# Patient Record
Sex: Female | Born: 1965 | Race: White | Hispanic: No | Marital: Married | Smoking: Never smoker
Health system: Southern US, Community
[De-identification: ages and names within clinical notes are randomized; demographics above are authoritative.]

## PROBLEM LIST (undated history)

## (undated) DIAGNOSIS — D649 Anemia, unspecified: Secondary | ICD-10-CM

## (undated) DIAGNOSIS — I361 Nonrheumatic tricuspid (valve) insufficiency: Secondary | ICD-10-CM

## (undated) DIAGNOSIS — G4733 Obstructive sleep apnea (adult) (pediatric): Secondary | ICD-10-CM

## (undated) DIAGNOSIS — Z5189 Encounter for other specified aftercare: Secondary | ICD-10-CM

## (undated) DIAGNOSIS — I10 Essential (primary) hypertension: Secondary | ICD-10-CM

## (undated) DIAGNOSIS — G43909 Migraine, unspecified, not intractable, without status migrainosus: Secondary | ICD-10-CM

## (undated) DIAGNOSIS — G709 Myoneural disorder, unspecified: Secondary | ICD-10-CM

## (undated) DIAGNOSIS — C189 Malignant neoplasm of colon, unspecified: Secondary | ICD-10-CM

## (undated) DIAGNOSIS — G629 Polyneuropathy, unspecified: Secondary | ICD-10-CM

## (undated) DIAGNOSIS — F32A Depression, unspecified: Secondary | ICD-10-CM

## (undated) DIAGNOSIS — K760 Fatty (change of) liver, not elsewhere classified: Secondary | ICD-10-CM

## (undated) DIAGNOSIS — F4310 Post-traumatic stress disorder, unspecified: Secondary | ICD-10-CM

## (undated) DIAGNOSIS — G4730 Sleep apnea, unspecified: Secondary | ICD-10-CM

## (undated) DIAGNOSIS — K921 Melena: Secondary | ICD-10-CM

## (undated) DIAGNOSIS — Z973 Presence of spectacles and contact lenses: Secondary | ICD-10-CM

## (undated) DIAGNOSIS — R011 Cardiac murmur, unspecified: Secondary | ICD-10-CM

## (undated) DIAGNOSIS — Z9889 Other specified postprocedural states: Secondary | ICD-10-CM

## (undated) DIAGNOSIS — E559 Vitamin D deficiency, unspecified: Secondary | ICD-10-CM

## (undated) DIAGNOSIS — C20 Malignant neoplasm of rectum: Secondary | ICD-10-CM

## (undated) DIAGNOSIS — Z933 Colostomy status: Secondary | ICD-10-CM

## (undated) DIAGNOSIS — E890 Postprocedural hypothyroidism: Secondary | ICD-10-CM

## (undated) DIAGNOSIS — J45909 Unspecified asthma, uncomplicated: Secondary | ICD-10-CM

## (undated) HISTORY — PX: COLON SURGERY: SHX602

## (undated) HISTORY — PX: ABDOMINAL HYSTERECTOMY: SHX81

## (undated) HISTORY — DX: Encounter for other specified aftercare: Z51.89

## (undated) HISTORY — DX: Sleep apnea, unspecified: G47.30

## (undated) HISTORY — PX: PORTACATH PLACEMENT: SHX2246

## (undated) HISTORY — DX: Unspecified asthma, uncomplicated: J45.909

## (undated) HISTORY — DX: Malignant neoplasm of rectum: C20

## (undated) HISTORY — DX: Post-traumatic stress disorder, unspecified: F43.10

## (undated) HISTORY — DX: Other specified postprocedural states: Z98.890

## (undated) HISTORY — DX: Migraine, unspecified, not intractable, without status migrainosus: G43.909

## (undated) HISTORY — DX: Myoneural disorder, unspecified: G70.9

## (undated) HISTORY — DX: Malignant neoplasm of colon, unspecified: C18.9

## (undated) HISTORY — DX: Essential (primary) hypertension: I10

## (undated) HISTORY — DX: Melena: K92.1

---

## 1997-12-27 ENCOUNTER — Inpatient Hospital Stay (HOSPITAL_COMMUNITY): Admission: AD | Admit: 1997-12-27 | Discharge: 1997-12-29 | Payer: Self-pay | Admitting: *Deleted

## 1998-02-10 ENCOUNTER — Inpatient Hospital Stay (HOSPITAL_COMMUNITY): Admission: AD | Admit: 1998-02-10 | Discharge: 1998-02-12 | Payer: Self-pay | Admitting: Obstetrics and Gynecology

## 2002-03-16 ENCOUNTER — Other Ambulatory Visit: Admission: RE | Admit: 2002-03-16 | Discharge: 2002-03-16 | Payer: Self-pay | Admitting: Obstetrics and Gynecology

## 2010-07-22 HISTORY — PX: CHOLECYSTECTOMY: SHX55

## 2010-07-22 HISTORY — PX: ERCP: SHX60

## 2010-10-12 ENCOUNTER — Inpatient Hospital Stay: Payer: Self-pay | Admitting: Emergency Medicine

## 2010-10-17 ENCOUNTER — Ambulatory Visit: Payer: Self-pay | Admitting: General Surgery

## 2012-04-14 ENCOUNTER — Encounter: Payer: Self-pay | Admitting: Internal Medicine

## 2012-04-14 ENCOUNTER — Ambulatory Visit (INDEPENDENT_AMBULATORY_CARE_PROVIDER_SITE_OTHER): Payer: Self-pay | Admitting: Internal Medicine

## 2012-04-14 VITALS — BP 120/70 | HR 94 | Temp 98.5°F | Ht 65.0 in | Wt 211.5 lb

## 2012-04-14 DIAGNOSIS — K649 Unspecified hemorrhoids: Secondary | ICD-10-CM

## 2012-04-14 DIAGNOSIS — G4700 Insomnia, unspecified: Secondary | ICD-10-CM

## 2012-04-14 DIAGNOSIS — I10 Essential (primary) hypertension: Secondary | ICD-10-CM

## 2012-04-14 DIAGNOSIS — F329 Major depressive disorder, single episode, unspecified: Secondary | ICD-10-CM | POA: Insufficient documentation

## 2012-04-14 DIAGNOSIS — R197 Diarrhea, unspecified: Secondary | ICD-10-CM

## 2012-04-14 DIAGNOSIS — Z23 Encounter for immunization: Secondary | ICD-10-CM

## 2012-04-14 DIAGNOSIS — F341 Dysthymic disorder: Secondary | ICD-10-CM

## 2012-04-14 DIAGNOSIS — Z1239 Encounter for other screening for malignant neoplasm of breast: Secondary | ICD-10-CM

## 2012-04-14 DIAGNOSIS — Z0000 Encounter for general adult medical examination without abnormal findings: Secondary | ICD-10-CM

## 2012-04-14 LAB — COMPREHENSIVE METABOLIC PANEL
BUN: 12 mg/dL (ref 6–23)
CO2: 25 mEq/L (ref 19–32)
Calcium: 8.8 mg/dL (ref 8.4–10.5)
Chloride: 101 mEq/L (ref 96–112)
Creatinine, Ser: 0.7 mg/dL (ref 0.4–1.2)
GFR: 98.66 mL/min (ref >60.00)
Total Bilirubin: 0.6 mg/dL (ref 0.3–1.2)

## 2012-04-14 LAB — LIPID PANEL
Cholesterol: 129 mg/dL (ref 0–200)
HDL: 40.6 mg/dL (ref >39.00)
Triglycerides: 75 mg/dL (ref 0.0–149.0)
VLDL: 15 mg/dL (ref 0.0–40.0)

## 2012-04-14 LAB — MICROALBUMIN / CREATININE URINE RATIO: Creatinine,U: 205.1 mg/dL

## 2012-04-14 MED ORDER — ZOLPIDEM TARTRATE 10 MG PO TABS: 10.0000 mg | ORAL_TABLET | Freq: Every evening | ORAL | Status: DC | PRN

## 2012-04-14 MED ORDER — AMLODIPINE BESYLATE 10 MG PO TABS: 10.0000 mg | ORAL_TABLET | Freq: Every day | ORAL | Status: DC

## 2012-04-14 MED ORDER — ALPRAZOLAM 0.25 MG PO TABS: 0.2500 mg | ORAL_TABLET | Freq: Every evening | ORAL | Status: DC | PRN

## 2012-04-14 MED ORDER — CHOLESTYRAMINE 4 G PO PACK: 1.0000 | PACK | Freq: Three times a day (TID) | ORAL | Status: DC

## 2012-04-14 MED ORDER — BUPROPION HCL ER (XL) 150 MG PO TB24: 150.0000 mg | ORAL_TABLET | Freq: Every day | ORAL | Status: DC

## 2012-04-14 NOTE — Assessment & Plan Note (Signed)
Recommended that she continue use of MiraLax. Will stop cholestyramine. She will use pre-moistened toilet paper and Tucks pads as needed. She will use Preparation H as needed. If symptoms do not improve, would favor adding Analpram.

## 2012-04-14 NOTE — Assessment & Plan Note (Addendum)
Symptoms consistent with anxiety/depression and PTSD after going through child being diagnosed with malignancy. Will set up counseling. Will start wellbutrin and use xanax prn for episodes of extreme anxiety such as when pt must return to hospital for visits. Follow up 3 weeks.

## 2012-04-14 NOTE — Progress Notes (Signed)
Subjective:    Patient ID: Tammie Robinson, female    DOB: 10-27-1965, 46 y.o.   MRN: [id number]  HPI 46 year old female presents to establish care. She reports history of hypertension for which she has been treated with amlodipine. She reports blood pressure has been well-controlled. She notes that she recently lost approximately 21 pounds in her blood pressure has been lower. She questions whether she needs to continue on amlodipine. She denies any chest pain, headache, palpitations.  She is also concerned about ongoing anxiety and depression. She reports that her 79 year old daughter was diagnosed 3 years ago with malignancy. Her daughter survived malignancy and now is cared for with visits every 2 months at a local tertiary care Center. She reports significant dysphoric mood and increased anxiety, particularly around her daughter's visits back to the hospital. She is tearful in describing this. Her husband reports he is noted she is more irritable prior to her daughter's hospital visits. In the past, she took a Xanax to help control anxiety with some improvement. She is not currently undergoing counseling.  She also reports a history of chronic constipation and hemorrhoids. She recently started MiraLax in an effort to help improve regularity of bowel movements. She reports occasional bleeding of hemorrhoids noted on the toilet paper.  Outpatient Encounter Prescriptions as of 04/14/2012  Medication Sig Dispense Refill  . amLODipine (NORVASC) 10 MG tablet Take 1 tablet (10 mg total) by mouth daily.  90 tablet  3  . DISCONTD: amLODipine (NORVASC) 10 MG tablet Take 10 mg by mouth daily.      Marland Kitchen ALPRAZolam (XANAX) 0.25 MG tablet Take 1 tablet (0.25 mg total) by mouth at bedtime as needed for anxiety.  90 tablet  3  . buPROPion (WELLBUTRIN XL) 150 MG 24 hr tablet Take 1 tablet (150 mg total) by mouth daily.  30 tablet  3  . cholestyramine (QUESTRAN) 4 G packet Take 1 packet by mouth 3 (three) times daily  with meals.  60 each  12  . zolpidem (AMBIEN) 10 MG tablet Take 1 tablet (10 mg total) by mouth at bedtime as needed for sleep.  30 tablet  3  . DISCONTD: cholestyramine (QUESTRAN) 4 G packet Take 1 packet by mouth 3 (three) times daily with meals.  60 each  12   BP 120/70  Pulse 94  Temp 98.5 F (36.9 C) (Oral)  Ht 5\' 5"  (1.651 m)  Wt 211 lb 8 oz (95.936 kg)  BMI 35.20 kg/m2  SpO2 98%  LMP 03/31/2012  Review of Systems  Constitutional: Negative for fever, chills, appetite change, fatigue and unexpected weight change.  HENT: Negative for ear pain, congestion, sore throat, trouble swallowing, neck pain, voice change and sinus pressure.   Eyes: Negative for visual disturbance.  Respiratory: Negative for cough, shortness of breath, wheezing and stridor.   Cardiovascular: Negative for chest pain, palpitations and leg swelling.  Gastrointestinal: Positive for anal bleeding and rectal pain. Negative for nausea, vomiting, abdominal pain, diarrhea, constipation, blood in stool and abdominal distention.  Genitourinary: Negative for dysuria and flank pain.  Musculoskeletal: Negative for myalgias, arthralgias and gait problem.  Skin: Negative for color change and rash.  Neurological: Negative for dizziness and headaches.  Hematological: Negative for adenopathy. Does not bruise/bleed easily.  Psychiatric/Behavioral: Positive for disturbed wake/sleep cycle and dysphoric mood. Negative for suicidal ideas. The patient is nervous/anxious.        Objective:   Physical Exam  Constitutional: She is oriented to person, place, and time. She  appears well-developed and well-nourished. No distress.  HENT:  Head: Normocephalic and atraumatic.  Right Ear: External ear normal.  Left Ear: External ear normal.  Nose: Nose normal.  Mouth/Throat: Oropharynx is clear and moist. No oropharyngeal exudate.  Eyes: Conjunctivae normal are normal. Pupils are equal, round, and reactive to light. Right eye exhibits  no discharge. Left eye exhibits no discharge. No scleral icterus.  Neck: Normal range of motion. Neck supple. No tracheal deviation present. No thyromegaly present.  Cardiovascular: Normal rate, regular rhythm, normal heart sounds and intact distal pulses.  Exam reveals no gallop and no friction rub.   No murmur heard. Pulmonary/Chest: Effort normal and breath sounds normal. No respiratory distress. She has no wheezes. She has no rales. She exhibits no tenderness.  Abdominal: Soft. Bowel sounds are normal. She exhibits no distension. There is no tenderness.  Musculoskeletal: Normal range of motion. She exhibits no edema and no tenderness.  Lymphadenopathy:    She has no cervical adenopathy.  Neurological: She is alert and oriented to person, place, and time. No cranial nerve deficit. She exhibits normal muscle tone. Coordination normal.  Skin: Skin is warm and dry. No rash noted. She is not diaphoretic. No erythema. No pallor.  Psychiatric: Her speech is normal and behavior is normal. Judgment and thought content normal. Her mood appears anxious. Cognition and memory are normal. She exhibits a depressed mood.          Assessment & Plan:

## 2012-04-14 NOTE — Assessment & Plan Note (Signed)
BP well controlled on amlodipine. Will try tapering Amlodipine to 5mg  daily over next few weeks then recheck BP. Will check renal function and urine microalbumin with labs today.

## 2012-04-14 NOTE — Assessment & Plan Note (Signed)
Likely related to anxiety. Will treat anxiety as above. Will start ambien to help with sleep. Follow up 3 weeks.

## 2012-05-05 ENCOUNTER — Encounter: Payer: Self-pay | Admitting: Internal Medicine

## 2012-05-11 ENCOUNTER — Encounter: Payer: Self-pay | Admitting: Internal Medicine

## 2012-05-11 DIAGNOSIS — K649 Unspecified hemorrhoids: Secondary | ICD-10-CM

## 2012-05-11 DIAGNOSIS — F419 Anxiety disorder, unspecified: Secondary | ICD-10-CM

## 2012-05-11 MED ORDER — HYDROCORTISONE ACE-PRAMOXINE 2.5-1 % RE CREA: TOPICAL_CREAM | Freq: Three times a day (TID) | RECTAL | Status: DC

## 2012-05-11 MED ORDER — BUPROPION HCL ER (XL) 300 MG PO TB24: 300.0000 mg | ORAL_TABLET | Freq: Every day | ORAL | Status: DC

## 2012-05-18 ENCOUNTER — Encounter: Payer: Self-pay | Admitting: Internal Medicine

## 2012-05-26 ENCOUNTER — Other Ambulatory Visit (HOSPITAL_COMMUNITY)
Admission: RE | Admit: 2012-05-26 | Discharge: 2012-05-26 | Disposition: A | Discharge status: Home / Self Care | Payer: Self-pay | Source: Ambulatory Visit | Attending: Internal Medicine | Admitting: Internal Medicine

## 2012-05-26 ENCOUNTER — Encounter: Payer: Self-pay | Admitting: Internal Medicine

## 2012-05-26 ENCOUNTER — Ambulatory Visit (INDEPENDENT_AMBULATORY_CARE_PROVIDER_SITE_OTHER): Payer: Self-pay | Admitting: Internal Medicine

## 2012-05-26 VITALS — BP 138/82 | HR 78 | Temp 98.3°F | Ht 65.0 in | Wt 205.5 lb

## 2012-05-26 DIAGNOSIS — Z0000 Encounter for general adult medical examination without abnormal findings: Secondary | ICD-10-CM | POA: Insufficient documentation

## 2012-05-26 DIAGNOSIS — Z1151 Encounter for screening for human papillomavirus (HPV): Secondary | ICD-10-CM | POA: Insufficient documentation

## 2012-05-26 DIAGNOSIS — F419 Anxiety disorder, unspecified: Secondary | ICD-10-CM

## 2012-05-26 DIAGNOSIS — Z309 Encounter for contraceptive management, unspecified: Secondary | ICD-10-CM

## 2012-05-26 DIAGNOSIS — Z01419 Encounter for gynecological examination (general) (routine) without abnormal findings: Secondary | ICD-10-CM | POA: Insufficient documentation

## 2012-05-26 DIAGNOSIS — IMO0001 Reserved for inherently not codable concepts without codable children: Secondary | ICD-10-CM

## 2012-05-26 DIAGNOSIS — F341 Dysthymic disorder: Secondary | ICD-10-CM

## 2012-05-26 LAB — HM MAMMOGRAPHY

## 2012-05-26 MED ORDER — LEVONORGESTREL-ETHINYL ESTRAD 0.1-20 MG-MCG PO TABS: 1.0000 | ORAL_TABLET | Freq: Every day | ORAL | Status: DC

## 2012-05-26 MED ORDER — FLUOXETINE HCL 20 MG PO TABS: 20.0000 mg | ORAL_TABLET | Freq: Every day | ORAL | Status: DC

## 2012-05-26 NOTE — Assessment & Plan Note (Signed)
Will start low-dose oral contraceptive pill. We discussed potential risk and benefits of oral contraceptives. Followup as needed.

## 2012-05-26 NOTE — Assessment & Plan Note (Signed)
General medical exam is normal today except as noted with vaginal dryness on pelvic exam. Patient would like to try oral contraceptive pill to help with both vaginal dryness and for contraception. Will start low-dose monophasic pill. Patient will call if any problems with the medication. Other health maintenance is up to date. Recommended annual screening for skin cancer with dermatologist but patient would like to defer for now until health insurance in place. Followup in one month.

## 2012-05-26 NOTE — Progress Notes (Signed)
Subjective:    Patient ID: Tammie Robinson, female    DOB: 1966/04/24, 46 y.o.   MRN: [id number]  HPI 46 year old female with history of anxiety/depression presents for annual exam. She reports that she is generally doing well. In regards to anxiety/depression, she reports that symptoms are well controlled with Wellbutrin however she is unable to afford this medication as it is over $120 per month. She would like to try another medication to help with symptoms. Aside from this, she reports she is feeling well. She denies any new concerns today.  Outpatient Encounter Prescriptions as of 05/26/2012  Medication Sig Dispense Refill  . ALPRAZolam (XANAX) 0.25 MG tablet Take 1 tablet (0.25 mg total) by mouth at bedtime as needed for anxiety.  90 tablet  3  . amLODipine (NORVASC) 10 MG tablet Take 1 tablet (10 mg total) by mouth daily.  90 tablet  3  . cholestyramine (QUESTRAN) 4 G packet Take 1 packet by mouth 3 (three) times daily with meals.  60 each  12  . hydrocortisone-pramoxine (ANALPRAM-HC) 2.5-1 % rectal cream Place rectally 3 (three) times daily.  30 g  0  . zolpidem (AMBIEN) 10 MG tablet Take 10 mg by mouth at bedtime as needed.      . [DISCONTINUED] buPROPion (WELLBUTRIN XL) 300 MG 24 hr tablet Take 1 tablet (300 mg total) by mouth daily.  30 tablet  3  . [DISCONTINUED] zolpidem (AMBIEN) 10 MG tablet Take 1 tablet (10 mg total) by mouth at bedtime as needed for sleep.  30 tablet  3  . FLUoxetine (PROZAC) 20 MG tablet Take 1 tablet (20 mg total) by mouth daily.  30 tablet  6  . levonorgestrel-ethinyl estradiol (AVIANE) 0.1-20 MG-MCG tablet Take 1 tablet by mouth daily.  1 Package  11  BP 138/82  Pulse 78  Temp 98.3 F (36.8 C) (Oral)  Ht 5\' 5"  (1.651 m)  Wt 205 lb 8 oz (93.214 kg)  BMI 34.20 kg/m2  SpO2 99%  LMP 05/15/2012   Review of Systems  Constitutional: Negative for fever, chills, appetite change, fatigue and unexpected weight change.  HENT: Negative for ear pain, congestion,  sore throat, trouble swallowing, neck pain, voice change and sinus pressure.   Eyes: Negative for visual disturbance.  Respiratory: Negative for cough, shortness of breath, wheezing and stridor.   Cardiovascular: Negative for chest pain, palpitations and leg swelling.  Gastrointestinal: Negative for nausea, vomiting, abdominal pain, diarrhea, constipation, blood in stool, abdominal distention and anal bleeding.  Genitourinary: Negative for dysuria and flank pain.  Musculoskeletal: Negative for myalgias, arthralgias and gait problem.  Skin: Negative for color change and rash.  Neurological: Negative for dizziness and headaches.  Hematological: Negative for adenopathy. Does not bruise/bleed easily.  Psychiatric/Behavioral: Negative for suicidal ideas, sleep disturbance and dysphoric mood. The patient is nervous/anxious.        Objective:   Physical Exam  Constitutional: She is oriented to person, place, and time. She appears well-developed and well-nourished. No distress.  HENT:  Head: Normocephalic and atraumatic.  Right Ear: External ear normal.  Left Ear: External ear normal.  Nose: Nose normal.  Mouth/Throat: Oropharynx is clear and moist. No oropharyngeal exudate.  Eyes: Conjunctivae normal are normal. Pupils are equal, round, and reactive to light. Right eye exhibits no discharge. Left eye exhibits no discharge. No scleral icterus.  Neck: Normal range of motion. Neck supple. No tracheal deviation present. No thyromegaly present.  Cardiovascular: Normal rate, regular rhythm, normal heart sounds and intact distal  pulses.  Exam reveals no gallop and no friction rub.   No murmur heard. Pulmonary/Chest: Effort normal and breath sounds normal. No respiratory distress. She has no wheezes. She has no rales. She exhibits no tenderness.  Abdominal: Soft. Bowel sounds are normal. She exhibits no distension and no mass. There is no tenderness. There is no rebound and no guarding.  Genitourinary:  Rectum normal, vagina normal and uterus normal. No breast swelling, tenderness, discharge or bleeding. Pelvic exam was performed with patient supine. There is no rash, tenderness or lesion on the right labia. There is no rash, tenderness or lesion on the left labia. Uterus is not enlarged and not tender. Cervix exhibits no motion tenderness, no discharge and no friability. Right adnexum displays no mass, no tenderness and no fullness. Left adnexum displays no mass, no tenderness and no fullness. No erythema or tenderness around the vagina. No vaginal discharge found.       Vaginal dryness noted, but no erythema/friability  Musculoskeletal: Normal range of motion. She exhibits no edema and no tenderness.  Lymphadenopathy:    She has no cervical adenopathy.  Neurological: She is alert and oriented to person, place, and time. No cranial nerve deficit. She exhibits normal muscle tone. Coordination normal.  Skin: Skin is warm and dry. No rash noted. She is not diaphoretic. No erythema. No pallor.  Psychiatric: She has a normal mood and affect. Her behavior is normal. Judgment and thought content normal.          Assessment & Plan:

## 2012-05-26 NOTE — Assessment & Plan Note (Signed)
Symptoms are well controlled with Wellbutrin however medication is cost prohibitive. Will try changing to Prozac. Patient will followup in one month for recheck. She will call sooner if any problems with medication.

## 2012-06-25 ENCOUNTER — Ambulatory Visit (INDEPENDENT_AMBULATORY_CARE_PROVIDER_SITE_OTHER): Payer: Self-pay | Admitting: Internal Medicine

## 2012-06-25 ENCOUNTER — Encounter: Payer: Self-pay | Admitting: Internal Medicine

## 2012-06-25 VITALS — BP 134/80 | HR 79 | Temp 98.3°F | Resp 16 | Wt 203.5 lb

## 2012-06-25 DIAGNOSIS — F329 Major depressive disorder, single episode, unspecified: Secondary | ICD-10-CM

## 2012-06-25 DIAGNOSIS — M5430 Sciatica, unspecified side: Secondary | ICD-10-CM

## 2012-06-25 DIAGNOSIS — R87615 Unsatisfactory cytologic smear of cervix: Secondary | ICD-10-CM | POA: Insufficient documentation

## 2012-06-25 DIAGNOSIS — F341 Dysthymic disorder: Secondary | ICD-10-CM

## 2012-06-25 DIAGNOSIS — M5431 Sciatica, right side: Secondary | ICD-10-CM | POA: Insufficient documentation

## 2012-06-25 MED ORDER — ALPRAZOLAM 0.25 MG PO TABS: 0.2500 mg | ORAL_TABLET | Freq: Three times a day (TID) | ORAL | Status: DC | PRN

## 2012-06-25 MED ORDER — FLUOXETINE HCL 40 MG PO CAPS: 40.0000 mg | ORAL_CAPSULE | Freq: Every day | ORAL | Status: DC

## 2012-06-25 NOTE — Assessment & Plan Note (Signed)
Symptoms consistent with right sided sciatic pain. Discussed stretching exercises and use of Ibuprofen 800mg  po tid prn. Also discussed use of prednisone if symptoms are persistent. Follow up 1 month and prn.

## 2012-06-25 NOTE — Progress Notes (Signed)
Subjective:    Patient ID: Tammie Robinson, female    DOB: October 28, 1965, 46 y.o.   MRN: [id number]  HPI 46 year old female with history of hypertension, anxiety/depression presents for followup. At her last visit, we started Prozac in effort to help with symptoms of anxiety and depression. She reports no change in symptoms with use of this medication. She denies any side effects from the medication.  Only new concern today is several week history of intermittent pain in the right posterior upper thigh which radiates down her right thigh. She reports this was diagnosed as sciatic pain in the past. She is not currently taking any medication except for occasional Advil for pain with some improvement. She denies any weakness in her leg, change in sensation in her leg.  Outpatient Encounter Prescriptions as of 06/25/2012  Medication Sig Dispense Refill  . ALPRAZolam (XANAX) 0.25 MG tablet Take 1 tablet (0.25 mg total) by mouth 3 (three) times daily as needed for anxiety.  90 tablet  3  . amLODipine (NORVASC) 10 MG tablet Take 1 tablet (10 mg total) by mouth daily.  90 tablet  3  . cholestyramine (QUESTRAN) 4 G packet Take 1 packet by mouth 3 (three) times daily with meals.  60 each  12  . hydrocortisone-pramoxine (ANALPRAM-HC) 2.5-1 % rectal cream Place rectally 3 (three) times daily.  30 g  0  . levonorgestrel-ethinyl estradiol (AVIANE) 0.1-20 MG-MCG tablet Take 1 tablet by mouth daily.  1 Package  11  . zolpidem (AMBIEN) 10 MG tablet Take 10 mg by mouth at bedtime as needed.      . [DISCONTINUED] ALPRAZolam (XANAX) 0.25 MG tablet Take 1 tablet (0.25 mg total) by mouth at bedtime as needed for anxiety.  90 tablet  3  . [DISCONTINUED] FLUoxetine (PROZAC) 20 MG tablet Take 1 tablet (20 mg total) by mouth daily.  30 tablet  6  . FLUoxetine (PROZAC) 40 MG capsule Take 1 capsule (40 mg total) by mouth daily.  30 capsule  6   BP 134/80  Pulse 79  Temp 98.3 F (36.8 C) (Oral)  Resp 16  Wt 203 lb 8 oz  (92.307 kg)  LMP 06/18/2012  Review of Systems  Constitutional: Negative for fever, chills, appetite change, fatigue and unexpected weight change.  HENT: Negative for ear pain, congestion, sore throat, trouble swallowing, neck pain, voice change and sinus pressure.   Eyes: Negative for visual disturbance.  Respiratory: Negative for cough, shortness of breath, wheezing and stridor.   Cardiovascular: Negative for chest pain, palpitations and leg swelling.  Gastrointestinal: Negative for nausea, vomiting, abdominal pain, diarrhea, constipation, blood in stool, abdominal distention and anal bleeding.  Genitourinary: Negative for dysuria and flank pain.  Musculoskeletal: Positive for myalgias and arthralgias. Negative for gait problem.  Skin: Negative for color change and rash.  Neurological: Negative for dizziness and headaches.  Hematological: Negative for adenopathy. Does not bruise/bleed easily.  Psychiatric/Behavioral: Positive for dysphoric mood. Negative for suicidal ideas and sleep disturbance. The patient is nervous/anxious.        Objective:   Physical Exam  Constitutional: She is oriented to person, place, and time. She appears well-developed and well-nourished. No distress.  HENT:  Head: Normocephalic and atraumatic.  Right Ear: External ear normal.  Left Ear: External ear normal.  Nose: Nose normal.  Mouth/Throat: Oropharynx is clear and moist. No oropharyngeal exudate.  Eyes: Conjunctivae normal are normal. Pupils are equal, round, and reactive to light. Right eye exhibits no discharge. Left eye exhibits no  discharge. No scleral icterus.  Neck: Normal range of motion. Neck supple. No tracheal deviation present. No thyromegaly present.  Cardiovascular: Normal rate, regular rhythm, normal heart sounds and intact distal pulses.  Exam reveals no gallop and no friction rub.   No murmur heard. Pulmonary/Chest: Effort normal and breath sounds normal. No respiratory distress. She has  no wheezes. She has no rales. She exhibits no tenderness.  Musculoskeletal: Normal range of motion. She exhibits no edema and no tenderness.       Back:  Lymphadenopathy:    She has no cervical adenopathy.  Neurological: She is alert and oriented to person, place, and time. No cranial nerve deficit. She exhibits normal muscle tone. Coordination normal.  Skin: Skin is warm and dry. No rash noted. She is not diaphoretic. No erythema. No pallor.  Psychiatric: She has a normal mood and affect. Her behavior is normal. Judgment and thought content normal.          Assessment & Plan:

## 2012-06-25 NOTE — Assessment & Plan Note (Signed)
Symptoms unchanged on Fluoxetine 20mg  dose. Will try increasing to 40mg  daily. Follow up in 1 month.

## 2012-06-25 NOTE — Assessment & Plan Note (Signed)
Recent PAP had insufficient cells for evaluation. However, HPV was negative. Will plan to repeat PAP at visit in Jan 2014

## 2012-08-10 ENCOUNTER — Ambulatory Visit: Payer: Self-pay | Admitting: Internal Medicine

## 2012-09-09 ENCOUNTER — Other Ambulatory Visit: Payer: Self-pay | Admitting: Internal Medicine

## 2012-09-11 ENCOUNTER — Encounter: Payer: Self-pay | Admitting: Internal Medicine

## 2012-09-17 MED ORDER — ZOLPIDEM TARTRATE 10 MG PO TABS: 10.0000 mg | ORAL_TABLET | Freq: Every evening | ORAL | Status: DC | PRN

## 2012-09-18 NOTE — Telephone Encounter (Signed)
faxed

## 2012-11-20 ENCOUNTER — Telehealth: Payer: Self-pay | Admitting: *Deleted

## 2012-11-20 ENCOUNTER — Encounter: Payer: Self-pay | Admitting: Internal Medicine

## 2012-11-20 DIAGNOSIS — F329 Major depressive disorder, single episode, unspecified: Secondary | ICD-10-CM

## 2012-11-20 MED ORDER — ALPRAZOLAM 0.25 MG PO TABS: 0.2500 mg | ORAL_TABLET | Freq: Three times a day (TID) | ORAL | Status: DC | PRN

## 2012-11-20 NOTE — Telephone Encounter (Signed)
Ok to refill patient Xanax. 

## 2012-11-20 NOTE — Telephone Encounter (Signed)
Rx printed, signed and faxed.

## 2012-11-20 NOTE — Telephone Encounter (Signed)
Fine to refill x1 

## 2013-02-08 ENCOUNTER — Other Ambulatory Visit: Payer: Self-pay | Admitting: *Deleted

## 2013-02-08 ENCOUNTER — Encounter: Payer: Self-pay | Admitting: Internal Medicine

## 2013-02-08 DIAGNOSIS — F329 Major depressive disorder, single episode, unspecified: Secondary | ICD-10-CM

## 2013-02-09 MED ORDER — ALPRAZOLAM 0.25 MG PO TABS: 0.2500 mg | ORAL_TABLET | Freq: Three times a day (TID) | ORAL | Status: DC | PRN

## 2013-03-31 ENCOUNTER — Other Ambulatory Visit: Payer: Self-pay | Admitting: *Deleted

## 2013-04-01 MED ORDER — ZOLPIDEM TARTRATE 10 MG PO TABS: 10.0000 mg | ORAL_TABLET | Freq: Every evening | ORAL | Status: DC | PRN

## 2013-05-02 ENCOUNTER — Encounter: Payer: Self-pay | Admitting: Internal Medicine

## 2013-05-02 DIAGNOSIS — F329 Major depressive disorder, single episode, unspecified: Secondary | ICD-10-CM

## 2013-05-03 ENCOUNTER — Other Ambulatory Visit: Payer: Self-pay | Admitting: Internal Medicine

## 2013-05-03 ENCOUNTER — Other Ambulatory Visit: Payer: Self-pay | Admitting: *Deleted

## 2013-05-03 DIAGNOSIS — F329 Major depressive disorder, single episode, unspecified: Secondary | ICD-10-CM

## 2013-05-03 NOTE — Telephone Encounter (Signed)
Needs a follow up visit, as has not been seen in >6 months.

## 2013-05-03 NOTE — Telephone Encounter (Signed)
Ok to refill??      Xanax 

## 2013-05-03 NOTE — Telephone Encounter (Signed)
Eprescribed.

## 2013-05-06 MED ORDER — ALPRAZOLAM 0.25 MG PO TABS: 0.2500 mg | ORAL_TABLET | Freq: Three times a day (TID) | ORAL | Status: DC | PRN

## 2013-05-27 ENCOUNTER — Other Ambulatory Visit: Payer: Self-pay

## 2013-06-14 ENCOUNTER — Encounter: Payer: Self-pay | Admitting: Internal Medicine

## 2013-06-14 DIAGNOSIS — F329 Major depressive disorder, single episode, unspecified: Secondary | ICD-10-CM

## 2013-06-15 MED ORDER — ALPRAZOLAM 0.25 MG PO TABS: 0.2500 mg | ORAL_TABLET | Freq: Three times a day (TID) | ORAL | Status: DC | PRN

## 2013-06-15 MED ORDER — FLUOXETINE HCL 40 MG PO CAPS: ORAL_CAPSULE | ORAL | Status: DC

## 2013-07-06 ENCOUNTER — Other Ambulatory Visit: Payer: Self-pay | Admitting: Internal Medicine

## 2013-07-30 ENCOUNTER — Other Ambulatory Visit: Payer: Self-pay | Admitting: Internal Medicine

## 2013-07-30 NOTE — Telephone Encounter (Signed)
Last visit 07/05/12, no appointment scheduled

## 2013-08-02 ENCOUNTER — Other Ambulatory Visit: Payer: Self-pay | Admitting: Internal Medicine

## 2013-08-04 ENCOUNTER — Other Ambulatory Visit: Payer: Self-pay | Admitting: Internal Medicine

## 2013-12-20 ENCOUNTER — Ambulatory Visit: Payer: Self-pay | Admitting: Hematology and Oncology

## 2013-12-20 HISTORY — PX: PORTACATH PLACEMENT: SHX2246

## 2013-12-20 HISTORY — PX: COLONOSCOPY WITH ESOPHAGOGASTRODUODENOSCOPY (EGD): SHX5779

## 2013-12-22 ENCOUNTER — Telehealth: Payer: Self-pay

## 2013-12-22 ENCOUNTER — Ambulatory Visit (INDEPENDENT_AMBULATORY_CARE_PROVIDER_SITE_OTHER): Payer: Medicaid Other | Admitting: Adult Health

## 2013-12-22 ENCOUNTER — Encounter: Payer: Self-pay | Admitting: Adult Health

## 2013-12-22 ENCOUNTER — Inpatient Hospital Stay: Payer: Self-pay | Admitting: Specialist

## 2013-12-22 VITALS — BP 118/70 | HR 102 | Temp 98.3°F | Resp 16 | Ht 65.0 in | Wt 192.0 lb

## 2013-12-22 DIAGNOSIS — C20 Malignant neoplasm of rectum: Secondary | ICD-10-CM

## 2013-12-22 DIAGNOSIS — R509 Fever, unspecified: Secondary | ICD-10-CM | POA: Insufficient documentation

## 2013-12-22 DIAGNOSIS — R300 Dysuria: Secondary | ICD-10-CM

## 2013-12-22 HISTORY — DX: Malignant neoplasm of rectum: C20

## 2013-12-22 LAB — POCT URINALYSIS DIPSTICK
Bilirubin, UA: NEGATIVE
GLUCOSE UA: NEGATIVE
Ketones, UA: NEGATIVE
NITRITE UA: POSITIVE
PROTEIN UA: 100
Spec Grav, UA: 1.02
UROBILINOGEN UA: 0.2
pH, UA: 6

## 2013-12-22 LAB — CBC WITH DIFFERENTIAL/PLATELET
BASOS PCT: 0.1 % (ref 0.0–3.0)
Basophil #: 0 10*3/uL (ref 0.0–0.1)
Basophil %: 0.1 %
Basophils Absolute: 0 10*3/uL (ref 0.0–0.1)
EOS ABS: 0 10*3/uL (ref 0.0–0.7)
EOS PCT: 0.1 %
Eosinophil #: 0 10*3/uL (ref 0.0–0.7)
Eosinophils Relative: 0.1 % (ref 0.0–5.0)
HCT: 14.5 % — CL (ref 36.0–46.0)
HCT: 14.8 % — AB (ref 35.0–47.0)
HGB: 4.1 g/dL — CL (ref 12.0–16.0)
Hemoglobin: 4.2 g/dL — CL (ref 12.0–15.0)
LYMPHS ABS: 1 10*3/uL (ref 0.7–4.0)
LYMPHS ABS: 0.7 10*3/uL — AB (ref 1.0–3.6)
Lymphocyte %: 4 %
Lymphocytes Relative: 4.9 % — ABNORMAL LOW (ref 12.0–46.0)
MCH: 14.9 pg — ABNORMAL LOW (ref 26.0–34.0)
MCHC: 27.9 g/dL — AB (ref 32.0–36.0)
MCV: 53 fL — AB (ref 80–100)
MONO ABS: 1.4 10*3/uL — AB (ref 0.1–1.0)
MONO ABS: 1.2 x10 3/mm — AB (ref 0.2–0.9)
MONOS PCT: 6.6 %
Monocytes Relative: 7.1 % (ref 3.0–12.0)
Neutro Abs: 17.6 10*3/uL — ABNORMAL HIGH (ref 1.4–7.7)
Neutrophil #: 16.4 10*3/uL — ABNORMAL HIGH (ref 1.4–6.5)
Neutrophil %: 89.2 %
PLATELETS: 719 10*3/uL — AB (ref 150–440)
Platelets: 757 10*3/uL — ABNORMAL HIGH (ref 150.0–400.0)
RBC: 2.68 Mil/uL — ABNORMAL LOW (ref 3.87–5.11)
RBC: 2.79 10*6/uL — ABNORMAL LOW (ref 3.80–5.20)
RDW: 19.7 % — ABNORMAL HIGH (ref 11.5–15.5)
RDW: 19.1 % — AB (ref 11.5–14.5)
WBC: 20 10*3/uL (ref 4.0–10.5)
WBC: 18.4 10*3/uL — ABNORMAL HIGH (ref 3.6–11.0)

## 2013-12-22 LAB — COMPREHENSIVE METABOLIC PANEL
ALBUMIN: 1.6 g/dL — AB (ref 3.4–5.0)
ALK PHOS: 113 U/L
ALT: 10 U/L (ref 0–35)
ALT: 10 U/L — AB (ref 12–78)
AST: 15 U/L (ref 0–37)
AST: 18 U/L (ref 15–37)
Albumin: 1.7 g/dL — ABNORMAL LOW (ref 3.5–5.2)
Alkaline Phosphatase: 85 U/L (ref 39–117)
Anion Gap: 8 (ref 7–16)
BUN: 7 mg/dL (ref 6–23)
BUN: 7 mg/dL (ref 7–18)
Bilirubin,Total: 0.5 mg/dL (ref 0.2–1.0)
CHLORIDE: 102 mmol/L (ref 98–107)
CO2: 25 mEq/L (ref 19–32)
CO2: 26 mmol/L (ref 21–32)
CREATININE: 0.7 mg/dL (ref 0.4–1.2)
Calcium, Total: 7.4 mg/dL — ABNORMAL LOW (ref 8.5–10.1)
Calcium: 7.1 mg/dL — ABNORMAL LOW (ref 8.4–10.5)
Chloride: 98 mEq/L (ref 96–112)
Creatinine: 0.92 mg/dL (ref 0.60–1.30)
EGFR (African American): >60
EGFR (Non-African Amer.): >60
GFR: 94.73 mL/min (ref >60.00)
GLUCOSE: 92 mg/dL (ref 70–99)
Glucose: 98 mg/dL (ref 65–99)
OSMOLALITY: 270 (ref 275–301)
POTASSIUM: 3.1 mmol/L — AB (ref 3.5–5.1)
Potassium: 3.2 mEq/L — ABNORMAL LOW (ref 3.5–5.1)
Sodium: 131 mEq/L — ABNORMAL LOW (ref 135–145)
Sodium: 136 mmol/L (ref 136–145)
Total Bilirubin: 0.8 mg/dL (ref 0.2–1.2)
Total Protein: 5.5 g/dL — ABNORMAL LOW (ref 6.0–8.3)
Total Protein: 6.4 g/dL (ref 6.4–8.2)

## 2013-12-22 LAB — IRON AND TIBC
Iron Bind.Cap.(Total): 185 ug/dL — ABNORMAL LOW
Iron Saturation: 5 %
Iron: 10 ug/dL — ABNORMAL LOW
Unbound Iron-Bind.Cap.: 175 ug/dL

## 2013-12-22 LAB — URINALYSIS, COMPLETE
Bilirubin,UR: NEGATIVE
Glucose,UR: NEGATIVE mg/dL (ref 0–75)
Nitrite: POSITIVE
Ph: 5 (ref 4.5–8.0)
RBC,UR: 11 /HPF (ref 0–5)
Specific Gravity: 1.015 (ref 1.003–1.030)
Squamous Epithelial: 6
WBC UR: 858 /HPF (ref 0–5)

## 2013-12-22 LAB — FOLATE: Folic Acid: 3.9 ng/mL

## 2013-12-22 LAB — HEMOGLOBIN: HGB: 5.4 g/dL — ABNORMAL LOW

## 2013-12-22 LAB — FERRITIN: Ferritin (ARMC): 31 ng/mL (ref 8–388)

## 2013-12-22 MED ORDER — CIPROFLOXACIN HCL 250 MG PO TABS: 250.0000 mg | ORAL_TABLET | Freq: Two times a day (BID) | ORAL | Status: DC

## 2013-12-22 MED ORDER — PHENAZOPYRIDINE HCL 200 MG PO TABS: 200.0000 mg | ORAL_TABLET | Freq: Three times a day (TID) | ORAL | Status: DC | PRN

## 2013-12-22 NOTE — Telephone Encounter (Signed)
Spoke to patient, notified her that some of her labs were critical and told the patient to go to the ED [facility]) right now. Patient verbalized understanding. Called [facility] ED to notify charge nurse [facility]) patient is on the way and the reason for her visit. Also faxed Raquel's notes from today's office visit and Lab results.

## 2013-12-22 NOTE — Progress Notes (Signed)
Pre visit review using our clinic review tool, if applicable. No additional management support is needed unless otherwise documented below in the visit note. 

## 2013-12-22 NOTE — Progress Notes (Signed)
Patient ID: Tammie Robinson, female   DOB: Nov 07, 1965, 48 y.o.   MRN: [id number]   Subjective:    Patient ID: Tammie Robinson, female    DOB: [birth year], 48 y.o.   MRN: [id number]  HPI  48 y/o female pt of Dr. Gilford Rile who was last seen in clinic in 2013 presents with report of intermittent fever, chills, post nasal drip that began ~ 12/12/13. She improved for a while but reports symptoms returned this past weekend. On Monday she began to experience dysuria. Denies shortness of breath, chest pain. She reports fever but has not taken her temp. States she feels when she has a fever. Denies any insect bites. Denies n/v/d. Denies abdominal pain. Denies blood in urine or stool. Appetite is decreased. Taking liquids well.   Past Medical History  Diagnosis Date  . Blood in stool   . Hypertension   . Migraine   . Asthma     seasonal,never hospitalized    Current Outpatient Prescriptions on File Prior to Visit  Medication Sig Dispense Refill  . amLODipine (NORVASC) 10 MG tablet take 1 tablet by mouth once daily  90 tablet  0  . ALPRAZolam (XANAX) 0.25 MG tablet Take 1 tablet (0.25 mg total) by mouth 3 (three) times daily as needed for anxiety.  90 tablet  0  . cholestyramine (QUESTRAN) 4 G packet Take 1 packet by mouth 3 (three) times daily with meals.  60 each  12  . FLUoxetine (PROZAC) 40 MG capsule Take one capsule by mouth one time daily  30 capsule  1  . hydrocortisone-pramoxine (ANALPRAM-HC) 2.5-1 % rectal cream Place rectally 3 (three) times daily.  30 g  0  . levonorgestrel-ethinyl estradiol (AVIANE) 0.1-20 MG-MCG tablet Take 1 tablet by mouth daily.  1 Package  11  . zolpidem (AMBIEN) 10 MG tablet Take 1 tablet (10 mg total) by mouth at bedtime as needed.  30 tablet  3   No current facility-administered medications on file prior to visit.     Review of Systems  Constitutional: Positive for fever and chills.  HENT: Positive for postnasal drip.   Respiratory: Positive for cough. Negative for  chest tightness, shortness of breath and wheezing.   Cardiovascular: Negative for chest pain.  Gastrointestinal: Negative for nausea, vomiting, abdominal pain, diarrhea and blood in stool.  Genitourinary: Positive for dysuria. Negative for hematuria, flank pain, difficulty urinating and pelvic pain.  Neurological: Positive for weakness. Negative for light-headedness and headaches.  Psychiatric/Behavioral: Negative for behavioral problems. The patient is not nervous/anxious.        Objective:  BP 118/70  Pulse 102  Temp(Src) 98.3 F (36.8 C) (Oral)  Resp 16  Ht 5\' 5"  (1.651 m)  Wt 192 lb (87.091 kg)  BMI 31.95 kg/m2  SpO2 97%    Physical Exam  Constitutional: She is oriented to person, place, and time.  Appears acutely ill. Very pale  HENT:  Head: Normocephalic and atraumatic.  Neck: Neck supple.  Cardiovascular: Normal rate, regular rhythm and normal heart sounds.  Exam reveals no gallop.   No murmur heard. Pulmonary/Chest: Effort normal and breath sounds normal. No respiratory distress. She has no wheezes. She has no rales.  Abdominal: Soft. Bowel sounds are normal. She exhibits no distension and no mass. There is no tenderness. There is no rebound and no guarding.  Musculoskeletal: Normal range of motion.  Neurological: She is alert and oriented to person, place, and time.  Skin:  Lower back mottled skin. Pt reports that  it is from heating pad  Psychiatric: She has a normal mood and affect. Her behavior is normal. Judgment and thought content normal.       Assessment & Plan:   1. Fever Subjective reports of fever. None in clinic. Pt appears acutely ill and very pale. Will check labs. Follow up with Dr. Gilford Rile in ~ 1 week or sooner if necessary. - CBC with Differential - Comprehensive metabolic panel  2. Dysuria UA positive for nitrites, blood and leukocytes. Send urine for culture. Start Cipro as directed. Pyridium for bladder pain/spasms. Push fluids. - POCT  urinalysis dipstick

## 2013-12-22 NOTE — Patient Instructions (Signed)
  Start cipro 250 mg twice daily for 7 days. Take with food if it upsets your stomach.  Pyridium for bladder pain. This will turn your urine orange.  Call if no improvement within 4-5 days or sooner if necessary.  Drink plenty of fluids and rest.  I will call you with results from blood work and culture once they are available.

## 2013-12-23 ENCOUNTER — Telehealth: Payer: Self-pay | Admitting: *Deleted

## 2013-12-23 LAB — CBC WITH DIFFERENTIAL/PLATELET
BASOS PCT: 0.4 %
Basophil #: 0.1 10*3/uL (ref 0.0–0.1)
EOS PCT: 0.1 %
Eosinophil #: 0 10*3/uL (ref 0.0–0.7)
HCT: 23.4 % — ABNORMAL LOW (ref 35.0–47.0)
HGB: 7.2 g/dL — AB (ref 12.0–16.0)
LYMPHS ABS: 0.7 10*3/uL — AB (ref 1.0–3.6)
LYMPHS PCT: 3.7 %
MCH: 20.3 pg — AB (ref 26.0–34.0)
MCHC: 30.9 g/dL — AB (ref 32.0–36.0)
MCV: 66 fL — AB (ref 80–100)
MONO ABS: 1.3 x10 3/mm — AB (ref 0.2–0.9)
Monocyte %: 7.1 %
Neutrophil #: 16.1 10*3/uL — ABNORMAL HIGH (ref 1.4–6.5)
Neutrophil %: 88.7 %
PLATELETS: 567 10*3/uL — AB (ref 150–440)
RBC: 3.56 10*6/uL — AB (ref 3.80–5.20)
RDW: 32.1 % — AB (ref 11.5–14.5)
WBC: 18.1 10*3/uL — ABNORMAL HIGH (ref 3.6–11.0)

## 2013-12-23 NOTE — Telephone Encounter (Signed)
Pt in hospital, spoke with Dr. Verdell Carmine who is seeing the patient for fever, requested urine culture to be faxed to [place] [phone] as soon as it is resulted

## 2013-12-24 LAB — CBC WITH DIFFERENTIAL/PLATELET
BASOS PCT: 0.2 %
Basophil #: 0 10*3/uL (ref 0.0–0.1)
EOS ABS: 0 10*3/uL (ref 0.0–0.7)
EOS PCT: 0 %
HCT: 22.6 % — ABNORMAL LOW (ref 35.0–47.0)
HGB: 6.9 g/dL — AB (ref 12.0–16.0)
Lymphocyte #: 1 10*3/uL (ref 1.0–3.6)
Lymphocyte %: 6.1 %
MCH: 20.2 pg — ABNORMAL LOW (ref 26.0–34.0)
MCHC: 30.5 g/dL — ABNORMAL LOW (ref 32.0–36.0)
MCV: 66 fL — ABNORMAL LOW (ref 80–100)
MONO ABS: 1.2 x10 3/mm — AB (ref 0.2–0.9)
Monocyte %: 7.3 %
NEUTROS ABS: 13.7 10*3/uL — AB (ref 1.4–6.5)
Neutrophil %: 86.4 %
PLATELETS: 584 10*3/uL — AB (ref 150–440)
RBC: 3.41 10*6/uL — AB (ref 3.80–5.20)
RDW: 32.7 % — ABNORMAL HIGH (ref 11.5–14.5)
WBC: 15.9 10*3/uL — AB (ref 3.6–11.0)

## 2013-12-24 LAB — BASIC METABOLIC PANEL
ANION GAP: 9 (ref 7–16)
BUN: 8 mg/dL (ref 7–18)
CHLORIDE: 104 mmol/L (ref 98–107)
CO2: 22 mmol/L (ref 21–32)
Calcium, Total: 7.3 mg/dL — ABNORMAL LOW (ref 8.5–10.1)
Creatinine: 0.73 mg/dL (ref 0.60–1.30)
EGFR (African American): >60
EGFR (Non-African Amer.): >60
Glucose: 86 mg/dL (ref 65–99)
OSMOLALITY: 268 (ref 275–301)
POTASSIUM: 3.5 mmol/L (ref 3.5–5.1)
Sodium: 135 mmol/L — ABNORMAL LOW (ref 136–145)

## 2013-12-24 NOTE — Telephone Encounter (Signed)
Prelim results faxed

## 2013-12-25 LAB — CBC WITH DIFFERENTIAL/PLATELET
BASOS ABS: 0 10*3/uL (ref 0.0–0.1)
Basophil %: 0.1 %
EOS PCT: 0.4 %
Eosinophil #: 0.1 10*3/uL (ref 0.0–0.7)
HCT: 24.5 % — AB (ref 35.0–47.0)
HGB: 7.5 g/dL — AB (ref 12.0–16.0)
LYMPHS PCT: 5.6 %
Lymphocyte #: 0.8 10*3/uL — ABNORMAL LOW (ref 1.0–3.6)
MCH: 21.1 pg — AB (ref 26.0–34.0)
MCHC: 30.6 g/dL — ABNORMAL LOW (ref 32.0–36.0)
MCV: 69 fL — AB (ref 80–100)
MONOS PCT: 7 %
Monocyte #: 1 x10 3/mm — ABNORMAL HIGH (ref 0.2–0.9)
Neutrophil #: 12.5 10*3/uL — ABNORMAL HIGH (ref 1.4–6.5)
Neutrophil %: 86.9 %
Platelet: 570 10*3/uL — ABNORMAL HIGH (ref 150–440)
RBC: 3.56 10*6/uL — ABNORMAL LOW (ref 3.80–5.20)
RDW: 32.3 % — ABNORMAL HIGH (ref 11.5–14.5)
WBC: 14.4 10*3/uL — ABNORMAL HIGH (ref 3.6–11.0)

## 2013-12-25 LAB — URINE CULTURE

## 2013-12-26 LAB — CBC WITH DIFFERENTIAL/PLATELET
BASOS ABS: 0 10*3/uL (ref 0.0–0.1)
Basophil %: 0.2 %
EOS ABS: 0 10*3/uL (ref 0.0–0.7)
Eosinophil %: 0.3 %
HCT: 23.7 % — ABNORMAL LOW (ref 35.0–47.0)
HGB: 7.1 g/dL — ABNORMAL LOW (ref 12.0–16.0)
LYMPHS ABS: 0.5 10*3/uL — AB (ref 1.0–3.6)
LYMPHS PCT: 4.3 %
MCH: 20.8 pg — ABNORMAL LOW (ref 26.0–34.0)
MCHC: 29.7 g/dL — ABNORMAL LOW (ref 32.0–36.0)
MCV: 70 fL — ABNORMAL LOW (ref 80–100)
MONO ABS: 0.7 x10 3/mm (ref 0.2–0.9)
Monocyte %: 5.7 %
Neutrophil #: 10.7 10*3/uL — ABNORMAL HIGH (ref 1.4–6.5)
Neutrophil %: 89.5 %
Platelet: 488 10*3/uL — ABNORMAL HIGH (ref 150–440)
RBC: 3.4 10*6/uL — AB (ref 3.80–5.20)
RDW: 33.2 % — ABNORMAL HIGH (ref 11.5–14.5)
WBC: 12 10*3/uL — AB (ref 3.6–11.0)

## 2013-12-27 ENCOUNTER — Ambulatory Visit: Payer: Self-pay | Admitting: Hematology and Oncology

## 2013-12-27 LAB — CBC WITH DIFFERENTIAL/PLATELET
Basophil #: 0 10*3/uL (ref 0.0–0.1)
Basophil %: 0.5 %
EOS PCT: 1.3 %
Eosinophil #: 0.1 10*3/uL (ref 0.0–0.7)
HCT: 23 % — ABNORMAL LOW (ref 35.0–47.0)
HGB: 6.9 g/dL — ABNORMAL LOW (ref 12.0–16.0)
Lymphocyte #: 0.6 10*3/uL — ABNORMAL LOW (ref 1.0–3.6)
Lymphocyte %: 6.4 %
MCH: 20.9 pg — AB (ref 26.0–34.0)
MCHC: 30 g/dL — ABNORMAL LOW (ref 32.0–36.0)
MCV: 70 fL — ABNORMAL LOW (ref 80–100)
Monocyte #: 0.6 x10 3/mm (ref 0.2–0.9)
Monocyte %: 6.4 %
NEUTROS ABS: 7.8 10*3/uL — AB (ref 1.4–6.5)
NEUTROS PCT: 85.4 %
Platelet: 493 10*3/uL — ABNORMAL HIGH (ref 150–440)
RBC: 3.3 10*6/uL — ABNORMAL LOW (ref 3.80–5.20)
RDW: 33.4 % — ABNORMAL HIGH (ref 11.5–14.5)
WBC: 9.1 10*3/uL (ref 3.6–11.0)

## 2013-12-27 NOTE — Telephone Encounter (Signed)
Final results faxed to Dr. Verdell Carmine at [facility]

## 2013-12-28 LAB — CBC WITH DIFFERENTIAL/PLATELET
BASOS ABS: 0 10*3/uL (ref 0.0–0.1)
BASOS PCT: 0.4 %
EOS ABS: 0.2 10*3/uL (ref 0.0–0.7)
Eosinophil %: 2.3 %
HCT: 24.9 % — ABNORMAL LOW (ref 35.0–47.0)
HGB: 7.6 g/dL — ABNORMAL LOW (ref 12.0–16.0)
LYMPHS ABS: 0.7 10*3/uL — AB (ref 1.0–3.6)
Lymphocyte %: 7.7 %
MCH: 21.8 pg — AB (ref 26.0–34.0)
MCHC: 30.5 g/dL — AB (ref 32.0–36.0)
MCV: 71 fL — AB (ref 80–100)
MONOS PCT: 7 %
Monocyte #: 0.6 x10 3/mm (ref 0.2–0.9)
Neutrophil #: 7 10*3/uL — ABNORMAL HIGH (ref 1.4–6.5)
Neutrophil %: 82.6 %
PLATELETS: 416 10*3/uL (ref 150–440)
RBC: 3.49 10*6/uL — ABNORMAL LOW (ref 3.80–5.20)
RDW: 32.2 % — ABNORMAL HIGH (ref 11.5–14.5)
WBC: 8.5 10*3/uL (ref 3.6–11.0)

## 2013-12-28 LAB — PATHOLOGY REPORT

## 2013-12-28 LAB — CULTURE, BLOOD (SINGLE)

## 2013-12-29 LAB — CEA: CEA: 15.3 ng/mL — ABNORMAL HIGH (ref 0.0–4.7)

## 2013-12-31 LAB — CANCER CENTER HEMOGLOBIN: HGB: 9.6 g/dL — AB (ref 12.0–16.0)

## 2014-01-03 ENCOUNTER — Ambulatory Visit (INDEPENDENT_AMBULATORY_CARE_PROVIDER_SITE_OTHER): Payer: Medicaid Other | Admitting: Internal Medicine

## 2014-01-03 ENCOUNTER — Encounter: Payer: Self-pay | Admitting: Internal Medicine

## 2014-01-03 VITALS — BP 124/84 | HR 97 | Temp 98.1°F | Ht 65.0 in | Wt 174.2 lb

## 2014-01-03 DIAGNOSIS — C20 Malignant neoplasm of rectum: Secondary | ICD-10-CM

## 2014-01-03 DIAGNOSIS — F419 Anxiety disorder, unspecified: Secondary | ICD-10-CM

## 2014-01-03 DIAGNOSIS — D649 Anemia, unspecified: Secondary | ICD-10-CM

## 2014-01-03 DIAGNOSIS — F341 Dysthymic disorder: Secondary | ICD-10-CM

## 2014-01-03 DIAGNOSIS — F329 Major depressive disorder, single episode, unspecified: Secondary | ICD-10-CM

## 2014-01-03 DIAGNOSIS — I10 Essential (primary) hypertension: Secondary | ICD-10-CM

## 2014-01-03 MED ORDER — ALPRAZOLAM 0.25 MG PO TABS: 0.2500 mg | ORAL_TABLET | Freq: Three times a day (TID) | ORAL | Status: DC | PRN

## 2014-01-03 MED ORDER — AMLODIPINE BESYLATE 5 MG PO TABS: 5.0000 mg | ORAL_TABLET | Freq: Every day | ORAL | Status: DC

## 2014-01-03 NOTE — Assessment & Plan Note (Signed)
Hgb 4 in setting of rectal bleeding. Repeat Hgb last Friday 9.6 per pt report. Will continue Hemocyte. Follow up for recheck CBC with hematology this week.

## 2014-01-03 NOTE — Progress Notes (Signed)
Subjective:    Patient ID: Tammie Robinson, female    DOB: 09-Dec-1965, 48 y.o.   MRN: [id number]  HPI 48YO female presents for follow up. Recently, seen in clinic. Labs showed Hgb 4. Sent to ED. Admitted [facility] 6/3-12/28/2013. Evaluation showed rectal cancer with GI bleeding causing anemia. Lesion was metastatic to lymph nodes. Recheck of Hgb last Friday 9.6. Starts simulation for radiation tomorrow. Then, will be followed by chemotherapy and then surgical resection of primary mass. Coping well. Has strong support from family. Planning to take a trip to the beach in a couple of weeks. No pain, nausea, diarrhea, or other concerns.   Review of Systems  Constitutional: Positive for fatigue and unexpected weight change. Negative for fever, chills and appetite change.  HENT: Negative for congestion, ear pain, sinus pressure, sore throat, trouble swallowing and voice change.   Eyes: Negative for visual disturbance.  Respiratory: Negative for cough, shortness of breath, wheezing and stridor.   Cardiovascular: Negative for chest pain, palpitations and leg swelling.  Gastrointestinal: Positive for blood in stool and anal bleeding. Negative for nausea, vomiting, abdominal pain, diarrhea, constipation and abdominal distention.  Genitourinary: Negative for dysuria and flank pain.  Musculoskeletal: Negative for arthralgias, gait problem, myalgias and neck pain.  Skin: Negative for color change and rash.  Neurological: Negative for dizziness and headaches.  Hematological: Negative for adenopathy. Does not bruise/bleed easily.  Psychiatric/Behavioral: Negative for suicidal ideas, sleep disturbance and dysphoric mood. The patient is not nervous/anxious.        Objective:    BP 124/84  Pulse 97  Temp(Src) 98.1 F (36.7 C) (Oral)  Ht 5\' 5"  (1.651 m)  Wt 174 lb 4 oz (79.039 kg)  BMI 29.00 kg/m2  SpO2 95%  LMP 12/11/2013 Physical Exam  Constitutional: She is oriented to person, place, and time. She  appears well-developed and well-nourished. No distress.  HENT:  Head: Normocephalic and atraumatic.  Right Ear: External ear normal.  Left Ear: External ear normal.  Nose: Nose normal.  Mouth/Throat: Oropharynx is clear and moist. No oropharyngeal exudate.  Eyes: Conjunctivae are normal. Pupils are equal, round, and reactive to light. Right eye exhibits no discharge. Left eye exhibits no discharge. No scleral icterus.  Neck: Normal range of motion. Neck supple. No tracheal deviation present. No thyromegaly present.  Cardiovascular: Normal rate, regular rhythm, normal heart sounds and intact distal pulses.  Exam reveals no gallop and no friction rub.   No murmur heard. Pulmonary/Chest: Effort normal and breath sounds normal. No accessory muscle usage. Not tachypneic. No respiratory distress. She has no decreased breath sounds. She has no wheezes. She has no rhonchi. She has no rales. She exhibits no tenderness.  Abdominal: Soft. Bowel sounds are normal. She exhibits no distension and no mass. There is no tenderness. There is no rebound and no guarding.  Musculoskeletal: Normal range of motion. She exhibits no edema and no tenderness.  Lymphadenopathy:    She has no cervical adenopathy.  Neurological: She is alert and oriented to person, place, and time. No cranial nerve deficit. She exhibits normal muscle tone. Coordination normal.  Skin: Skin is warm and dry. No rash noted. She is not diaphoretic. No erythema. There is pallor.  Psychiatric: She has a normal mood and affect. Her behavior is normal. Judgment and thought content normal.          Assessment & Plan:   Problem List Items Addressed This Visit     Unprioritized   Anemia  Hgb 4 in setting of rectal bleeding. Repeat Hgb last Friday 9.6 per pt report. Will continue Hemocyte. Follow up for recheck CBC with hematology this week.    Relevant Medications      ferrous fumarate (HEMOCYTE - 106 MG FE) 325 (106 FE) MG TABS tablet     Anxiety and depression     Coping well with recent cancer diagnosis. Will continue prn alprazolam for any episodes of severe anxiety. Follow up prn.    Relevant Medications      ALPRAZolam  (XANAX) tablet   Hypertension - Primary      BP Readings from Last 3 Encounters:  01/03/14 124/84  12/22/13 118/70  06/25/12 134/80   BP well controlled with amlodipine. Will continue.     Relevant Medications      amLODIpine (NORVASC) tablet   Rectal cancer     Recently diagnosed with rectal cancer. Reviewed notes from recent hospitalization. Initial presentation with fatigue, Hgb4. No rectal pain or bleeding noted by pt. Starting XRT this week, followed by chemo. Will continue to follow.    Relevant Medications      ALPRAZolam  (XANAX) tablet       Return in about 6 months (around 07/05/2014) for Recheck.

## 2014-01-03 NOTE — Assessment & Plan Note (Signed)
Recently diagnosed with rectal cancer. Reviewed notes from recent hospitalization. Initial presentation with fatigue, Hgb4. No rectal pain or bleeding noted by pt. Starting XRT this week, followed by chemo. Will continue to follow.

## 2014-01-03 NOTE — Assessment & Plan Note (Signed)
BP Readings from Last 3 Encounters:  01/03/14 124/84  12/22/13 118/70  06/25/12 134/80   BP well controlled with amlodipine. Will continue.

## 2014-01-03 NOTE — Progress Notes (Signed)
Pre visit review using our clinic review tool, if applicable. No additional management support is needed unless otherwise documented below in the visit note. 

## 2014-01-03 NOTE — Assessment & Plan Note (Signed)
Coping well with recent cancer diagnosis. Will continue prn alprazolam for any episodes of severe anxiety. Follow up prn.

## 2014-01-04 ENCOUNTER — Telehealth: Payer: Self-pay | Admitting: Internal Medicine

## 2014-01-04 NOTE — Telephone Encounter (Signed)
Relevant patient education assigned to patient using Emmi. ° °

## 2014-01-05 LAB — CEA: CEA: 16.3 ng/mL — ABNORMAL HIGH (ref 0.0–4.7)

## 2014-01-06 LAB — URINALYSIS, COMPLETE
Bacteria: NONE SEEN
Bilirubin,UR: NEGATIVE
GLUCOSE, UR: NEGATIVE mg/dL (ref 0–75)
Ketone: NEGATIVE
Leukocyte Esterase: NEGATIVE
NITRITE: NEGATIVE
Ph: 5 (ref 4.5–8.0)
Protein: 100
RBC,UR: 7 /HPF (ref 0–5)
SPECIFIC GRAVITY: 1.017 (ref 1.003–1.030)
Squamous Epithelial: 1

## 2014-01-06 LAB — CBC CANCER CENTER
BASOS ABS: 0.1 x10 3/mm (ref 0.0–0.1)
Basophil %: 1.4 %
EOS ABS: 0.2 x10 3/mm (ref 0.0–0.7)
Eosinophil %: 2.9 %
HCT: 33.9 % — ABNORMAL LOW (ref 35.0–47.0)
HGB: 10.7 g/dL — ABNORMAL LOW (ref 12.0–16.0)
Lymphocyte #: 0.4 x10 3/mm — ABNORMAL LOW (ref 1.0–3.6)
Lymphocyte %: 5.4 %
MCH: 23.6 pg — AB (ref 26.0–34.0)
MCHC: 31.6 g/dL — ABNORMAL LOW (ref 32.0–36.0)
MCV: 75 fL — ABNORMAL LOW (ref 80–100)
Monocyte #: 0.5 x10 3/mm (ref 0.2–0.9)
Monocyte %: 6.3 %
NEUTROS ABS: 6 x10 3/mm (ref 1.4–6.5)
Neutrophil %: 84 %
Platelet: 390 x10 3/mm (ref 150–440)
RBC: 4.53 10*6/uL (ref 3.80–5.20)
RDW: 32.8 % — ABNORMAL HIGH (ref 11.5–14.5)
WBC: 7.2 x10 3/mm (ref 3.6–11.0)

## 2014-01-08 LAB — URINE CULTURE

## 2014-01-10 ENCOUNTER — Ambulatory Visit: Payer: Self-pay | Admitting: Vascular Surgery

## 2014-01-11 LAB — COMPREHENSIVE METABOLIC PANEL
ALK PHOS: 84 U/L
Albumin: 2.2 g/dL — ABNORMAL LOW (ref 3.4–5.0)
Anion Gap: 8 (ref 7–16)
BILIRUBIN TOTAL: 0.5 mg/dL (ref 0.2–1.0)
BUN: 9 mg/dL (ref 7–18)
CHLORIDE: 103 mmol/L (ref 98–107)
CO2: 27 mmol/L (ref 21–32)
CREATININE: 0.58 mg/dL — AB (ref 0.60–1.30)
Calcium, Total: 8.6 mg/dL (ref 8.5–10.1)
EGFR (African American): >60
GLUCOSE: 102 mg/dL — AB (ref 65–99)
Osmolality: 275 (ref 275–301)
Potassium: 3.4 mmol/L — ABNORMAL LOW (ref 3.5–5.1)
SGOT(AST): 10 U/L — ABNORMAL LOW (ref 15–37)
SGPT (ALT): 12 U/L (ref 12–78)
SODIUM: 138 mmol/L (ref 136–145)
TOTAL PROTEIN: 6.7 g/dL (ref 6.4–8.2)

## 2014-01-11 LAB — CBC CANCER CENTER
Basophil #: 0 x10 3/mm (ref 0.0–0.1)
Basophil %: 0.6 %
Eosinophil #: 0.2 x10 3/mm (ref 0.0–0.7)
Eosinophil %: 2.8 %
HCT: 31 % — ABNORMAL LOW (ref 35.0–47.0)
HGB: 9.8 g/dL — AB (ref 12.0–16.0)
LYMPHS ABS: 0.6 x10 3/mm — AB (ref 1.0–3.6)
Lymphocyte %: 8 %
MCH: 24.1 pg — AB (ref 26.0–34.0)
MCHC: 31.5 g/dL — AB (ref 32.0–36.0)
MCV: 77 fL — AB (ref 80–100)
Monocyte #: 0.6 x10 3/mm (ref 0.2–0.9)
Monocyte %: 7.3 %
NEUTROS ABS: 6.5 x10 3/mm (ref 1.4–6.5)
NEUTROS PCT: 81.3 %
Platelet: 310 x10 3/mm (ref 150–440)
RBC: 4.05 10*6/uL (ref 3.80–5.20)
RDW: 29.5 % — ABNORMAL HIGH (ref 11.5–14.5)
WBC: 8 x10 3/mm (ref 3.6–11.0)

## 2014-01-11 LAB — CULTURE, BLOOD (SINGLE)

## 2014-01-18 LAB — CBC CANCER CENTER
Basophil #: 0 x10 3/mm (ref 0.0–0.1)
Basophil %: 0.4 %
EOS PCT: 3 %
Eosinophil #: 0.3 x10 3/mm (ref 0.0–0.7)
HCT: 33.9 % — ABNORMAL LOW (ref 35.0–47.0)
HGB: 10.9 g/dL — ABNORMAL LOW (ref 12.0–16.0)
Lymphocyte #: 0.5 x10 3/mm — ABNORMAL LOW (ref 1.0–3.6)
Lymphocyte %: 4.7 %
MCH: 24.9 pg — ABNORMAL LOW (ref 26.0–34.0)
MCHC: 32 g/dL (ref 32.0–36.0)
MCV: 78 fL — AB (ref 80–100)
MONO ABS: 0.5 x10 3/mm (ref 0.2–0.9)
Monocyte %: 5.4 %
Neutrophil #: 8.5 x10 3/mm — ABNORMAL HIGH (ref 1.4–6.5)
Neutrophil %: 86.5 %
Platelet: 420 x10 3/mm (ref 150–440)
RBC: 4.36 10*6/uL (ref 3.80–5.20)
RDW: 27.3 % — ABNORMAL HIGH (ref 11.5–14.5)
WBC: 9.8 x10 3/mm (ref 3.6–11.0)

## 2014-01-19 ENCOUNTER — Ambulatory Visit: Payer: Self-pay | Admitting: Hematology and Oncology

## 2014-02-01 LAB — CBC CANCER CENTER
BASOS ABS: 0 x10 3/mm (ref 0.0–0.1)
Basophil %: 1.2 %
Eosinophil #: 0.3 x10 3/mm (ref 0.0–0.7)
Eosinophil %: 8.2 %
HCT: 33.7 % — AB (ref 35.0–47.0)
HGB: 10.9 g/dL — AB (ref 12.0–16.0)
LYMPHS PCT: 9.1 %
Lymphocyte #: 0.3 x10 3/mm — ABNORMAL LOW (ref 1.0–3.6)
MCH: 26.1 pg (ref 26.0–34.0)
MCHC: 32.4 g/dL (ref 32.0–36.0)
MCV: 81 fL (ref 80–100)
MONO ABS: 0.3 x10 3/mm (ref 0.2–0.9)
MONOS PCT: 9.3 %
NEUTROS ABS: 2.4 x10 3/mm (ref 1.4–6.5)
Neutrophil %: 72.2 %
PLATELETS: 258 x10 3/mm (ref 150–440)
RBC: 4.18 10*6/uL (ref 3.80–5.20)
RDW: 21.8 % — ABNORMAL HIGH (ref 11.5–14.5)
WBC: 3.3 x10 3/mm — ABNORMAL LOW (ref 3.6–11.0)

## 2014-02-08 LAB — CBC CANCER CENTER
Basophil #: 0 x10 3/mm (ref 0.0–0.1)
Basophil %: 0.7 %
Eosinophil #: 0.3 x10 3/mm (ref 0.0–0.7)
Eosinophil %: 6.5 %
HCT: 33.6 % — ABNORMAL LOW (ref 35.0–47.0)
HGB: 10.7 g/dL — ABNORMAL LOW (ref 12.0–16.0)
Lymphocyte #: 0.3 x10 3/mm — ABNORMAL LOW (ref 1.0–3.6)
Lymphocyte %: 6.4 %
MCH: 26.5 pg (ref 26.0–34.0)
MCHC: 31.7 g/dL — AB (ref 32.0–36.0)
MCV: 83 fL (ref 80–100)
MONO ABS: 0.3 x10 3/mm (ref 0.2–0.9)
Monocyte %: 5.5 %
Neutrophil #: 3.9 x10 3/mm (ref 1.4–6.5)
Neutrophil %: 80.9 %
PLATELETS: 267 x10 3/mm (ref 150–440)
RBC: 4.03 10*6/uL (ref 3.80–5.20)
RDW: 17.8 % — ABNORMAL HIGH (ref 11.5–14.5)
WBC: 4.8 x10 3/mm (ref 3.6–11.0)

## 2014-02-15 LAB — CBC CANCER CENTER
Basophil #: 0 x10 3/mm (ref 0.0–0.1)
Basophil %: 0.9 %
EOS PCT: 7.1 %
Eosinophil #: 0.3 x10 3/mm (ref 0.0–0.7)
HCT: 33.5 % — AB (ref 35.0–47.0)
HGB: 10.9 g/dL — ABNORMAL LOW (ref 12.0–16.0)
Lymphocyte #: 0.3 x10 3/mm — ABNORMAL LOW (ref 1.0–3.6)
Lymphocyte %: 7.5 %
MCH: 27.4 pg (ref 26.0–34.0)
MCHC: 32.5 g/dL (ref 32.0–36.0)
MCV: 84 fL (ref 80–100)
Monocyte #: 0.3 x10 3/mm (ref 0.2–0.9)
Monocyte %: 7.9 %
NEUTROS ABS: 3.2 x10 3/mm (ref 1.4–6.5)
NEUTROS PCT: 76.6 %
Platelet: 236 x10 3/mm (ref 150–440)
RBC: 3.97 10*6/uL (ref 3.80–5.20)
RDW: 15.9 % — ABNORMAL HIGH (ref 11.5–14.5)
WBC: 4.1 x10 3/mm (ref 3.6–11.0)

## 2014-02-16 LAB — CBC CANCER CENTER
BASOS ABS: 0 x10 3/mm (ref 0.0–0.1)
Basophil %: 0.9 %
Eosinophil #: 0.2 x10 3/mm (ref 0.0–0.7)
Eosinophil %: 6.5 %
HCT: 31.9 % — ABNORMAL LOW (ref 35.0–47.0)
HGB: 10.5 g/dL — AB (ref 12.0–16.0)
LYMPHS ABS: 0.3 x10 3/mm — AB (ref 1.0–3.6)
LYMPHS PCT: 6.6 %
MCH: 27.7 pg (ref 26.0–34.0)
MCHC: 32.8 g/dL (ref 32.0–36.0)
MCV: 84 fL (ref 80–100)
MONOS PCT: 7.8 %
Monocyte #: 0.3 x10 3/mm (ref 0.2–0.9)
Neutrophil #: 3 x10 3/mm (ref 1.4–6.5)
Neutrophil %: 78.2 %
Platelet: 230 x10 3/mm (ref 150–440)
RBC: 3.78 10*6/uL — ABNORMAL LOW (ref 3.80–5.20)
RDW: 15.6 % — AB (ref 11.5–14.5)
WBC: 3.9 x10 3/mm (ref 3.6–11.0)

## 2014-02-16 LAB — COMPREHENSIVE METABOLIC PANEL
ALBUMIN: 2.4 g/dL — AB (ref 3.4–5.0)
ALK PHOS: 68 U/L
AST: 12 U/L — AB (ref 15–37)
Anion Gap: 3 — ABNORMAL LOW (ref 7–16)
BUN: 10 mg/dL (ref 7–18)
Bilirubin,Total: 0.3 mg/dL (ref 0.2–1.0)
CO2: 31 mmol/L (ref 21–32)
Calcium, Total: 8.4 mg/dL — ABNORMAL LOW (ref 8.5–10.1)
Chloride: 104 mmol/L (ref 98–107)
Creatinine: 0.69 mg/dL (ref 0.60–1.30)
EGFR (Non-African Amer.): >60
Glucose: 95 mg/dL (ref 65–99)
OSMOLALITY: 275 (ref 275–301)
Potassium: 3.9 mmol/L (ref 3.5–5.1)
SGPT (ALT): 13 U/L — ABNORMAL LOW
SODIUM: 138 mmol/L (ref 136–145)
TOTAL PROTEIN: 6.1 g/dL — AB (ref 6.4–8.2)

## 2014-02-19 ENCOUNTER — Ambulatory Visit: Payer: Self-pay | Admitting: Hematology and Oncology

## 2014-02-22 LAB — CBC CANCER CENTER
BASOS ABS: 0 x10 3/mm (ref 0.0–0.1)
BASOS PCT: 1 %
EOS ABS: 0.3 x10 3/mm (ref 0.0–0.7)
Eosinophil %: 9.1 %
HCT: 34.4 % — ABNORMAL LOW (ref 35.0–47.0)
HGB: 11.2 g/dL — ABNORMAL LOW (ref 12.0–16.0)
LYMPHS ABS: 0.2 x10 3/mm — AB (ref 1.0–3.6)
LYMPHS PCT: 6 %
MCH: 27.8 pg (ref 26.0–34.0)
MCHC: 32.6 g/dL (ref 32.0–36.0)
MCV: 86 fL (ref 80–100)
MONO ABS: 0.2 x10 3/mm (ref 0.2–0.9)
MONOS PCT: 5.8 %
NEUTROS ABS: 2.5 x10 3/mm (ref 1.4–6.5)
Neutrophil %: 78.1 %
PLATELETS: 229 x10 3/mm (ref 150–440)
RBC: 4.02 10*6/uL (ref 3.80–5.20)
RDW: 15.3 % — ABNORMAL HIGH (ref 11.5–14.5)
WBC: 3.2 x10 3/mm — AB (ref 3.6–11.0)

## 2014-03-08 ENCOUNTER — Other Ambulatory Visit: Payer: Self-pay | Admitting: Internal Medicine

## 2014-03-09 LAB — COMPREHENSIVE METABOLIC PANEL
ALBUMIN: 2.7 g/dL — AB (ref 3.4–5.0)
AST: 12 U/L — AB (ref 15–37)
Alkaline Phosphatase: 70 U/L
Anion Gap: 9 (ref 7–16)
BUN: 8 mg/dL (ref 7–18)
Bilirubin,Total: 0.3 mg/dL (ref 0.2–1.0)
CREATININE: 0.73 mg/dL (ref 0.60–1.30)
Calcium, Total: 8.3 mg/dL — ABNORMAL LOW (ref 8.5–10.1)
Chloride: 105 mmol/L (ref 98–107)
Co2: 28 mmol/L (ref 21–32)
EGFR (African American): >60
EGFR (Non-African Amer.): >60
GLUCOSE: 113 mg/dL — AB (ref 65–99)
OSMOLALITY: 282 (ref 275–301)
POTASSIUM: 3.2 mmol/L — AB (ref 3.5–5.1)
SGPT (ALT): 18 U/L
Sodium: 142 mmol/L (ref 136–145)
Total Protein: 6.4 g/dL (ref 6.4–8.2)

## 2014-03-09 LAB — CBC CANCER CENTER
BASOS ABS: 0 x10 3/mm (ref 0.0–0.1)
BASOS PCT: 0.8 %
Eosinophil #: 0.1 x10 3/mm (ref 0.0–0.7)
Eosinophil %: 2.3 %
HCT: 34.2 % — ABNORMAL LOW (ref 35.0–47.0)
HGB: 11.1 g/dL — AB (ref 12.0–16.0)
LYMPHS ABS: 0.2 x10 3/mm — AB (ref 1.0–3.6)
Lymphocyte %: 4.8 %
MCH: 27.6 pg (ref 26.0–34.0)
MCHC: 32.5 g/dL (ref 32.0–36.0)
MCV: 85 fL (ref 80–100)
MONO ABS: 0.2 x10 3/mm (ref 0.2–0.9)
Monocyte %: 4.7 %
Neutrophil #: 3.8 x10 3/mm (ref 1.4–6.5)
Neutrophil %: 87.4 %
PLATELETS: 271 x10 3/mm (ref 150–440)
RBC: 4.03 10*6/uL (ref 3.80–5.20)
RDW: 15.1 % — ABNORMAL HIGH (ref 11.5–14.5)
WBC: 4.3 x10 3/mm (ref 3.6–11.0)

## 2014-03-09 NOTE — Telephone Encounter (Signed)
Last visit 01/03/14, refill?

## 2014-03-15 ENCOUNTER — Encounter: Payer: Self-pay | Admitting: Internal Medicine

## 2014-03-15 MED ORDER — FLUOXETINE HCL 20 MG PO TABS: 20.0000 mg | ORAL_TABLET | Freq: Every day | ORAL | Status: DC

## 2014-03-16 LAB — CBC CANCER CENTER
Basophil #: 0 x10 3/mm (ref 0.0–0.1)
Basophil %: 1 %
Eosinophil #: 0.2 x10 3/mm (ref 0.0–0.7)
Eosinophil %: 5.4 %
HCT: 34.1 % — ABNORMAL LOW (ref 35.0–47.0)
HGB: 11.1 g/dL — ABNORMAL LOW (ref 12.0–16.0)
LYMPHS ABS: 0.3 x10 3/mm — AB (ref 1.0–3.6)
LYMPHS PCT: 8.9 %
MCH: 27.8 pg (ref 26.0–34.0)
MCHC: 32.5 g/dL (ref 32.0–36.0)
MCV: 85 fL (ref 80–100)
MONO ABS: 0.3 x10 3/mm (ref 0.2–0.9)
Monocyte %: 8.7 %
NEUTROS PCT: 76 %
Neutrophil #: 2.4 x10 3/mm (ref 1.4–6.5)
Platelet: 220 x10 3/mm (ref 150–440)
RBC: 4 10*6/uL (ref 3.80–5.20)
RDW: 15.4 % — ABNORMAL HIGH (ref 11.5–14.5)
WBC: 3.2 x10 3/mm — AB (ref 3.6–11.0)

## 2014-03-22 ENCOUNTER — Telehealth: Payer: Self-pay | Admitting: Internal Medicine

## 2014-03-22 ENCOUNTER — Ambulatory Visit: Payer: Self-pay | Admitting: Hematology and Oncology

## 2014-03-22 NOTE — Telephone Encounter (Signed)
Pt is a dr walker patient.  She is going through cancer treatments and signed up for medicaid. She stated they are going to put her on [place] access because she make to much money for regular medicaid.  She was very emotional that we didn't take [place] access. She wanted to know if there was anything she could do to still be seen by dr walker

## 2014-03-23 ENCOUNTER — Other Ambulatory Visit: Payer: Self-pay | Admitting: *Deleted

## 2014-03-23 ENCOUNTER — Encounter: Payer: Self-pay | Admitting: Internal Medicine

## 2014-03-23 MED ORDER — ZOLPIDEM TARTRATE 10 MG PO TABS: 10.0000 mg | ORAL_TABLET | Freq: Every evening | ORAL | Status: DC | PRN

## 2014-03-24 NOTE — Telephone Encounter (Signed)
Patient returned my call and states that [place] Medicaid will be her only insurance. She states on the card it says [facility] and their number is [phone]. I told patient that I would contact them and see if they would give [place] their Medicaid NPI number so she could still see [place]. I have tried calling them with no answer.

## 2014-03-24 NOTE — Telephone Encounter (Signed)
I have left msg for patient to return my call. If she still has her previous insurance then we can see her as long as the [place] medicaid is secondary.

## 2014-03-24 NOTE — Telephone Encounter (Signed)
Faxed to pharmacy

## 2014-03-28 DIAGNOSIS — C20 Malignant neoplasm of rectum: Secondary | ICD-10-CM | POA: Insufficient documentation

## 2014-03-31 ENCOUNTER — Encounter: Payer: Self-pay | Admitting: Internal Medicine

## 2014-04-04 LAB — CBC CANCER CENTER
Basophil #: 0 x10 3/mm (ref 0.0–0.1)
Basophil %: 0.8 %
Eosinophil #: 0.2 x10 3/mm (ref 0.0–0.7)
Eosinophil %: 4 %
HCT: 35.2 % (ref 35.0–47.0)
HGB: 11.2 g/dL — AB (ref 12.0–16.0)
Lymphocyte #: 0.3 x10 3/mm — ABNORMAL LOW (ref 1.0–3.6)
Lymphocyte %: 7.8 %
MCH: 27.1 pg (ref 26.0–34.0)
MCHC: 31.9 g/dL — ABNORMAL LOW (ref 32.0–36.0)
MCV: 85 fL (ref 80–100)
Monocyte #: 0.4 x10 3/mm (ref 0.2–0.9)
Monocyte %: 8.7 %
Neutrophil #: 3.3 x10 3/mm (ref 1.4–6.5)
Neutrophil %: 78.7 %
Platelet: 181 x10 3/mm (ref 150–440)
RBC: 4.14 10*6/uL (ref 3.80–5.20)
RDW: 14.9 % — ABNORMAL HIGH (ref 11.5–14.5)
WBC: 4.1 x10 3/mm (ref 3.6–11.0)

## 2014-04-04 LAB — HEPATIC FUNCTION PANEL A (ARMC)
ALBUMIN: 3 g/dL — AB (ref 3.4–5.0)
ALK PHOS: 69 U/L
BILIRUBIN TOTAL: 0.3 mg/dL (ref 0.2–1.0)
Bilirubin, Direct: <0.1 mg/dL (ref 0.00–0.20)
SGOT(AST): 14 U/L — ABNORMAL LOW (ref 15–37)
SGPT (ALT): 17 U/L
Total Protein: 6.7 g/dL (ref 6.4–8.2)

## 2014-04-04 LAB — CREATININE, SERUM
Creatinine: 0.64 mg/dL (ref 0.60–1.30)
EGFR (Non-African Amer.): >60

## 2014-04-12 HISTORY — PX: VAGINA RECONSTRUCTION SURGERY: SHX828

## 2014-04-12 HISTORY — PX: SALPINGOOPHORECTOMY: SHX82

## 2014-04-12 HISTORY — PX: ABDOMINAL HYSTERECTOMY: SHX81

## 2014-04-12 HISTORY — PX: PROCTECTOMY: SHX315

## 2014-04-12 HISTORY — PX: TOTAL ABDOMINAL HYSTERECTOMY W/ BILATERAL SALPINGOOPHORECTOMY: SHX83

## 2014-04-21 ENCOUNTER — Ambulatory Visit: Payer: Self-pay | Admitting: Hematology and Oncology

## 2014-05-06 ENCOUNTER — Other Ambulatory Visit: Payer: Self-pay

## 2014-05-30 ENCOUNTER — Ambulatory Visit: Payer: Self-pay | Admitting: Hematology and Oncology

## 2014-05-30 LAB — COMPREHENSIVE METABOLIC PANEL
AST: 25 U/L (ref 15–37)
Albumin: 3.5 g/dL (ref 3.4–5.0)
Alkaline Phosphatase: 156 U/L — ABNORMAL HIGH
Anion Gap: 9 (ref 7–16)
BUN: 12 mg/dL (ref 7–18)
Bilirubin,Total: 0.4 mg/dL (ref 0.2–1.0)
CO2: 28 mmol/L (ref 21–32)
Calcium, Total: 9.1 mg/dL (ref 8.5–10.1)
Chloride: 102 mmol/L (ref 98–107)
Creatinine: 0.72 mg/dL (ref 0.60–1.30)
EGFR (African American): >60
EGFR (Non-African Amer.): >60
GLUCOSE: 95 mg/dL (ref 65–99)
OSMOLALITY: 277 (ref 275–301)
Potassium: 4.1 mmol/L (ref 3.5–5.1)
SGPT (ALT): 30 U/L
Sodium: 139 mmol/L (ref 136–145)
Total Protein: 7.5 g/dL (ref 6.4–8.2)

## 2014-05-30 LAB — CBC CANCER CENTER
BASOS ABS: 0.1 x10 3/mm (ref 0.0–0.1)
BASOS PCT: 1 %
EOS PCT: 4.4 %
Eosinophil #: 0.3 x10 3/mm (ref 0.0–0.7)
HCT: 34.2 % — AB (ref 35.0–47.0)
HGB: 10.9 g/dL — ABNORMAL LOW (ref 12.0–16.0)
LYMPHS ABS: 0.5 x10 3/mm — AB (ref 1.0–3.6)
LYMPHS PCT: 7.1 %
MCH: 25.2 pg — ABNORMAL LOW (ref 26.0–34.0)
MCHC: 31.9 g/dL — AB (ref 32.0–36.0)
MCV: 79 fL — ABNORMAL LOW (ref 80–100)
MONOS PCT: 5.8 %
Monocyte #: 0.4 x10 3/mm (ref 0.2–0.9)
NEUTROS ABS: 5.4 x10 3/mm (ref 1.4–6.5)
NEUTROS PCT: 81.7 %
PLATELETS: 296 x10 3/mm (ref 150–440)
RBC: 4.32 10*6/uL (ref 3.80–5.20)
RDW: 15.5 % — ABNORMAL HIGH (ref 11.5–14.5)
WBC: 6.6 x10 3/mm (ref 3.6–11.0)

## 2014-06-06 LAB — CBC CANCER CENTER
BASOS PCT: 0.6 %
Basophil #: 0 x10 3/mm (ref 0.0–0.1)
EOS PCT: 4 %
Eosinophil #: 0.2 x10 3/mm (ref 0.0–0.7)
HCT: 34.6 % — ABNORMAL LOW (ref 35.0–47.0)
HGB: 11 g/dL — AB (ref 12.0–16.0)
Lymphocyte #: 0.3 x10 3/mm — ABNORMAL LOW (ref 1.0–3.6)
Lymphocyte %: 5.4 %
MCH: 25.3 pg — AB (ref 26.0–34.0)
MCHC: 31.8 g/dL — ABNORMAL LOW (ref 32.0–36.0)
MCV: 79 fL — ABNORMAL LOW (ref 80–100)
Monocyte #: 0.3 x10 3/mm (ref 0.2–0.9)
Monocyte %: 4.7 %
NEUTROS PCT: 85.3 %
Neutrophil #: 5 x10 3/mm (ref 1.4–6.5)
Platelet: 282 x10 3/mm (ref 150–440)
RBC: 4.35 10*6/uL (ref 3.80–5.20)
RDW: 15 % — ABNORMAL HIGH (ref 11.5–14.5)
WBC: 5.9 x10 3/mm (ref 3.6–11.0)

## 2014-06-13 LAB — CBC CANCER CENTER
BASOS ABS: 0 x10 3/mm (ref 0.0–0.1)
BASOS PCT: 1.2 %
EOS PCT: 4.6 %
Eosinophil #: 0.1 x10 3/mm (ref 0.0–0.7)
HCT: 32.8 % — ABNORMAL LOW (ref 35.0–47.0)
HGB: 10.1 g/dL — AB (ref 12.0–16.0)
LYMPHS ABS: 0.3 x10 3/mm — AB (ref 1.0–3.6)
Lymphocyte %: 8.5 %
MCH: 24.6 pg — ABNORMAL LOW (ref 26.0–34.0)
MCHC: 30.8 g/dL — AB (ref 32.0–36.0)
MCV: 80 fL (ref 80–100)
MONOS PCT: 10.2 %
Monocyte #: 0.3 x10 3/mm (ref 0.2–0.9)
Neutrophil #: 2.3 x10 3/mm (ref 1.4–6.5)
Neutrophil %: 75.5 %
Platelet: 216 x10 3/mm (ref 150–440)
RBC: 4.11 10*6/uL (ref 3.80–5.20)
RDW: 15.7 % — AB (ref 11.5–14.5)
WBC: 3 x10 3/mm — ABNORMAL LOW (ref 3.6–11.0)

## 2014-06-13 LAB — HEPATIC FUNCTION PANEL A (ARMC)
Albumin: 3.3 g/dL — ABNORMAL LOW (ref 3.4–5.0)
Alkaline Phosphatase: 155 U/L — ABNORMAL HIGH
BILIRUBIN DIRECT: 0.1 mg/dL (ref 0.0–0.2)
Bilirubin,Total: 0.3 mg/dL (ref 0.2–1.0)
SGOT(AST): 86 U/L — ABNORMAL HIGH (ref 15–37)
SGPT (ALT): 93 U/L — ABNORMAL HIGH
Total Protein: 7 g/dL (ref 6.4–8.2)

## 2014-06-13 LAB — BASIC METABOLIC PANEL
Anion Gap: 10 (ref 7–16)
BUN: 15 mg/dL (ref 7–18)
CREATININE: 0.66 mg/dL (ref 0.60–1.30)
Calcium, Total: 8.8 mg/dL (ref 8.5–10.1)
Chloride: 103 mmol/L (ref 98–107)
Co2: 26 mmol/L (ref 21–32)
EGFR (Non-African Amer.): >60
Glucose: 91 mg/dL (ref 65–99)
OSMOLALITY: 278 (ref 275–301)
Potassium: 3.9 mmol/L (ref 3.5–5.1)
Sodium: 139 mmol/L (ref 136–145)

## 2014-06-20 LAB — CBC CANCER CENTER
BASOS ABS: 0 x10 3/mm (ref 0.0–0.1)
Basophil %: 1.4 %
EOS ABS: 0.2 x10 3/mm (ref 0.0–0.7)
Eosinophil %: 6.4 %
HCT: 33.3 % — ABNORMAL LOW (ref 35.0–47.0)
HGB: 10.5 g/dL — AB (ref 12.0–16.0)
LYMPHS ABS: 0.4 x10 3/mm — AB (ref 1.0–3.6)
Lymphocyte %: 15.5 %
MCH: 24.4 pg — AB (ref 26.0–34.0)
MCHC: 31.4 g/dL — ABNORMAL LOW (ref 32.0–36.0)
MCV: 78 fL — AB (ref 80–100)
MONOS PCT: 10 %
Monocyte #: 0.3 x10 3/mm (ref 0.2–0.9)
Neutrophil #: 1.7 x10 3/mm (ref 1.4–6.5)
Neutrophil %: 66.7 %
PLATELETS: 242 x10 3/mm (ref 150–440)
RBC: 4.29 10*6/uL (ref 3.80–5.20)
RDW: 15.4 % — AB (ref 11.5–14.5)
WBC: 2.6 x10 3/mm — AB (ref 3.6–11.0)

## 2014-06-21 ENCOUNTER — Ambulatory Visit: Payer: Self-pay | Admitting: Hematology and Oncology

## 2014-06-27 LAB — CBC CANCER CENTER
Basophil #: 0 x10 3/mm (ref 0.0–0.1)
Basophil %: 0.9 %
Eosinophil #: 0.1 x10 3/mm (ref 0.0–0.7)
Eosinophil %: 2.6 %
HCT: 31.8 % — ABNORMAL LOW (ref 35.0–47.0)
HGB: 10.2 g/dL — AB (ref 12.0–16.0)
Lymphocyte #: 0.3 x10 3/mm — ABNORMAL LOW (ref 1.0–3.6)
Lymphocyte %: 8.1 %
MCH: 24.4 pg — ABNORMAL LOW (ref 26.0–34.0)
MCHC: 32 g/dL (ref 32.0–36.0)
MCV: 76 fL — ABNORMAL LOW (ref 80–100)
Monocyte #: 0.4 x10 3/mm (ref 0.2–0.9)
Monocyte %: 10.1 %
NEUTROS PCT: 78.3 %
Neutrophil #: 3.2 x10 3/mm (ref 1.4–6.5)
Platelet: 188 x10 3/mm (ref 150–440)
RBC: 4.18 10*6/uL (ref 3.80–5.20)
RDW: 16 % — ABNORMAL HIGH (ref 11.5–14.5)
WBC: 4.1 x10 3/mm (ref 3.6–11.0)

## 2014-06-27 LAB — HEPATIC FUNCTION PANEL A (ARMC)
ALBUMIN: 3.2 g/dL — AB (ref 3.4–5.0)
ALT: 48 U/L
AST: 41 U/L — AB (ref 15–37)
Alkaline Phosphatase: 145 U/L — ABNORMAL HIGH
BILIRUBIN TOTAL: 0.3 mg/dL (ref 0.2–1.0)
Bilirubin, Direct: 0.1 mg/dL (ref 0.0–0.2)
Total Protein: 7.1 g/dL (ref 6.4–8.2)

## 2014-06-27 LAB — BASIC METABOLIC PANEL
Anion Gap: 8 (ref 7–16)
BUN: 17 mg/dL (ref 7–18)
CALCIUM: 8.9 mg/dL (ref 8.5–10.1)
CHLORIDE: 103 mmol/L (ref 98–107)
Co2: 28 mmol/L (ref 21–32)
Creatinine: 0.71 mg/dL (ref 0.60–1.30)
EGFR (African American): >60
Glucose: 89 mg/dL (ref 65–99)
OSMOLALITY: 279 (ref 275–301)
Potassium: 3.8 mmol/L (ref 3.5–5.1)
SODIUM: 139 mmol/L (ref 136–145)

## 2014-07-04 LAB — CBC CANCER CENTER
BASOS PCT: 1.8 %
Basophil #: 0 x10 3/mm (ref 0.0–0.1)
EOS PCT: 5.9 %
Eosinophil #: 0.1 x10 3/mm (ref 0.0–0.7)
HCT: 34.2 % — ABNORMAL LOW (ref 35.0–47.0)
HGB: 10.7 g/dL — AB (ref 12.0–16.0)
Lymphocyte #: 0.4 x10 3/mm — ABNORMAL LOW (ref 1.0–3.6)
Lymphocyte %: 16.9 %
MCH: 24.1 pg — ABNORMAL LOW (ref 26.0–34.0)
MCHC: 31.1 g/dL — AB (ref 32.0–36.0)
MCV: 77 fL — ABNORMAL LOW (ref 80–100)
Monocyte #: 0.2 x10 3/mm (ref 0.2–0.9)
Monocyte %: 10.4 %
NEUTROS ABS: 1.4 x10 3/mm (ref 1.4–6.5)
Neutrophil %: 65 %
Platelet: 179 x10 3/mm (ref 150–440)
RBC: 4.42 10*6/uL (ref 3.80–5.20)
RDW: 15.8 % — ABNORMAL HIGH (ref 11.5–14.5)
WBC: 2.2 x10 3/mm — ABNORMAL LOW (ref 3.6–11.0)

## 2014-07-11 LAB — CBC CANCER CENTER
Basophil #: 0 x10 3/mm (ref 0.0–0.1)
Basophil %: 1.9 %
Eosinophil #: 0.1 x10 3/mm (ref 0.0–0.7)
Eosinophil %: 5.8 %
HCT: 32.3 % — ABNORMAL LOW (ref 35.0–47.0)
HGB: 10.1 g/dL — ABNORMAL LOW (ref 12.0–16.0)
LYMPHS ABS: 0.4 x10 3/mm — AB (ref 1.0–3.6)
Lymphocyte %: 18.5 %
MCH: 24.4 pg — ABNORMAL LOW (ref 26.0–34.0)
MCHC: 31.2 g/dL — ABNORMAL LOW (ref 32.0–36.0)
MCV: 78 fL — AB (ref 80–100)
Monocyte #: 0.3 x10 3/mm (ref 0.2–0.9)
Monocyte %: 13.5 %
NEUTROS PCT: 60.3 %
Neutrophil #: 1.4 x10 3/mm (ref 1.4–6.5)
Platelet: 102 x10 3/mm — ABNORMAL LOW (ref 150–440)
RBC: 4.13 10*6/uL (ref 3.80–5.20)
RDW: 17.6 % — AB (ref 11.5–14.5)
WBC: 2.4 x10 3/mm — AB (ref 3.6–11.0)

## 2014-07-11 LAB — BASIC METABOLIC PANEL
Anion Gap: 8 (ref 7–16)
BUN: 9 mg/dL (ref 7–18)
CO2: 30 mmol/L (ref 21–32)
CREATININE: 0.68 mg/dL (ref 0.60–1.30)
Calcium, Total: 8.3 mg/dL — ABNORMAL LOW (ref 8.5–10.1)
Chloride: 106 mmol/L (ref 98–107)
EGFR (African American): >60
Glucose: 86 mg/dL (ref 65–99)
Osmolality: 285 (ref 275–301)
POTASSIUM: 4.1 mmol/L (ref 3.5–5.1)
SODIUM: 144 mmol/L (ref 136–145)

## 2014-07-11 LAB — HEPATIC FUNCTION PANEL A (ARMC)
AST: 50 U/L — AB (ref 15–37)
Albumin: 2.9 g/dL — ABNORMAL LOW (ref 3.4–5.0)
Alkaline Phosphatase: 159 U/L — ABNORMAL HIGH
BILIRUBIN TOTAL: 0.2 mg/dL (ref 0.2–1.0)
Bilirubin, Direct: 0.1 mg/dL (ref 0.0–0.2)
SGPT (ALT): 91 U/L — ABNORMAL HIGH
Total Protein: 6.4 g/dL (ref 6.4–8.2)

## 2014-07-18 LAB — CBC CANCER CENTER
BASOS ABS: 0 x10 3/mm (ref 0.0–0.1)
Basophil %: 1.5 %
EOS ABS: 0.1 x10 3/mm (ref 0.0–0.7)
Eosinophil %: 5.4 %
HCT: 34.7 % — ABNORMAL LOW (ref 35.0–47.0)
HGB: 10.8 g/dL — AB (ref 12.0–16.0)
Lymphocyte #: 0.2 x10 3/mm — ABNORMAL LOW (ref 1.0–3.6)
Lymphocyte %: 13.6 %
MCH: 24.7 pg — AB (ref 26.0–34.0)
MCHC: 31.1 g/dL — AB (ref 32.0–36.0)
MCV: 80 fL (ref 80–100)
Monocyte #: 0.2 x10 3/mm (ref 0.2–0.9)
Monocyte %: 11.5 %
NEUTROS ABS: 1.2 x10 3/mm — AB (ref 1.4–6.5)
NEUTROS PCT: 68 %
Platelet: 108 x10 3/mm — ABNORMAL LOW (ref 150–440)
RBC: 4.36 10*6/uL (ref 3.80–5.20)
RDW: 18.9 % — ABNORMAL HIGH (ref 11.5–14.5)
WBC: 1.8 x10 3/mm — CL (ref 3.6–11.0)

## 2014-07-22 ENCOUNTER — Ambulatory Visit: Payer: Self-pay | Admitting: Internal Medicine

## 2014-07-25 LAB — CBC CANCER CENTER
Basophil #: 0 x10 3/mm (ref 0.0–0.1)
Basophil %: 1.1 %
Eosinophil #: 0.1 x10 3/mm (ref 0.0–0.7)
Eosinophil %: 3.6 %
HCT: 33.7 % — ABNORMAL LOW (ref 35.0–47.0)
HGB: 10.6 g/dL — ABNORMAL LOW (ref 12.0–16.0)
Lymphocyte #: 0.3 x10 3/mm — ABNORMAL LOW (ref 1.0–3.6)
Lymphocyte %: 13.5 %
MCH: 25.4 pg — ABNORMAL LOW (ref 26.0–34.0)
MCHC: 31.6 g/dL — ABNORMAL LOW (ref 32.0–36.0)
MCV: 80 fL (ref 80–100)
Monocyte #: 0.4 x10 3/mm (ref 0.2–0.9)
Monocyte %: 14.3 %
Neutrophil #: 1.7 x10 3/mm (ref 1.4–6.5)
Neutrophil %: 67.5 %
Platelet: 107 x10 3/mm — ABNORMAL LOW (ref 150–440)
RBC: 4.19 10*6/uL (ref 3.80–5.20)
RDW: 21.1 % — ABNORMAL HIGH (ref 11.5–14.5)
WBC: 2.6 x10 3/mm — ABNORMAL LOW (ref 3.6–11.0)

## 2014-07-25 LAB — COMPREHENSIVE METABOLIC PANEL
ALT: 73 U/L — AB
Albumin: 3 g/dL — ABNORMAL LOW (ref 3.4–5.0)
Alkaline Phosphatase: 182 U/L — ABNORMAL HIGH
Anion Gap: 8 (ref 7–16)
BILIRUBIN TOTAL: 0.4 mg/dL (ref 0.2–1.0)
BUN: 9 mg/dL (ref 7–18)
CALCIUM: 8.6 mg/dL (ref 8.5–10.1)
CHLORIDE: 104 mmol/L (ref 98–107)
CREATININE: 0.59 mg/dL — AB (ref 0.60–1.30)
Co2: 29 mmol/L (ref 21–32)
EGFR (African American): >60
EGFR (Non-African Amer.): >60
Glucose: 88 mg/dL (ref 65–99)
Osmolality: 279 (ref 275–301)
Potassium: 3.3 mmol/L — ABNORMAL LOW (ref 3.5–5.1)
SGOT(AST): 54 U/L — ABNORMAL HIGH (ref 15–37)
Sodium: 141 mmol/L (ref 136–145)
Total Protein: 6.8 g/dL (ref 6.4–8.2)

## 2014-08-01 LAB — CBC CANCER CENTER
BASOS PCT: 2.1 %
Basophil #: 0 x10 3/mm (ref 0.0–0.1)
EOS ABS: 0.1 x10 3/mm (ref 0.0–0.7)
EOS PCT: 5.3 %
HCT: 35.2 % (ref 35.0–47.0)
HGB: 11.1 g/dL — AB (ref 12.0–16.0)
LYMPHS ABS: 0.3 x10 3/mm — AB (ref 1.0–3.6)
Lymphocyte %: 18.6 %
MCH: 25.6 pg — AB (ref 26.0–34.0)
MCHC: 31.6 g/dL — AB (ref 32.0–36.0)
MCV: 81 fL (ref 80–100)
MONO ABS: 0.2 x10 3/mm (ref 0.2–0.9)
Monocyte %: 14.2 %
Neutrophil #: 1 x10 3/mm — ABNORMAL LOW (ref 1.4–6.5)
Neutrophil %: 59.8 %
Platelet: 119 x10 3/mm — ABNORMAL LOW (ref 150–440)
RBC: 4.34 10*6/uL (ref 3.80–5.20)
RDW: 21.7 % — ABNORMAL HIGH (ref 11.5–14.5)
WBC: 1.7 x10 3/mm — CL (ref 3.6–11.0)

## 2014-08-08 LAB — CBC CANCER CENTER
Basophil #: 0 x10 3/mm (ref 0.0–0.1)
Basophil %: 1.6 %
EOS ABS: 0.1 x10 3/mm (ref 0.0–0.7)
Eosinophil %: 4.2 %
HCT: 33.4 % — ABNORMAL LOW (ref 35.0–47.0)
HGB: 10.6 g/dL — ABNORMAL LOW (ref 12.0–16.0)
LYMPHS PCT: 17.1 %
Lymphocyte #: 0.4 x10 3/mm — ABNORMAL LOW (ref 1.0–3.6)
MCH: 25.8 pg — ABNORMAL LOW (ref 26.0–34.0)
MCHC: 31.6 g/dL — ABNORMAL LOW (ref 32.0–36.0)
MCV: 82 fL (ref 80–100)
MONOS PCT: 16.7 %
Monocyte #: 0.4 x10 3/mm (ref 0.2–0.9)
Neutrophil #: 1.4 x10 3/mm (ref 1.4–6.5)
Neutrophil %: 60.4 %
Platelet: 81 x10 3/mm — ABNORMAL LOW (ref 150–440)
RBC: 4.09 10*6/uL (ref 3.80–5.20)
RDW: 21.9 % — AB (ref 11.5–14.5)
WBC: 2.3 x10 3/mm — AB (ref 3.6–11.0)

## 2014-08-08 LAB — HEPATIC FUNCTION PANEL A (ARMC)
Albumin: 3.2 g/dL — ABNORMAL LOW (ref 3.4–5.0)
Alkaline Phosphatase: 170 U/L — ABNORMAL HIGH
BILIRUBIN TOTAL: 0.3 mg/dL (ref 0.2–1.0)
Bilirubin, Direct: 0.1 mg/dL (ref 0.0–0.2)
SGOT(AST): 41 U/L — ABNORMAL HIGH (ref 15–37)
SGPT (ALT): 44 U/L
Total Protein: 6.7 g/dL (ref 6.4–8.2)

## 2014-08-08 LAB — BASIC METABOLIC PANEL
Anion Gap: 9 (ref 7–16)
BUN: 11 mg/dL (ref 7–18)
CALCIUM: 8.2 mg/dL — AB (ref 8.5–10.1)
Chloride: 107 mmol/L (ref 98–107)
Co2: 27 mmol/L (ref 21–32)
Creatinine: 0.65 mg/dL (ref 0.60–1.30)
EGFR (African American): >60
GLUCOSE: 87 mg/dL (ref 65–99)
Osmolality: 284 (ref 275–301)
POTASSIUM: 3.3 mmol/L — AB (ref 3.5–5.1)
SODIUM: 143 mmol/L (ref 136–145)

## 2014-08-15 LAB — COMPREHENSIVE METABOLIC PANEL
ALBUMIN: 3.4 g/dL (ref 3.4–5.0)
ANION GAP: 9 (ref 7–16)
AST: 67 U/L — AB (ref 15–37)
Alkaline Phosphatase: 189 U/L — ABNORMAL HIGH
BUN: 21 mg/dL — ABNORMAL HIGH (ref 7–18)
Bilirubin,Total: 0.4 mg/dL (ref 0.2–1.0)
CALCIUM: 8.6 mg/dL (ref 8.5–10.1)
CHLORIDE: 103 mmol/L (ref 98–107)
CO2: 27 mmol/L (ref 21–32)
Creatinine: 0.64 mg/dL (ref 0.60–1.30)
Glucose: 88 mg/dL (ref 65–99)
OSMOLALITY: 280 (ref 275–301)
POTASSIUM: 4.2 mmol/L (ref 3.5–5.1)
SGPT (ALT): 58 U/L
SODIUM: 139 mmol/L (ref 136–145)
Total Protein: 7.2 g/dL (ref 6.4–8.2)

## 2014-08-15 LAB — CBC CANCER CENTER
Basophil #: 0 x10 3/mm (ref 0.0–0.1)
Basophil %: 1.8 %
Eosinophil #: 0.1 x10 3/mm (ref 0.0–0.7)
Eosinophil %: 4.8 %
HCT: 37 % (ref 35.0–47.0)
HGB: 11.8 g/dL — ABNORMAL LOW (ref 12.0–16.0)
LYMPHS PCT: 18.5 %
Lymphocyte #: 0.4 x10 3/mm — ABNORMAL LOW (ref 1.0–3.6)
MCH: 26.5 pg (ref 26.0–34.0)
MCHC: 31.9 g/dL — ABNORMAL LOW (ref 32.0–36.0)
MCV: 83 fL (ref 80–100)
MONOS PCT: 19.9 %
Monocyte #: 0.5 x10 3/mm (ref 0.2–0.9)
NEUTROS PCT: 55 %
Neutrophil #: 1.3 x10 3/mm — ABNORMAL LOW (ref 1.4–6.5)
Platelet: 125 x10 3/mm — ABNORMAL LOW (ref 150–440)
RBC: 4.46 10*6/uL (ref 3.80–5.20)
RDW: 22 % — AB (ref 11.5–14.5)
WBC: 2.3 x10 3/mm — ABNORMAL LOW (ref 3.6–11.0)

## 2014-08-22 ENCOUNTER — Ambulatory Visit: Payer: Self-pay | Admitting: Hematology and Oncology

## 2014-08-22 LAB — CBC CANCER CENTER
BASOS ABS: 0 x10 3/mm (ref 0.0–0.1)
Basophil %: 1.3 %
EOS ABS: 0.1 x10 3/mm (ref 0.0–0.7)
Eosinophil %: 4.4 %
HCT: 36.9 % (ref 35.0–47.0)
HGB: 11.8 g/dL — ABNORMAL LOW (ref 12.0–16.0)
LYMPHS PCT: 14 %
Lymphocyte #: 0.4 x10 3/mm — ABNORMAL LOW (ref 1.0–3.6)
MCH: 26.8 pg (ref 26.0–34.0)
MCHC: 32 g/dL (ref 32.0–36.0)
MCV: 84 fL (ref 80–100)
Monocyte #: 0.4 x10 3/mm (ref 0.2–0.9)
Monocyte %: 11.3 %
NEUTROS PCT: 69 %
Neutrophil #: 2.2 x10 3/mm (ref 1.4–6.5)
PLATELETS: 113 x10 3/mm — AB (ref 150–440)
RBC: 4.41 10*6/uL (ref 3.80–5.20)
RDW: 20.3 % — AB (ref 11.5–14.5)
WBC: 3.1 x10 3/mm — ABNORMAL LOW (ref 3.6–11.0)

## 2014-08-24 LAB — CEA: CEA: 2.4 ng/mL (ref 0.0–4.7)

## 2014-08-29 LAB — CBC CANCER CENTER
Basophil #: 0 x10 3/mm (ref 0.0–0.1)
Basophil %: 1 %
EOS PCT: 5 %
Eosinophil #: 0.1 x10 3/mm (ref 0.0–0.7)
HCT: 33.6 % — ABNORMAL LOW (ref 35.0–47.0)
HGB: 11 g/dL — AB (ref 12.0–16.0)
Lymphocyte #: 0.4 x10 3/mm — ABNORMAL LOW (ref 1.0–3.6)
Lymphocyte %: 16.3 %
MCH: 27.3 pg (ref 26.0–34.0)
MCHC: 32.6 g/dL (ref 32.0–36.0)
MCV: 84 fL (ref 80–100)
Monocyte #: 0.3 x10 3/mm (ref 0.2–0.9)
Monocyte %: 13.8 %
Neutrophil #: 1.5 x10 3/mm (ref 1.4–6.5)
Neutrophil %: 63.9 %
PLATELETS: 88 x10 3/mm — AB (ref 150–440)
RBC: 4.02 10*6/uL (ref 3.80–5.20)
RDW: 20.4 % — ABNORMAL HIGH (ref 11.5–14.5)
WBC: 2.4 x10 3/mm — ABNORMAL LOW (ref 3.6–11.0)

## 2014-08-29 LAB — BASIC METABOLIC PANEL
ANION GAP: 8 (ref 7–16)
BUN: 18 mg/dL (ref 7–18)
CALCIUM: 8.4 mg/dL — AB (ref 8.5–10.1)
CO2: 29 mmol/L (ref 21–32)
Chloride: 107 mmol/L (ref 98–107)
Creatinine: 0.63 mg/dL (ref 0.60–1.30)
EGFR (African American): >60
EGFR (Non-African Amer.): >60
GLUCOSE: 88 mg/dL (ref 65–99)
OSMOLALITY: 288 (ref 275–301)
Potassium: 3.3 mmol/L — ABNORMAL LOW (ref 3.5–5.1)
Sodium: 144 mmol/L (ref 136–145)

## 2014-08-29 LAB — HEPATIC FUNCTION PANEL A (ARMC)
AST: 64 U/L — AB (ref 15–37)
Albumin: 3 g/dL — ABNORMAL LOW (ref 3.4–5.0)
Alkaline Phosphatase: 206 U/L — ABNORMAL HIGH (ref 46–116)
BILIRUBIN TOTAL: 0.3 mg/dL (ref 0.2–1.0)
Bilirubin, Direct: 0.1 mg/dL (ref 0.0–0.2)
SGPT (ALT): 91 U/L — ABNORMAL HIGH (ref 14–63)
TOTAL PROTEIN: 6.6 g/dL (ref 6.4–8.2)

## 2014-09-20 ENCOUNTER — Ambulatory Visit
Admit: 2014-09-20 | Disposition: A | Discharge status: Home / Self Care | Payer: Self-pay | Attending: Internal Medicine | Admitting: Internal Medicine

## 2014-10-17 LAB — CBC CANCER CENTER
Basophil #: 0 x10 3/mm (ref 0.0–0.1)
Basophil %: 1.3 %
EOS PCT: 6.7 %
Eosinophil #: 0.2 x10 3/mm (ref 0.0–0.7)
HCT: 36.8 % (ref 35.0–47.0)
HGB: 12 g/dL (ref 12.0–16.0)
LYMPHS PCT: 17.3 %
Lymphocyte #: 0.4 x10 3/mm — ABNORMAL LOW (ref 1.0–3.6)
MCH: 28.3 pg (ref 26.0–34.0)
MCHC: 32.6 g/dL (ref 32.0–36.0)
MCV: 87 fL (ref 80–100)
MONOS PCT: 10.6 %
Monocyte #: 0.2 x10 3/mm (ref 0.2–0.9)
NEUTROS ABS: 1.5 x10 3/mm (ref 1.4–6.5)
Neutrophil %: 64.1 %
Platelet: 100 x10 3/mm — ABNORMAL LOW (ref 150–440)
RBC: 4.23 10*6/uL (ref 3.80–5.20)
RDW: 16 % — ABNORMAL HIGH (ref 11.5–14.5)
WBC: 2.3 x10 3/mm — ABNORMAL LOW (ref 3.6–11.0)

## 2014-10-21 ENCOUNTER — Ambulatory Visit
Admit: 2014-10-21 | Disposition: A | Discharge status: Home / Self Care | Payer: Self-pay | Attending: Internal Medicine | Admitting: Internal Medicine

## 2014-10-22 LAB — CREATININE, SERUM: CREATINE, SERUM: 0.59

## 2014-10-24 LAB — CBC CANCER CENTER
BASOS ABS: 0 x10 3/mm (ref 0.0–0.1)
BASOS PCT: 1 %
Eosinophil #: 0.1 x10 3/mm (ref 0.0–0.7)
Eosinophil %: 5.2 %
HCT: 35.4 % (ref 35.0–47.0)
HGB: 11.7 g/dL — ABNORMAL LOW (ref 12.0–16.0)
LYMPHS ABS: 0.3 x10 3/mm — AB (ref 1.0–3.6)
Lymphocyte %: 12.6 %
MCH: 28.7 pg (ref 26.0–34.0)
MCHC: 32.9 g/dL (ref 32.0–36.0)
MCV: 87 fL (ref 80–100)
MONO ABS: 0.3 x10 3/mm (ref 0.2–0.9)
Monocyte %: 12.5 %
NEUTROS PCT: 68.7 %
Neutrophil #: 1.9 x10 3/mm (ref 1.4–6.5)
PLATELETS: 85 x10 3/mm — AB (ref 150–440)
RBC: 4.07 10*6/uL (ref 3.80–5.20)
RDW: 15.7 % — ABNORMAL HIGH (ref 11.5–14.5)
WBC: 2.8 x10 3/mm — ABNORMAL LOW (ref 3.6–11.0)

## 2014-10-24 LAB — CREATININE, SERUM: Creatinine: 0.46 mg/dL

## 2014-10-24 LAB — HEPATIC FUNCTION PANEL A (ARMC)
Albumin: 3.5 g/dL
Alkaline Phosphatase: 162 U/L — ABNORMAL HIGH
BILIRUBIN DIRECT: 0.2 mg/dL
BILIRUBIN TOTAL: 0.7 mg/dL
Indirect Bilirubin: 0.5
SGOT(AST): 41 U/L
SGPT (ALT): 38 U/L
Total Protein: 6.9 g/dL

## 2014-10-31 LAB — CBC CANCER CENTER
BASOS PCT: 1.2 %
Basophil #: 0 x10 3/mm (ref 0.0–0.1)
Eosinophil #: 0.1 x10 3/mm (ref 0.0–0.7)
Eosinophil %: 8.4 %
HCT: 36 % (ref 35.0–47.0)
HGB: 11.7 g/dL — AB (ref 12.0–16.0)
Lymphocyte #: 0.3 x10 3/mm — ABNORMAL LOW (ref 1.0–3.6)
Lymphocyte %: 26.1 %
MCH: 28.4 pg (ref 26.0–34.0)
MCHC: 32.5 g/dL (ref 32.0–36.0)
MCV: 87 fL (ref 80–100)
MONO ABS: 0.2 x10 3/mm (ref 0.2–0.9)
Monocyte %: 18.4 %
NEUTROS ABS: 0.6 x10 3/mm — AB (ref 1.4–6.5)
NEUTROS PCT: 45.9 %
Platelet: 97 x10 3/mm — ABNORMAL LOW (ref 150–440)
RBC: 4.12 10*6/uL (ref 3.80–5.20)
RDW: 15.4 % — ABNORMAL HIGH (ref 11.5–14.5)
WBC: 1.3 x10 3/mm — CL (ref 3.6–11.0)

## 2014-11-07 LAB — CREATININE, SERUM
Creatine, Serum: 0.52
Creatinine: 0.52 mg/dL
EGFR (African American): >60
EGFR (Non-African Amer.): >60

## 2014-11-07 LAB — CBC CANCER CENTER
BASOS ABS: 0 x10 3/mm (ref 0.0–0.1)
Basophil %: 1.1 %
EOS PCT: 4 %
Eosinophil #: 0.1 x10 3/mm (ref 0.0–0.7)
HCT: 36 % (ref 35.0–47.0)
HGB: 11.6 g/dL — ABNORMAL LOW (ref 12.0–16.0)
LYMPHS ABS: 0.3 x10 3/mm — AB (ref 1.0–3.6)
Lymphocyte %: 15.4 %
MCH: 28.3 pg (ref 26.0–34.0)
MCHC: 32.2 g/dL (ref 32.0–36.0)
MCV: 88 fL (ref 80–100)
Monocyte #: 0.3 x10 3/mm (ref 0.2–0.9)
Monocyte %: 15.1 %
Neutrophil #: 1.4 x10 3/mm (ref 1.4–6.5)
Neutrophil %: 64.4 %
PLATELETS: 84 x10 3/mm — AB (ref 150–440)
RBC: 4.11 10*6/uL (ref 3.80–5.20)
RDW: 15.9 % — AB (ref 11.5–14.5)
WBC: 2.2 x10 3/mm — ABNORMAL LOW (ref 3.6–11.0)

## 2014-11-07 LAB — HEPATIC FUNCTION PANEL A (ARMC)
ALK PHOS: 137 U/L — AB
ALT: 31 U/L
Albumin: 3.5 g/dL
Bilirubin, Direct: 0.1 mg/dL
Bilirubin,Total: 0.8 mg/dL
Indirect Bilirubin: 0.7
SGOT(AST): 41 U/L
Total Protein: 6.8 g/dL

## 2014-11-12 NOTE — Consult Note (Signed)
Reason for Visit: This 49 year old Female patient presents to the clinic for initial evaluation of  rectal cancer .   Referred by dr Kallie Edward.  Diagnosis:  Chief Complaint/Diagnosis   patient is a 49 year old fmale presented to the emergency room 12/22/2013 for presumed urinary tract infection noted at that time to have a hemoglobin of 4. She was given 4 units of blood. Upper and lower endoscopy showed a gastric ulcer. On 12/22/2013 she also had lower endoscopy showing a large rectal mass 4 cm from the anus. biopsy was positive for adenocarcinoma.PET CT scan demonstrated large rectal mass extremely hypermetabolic measuring approximately 10 x 10 cm. There was also surrounding hypermetabolic mesorectal lymphadenopathy and extensive retroperitoneal lymphadenopathy. Mass displaced uterus vagina and bladder anteriorly. There was a lesion noted in the liver thought to be cystic in nature on review. This lesion was not hypermetabolic. Patient has a intentional 30 pound weight loss over the past year specifically denies any blood per rectum. She tends towards constipation. She is otherwise asymptomatic. Cases presented to our weekly tumor conference and recommendation for preop chemotherapy radiation was made.  Pathology Report pathology report reviewed   Imaging Report CT scans and PET/CT scans reviewed   Referral Report clinical notes reviewed   Planned Treatment Regimen concurrent chemoradiation and a neoadjuvant setting prior to surgical resection   HPI   as above  Past Hx:    asthma:    migraines:    gallbladder problems:    htn:    Cholecystectomy:    ERCP:   Past, Family and Social History:  Past Medical History positive   Cardiovascular hypertension   Respiratory asthma   Neurological/Psychiatric migraine   Past Surgical History cholecystectomy; ERCP   Family History noncontributory   Social History noncontributory   Additional Past Medical and Surgical History  accompanied by husband and 2 daughters today   Allergies:   Lisinopril: Hives, Itching  Home Meds:  Home Medications: Medication Instructions Status  ferrous sulfate 325 mg oral tablet 1 tab(s) orally 2 times a day Active  Ceftin 250 mg oral tablet 1 tab(s) orally 2 times a day x 3 days Active  amLODIPine 5 mg oral tablet 1 tab(s) orally once a day Active   Review of Systems:  General negative   Performance Status (ECOG) 0   Skin negative   Breast negative   Ophthalmologic negative   ENMT negative   Respiratory and Thorax negative   Cardiovascular negative   Gastrointestinal see HPI   Genitourinary negative   Musculoskeletal negative   Neurological negative   Psychiatric negative   Hematology/Lymphatics negative   Endocrine negative   Allergic/Immunologic negative   Review of Systems   review of systems obtained from nurses notes  Nursing Notes:  Nursing Vital Signs and Chemo Nursing Nursing Notes: *CC Vital Signs Flowsheet:   12-Jun-15 09:17  Temp Temperature 98.3  Pulse Pulse 85  Respirations Respirations 20  SBP SBP 130  DBP DBP 68  Pain Scale (0-10)  0  Current Weight (kg) (kg) 83.4  Height (cm) centimeters 165  BSA (m2) 1.9   Physical Exam:  General/Skin/HEENT:  General normal   Skin normal   Eyes normal   ENMT normal   Head and Neck normal   Additional PE well-developed female in NAD. Lungs are clear to A&P. Cardiac examination shows regular rate and rhythm abdomen is benign positive bowel sounds all 4 quadrants. No peripheral adenopathy is identified. No peripheral edema in her lower extremities is  noted. Rectal exam was not performed today   Breasts/Resp/CV/GI/GU:  Respiratory and Thorax normal   Cardiovascular normal   Gastrointestinal normal   Genitourinary normal   MS/Neuro/Psych/Lymph:  Musculoskeletal normal   Neurological normal   Lymphatics normal   Other Results:  Radiology Results: LabUnknown:     08-Jun-15 11:13, PET/CT Scan Colorectal Cancer Initial Stage  PACS Image   Nuclear Med:  PET/CT Scan Colorectal Cancer Initial Stage   REASON FOR EXAM:    REctal Mass/Staging  COMMENTS:       PROCEDURE: PET - PET/CT INIT STAGING COLORECTAL  - Dec 27 2013 11:13AM     CLINICAL DATA:  Initial treatment strategy for rectal mass.    EXAM:  NUCLEAR MEDICINE PET SKULL BASE TO THIGH    TECHNIQUE:  11.55 mCi F-18 FDG was injected intravenously. Full-ring PET imaging  was performed from the skull base to thigh after the radiotracer. CT  data was obtained and used for attenuation correction and anatomic  localization.  FASTING BLOOD GLUCOSE:  Value: 84 mg/dl    COMPARISON:  CT of the abdomen and pelvis 10/12/2010.    FINDINGS:  NECK    No hypermetabolic lymph nodes in the neck.    CHEST    Small focus of hypermetabolism within the posterior aspect of the  right lobe of the thyroid gland (SUVmax = 3.7), corresponding to a  1.4 x 1.2 cm low-attenuation nodule (image 58 of series 3). No  hypermetabolic mediastinal or hilar nodes. 6 mm subpleural nodule in  the inferior aspect of the left lower lobe (image 113 of series 3)  demonstrates no hypermetabolism (below the level of PET resolution).  Small left pleural effusion layering dependently. No other definite  suspicious pulmonary nodules on the CT scan. Borderline  cardiomegaly.    ABDOMEN/PELVIS    Large complex multilocular cystic appearing lesion in the left lobe  of the liver measuring at least 6.9 x 5.6 cm demonstrates no  definite internal hypermetabolism. No other focal hepatic lesions  are noted. Status post cholecystectomy. No abnormal hypermetabolic  activity within the pancreas, adrenal glands, or spleen. There is  mild left-sided hydroureteronephrosis and left perinephric stranding  related to mass effect upon the left ureterovesicular junction and  distal left ureter (see discussion below).    There is a very large a  no rectal mass, with epicenter in the distal  rectum, measuring up to approximately 10.1 x 10.1 cm in transaxial  dimension, demonstrating diffuse hypermetabolism (SUVmax = 30.6),  compatible with the reported primary rectal neoplasm. Several  prominent lymph nodes are noted in the mass the rectum, including a  8 mm right-sided lymph node (image 212 of series 3) that is  hypermetabolic (SUVmax = 2.7), and a 11 mm left-sided lymph node  (image 214 of series 3) there is also hypermetabolic (SUVmax = 2.9).  In addition, there are numerous borderline enlarged retroperitoneal  lymph nodes which demonstrate only low-level hypermetabolism, but  are highly suspicious for metastatic disease. This lymphadenopathy  extends up to the level of the suprarenal aorta anteriorly and on  the left side. The previously described rectal mass is exerting  significant mass effect on adjacent structures markedly displacing  the uterus and vagina anteriorly, also displacing the urinary  bladder anteriorly,resulting in some mass effect upon the distal  left ureter and left ureterovesicular junction, presumably  accounting for the left-sided hydroureteronephrosis. Some of the  hypermetabolism associated with the rectal mass appears intimately  associated with  the posterior wall of the vagina, which could  indicate direct extension. Trace volume of ascites. No  pneumoperitoneum. No pathologic distention of small bowel or colon.    SKELETON    No focal hypermetabolic activity to suggest skeletal metastasis.     IMPRESSION:  1. 10.1 x 10.1 cm distal rectal mass with surrounding hypermetabolic  mesorectal lymphadenopathy, and extensive retroperitoneal  lymphadenopathy, compatible with metastatic rectal cancer, as  detailed above. This mass exerts significant mass effect on adjacent  structures, displacing the uterus, vagina and bladder anteriorly.  The possibility of direct invasion through the posterior wall of  the  vagina is not excluded, and correlation with physical examination is  suggested.  2. 1.4 x 1.2 cm low-attenuation nodule in the right lobe of the  thyroid gland posteriorly demonstrates hypermetabolism. Further  evaluation with thyroid ultrasound is suggested, as a small primary  thyroid neoplasm could have this appearance.  3. 6 mm subpleural nodule in the inferior aspect of the left lower  lobe is nonspecific, but warrants continued attention on future  followup studies.  4. Small left-sided pleural effusion layering dependently. As an  isolated finding, this is of uncertain etiology and significance,  but correlation with image guided thoracentesis may be warranted to  exclude malignant pleural involvement if clinically appropriate.  5. Large multilocular cystic appearing lesion in the left lobe of  the liver demonstrates no definite internal hypermetabolism. This  may simply represent a complex hepatic cyst, but is unusual in  appearance, and correlation with nonemergent MRI of the abdomen with  and without IV gadolinium in the near future would be useful to  better characterize this lesion.  6. Mild left-sided hydroureteronephrosis related to mass effect upon  the distal left ureter and left ureterovesicular junction.  7. Trace volume of ascites.  8. Status post cholecystectomy.  9. Additional findings, as above.  Electronically Signed    By: Vinnie Langton M.D.    On: 12/27/2013 12:03         Verified By: Etheleen Mayhew, M.D.,   Relevent Results:   Relevant Scans and Labs PET CT scan is reviewed compatible above-stated findings   Assessment and Plan: Impression:   stage III (T3, N2, M0) adenocarcinoma of the rectum in 49 year old female for neoadjuvant chemoradiation prior to surgical resection Plan:   as we discussed at tumor conference patient would be a candidate for neoadjuvant chemoradiation prior to surgical resection. I will plan to deliver 4500 cGy to  her whole pelvis. Boosting the lesion up to 5400 cGy of all small bowel can be excluded from her treatment fields. Risks and benefits of treatment including diarrhea dysuria fatigued alteration of blood counts and skin reaction all were described in detail to the patient. She seems to comprehend my treatment plan well. I have set her up for early next week for simulation and will try to have her treatment started within the next week with concurrent chemotherapy. She is planning to go to the beach for a week and that will interrupt her treatments although I rather start treatments sooner than later based on the probability with this large rectal lesion she can obstruct or again brought into bleeding problems.  I would like to take this opportunity for allowing me to participate in the care of your patient..  CC Referral:  cc: Dr. Debbora Dus   Electronic Signatures: Baruch Gouty Roda Shutters (MD)  (Signed 12-Jun-15 11:29)  Authored: HPI, Diagnosis, Past Hx, PFSH, Allergies, Home Meds, ROS,  Nursing Notes, Physical Exam, Other Results, Relevent Results, Encounter Assessment and Plan, CC Referring Physician   Last Updated: 12-Jun-15 11:29 by Armstead Peaks (MD)

## 2014-11-12 NOTE — Consult Note (Signed)
Chief Complaint:  Subjective/Chief Complaint seen for anemia and heme positive stool.  currently being tfx again.  no evidence of active GI bleeding. denies nausea and abdominal pain.   VITAL SIGNS/ANCILLARY NOTES: **Vital Signs.:   05-Jun-15 12:44  Vital Signs Type 15 min Post Blood Start Time  Cels[place] 36.8  Temperature Source oral  Pulse Pulse 88  Respirations Respirations 20  Systolic BP Systolic BP 867  Diastolic BP (mmHg) Diastolic BP (mmHg) 74  Mean BP 88  Pulse Ox % Pulse Ox % 92  Oxygen Delivery Room Air/ 21 %   Brief Assessment:  Cardiac Regular   Respiratory clear BS   Gastrointestinal details normal Soft  Nontender  Nondistended  No masses palpable  Bowel sounds normal   Lab Results: Routine Chem:  05-Jun-15 04:18   Glucose, Serum 86  BUN 8  Creatinine (comp) 0.73  Sodium, Serum  135  Potassium, Serum 3.5  Chloride, Serum 104  CO2, Serum 22  Calcium (Total), Serum  7.3  Anion Gap 9  Osmolality (calc) 268  eGFR (African American) >60  eGFR (Non-African American) >60 (eGFR values <55m/min/1.73 m2 may be an indication of chronic kidney disease (CKD). Calculated eGFR is useful in patients with stable renal function. The eGFR calculation will not be reliable in acutely ill patients when serum creatinine is changing rapidly. It is not useful in  patients on dialysis. The eGFR calculation may not be applicable to patients at the low and high extremes of body sizes, pregnant women, and vegetarians.)  Routine Hem:  05-Jun-15 04:18   WBC (CBC)  15.9  RBC (CBC)  3.41  Hemoglobin (CBC)  6.9  Hematocrit (CBC)  22.6  Platelet Count (CBC)  584  MCV  66  MCHC  30.5  RDW  32.7  Neutrophil % 86.4  Lymphocyte % 6.1  Monocyte % 7.3  Eosinophil % 0.0  Basophil % 0.2  Neutrophil #  13.7  Lymphocyte # 1.0  Monocyte #  1.2  Eosinophil # 0.0  Basophil # 0.0 (Result(s) reported on 24 Dec 2013 at 05:41AM.)   Assessment/Plan:  Assessment/Plan:  Assessment 1)  anemia, microcytic, heme positive stool.  hgb 6.9 this am, getting 5th unit prbc today.  no active bleeding.  positive nsaid use.  2) UTI, low grade fever today, still elevated wbc though improving.   Plan 1) egd and colonoscopy when clinically feasible.  EGD possible tomorrow?  Dr WAllen Norriscovering over the weekend.   Electronic Signatures: SLoistine Simas(MD)  (Signed 05-Jun-15 16:15)  Authored: Chief Complaint, VITAL SIGNS/ANCILLARY NOTES, Brief Assessment, Lab Results, Assessment/Plan   Last Updated: 05-Jun-15 16:15 by SLoistine Simas(MD)

## 2014-11-12 NOTE — Consult Note (Signed)
Chief Complaint:  Subjective/Chief Complaint seen for anemia and heme positive stool, chart reviewed, results noted. Paitent ate a regular diet this pm, no nausea or abdominal pain.   VITAL SIGNS/ANCILLARY NOTES: **Vital Signs.:   08-Jun-15 12:46  Vital Signs Type 15 min Post Blood Start Time  Celsius 37.1  Pulse Pulse 85  Respirations Respirations 20  Systolic BP Systolic BP 102  Diastolic BP (mmHg) Diastolic BP (mmHg) 74  Mean BP 91  BP Source  if not from Vital Sign Device non-invasive  Pulse Ox % Pulse Ox % 94  Oxygen Delivery Room Air/ 21 %   Brief Assessment:  Cardiac Regular   Respiratory normal resp effort   Gastrointestinal details normal Soft  Nontender  Nondistended  No masses palpable  Bowel sounds normal   Lab Results: Routine BB:  03-Jun-15 01:30   Crossmatch Unit 1 Transfused  Result(s) reported on 25 Dec 2013 at 07:25AM.    13:05   Crossmatch Unit 1 Transfused  Crossmatch Unit 2 Transfused  Crossmatch Unit 3 Transfused  Crossmatch Unit 4 Transfused  Result(s) reported on 24 Dec 2013 at 07:36AM.  08-Jun-15 08:31   ABO Group + Rh Type B Positive  Antibody Screen NEGATIVE (Result(s) reported on 27 Dec 2013 at 10:13AM.)  Crossmatch Unit 1 Ready  Routine Hem:  03-Jun-15 13:05   Hemoglobin (CBC)  4.1    22:50   Hemoglobin (CBC)  5.4 (Result(s) reported on 22 Dec 2013 at 11:31PM.)  04-Jun-15 15:23   Hemoglobin (CBC)  7.2  05-Jun-15 04:18   Hemoglobin (CBC)  6.9  06-Jun-15 04:49   Hemoglobin (CBC)  7.5  07-Jun-15 04:48   Hemoglobin (CBC)  7.1  08-Jun-15 04:52   WBC (CBC) 9.1  RBC (CBC)  3.30  Hemoglobin (CBC)  6.9  Hematocrit (CBC)  23.0  Platelet Count (CBC)  493  MCV  70  MCH  20.9  MCHC  30.0  RDW  33.4  Neutrophil % 85.4  Lymphocyte % 6.4  Monocyte % 6.4  Eosinophil % 1.3  Basophil % 0.5  Neutrophil #  7.8  Lymphocyte #  0.6  Monocyte # 0.6  Eosinophil # 0.1  Basophil # 0.0 (Result(s) reported on 27 Dec 2013 at 06:19AM.)    Radiology Results: Nuclear Med:    08-Jun-15 11:13, PET/CT Scan Colorectal Cancer Initial Stage  PET/CT Scan Colorectal Cancer Initial Stage   REASON FOR EXAM:    REctal Mass/Staging  COMMENTS:       PROCEDURE: PET - PET/CT INIT STAGING COLORECTAL  - Dec 27 2013 11:13AM     CLINICAL DATA:  Initial treatment strategy for rectal mass.    EXAM:  NUCLEAR MEDICINE PET SKULL BASE TO THIGH    TECHNIQUE:  11.55 mCi F-18 FDG was injected intravenously. Full-ring PET imaging  was performed from the skull base to thigh after the radiotracer. CT  data was obtained and used for attenuation correction and anatomic  localization.  FASTING BLOOD GLUCOSE:  Value: 84 mg/dl    COMPARISON:  CT of the abdomen and pelvis 10/12/2010.    FINDINGS:  NECK    No hypermetabolic lymph nodes in the neck.    CHEST    Small focus of hypermetabolism within the posterior aspect of the  right lobe of the thyroid gland (SUVmax = 3.7), corresponding to a  1.4 x 1.2 cm low-attenuation nodule (image 58 of series 3). No  hypermetabolic mediastinal or hilar nodes. 6 mm subpleural nodule in  the inferior aspect  of the left lower lobe (image 113 of series 3)  demonstrates no hypermetabolism (below the level of PET resolution).  Small left pleural effusion layering dependently. No other definite  suspicious pulmonary nodules on the CT scan. Borderline  cardiomegaly.    ABDOMEN/PELVIS    Large complex multilocular cystic appearing lesion in the left lobe  of the liver measuring at least 6.9 x 5.6 cm demonstrates no  definite internal hypermetabolism. No other focal hepatic lesions  are noted. Status post cholecystectomy. No abnormal hypermetabolic  activity within the pancreas, adrenal glands, or spleen. There is  mild left-sided hydroureteronephrosis and left perinephric stranding  related to mass effect upon the left ureterovesicular junction and  distal left ureter (see discussion below).    There is a  very large a no rectal mass, with epicenter in the distal  rectum, measuring up to approximately 10.1 x 10.1 cm in transaxial  dimension, demonstrating diffuse hypermetabolism (SUVmax = 30.6),  compatible with the reported primary rectal neoplasm. Several  prominent lymph nodes are noted in the mass the rectum, including a  8 mm right-sided lymph node (image 212 of series 3) that is  hypermetabolic (SUVmax = 2.7), and a 11 mm left-sided lymph node  (image 214 of series 3) there is also hypermetabolic (SUVmax = 2.9).  In addition, there are numerous borderline enlarged retroperitoneal  lymph nodes which demonstrate only low-level hypermetabolism, but  are highly suspicious for metastatic disease. This lymphadenopathy  extends up to the level of the suprarenal aorta anteriorly and on  the left side. The previously described rectal mass is exerting  significant mass effect on adjacent structures markedly displacing  the uterus and vagina anteriorly, also displacing the urinary  bladder anteriorly,resulting in some mass effect upon the distal  left ureter and left ureterovesicular junction, presumably  accounting for the left-sided hydroureteronephrosis. Some of the  hypermetabolism associated with the rectal mass appears intimately  associated with the posterior wall of the vagina, which could  indicate direct extension. Trace volume of ascites. No  pneumoperitoneum. No pathologic distention of small bowel or colon.    SKELETON    No focal hypermetabolic activity to suggest skeletal metastasis.     IMPRESSION:  1. 10.1 x 10.1 cm distal rectal mass with surrounding hypermetabolic  mesorectal lymphadenopathy, and extensive retroperitoneal  lymphadenopathy, compatible with metastatic rectal cancer, as  detailed above. This mass exerts significant mass effect on adjacent  structures, displacing the uterus, vagina and bladder anteriorly.  The possibility of direct invasion through the  posterior wall of the  vagina is not excluded, and correlation with physical examination is  suggested.  2. 1.4 x 1.2 cm low-attenuation nodule in the right lobe of the  thyroid gland posteriorly demonstrates hypermetabolism. Further  evaluation with thyroid ultrasound is suggested, as a small primary  thyroid neoplasm could have this appearance.  3. 6 mm subpleural nodule in the inferior aspect of the left lower  lobe is nonspecific, but warrants continued attention on future  followup studies.  4. Small left-sided pleural effusion layering dependently. As an  isolated finding, this is of uncertain etiology and significance,  but correlation with image guided thoracentesis may be warranted to  exclude malignant pleural involvement if clinically appropriate.  5. Large multilocular cystic appearing lesion in the left lobe of  the liver demonstrates no definite internal hypermetabolism. This  may simply represent a complex hepatic cyst, but is unusual in  appearance, and correlation with nonemergent MRI of the abdomen  with  and without IV gadolinium in the near future would be useful to  better characterize this lesion.  6. Mild left-sided hydroureteronephrosis related to mass effect upon  the distal left ureter and left ureterovesicular junction.  7. Trace volume of ascites.  8. Status post cholecystectomy.  9. Additional findings, as above.  Electronically Signed    By: Vinnie Langton M.D.    On: 12/27/2013 12:03         Verified By: Etheleen Mayhew, M.D.,   Assessment/Plan:  Assessment/Plan:  Assessment 1) anemia heme positive stool-anorectal mass, gastric ulcer. Pathology still pending on rectal mass biopsy.  Will order h. pylori for further qualification of gastric ulcer, on bid ppi. PET result noted, surgery and H/O following. TFX noted.  2)UTI-on tx.   Plan 1) as above, following.   Electronic Signatures: Loistine Simas (MD)  (Signed 08-Jun-15 15:43)  Authored:  Chief Complaint, VITAL SIGNS/ANCILLARY NOTES, Brief Assessment, Lab Results, Radiology Results, Assessment/Plan   Last Updated: 08-Jun-15 15:43 by Loistine Simas (MD)

## 2014-11-12 NOTE — Consult Note (Signed)
PATIENT NAME:  Tammie Robinson, IGLESIA MR#:  [id number] DATE OF BIRTH:  [birth year]  DATE OF CONSULTATION:  12/22/2013  REFERRING PHYSICIAN:  Belia Heman. Verdell Carmine, MD CONSULTING PHYSICIAN:  Theodore Demark, NP  REASON FOR CONSULTATION: To evaluate for possible GI bleed, possible acute blood loss anemia.   HISTORY OF PRESENT ILLNESS: I appreciate consult for 49 year old Caucasian woman admitted with hemoglobin of 4.1 and low iron studies for evaluation of possible GI bleeding and anemia. The patient reports that she had flulike symptoms over the last week to 10 days since helped at primary care provider office for dysuria. Reports that the nurse practitioner there noted she looked pale so drew labs, then referred down to the Emergency Department because of her low hemoglobin. This confirmed the anemia. The patient states history of chronic constipation, approximately 2 bowel movements a week but brown. Occasionally sees BRB on toilet paper only that she attributes to hemorrhoids. Reports decreased appetite over the last week but denies GERD symptoms, problems swallowing, abdominal pain, nausea, vomiting, diarrhea, and all further GI complaints. Denies hematuria, bruising, easy bleeding and further signs of bleeding. States her menses flow is decreasing as she ages. Does admit to regular ibuprofen use over the last few months. Will take 800 mg p.o. up to t.i.d. Had cholecystectomy in 2012, ERCP for choledocholithiasis in 2012. No history of other luminal type evaluation. Has pantoprazole ordered. Currently receiving PRBCs. Daughter had leukemia. Denies family history of colorectal cancer, colon polyps, liver disease,  ulcers, IBD, celiac disease. Also noted she has a fever of 101.5 and current urinary tract infection with ciprofloxacin being ordered.   REVIEW OF SYSTEMS: Ten systems reviewed. Significant for reports of fatigue, weakness, weight loss of 30 pounds over the last 18 months. Otherwise unremarkable    PAST MEDICAL HISTORY: Hypertension, depression, recent UTI, cholecystectomy, ERCP for choledocholithiasis.   ALLERGIES: LISINOPRIL.   SOCIAL HISTORY: Married, lives with husband. No tobacco, alcohol, illicits.   FAMILY HISTORY: Significant for diabetes, hypertension, renal disease, heart disease, leukemia.   CURRENT MEDICATIONS: Norvasc 5 mg p.o. daily, ciprofloxacin 250 mg b.i.d., Pyridium 200 mg t.i.d.   LABORATORY DATA:  Most recent: Glucose 98, BUN 7, creatinine 0.92, sodium 136, potassium 3.1, chloride 102, GFR greater than 60. Iron 10, TIBC 185, UIBC 175, iron saturation 5. Calcium 7.4, total protein 6.4, albumin 1.6, total bilirubin 0.5, alkaline phosphatase 113, AST 18, ALT 10. WBC 18.4, hemoglobin 4.1, hematocrit 14.8, platelet count 718. Red cells are significantly microcytic. Urinalysis does demonstrate positive leukocytes, nitrites, white and red blood cells, and 3+ bacteria.   PHYSICAL EXAMINATION: VITAL SIGNS: Most recent: Temperature 101.5, pulse 99, respiratory rate 14, blood pressure 122/77, SaO2 of 97% on room air.  GENERAL: Pleasant woman lying in bed in no acute distress.  HEENT: Normocephalic, atraumatic. Conjunctivae are pale. Mucous membranes are moist. Sclerae clear.  NECK: Supple. No JVD, lymphadenopathy, thyromegaly.  CARDIAC: S1, S2. Do note grade II/VI systolic murmur heard best at the aortic, pulmonic, right sternal border. No gallop. No thrill.  LUNGS: Respirations eupneic. CTAB.  ABDOMEN: Bowel sounds x 4, nondistended, nontender. No guarding, rigidity, hepatosplenomegaly, masses, or other abnormalities such as peritoneal signs.  RECTAL: Heme-positive. Do note small hemorrhoid anteriorly. Posteriorly, there is a white pearly type lesion that protrudes probably 2 or 3 mm; it is nontender.  SKIN: Warm, dry, pale. No erythema, lesion or rash. However, she does have a large irregular reddened area to her back that she identifies as a birth mark.  PSYCHIATRIC:  Pleasant, calm, cooperative.  NEUROLOGICAL: Alert, oriented x 3. Cranial nerves II through XII intact. Speech clear. No facial droop.   IMPRESSION AND PLAN: Profound anemia, likely a slow loss with heme-positive rectal exam result. Agree with transfusions, pantoprazole. Have explained GI side effects of NSAIDs and discouraged these. Do recommend luminal evaluation if clinically feasible, to start with EGD due to possibility of NSAID-related ulcer. Also agree with treatment for urinary tract infection and recommend correcting her electrolytes. Would also follow her hemoglobin as you are. Thank.  You very much for this consult.   These services were provided by Stephens November, MSN, [facility], in collaboration with Lollie Sails, MD, with whom I discussed this patient in full.   ____________________________ Theodore Demark, NP chl:jcm D: 12/22/2013 17:50:10 ET T: 12/22/2013 18:07:43 ET JOB#: [id number]  cc: Theodore Demark, NP, <Dictator> [place] SIGNED 12/29/2013 14:29

## 2014-11-12 NOTE — Discharge Summary (Signed)
PATIENT NAME:  Tammie Robinson, Tammie Robinson MR#:  [id number] DATE OF BIRTH:  Oct 06, 1965  DATE OF ADMISSION:  12/22/2013 DATE OF DISCHARGE:  12/28/2013  For a detailed note, please take a look at the history and physical done on admission.   DIAGNOSES AT DISCHARGE: As follows:  1. Rectal mass, likely rectal cancer.  2. Gastrointestinal bleed secondary to the rectal mass.  3. Severe iron deficiency anemia and microcytic anemia due to the gastrointestinal bleed.  4. Urinary tract infection.  5. Hypertension.   The patient is being discharged on a low-sodium diet.   ACTIVITY: As tolerated.   FOLLOWUP: With Dr. Kallie Edward from hematology/oncology, in the next 1-2 days.  The patient is to also follow up with her primary care physician at [facility] with Dr. Ronette Deter in the next 1-2 weeks.   DISCHARGE MEDICATIONS: Are as follows: Amlodipine 5 mg daily, Ceftin 250 mg b.i.d. x 3 days, and iron sulfate 325 mg b.i.d.   CONSULTANTS DURING THE HOSPITAL COURSE: Dr. Gustavo Lah and Dr. Lucilla Lame from gastroenterology, Dr. Rexene Edison from general surgery, Dr. Kallie Edward from hematology/oncology.   PERTINENT STUDIES DONE DURING THE HOSPITAL COURSE: Are as follows:  The upper GI endoscopy done on June 6th showing normal esophagus, gastric ulcer with clean base. Normal examined duodenum.   Colonoscopy done on 12/26/2013, showing a rectal mass 4 cm from the anal verge.   HOSPITAL COURSE: This is a 49 year old female who was referred from her primary care physician's office, noted to be pale and noted to be severely anemic with a hemoglobin of as low as 4.1.  1. GI bleed. The patient was admitted to the hospital with a hemoglobin of 4.1. Suspected to be secondary to a GI bleed as she was heme positive. The patient was transfused a total of 5-6 units of blood while in the hospital and hemoglobin has improved posttransfusion. The patient was seen by gastroenterology, underwent a workup including a colonoscopy  and endoscopy. The endoscopy showed an ulcer but with no acute bleeding. Colonoscopy did show a rectal mass, which was likely the cause of her GI bleed and anemia. The patient underwent a PET scan for staging of her cancer which showed that the cancer was localized to the colon with some lymphadenopathy. Therefore, presently the patient is being set up with followup with oncology as an outpatient for radiation chemotherapy prior to having surgical treatment for her cancer. Her hemoglobin was 7.6 on the day of discharge.  2. Rectal mass. This was likely cancer, likely rectal cancer, but the pathology report is still pending. The patient's PET scan showed localized rectal mass with lymphadenopathy but no distal metastatic disease. The patient was seen by oncology and is scheduled to see Dr. Kallie Edward and also Dr. Donella Stade from radiation Oncology  as an outpatient coming up in the next 1 to 2 weeks for a possible initiation of chemotherapy radiation prior to getting surgical treatment for her rectal cancer.  3. Anemia. This was a severe microcytic anemia and iron deficient anemia secondary likely to the rectal mass. The patient was transfused multiple times in the hospital. Hemoglobin on the day of discharge is 7.6 and stable. I did discharge on iron supplements. If she needs further transfusion, she can get it done at the cancer center.  4. Fever of unknown origin. I suspect the likely cause of her fevers was related to her underlying malignancy. She did have an underlying UTI when she presented to the hospital, which was adequately treated.  Her blood cultures remained negative. Her fever curve improved which is as needed Tylenol. There was some concern initially that she may be having a transfusion reaction, although that was ruled out and the patient did not have any further rashes or complications with transfusion.  Patient presently is being discharged on oral Ceftin to finish treatment for her underlying UTI.   5. Urinary tract infection. The patient's urine cultures while in the hospital remained negative. Although in the PCPs office, they were growing 100,000 colonies of gram-negative rod, but they still have not been identified yet. I did discharge on oral Ceftin as mentioned. She has been afebrile and clinically doing well from that standpoint over the past 48-72 hours.  6. Hypertension. The patient remained hemodynamically stable. She will continue her Norvasc as stated.   CODE STATUS: The patient is a full code.   DISPOSITION:  She was discharged home.   TIME SPENT: 45 minutes.     ____________________________ Belia Heman. Verdell Carmine, MD vjs:dd D: 12/28/2013 17:13:29 ET T: 12/28/2013 20:57:31 ET JOB#: [id number]  cc: Belia Heman. Verdell Carmine, MD, <Dictator> Rae Halsted. Kallie Edward, MD Eduard Clos Gilford Rile, MD   Henreitta Leber MD ELECTRONICALLY SIGNED 01/18/2014 20:28

## 2014-11-12 NOTE — Consult Note (Signed)
PET results noted. Will further discuss with Dr. Baruch Gouty and Dr. Kallie Edward as to whether to persue neo-adjuvant chemo/xrt or proceed directly to surgery. Either way, patient will require port placement.  CEA pending.  Case d/w Dr. Marina Gravel. Follow.  Electronic Signatures: Delight Hoh (MD)  (Signed on 08-Jun-15 17:04)  Authored  Last Updated: 08-Jun-15 17:04 by Delight Hoh (MD)
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
## 2014-11-12 NOTE — Consult Note (Signed)
PATIENT NAME:  Tammie Robinson, Tammie Robinson MR#:  [id number] DATE OF BIRTH:  1966-05-07  DATE OF CONSULTATION:  12/26/2013  REFERRING PHYSICIAN:   CONSULTING PHYSICIAN:  Harrell Gave A. Cordarious Zeek, MD  REASON FOR CONSULT: Anemia and rectal mass.   HISTORY OF PRESENT ILLNESS: This patient is a pleasant 49 year old female who presented to the Emergency Room on June 3rd after being sent here from her primary care physician.  She was thought to have a urinary tract infection, started on antibiotics, and they checked routine labs.  Was noted to have a hemoglobin of 4.  She was given initially 4 units and responded not completely appropriately and received another unit. She underwent an upper GI which showed an ulcer yesterday and then a lower colonoscopy today which showed a large rectal mass at the most proximal extent 4 cm from the anus. Otherwise, has had a 30 pound weight loss over the last year and a half which is intentional. Otherwise, no fevers, chills, night sweats, shortness of breath, cough, chest pain, abdominal pain, nausea, vomiting, diarrhea, constipation, dysuria or hematuria.   PAST MEDICAL HISTORY: Hypertension, depression, urinary tract infection.   HOME MEDICATIONS: Norvasc, ciprofloxacin, Pyridium.   SOCIAL HISTORY: Denies tobacco, social alcohol use. Lives with her husband and is here with her today.   FAMILY HISTORY: Mother with diabetes, hypertension, renal disease. Father has had a history of a quadruple bypass. Has a daughter who is recently diagnosed with leukemia. No history of colon cancer.   ALLERGIES: LISINOPRIL WHICH CAUSES HIVES.   REVIEW OF SYSTEMS:  A 12-point review of systems was obtained. Pertinent positives and negatives as above.   PHYSICAL EXAMINATION:  VITAL SIGNS: Temperature 98.7, pulse 95, blood pressure 123/80, respirations 20, 93% on room air.   GENERAL: No acute distress. Alert and oriented x 3.  HEAD: Normocephalic, atraumatic.  EYES: No scleral icterus. No  conjunctivitis.  Face: No obvious facial trauma. Normal external nose. Normal external ears.  CHEST: Lungs clear to auscultation. Moving air well.  HEART: Regular rate and rhythm. No murmurs, rubs, or gallops.  ABDOMEN: Soft, nontender, nondistended.  RECTAL:  Exam was performed during colonoscopy which showed a mass. The most proximal extent was approximately 4 cm from the anal verge.  EXTREMITIES: Moves all extremities well. Strength 5/5.  NEUROLOGIC: Cranial nerves II through XII grossly intact.   LABORATORY DATA: At admission, hemoglobin was noted to be 4.1.  Did raise to 5.4 and as high as 7.2 after 4 units. Did slightly decrease, and after 1 more unit is 7.1 currently. White cell count of 12.0; 18.4 at admission. Platelets are 48,000. Albumin is 1.6. BMP is normal.  IMAGING DATA: No imaging has been performed on patient as of yet.   ASSESSMENT AND PLAN: This patient is a pleasant 49 year old female with a low rectal cancer. She will need to be worked up, would recommend oncology consult. Will need imaging either a PET scan or CT scan of the chest and abdomen. Will also require endoscopic ultrasound to examine depth.  Will likely require neoadjuvant and adjuvant chemotherapy and abdominal perineal resection. This does not necessarily need to be done while patient is in-house unless continues to bleed. We will continue to follow.    ____________________________ Glena Norfolk Alli Jasmer, MD cal:dd D: 12/26/2013 12:30:35 ET T: 12/26/2013 17:50:39 ET JOB#: [id number]  cc: Harrell Gave A. Charbel Los, MD, <Dictator> Floyde Parkins MD ELECTRONICALLY SIGNED 01/11/2014 10:48

## 2014-11-12 NOTE — Consult Note (Signed)
Brief Consult Note: Diagnosis: anemia.   Patient was seen by consultant.   Consult note dictated.   Comments: Appreciate consult for 49 y/o caucasian woman admitted with hgb of 4.1 and low Fe studies for evaluation of possible GI bleeding. Patient reports that she had flu like symptoms over the last week to 10d, then sought help at PCP office for dysuria. NP there noted she looked pale, so drew labs and referred to ED, which confirmed the anemia. Patient states history of chronic constipation: 2bm's/w but brown. Occasionally sees brpr on tp only that she attributes to hemorrhoids. Reports decreased appetite over the last week but denies gerd symptoms, problems swallowing, abdominal pain, NVD, and all further GI complaints. Denies hematuria, bruising, easy bleeding, and further signs of bleeding. States menses flow is decreasing as she ages. Does admit to regular Ibuprofen use over the lsat few months, will take 800mg  po up to tid. Had cholecystecomy 2012, ECRP for choledolithiasis 2012, No history of other luminal type evaluation. Has Pantoprazole ordered. Currently receiving prbcs.  Daughter had leukemia. Denies family history of colorectal cancer, colon polyps, liver disease pUD, IBD, celiac. Was heme positive on exam. Do note fever and UTI- has cipro ordered Impression/Plan. Profound anemia- likely a slow loss, with heme positive rectal exam result. Agree with transfusions, Pantoprazole. I have explained GI se's of NSAIDs and discouraged these. Do recommend lumninal evaluation as clnically feasible; to start with EGD due to possibility of NSAID related ulcer. Also agree with tx for uti, rec. correcting lytes.  Electronic Signatures: Stephens November H (NP)  (Signed 03-Jun-15 16:20)  Authored: Brief Consult Note   Last Updated: 03-Jun-15 16:20 by Theodore Demark (NP)

## 2014-11-12 NOTE — Op Note (Signed)
PATIENT NAME:  Tammie Robinson, Tammie Robinson MR#:  [id number] DATE OF BIRTH:  1965-08-27  DATE OF PROCEDURE:  01/10/2014  PREOPERATIVE DIAGNOSIS:  Colorectal cancer with poor venous access.   POSTOPERATIVE DIAGNOSIS:  Colorectal cancer with poor venous access.  PROCEDURES:  1.  Ultrasound guidance for vascular access, right internal jugular vein.  2.  Fluoroscopic guidance for placement of catheter.  3.  Placement of CT compatible Port-A-Cath, right internal jugular vein.   SURGEON:  Dr. Leotis Pain.   ANESTHESIA:  Local with moderate conscious sedation.   FLUOROSCOPY TIME:  Less than 1 minute.   CONTRAST:  Zero.   ESTIMATED BLOOD LOSS: Minimal.   INDICATION FOR PROCEDURE: A 49 year old female with colorectal cancer scheduled to start chemotherapy tomorrow. We are asked to place a Port-A-Cath for good durable venous access for chemotherapy. Risks and benefits were discussed. Informed consent was obtained.   DESCRIPTION OF THE PROCEDURE:  The patient was brought to the vascular and interventional radiology suite. The right neck and chest were sterilely prepped and draped, and a sterile surgical field was created. Ultrasound was used to help visualize a patent right internal jugular vein. This was then accessed under direct ultrasound guidance without difficulty with a Seldinger needle and a permanent image was recorded. A J-wire was placed. After skin nick and dilatation, the peel-away sheath was then placed over the wire. I then anesthetized an area under the clavicle approximately 2 fingerbreadths. A transverse incision was created and an inferior pocket was created with electrocautery and blunt dissection. The port was then brought onto the field, placed into the pocket and secured to the chest wall with 2 Prolene sutures. The catheter was connected to the port and tunneled from the subclavicular incision to the access site. Fluoroscopic guidance was used to cut the catheter to an appropriate length. The  catheter was then placed through the peel-away sheath and the peel-away sheath was removed. The catheter tip was parked in excellent location in the cavoatrial junction. The pocket was then irrigated with antibiotic-impregnated saline and the wound was closed with a running 3-0 Vicryl and a 4-0 Monocryl. The access incision was closed with a single 4-0 Monocryl. The Huber needle was used to withdraw blood and flush the port with heparinized saline. Dermabond was then placed as a dressing. The patient tolerated the procedure well and was taken to the recovery room in stable condition.    ____________________________ Algernon Huxley, MD jsd:lt D: 01/10/2014 17:44:00 ET T: 01/11/2014 02:57:21 ET JOB#: [id number]  cc: Algernon Huxley, MD, <Dictator> Algernon Huxley MD ELECTRONICALLY SIGNED 01/24/2014 15:06

## 2014-11-12 NOTE — H&P (Signed)
PATIENT NAME:  Tammie Robinson, Tammie Robinson MR#:  [id number] DATE OF BIRTH:  1965/11/08  DATE OF ADMISSION:  12/22/2013  PRIMARY CARE PHYSICIAN: Anderson Malta A. Gilford Rile, MD  CHIEF COMPLAINT: Abnormal labs and fatigue.   HISTORY OF PRESENT ILLNESS: This is a 49 year old female who presents from her primary care physician's office due to abnormal labs and noted to be acutely anemic. The patient went to see her primary care physician today, as she thought she had a urinary tract infection. She was started on ciprofloxacin and Pyridium. They checked some routine labs, as she looked quite pale, and she was noted to have a hemoglobin of 4. As per the husband, the patient has been looking pale now for the past month or so. He did advise the patient that she may be anemic, but she did not go see a physician. Today when she went for treatment for a UTI, she was noted to be acutely anemic and sent over to the ER. In the Emergency Room, the patient was noted to have a hemoglobin of 4.1. Hospitalist services were contacted for further treatment and evaluation. The patient denies any evidence of acute bleeding or any acute melena, any hematochezia or any hematuria, or any even vaginal bleeding. She does say that she has a history of hemorrhoids, and she has had an occasional stool that has been mildly bloody when she wipes, but otherwise no evidence of acute bleeding. She admits to about a 30-pound weight loss over the past year and a half, but that is intentional. She denies any night sweats or any other associated symptoms presently.   REVIEW OF SYSTEMS:    CONSTITUTIONAL: No documented fever. Positive fatigue and weakness. Positive weight loss, about 30 pounds over the past year and a half. No weight gain.  EYES: No blurry or double vision.  EARS, NOSE, THROAT: No tinnitus. No postnasal drip. No redness of the oropharynx.  RESPIRATORY: No cough, no wheeze, no hemoptysis, no dyspnea.  CARDIOVASCULAR: No chest pain, no orthopnea,  no palpitations, no syncope.  GASTROINTESTINAL: No nausea. No vomiting. No diarrhea. No abdominal pain. No melena or hematochezia.  GENITOURINARY: No dysuria or hematuria.  ENDOCRINE: No polyuria, nocturia, heat or cold intolerance.  HEMATOLOGIC: Positive anemia. Positive acute bleeding. No bruising.  INTEGUMENTARY: No rashes. No lesions.  MUSCULOSKELETAL: No arthritis. No swelling. No gout.  NEUROLOGIC: No numbness. No tingling. No ataxia. No seizure-type activity.  PSYCHIATRIC: No anxiety. No insomnia. No ADD.   PAST MEDICAL HISTORY: Consistent with hypertension, depression, recent urinary tract infection.   ALLERGIES: LISINOPRIL, WHICH CAUSES HIVES.   SOCIAL HISTORY: No smoking. Occasional alcohol use. No illicit drug abuse. Lives at home with her husband.   FAMILY HISTORY: Mother and father are both alive. Mother has diabetes, hypertension, renal disease. Father has heart disease and has had a bypass. One of her daughters has leukemia.   CURRENT MEDICATIONS: As follows: Norvasc 5 mg daily, ciprofloxacin 250 mg b.i.d., and Pyridium 200 mg t.i.d.   PHYSICAL EXAMINATION: Presently is as follows:  VITAL SIGNS: Temperature is 98.6, pulse 101, respirations 20, blood pressure 144/76, sats 100% on room air.  GENERAL: She is a pale-appearing female but in no apparent distress.  HEAD, EYES, EARS, NOSE, THROAT: Atraumatic, normocephalic. Extraocular muscles are intact. Pupils equal and reactive to light. Sclerae are anicteric. No conjunctival injection. No pharyngeal erythema.  NECK: Supple. There is no jugular venous distention. No bruits, lymphadenopathy or thyromegaly.  HEART: Regular rate and rhythm. She does have a II/VI  systolic ejection murmur heard at the right sternal border. No rubs, no clicks.  LUNGS: Clear to auscultation bilaterally. No rales, no rhonchi, no wheezes.  ABDOMEN: Soft, flat, nontender, nondistended. Has good bowel sounds. No hepatosplenomegaly appreciated.   EXTREMITIES: No evidence of any cyanosis, clubbing or peripheral edema. Has +2 pedal and radial pulses bilaterally.  NEUROLOGICAL: The patient is alert, awake, oriented x 3 with no focal motor or sensory deficits appreciated bilaterally.  SKIN: Moist and warm with no rashes appreciated.  LYMPHATIC: There is no cervical or axillary lymphadenopathy.   LABORATORY DATA: Serum glucose of 98, BUN 7, creatinine 0.9, sodium 136, potassium 3.1, chloride 102, bicarbonate 26. LFTs showed albumin of 1.6, otherwise within normal limits. White cell count 18.4, hemoglobin 4.1, hematocrit 14.8, platelet count of 719, MCV of 53.   ASSESSMENT AND PLAN: This is a 49 year old female with a history of hypertension, depression, recent diagnosis of urinary tract infection, presented to the hospital due to acute anemia. The patient was called by her primary care physician's office and told that her labs are abnormal, and she came to the ER and was noted to have a hemoglobin of 4.1.  1.  Gastrointestinal bleed: The patient is here with acute anemia and a heme-positive stool; therefore, likely gastrointestinal bleed is the cause of her symptoms. I suspect this is a slow upper gastrointestinal bleed, although the patient denies any melena or hematochezia. Will transfuse her 2 units of packed red blood cells. Follow serial hemoglobins. Will get a GI consult. For now, I will start her on b.i.d. Protonix.  2.  Anemia: This is a microcytic anemia, likely acute blood loss anemia from the gastrointestinal bleed. She will be transfused 2 units of packed red blood cells. I will follow her hemoglobin. Will check iron, TIBC, ferritin, folate, and B12 levels.  3.  Urinary tract infection: I will continue her Cipro and Pyridium.  4.  Hypertension: I will continue her Norvasc.   CODE STATUS: The patient is a full code.   TIME SPENT: 50 minutes.    ____________________________ Belia Heman. Verdell Carmine, MD vjs:jcm D: 12/22/2013 14:20:44  ET T: 12/22/2013 15:00:56 ET JOB#: [id number]  cc: Belia Heman. Verdell Carmine, MD, <Dictator> Henreitta Leber MD ELECTRONICALLY SIGNED 12/25/2013 21:58

## 2014-11-12 NOTE — Consult Note (Signed)
Chief Complaint:  Subjective/Chief Complaint seen for anemia/IDA.  denies abdominalpain or nausea, feeling some better from yesterday.   VITAL SIGNS/ANCILLARY NOTES: **Vital Signs.:   04-Jun-15 12:15  Vital Signs Type Blood Transfusion Complete  Temperature Temperature (F) 98.8  Celsius 37.1  Temperature Source oral  Pulse Pulse 89  Respirations Respirations 22  Systolic BP Systolic BP 449  Diastolic BP (mmHg) Diastolic BP (mmHg) 76  Mean BP 91  Pulse Ox % Pulse Ox % 94  Oxygen Delivery Room Air/ 21 %  *Intake and Output.:   04-Jun-15 07:23  Stool  small loosely formed bm   Brief Assessment:  Cardiac Regular   Respiratory clear BS   Gastrointestinal details normal Soft  Nontender  Nondistended  Bowel sounds normal   Assessment/Plan:  Assessment/Plan:  Assessment 1) anemia, 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 heme positive stool.   Plan 1) EGD and colonoscopy when clinically  feasible, awaiting result of cbc.  Will do egd and then colonoscopy the following day. I have discussed wht risks benefits and compliocations of proceedure to include not limited to bleeding infection perforation and sedation and she wishes to proceed.   Electronic Signatures: Loistine Simas (MD)  (Signed 04-Jun-15 15:09)  Authored: Chief Complaint, VITAL SIGNS/ANCILLARY NOTES, Brief Assessment, Assessment/Plan   Last Updated: 04-Jun-15 15:09 by Loistine Simas (MD)

## 2014-11-12 NOTE — Consult Note (Signed)
History of Present Illness:  Reason for Consult Newly diagnosed rectal cancer   HPI   This patient is a pleasant 49 year old female who presented to the Emergency Room on June 3rd after being sent here from her primary care physician.  She was thought to have a urinary tract infection, started on antibiotics, and they checked routine labs.  Was noted to have a hemoglobin of 4.  She was given initially 4 units and responded not completely appropriately and received another unit. She underwent an upper GI which showed an ulcer yesterday and then a lower colonoscopy 12/22/13 which showed a large rectal mass at the most proximal extent 4 cm from the anus. Otherwise, has had a 30 pound weight loss over the last year and a half which is intentional. Otherwise, no fevers, chills, night sweats, shortness of breath, cough, chest pain, abdominal pain, nausea, vomiting, diarrhea, constipation, dysuria or hematuria. No melena or rectal bleeding.   PFSH:  Family History noncontributory   Social History negative alcohol, negative tobacco   Review of Systems:  General weakness  fatigue   Performance Status (ECOG) 2   HEENT no complaints   Lungs no complaints   Cardiac no complaints   GI no complaints   GU no complaints   Musculoskeletal no complaints   Extremities no complaints   Skin no complaints   Neuro no complaints   Endocrine no complaints   Psych no complaints   NURSING NOTES: Transfer Form (Outpatient in Facility):   08-Jun-15 15:34   Vital Signs Type: Blood Transfusion Complete   Temperature Temperature (F): 98.4   Temperature Source: oral   Pulse Pulse: 87   Systolic BP Systolic BP: 500   Diastolic BP (mmHg) Diastolic BP (mmHg): 79   Pulse Ox % Pulse Ox %: 94  **Vital Signs.:   09-Jun-15 08:01   Vital Signs Type: Q 8hr   Temperature Temperature (F): 98.5   Celsius: 36.9   Temperature Source: oral   Pulse Pulse: 77   Respirations Respirations: 17    Systolic BP Systolic BP: 938   Diastolic BP (mmHg) Diastolic BP (mmHg): 80   Mean BP: 94   Pulse Ox % Pulse Ox %: 97   Pulse Ox Activity Level: At rest   Oxygen Delivery: Room Air/ 21 %   Physical Exam:  General well looking female in no acute distress Pallor ++   HEENT: normal   Lungs: clear   Cardiac: regular rate, rhythm   Abdomen: soft  nontender  positive bowel sounds   Skin: intact   Extremities: No edema, rash or cyanosis   Neuro: AAOx3   Psych: normal appearance     asthma:    migraines:    gallbladder problems:    htn:    Cholecystectomy:    Lisinopril: Hives, Itching  Laboratory Results: Routine Chem:  05-Jun-15 04:18   Glucose, Serum 86  BUN 8  Creatinine (comp) 0.73  Sodium, Serum  135  Potassium, Serum 3.5  Chloride, Serum 104  CO2, Serum 22  Calcium (Total), Serum  7.3  Anion Gap 9  Osmolality (calc) 268  eGFR (African American) >60  eGFR (Non-African American) >60 (eGFR values <75m/min/1.73 m2 may be an indication of chronic kidney disease (CKD). Calculated eGFR is useful in patients with stable renal function. The eGFR calculation will not be reliable in acutely ill patients when serum creatinine is changing rapidly. It is not useful in  patients on dialysis. The eGFR calculation may not be applicable  to patients at the low and high extremes of body sizes, pregnant women, and vegetarians.)  Routine Hem:  05-Jun-15 04:18   WBC (CBC)  15.9  RBC (CBC)  3.41  Hemoglobin (CBC)  6.9  Hematocrit (CBC)  22.6  Platelet Count (CBC)  584  MCV  66  MCH  20.2  MCHC  30.5  RDW  32.7  Neutrophil % 86.4  Lymphocyte % 6.1  Monocyte % 7.3  Eosinophil % 0.0  Basophil % 0.2  Neutrophil #  13.7  Lymphocyte # 1.0  Monocyte #  1.2  Eosinophil # 0.0  Basophil # 0.0 (Result(s) reported on 24 Dec 2013 at 05:41AM.)   Radiology Results: Nuclear Med:    08-Jun-15 11:13, PET/CT Scan Colorectal Cancer Initial Stage  PET/CT Scan Colorectal  Cancer Initial Stage   REASON FOR EXAM:    REctal Mass/Staging  COMMENTS:       PROCEDURE: PET - PET/CT INIT STAGING COLORECTAL  - Dec 27 2013 11:13AM     CLINICAL DATA:  Initial treatment strategy for rectal mass.    EXAM:  NUCLEAR MEDICINE PET SKULL BASE TO THIGH    TECHNIQUE:  11.55 mCi F-18 FDG was injected intravenously. Full-ring PET imaging  was performed from the skull base to thigh after the radiotracer. CT  data was obtained and used for attenuation correction and anatomic  localization.  FASTING BLOOD GLUCOSE:  Value: 84 mg/dl    COMPARISON:  CT of the abdomen and pelvis 10/12/2010.    FINDINGS:  NECK    No hypermetabolic lymph nodes in the neck.    CHEST    Small focus of hypermetabolism within the posterior aspect of the  right lobe of the thyroid gland (SUVmax = 3.7), corresponding to a  1.4 x 1.2 cm low-attenuation nodule (image 58 of series 3). No  hypermetabolic mediastinal or hilar nodes. 6 mm subpleural nodule in  the inferior aspect of the left lower lobe (image 113 of series 3)  demonstrates no hypermetabolism (below the level of PET resolution).  Small left pleural effusion layering dependently. No other definite  suspicious pulmonary nodules on the CT scan. Borderline  cardiomegaly.    ABDOMEN/PELVIS    Large complex multilocular cystic appearing lesion in the left lobe  of the liver measuring at least 6.9 x 5.6 cm demonstrates no  definite internal hypermetabolism. No other focal hepatic lesions  are noted. Status post cholecystectomy. No abnormal hypermetabolic  activity within the pancreas, adrenal glands, or spleen. There is  mild left-sided hydroureteronephrosis and left perinephric stranding  related to mass effect upon the left ureterovesicular junction and  distal left ureter (see discussion below).    There is a very large a no rectal mass, with epicenter in the distal  rectum, measuring up to approximately 10.1 x 10.1 cm in  transaxial  dimension, demonstrating diffuse hypermetabolism (SUVmax = 30.6),  compatible with the reported primary rectal neoplasm. Several  prominent lymph nodes are noted in the mass the rectum, including a  8 mm right-sided lymph node (image 212 of series 3) that is  hypermetabolic (SUVmax = 2.7), and a 11 mm left-sided lymph node  (image 214 of series 3) there is also hypermetabolic (SUVmax = 2.9).  In addition, there are numerous borderline enlarged retroperitoneal  lymph nodes which demonstrate only low-level hypermetabolism, but  are highly suspicious for metastatic disease. This lymphadenopathy  extends up to the level of the suprarenal aorta anteriorly and on  the left side. The  previously described rectal mass is exerting  significant mass effect on adjacent structures markedly displacing  the uterus and vagina anteriorly, also displacing the urinary  bladder anteriorly,resulting in some mass effect upon the distal  left ureter and left ureterovesicular junction, presumably  accounting for the left-sided hydroureteronephrosis. Some of the  hypermetabolism associated with the rectal mass appears intimately  associated with the posterior wall of the vagina, which could  indicate direct extension. Trace volume of ascites. No  pneumoperitoneum. No pathologic distention of small bowel or colon.    SKELETON    No focal hypermetabolic activity to suggest skeletal metastasis.     IMPRESSION:  1. 10.1 x 10.1 cm distal rectal mass with surrounding hypermetabolic  mesorectal lymphadenopathy, and extensive retroperitoneal  lymphadenopathy, compatible with metastatic rectal cancer, as  detailed above. This mass exerts significant mass effect on adjacent  structures, displacing the uterus, vagina and bladder anteriorly.  The possibility of direct invasion through the posterior wall of the  vagina is not excluded, and correlation with physical examination is  suggested.  2. 1.4 x 1.2  cm low-attenuation nodule in the right lobe of the  thyroid gland posteriorly demonstrates hypermetabolism. Further  evaluation with thyroid ultrasound is suggested, as a small primary  thyroid neoplasm could have this appearance.  3. 6 mm subpleural nodule in the inferior aspect of the left lower  lobe is nonspecific, but warrants continued attention on future  followup studies.  4. Small left-sided pleural effusion layering dependently. As an  isolated finding, this is of uncertain etiology and significance,  but correlation with image guided thoracentesis may be warranted to  exclude malignant pleural involvement if clinically appropriate.  5. Large multilocular cystic appearing lesion in the left lobe of  the liver demonstrates no definite internal hypermetabolism. This  may simply represent a complex hepatic cyst, but is unusual in  appearance, and correlation with nonemergent MRI of the abdomen with  and without IV gadolinium in the near future would be useful to  better characterize this lesion.  6. Mild left-sided hydroureteronephrosis related to mass effect upon  the distal left ureter and left ureterovesicular junction.  7. Trace volume of ascites.  8. Status post cholecystectomy.  9. Additional findings, as above.  Electronically Signed    By: Vinnie Langton M.D.    On: 12/27/2013 12:03         Verified By: Etheleen Mayhew, M.D.,   Assessment and Plan: Impression:   this is a 49 year old female with newly diagnosed rectal cancer, TXN2M0-at least stage IIIplanned for 6/25/15plan of management would be chemoradiation prior to surgery. Due to the presence of clinically node-positive disease in this patient and that this is a rectal tumor for which an abdominoperineal resection (APR) is thought to be  there is decreased likelihood of achieving a tumor-free circumferential resection margin upfront surgery.chemoradiation is indicated in this patient and data from  randomized trials that the preoperative approach is associated with a more favorable long-term toxicity profile and fewer local recurrences postoperative therapy.  will be followed on an outpatient basis. see Dr. Donella Stade, and myself on June 12 to discuss further regarding, neoadjuvant planning to use oral Capecitabine 846m/m2 bid for weekdays during chemoradiation.aware to contact [place] any bleeding, abdominal pain, nausea, vomiting. return to clinic 3-4 days or sooner if any new symptoms or concernsappreciated. Plan:   as above  Electronic Signatures: RGeorges Mouse(MD)  (Signed 09-Jun-15 13:48)  Authored: HISTORY OF PRESENT ILLNESS, PFSH, ROS,  NURSING NOTES, PE, PAST MEDICAL HISTORY, ALLERGIES, LABS, OTHER RESULTS, ASSESSMENT AND PLAN   Last Updated: 09-Jun-15 13:48 by Georges Mouse (MD)

## 2014-11-12 NOTE — Consult Note (Signed)
Brief Consult Note: Diagnosis: REctal Cancer.   Discussed with Attending MD.   Comments: Patient will need staging with PET CT scan.Path pending. Depending on findings -may need EUS as well as possible chemoradiation if T3No. Discussed with DR Lundquist as well- appears non obstructing. May need diverting colostomy depending on PET staging results. Agree with aggresive transusuion. Will follow.  Electronic Signatures: Georges Mouse (MD)  (Signed 07-Jun-15 15:03)  Authored: Brief Consult Note   Last Updated: 07-Jun-15 15:03 by Georges Mouse (MD)

## 2014-11-14 LAB — CBC CANCER CENTER
BASOS PCT: 0.5 %
Basophil #: 0 x10 3/mm (ref 0.0–0.1)
Eosinophil #: 0.2 x10 3/mm (ref 0.0–0.7)
Eosinophil %: 2 %
HCT: 37.4 % (ref 35.0–47.0)
HGB: 12.4 g/dL (ref 12.0–16.0)
Lymphocyte #: 0.4 x10 3/mm — ABNORMAL LOW (ref 1.0–3.6)
Lymphocyte %: 4.7 %
MCH: 28.9 pg (ref 26.0–34.0)
MCHC: 33.2 g/dL (ref 32.0–36.0)
MCV: 87 fL (ref 80–100)
MONO ABS: 1.1 x10 3/mm — AB (ref 0.2–0.9)
MONOS PCT: 11.7 %
Neutrophil #: 7.5 x10 3/mm — ABNORMAL HIGH (ref 1.4–6.5)
Neutrophil %: 81.1 %
Platelet: 69 x10 3/mm — ABNORMAL LOW (ref 150–440)
RBC: 4.3 10*6/uL (ref 3.80–5.20)
RDW: 16.1 % — ABNORMAL HIGH (ref 11.5–14.5)
WBC: 9.3 x10 3/mm (ref 3.6–11.0)

## 2014-11-17 ENCOUNTER — Other Ambulatory Visit: Payer: Self-pay | Admitting: Internal Medicine

## 2014-11-17 DIAGNOSIS — C20 Malignant neoplasm of rectum: Secondary | ICD-10-CM

## 2014-11-21 ENCOUNTER — Inpatient Hospital Stay: Payer: Self-pay

## 2014-11-21 ENCOUNTER — Ambulatory Visit: Payer: Medicaid Other

## 2014-11-21 ENCOUNTER — Other Ambulatory Visit: Payer: Self-pay | Admitting: *Deleted

## 2014-11-21 ENCOUNTER — Encounter: Payer: Self-pay | Admitting: Internal Medicine

## 2014-11-21 ENCOUNTER — Inpatient Hospital Stay: Payer: Self-pay | Attending: Internal Medicine | Admitting: Internal Medicine

## 2014-11-21 ENCOUNTER — Ambulatory Visit: Payer: Medicaid Other | Admitting: Internal Medicine

## 2014-11-21 ENCOUNTER — Other Ambulatory Visit: Payer: Medicaid Other

## 2014-11-21 ENCOUNTER — Inpatient Hospital Stay: Payer: Self-pay | Admitting: *Deleted

## 2014-11-21 VITALS — BP 133/86 | HR 65 | Temp 98.5°F | Resp 18 | Ht 64.9 in | Wt 161.4 lb

## 2014-11-21 DIAGNOSIS — C20 Malignant neoplasm of rectum: Secondary | ICD-10-CM

## 2014-11-21 DIAGNOSIS — I10 Essential (primary) hypertension: Secondary | ICD-10-CM | POA: Insufficient documentation

## 2014-11-21 DIAGNOSIS — Z803 Family history of malignant neoplasm of breast: Secondary | ICD-10-CM | POA: Insufficient documentation

## 2014-11-21 DIAGNOSIS — Z79899 Other long term (current) drug therapy: Secondary | ICD-10-CM | POA: Insufficient documentation

## 2014-11-21 DIAGNOSIS — G893 Neoplasm related pain (acute) (chronic): Secondary | ICD-10-CM

## 2014-11-21 DIAGNOSIS — R200 Anesthesia of skin: Secondary | ICD-10-CM

## 2014-11-21 DIAGNOSIS — Z90710 Acquired absence of both cervix and uterus: Secondary | ICD-10-CM

## 2014-11-21 DIAGNOSIS — Z5111 Encounter for antineoplastic chemotherapy: Secondary | ICD-10-CM | POA: Insufficient documentation

## 2014-11-21 DIAGNOSIS — D696 Thrombocytopenia, unspecified: Secondary | ICD-10-CM | POA: Insufficient documentation

## 2014-11-21 DIAGNOSIS — Z8669 Personal history of other diseases of the nervous system and sense organs: Secondary | ICD-10-CM | POA: Insufficient documentation

## 2014-11-21 DIAGNOSIS — D509 Iron deficiency anemia, unspecified: Secondary | ICD-10-CM

## 2014-11-21 DIAGNOSIS — Z9049 Acquired absence of other specified parts of digestive tract: Secondary | ICD-10-CM

## 2014-11-21 DIAGNOSIS — R530 Neoplastic (malignant) related fatigue: Secondary | ICD-10-CM | POA: Insufficient documentation

## 2014-11-21 DIAGNOSIS — J45909 Unspecified asthma, uncomplicated: Secondary | ICD-10-CM | POA: Insufficient documentation

## 2014-11-21 DIAGNOSIS — Z8719 Personal history of other diseases of the digestive system: Secondary | ICD-10-CM | POA: Insufficient documentation

## 2014-11-21 LAB — CBC WITH DIFFERENTIAL/PLATELET
BASOS ABS: 0 10*3/uL (ref 0–0.1)
Basophils Relative: 0 %
Eosinophils Absolute: 0.1 10*3/uL (ref 0–0.7)
HCT: 35.4 % (ref 35.0–47.0)
HEMOGLOBIN: 11.5 g/dL — AB (ref 12.0–16.0)
Lymphocytes Relative: 7 %
Lymphs Abs: 0.4 10*3/uL — ABNORMAL LOW (ref 1.0–3.6)
MCH: 29.1 pg (ref 26.0–34.0)
MCHC: 32.5 g/dL (ref 32.0–36.0)
MCV: 89.6 fL (ref 80.0–100.0)
MONO ABS: 0.4 10*3/uL (ref 0.2–0.9)
Neutro Abs: 5.3 10*3/uL (ref 1.4–6.5)
Neutrophils Relative %: 84 %
PLATELETS: 60 10*3/uL — AB (ref 150–440)
RBC: 3.95 MIL/uL (ref 3.80–5.20)
RDW: 17.8 % — ABNORMAL HIGH (ref 11.5–14.5)
WBC: 6.2 10*3/uL (ref 3.6–11.0)

## 2014-11-21 LAB — HEPATIC FUNCTION PANEL
ALT: 39 U/L (ref 14–54)
AST: 50 U/L — ABNORMAL HIGH (ref 15–41)
Albumin: 3.5 g/dL (ref 3.5–5.0)
Alkaline Phosphatase: 138 U/L — ABNORMAL HIGH (ref 38–126)
BILIRUBIN TOTAL: 0.8 mg/dL (ref 0.3–1.2)
Bilirubin, Direct: 0.2 mg/dL (ref 0.1–0.5)
Indirect Bilirubin: 0.6 mg/dL (ref 0.3–0.9)
Total Protein: 6.8 g/dL (ref 6.5–8.1)

## 2014-11-21 LAB — CREATININE, SERUM: Creatinine, Ser: 0.43 mg/dL — ABNORMAL LOW (ref 0.44–1.00)

## 2014-11-21 MED ORDER — GABAPENTIN 100 MG PO CAPS: 200.0000 mg | ORAL_CAPSULE | Freq: Three times a day (TID) | ORAL | Status: DC

## 2014-11-21 MED ORDER — SODIUM CHLORIDE 0.9 % IJ SOLN
10.0000 mL | INTRAMUSCULAR | Status: AC | PRN
  Filled 2014-11-21: qty 10

## 2014-11-21 MED ORDER — HEPARIN SOD (PORK) LOCK FLUSH 100 UNIT/ML IV SOLN
500.0000 [IU] | Freq: Once | INTRAVENOUS | Status: AC
  Administered 2014-11-21: 500 [IU] via INTRAVENOUS

## 2014-11-21 MED ORDER — HEPARIN SOD (PORK) LOCK FLUSH 100 UNIT/ML IV SOLN
INTRAVENOUS | Status: AC
  Filled 2014-11-21: qty 5

## 2014-11-21 NOTE — Progress Notes (Signed)
[place]  Telephone:(336) [phone] Fax:(336) [phone]     ID: Tammie Robinson OB: 12/03/1965  MR#: [id number]  AXK#:[id number]  Patient Care Team: Jackolyn Confer, MD as PCP - General (Internal Medicine)  CHIEF COMPLAINT:  Rectal cancer, clinical TXN2M0, at least stage III -  s/p neoadjuvant radiation and concurrent 5FU chemotherapy completed end of July/early Aug 2015.  (initially diagnosed on 12/22/13 - upper GI showed an ulcer and colonoscopy showed a large rectal mass at the most proximal extent 4 cm from the anus). Then had Abdominoperineal resection, radical hysterectomy, vaginectomy with removal of tubes/ovaries with rectus flap myocutaneous vaginal reconstruction at [facility] on 04/12/14. Surgical pathology reports residual invasive rectal adenocarcinoma low-grade, tumor invades muscularis propria, margins uninvolved, no lymphovascular or perineural invasion, 27 lymph nodes examined negative for metastasis (ypT2pN0).  Patient started adjuvant chemotherapy with FOLFOX regimen on 05/30/14.  INTERVAL HISTORY: Patient returns for continued oncology follow-up and planned cycle 12 chemotherapy. Overall states that she is doing steady, she has occasional numbness in the feet which is unchanged. Denies any worsening since last chemotherapy. Denies any difficulty in using hands, difficulty walking, balance. No falls or dizziness. Appetite steady. No fevers or chills. No nausea vomiting or diarrhea. No new pain issues, 0/10. No new mood disturbances.  REVIEW OF SYSTEMS:   ROS As per HPI. Otherwise, a complete review of systems is negatve.  PAST MEDICAL HISTORY: Past Medical History  Diagnosis Date  . Blood in stool   . Hypertension   . Migraine   . Asthma     seasonal,never hospitalized  . Rectal cancer   . Migraines   . History of ERCP     PAST SURGICAL HISTORY: Past Surgical History  Procedure Laterality Date  . Cholecystectomy  2012    Dr. Jamal Collin   . Vaginal delivery      2, preterm labor    FAMILY HISTORY Family History  Problem Relation Age of Onset  . Arthritis Mother   . Stroke Mother   . Hypertension Mother   . Diabetes Mother   . Arthritis Father   . Heart disease Father 69    s/p CABG  . Stroke Father   . Hypertension Father   . Alcohol abuse Maternal Grandmother   . Diabetes Maternal Grandmother   . Alcohol abuse Maternal Grandfather   . Diabetes Maternal Grandfather   . Cancer Paternal Grandmother     breast  . Heart disease Paternal Grandfather        ADVANCED DIRECTIVES:    HEALTH MAINTENANCE: History  Substance Use Topics  . Smoking status: Never Smoker   . Smokeless tobacco: Not on file  . Alcohol Use: No     Colonoscopy:  PAP:  Bone density:  Lipid panel:  Allergies  Allergen Reactions  . Lisinopril Hives and Rash    Current Outpatient Prescriptions  Medication Sig Dispense Refill  . albuterol (PROVENTIL HFA;VENTOLIN HFA) 108 (90 BASE) MCG/ACT inhaler Inhale into the lungs.    . ALPRAZolam (XANAX) 0.25 MG tablet take 1 tablet by mouth three times a day if needed for anxiety 90 tablet 1  . amLODipine (NORVASC) 5 MG tablet Take 1 tablet (5 mg total) by mouth daily. 90 tablet 3  . ferrous sulfate 325 (65 FE) MG tablet Take by mouth.    Marland Kitchen FLUoxetine (PROZAC) 20 MG tablet Take 1 tablet (20 mg total) by mouth daily. 30 tablet 3  . gabapentin (NEURONTIN) 100 MG capsule Take 2  capsules (200 mg total) by mouth 3 (three) times daily. 180 capsule 3  . HYDROmorphone (DILAUDID) 2 MG tablet Take by mouth.    . lidocaine-prilocaine (EMLA) cream   0  . prochlorperazine (COMPAZINE) 10 MG tablet   0  . acetaminophen (TYLENOL) 325 MG tablet Take by mouth.    . zolpidem (AMBIEN) 10 MG tablet Take 1 tablet (10 mg total) by mouth at bedtime as needed for sleep. (Patient not taking: Reported on 11/21/2014) 30 tablet 0   No current facility-administered medications for this visit.   Facility-Administered  Medications Ordered in Other Visits  Medication Dose Route Frequency Provider Last Rate Last Dose  . sodium chloride 0.9 % injection 10 mL  10 mL Intravenous PRN Leia Alf, MD        OBJECTIVE: Filed Vitals:   11/21/14 0940  BP: 133/86  Pulse: 65  Temp: 98.5 F (36.9 C)  Resp: 18     Body mass index is 26.95 kg/(m^2).    ECOG FS:1 - Symptomatic but completely ambulatory  General: Well-developed, well-nourished, no acute distress. Eyes: Pink conjunctiva, anicteric sclera. HEENT: Normocephalic, moist mucous membranes, clear oropharnyx. Lungs: Clear to auscultation bilaterally. Heart: Regular rate and rhythm. No rubs, murmurs, or gallops. Abdomen: Soft, nontender, nondistended. No organomegaly noted, normoactive bowel sounds. Musculoskeletal: No edema, cyanosis, or clubbing. Neuro: Alert, answering all questions appropriately. Cranial nerves grossly intact. Skin: No rashes or petechiae noted. Psych: Normal affect. Lymphatics: No cervical, calvicular, axillary or inguinal LAD.   LAB RESULTS:     Component Value Date/Time   NA 144 08/29/2014 0908   NA 131* 12/22/2013 0937   K 3.3* 08/29/2014 0908   K 3.2* 12/22/2013 0937   CL 107 08/29/2014 0908   CL 98 12/22/2013 0937   CO2 29 08/29/2014 0908   CO2 25 12/22/2013 0937   GLUCOSE 88 08/29/2014 0908   GLUCOSE 92 12/22/2013 0937   BUN 18 08/29/2014 0908   BUN 7 12/22/2013 0937   CREATININE 0.43* 11/21/2014 0917   CREATININE 0.52 11/07/2014 0902   CALCIUM 8.4* 08/29/2014 0908   CALCIUM 7.1* 12/22/2013 0937   PROT 6.8 11/21/2014 0917   PROT 6.8 11/07/2014 0902   ALBUMIN 3.5 11/21/2014 0917   ALBUMIN 3.5 11/07/2014 0902   AST 50* 11/21/2014 0917   AST 41 11/07/2014 0902   ALT 39 11/21/2014 0917   ALT 31 11/07/2014 0902   ALKPHOS 138* 11/21/2014 0917   ALKPHOS 137* 11/07/2014 0902   BILITOT 0.8 11/21/2014 0917   GFRNONAA >60 11/21/2014 0917   GFRNONAA >60 11/07/2014 0902   GFRAA >60 11/21/2014 0917   GFRAA >60  11/07/2014 0902    No results found for: SPEP, UPEP  Lab Results  Component Value Date   WBC 6.2 11/21/2014   NEUTROABS 5.3 11/21/2014   HGB 11.5* 11/21/2014   HCT 35.4 11/21/2014   MCV 89.6 11/21/2014   PLT 60* 11/21/2014      Chemistry      Component Value Date/Time   NA 144 08/29/2014 0908   NA 131* 12/22/2013 0937   K 3.3* 08/29/2014 0908   K 3.2* 12/22/2013 0937   CL 107 08/29/2014 0908   CL 98 12/22/2013 0937   CO2 29 08/29/2014 0908   CO2 25 12/22/2013 0937   BUN 18 08/29/2014 0908   BUN 7 12/22/2013 0937   CREATININE 0.43* 11/21/2014 0917   CREATININE 0.52 11/07/2014 0902      Component Value Date/Time   CALCIUM 8.4* 08/29/2014 0908  CALCIUM 7.1* 12/22/2013 0937   ALKPHOS 138* 11/21/2014 0917   ALKPHOS 137* 11/07/2014 0902   AST 50* 11/21/2014 0917   AST 41 11/07/2014 0902   ALT 39 11/21/2014 0917   ALT 31 11/07/2014 0902   BILITOT 0.8 11/21/2014 0917          Component Value Date/Time   BILIRUBINUR neg 12/22/2013 0950   PROTEINUR 100 12/22/2013 0950   UROBILINOGEN 0.2 12/22/2013 0950   NITRITE pos 12/22/2013 0950   LEUKOCYTESUR large (3+) 12/22/2013 0950     ASSESSMENT/PLAN:   1. Rectal cancer, clinical TXN2M0, at least stage III -  s/p neoadjuvant radiation and concurrent 5FU chemotherapy completed end of July/early Aug 2015. (initially diagnosed on 12/22/13 when upper GI showed an ulcer and colonoscopy showed a large rectal mass at the most proximal extent 4 cm from the anus). Then had Abdominoperineal resection, radical hysterectomy, vaginectomy with removal of tubes/ovaries with rectus flap myocutaneous vaginal reconstruction at [facility] on 04/12/14. Surgical pathology reports residual invasive rectal adenocarcinoma low-grade, tumor invades muscularis propria, margins uninvolved, no lymphovascular or perineural invasion, 27 lymph nodes examined negative for metastasis (ypT2pN0). Started adjuvant chemotherapy with FOLFOX on 05/30/14 -   have reviewed labs from today and discussed with patient. Platelet count today is lower at 60,000, we will therefore hold chemotherapy and will repeat CBC and differential in one week and if platelet count is adequate, we'll proceed with the last planned 12th cycle of chemotherapy with cycle 12 FOLFOX (same dose oxaliplatin 68 mg/m2 IV, bolus 5-FU 400 mg/m2, leucovorin 400 mg/m2 IV, infusion of 5-FU 2400 mg/m2 IV over 46 hours). Will get labs at 1 week and at 2 weeks. We will repeat CEA level at 6 weeks. Will see her back at 12 weeks with CBC, met-B, LFT for continued surveillance.  2. Pain - under control, continue Dilaudid as-needed, she has had these pains since surgery.   3. Iron-deficiency anemia - hemoglobin is steady, continue oral iron and monitor. Also likely anemic from chemotherapy effect. 4. Thrombocytopenia - secondary to chemotherapyNo obvious bleeding issues. Continue to monitor. In between visits, patient advised to call or come to ER in case of any new symptoms or acute sickness. Patient is agreeable to this plan.   Leia Alf, MD   11/21/2014 10:38 AM

## 2014-11-26 ENCOUNTER — Other Ambulatory Visit: Payer: Self-pay

## 2014-11-26 DIAGNOSIS — C20 Malignant neoplasm of rectum: Secondary | ICD-10-CM

## 2014-11-28 ENCOUNTER — Inpatient Hospital Stay: Payer: Self-pay

## 2014-11-28 DIAGNOSIS — C20 Malignant neoplasm of rectum: Secondary | ICD-10-CM

## 2014-11-28 LAB — HEPATIC FUNCTION PANEL
ALK PHOS: 166 U/L — AB (ref 38–126)
ALT: 50 U/L (ref 14–54)
AST: 67 U/L — ABNORMAL HIGH (ref 15–41)
Albumin: 3.5 g/dL (ref 3.5–5.0)
BILIRUBIN INDIRECT: 0.6 mg/dL (ref 0.3–0.9)
BILIRUBIN TOTAL: 0.8 mg/dL (ref 0.3–1.2)
Bilirubin, Direct: 0.2 mg/dL (ref 0.1–0.5)
Total Protein: 7.1 g/dL (ref 6.5–8.1)

## 2014-11-28 LAB — CBC WITH DIFFERENTIAL/PLATELET
Basophils Absolute: 0 10*3/uL (ref 0–0.1)
Basophils Relative: 0 %
Eosinophils Absolute: 0.1 10*3/uL (ref 0–0.7)
Eosinophils Relative: 2 %
HCT: 36.2 % (ref 35.0–47.0)
Hemoglobin: 11.8 g/dL — ABNORMAL LOW (ref 12.0–16.0)
LYMPHS PCT: 4 %
Lymphs Abs: 0.3 10*3/uL — ABNORMAL LOW (ref 1.0–3.6)
MCH: 29.1 pg (ref 26.0–34.0)
MCHC: 32.6 g/dL (ref 32.0–36.0)
MCV: 89.3 fL (ref 80.0–100.0)
MONO ABS: 0.6 10*3/uL (ref 0.2–0.9)
Monocytes Relative: 10 %
NEUTROS ABS: 5.4 10*3/uL (ref 1.4–6.5)
Neutrophils Relative %: 84 %
PLATELETS: 110 10*3/uL — AB (ref 150–440)
RBC: 4.05 MIL/uL (ref 3.80–5.20)
RDW: 17.7 % — ABNORMAL HIGH (ref 11.5–14.5)
WBC: 6.3 10*3/uL (ref 3.6–11.0)

## 2014-11-28 LAB — BASIC METABOLIC PANEL
Anion gap: 6 (ref 5–15)
BUN: 12 mg/dL (ref 6–20)
CALCIUM: 8.5 mg/dL — AB (ref 8.9–10.3)
CO2: 26 mmol/L (ref 22–32)
CREATININE: 0.5 mg/dL (ref 0.44–1.00)
Chloride: 105 mmol/L (ref 101–111)
GFR calc Af Amer: >60 mL/min (ref >60)
GLUCOSE: 96 mg/dL (ref 65–99)
Potassium: 3.8 mmol/L (ref 3.5–5.1)
Sodium: 137 mmol/L (ref 135–145)

## 2014-11-28 MED ORDER — SODIUM CHLORIDE 0.9 % IV SOLN: Freq: Once | INTRAVENOUS | Status: DC

## 2014-11-28 MED ORDER — PALONOSETRON HCL INJECTION 0.25 MG/5ML
0.2500 mg | Freq: Once | INTRAVENOUS | Status: AC
  Administered 2014-11-28: 0.25 mg via INTRAVENOUS
  Filled 2014-11-28: qty 5

## 2014-11-28 MED ORDER — HEPARIN SOD (PORK) LOCK FLUSH 100 UNIT/ML IV SOLN
500.0000 [IU] | Freq: Once | INTRAVENOUS | Status: AC
  Filled 2014-11-28: qty 5

## 2014-11-28 MED ORDER — DEXTROSE 5 % IV SOLN
68.0000 mg/m2 | Freq: Once | INTRAVENOUS | Status: AC
  Administered 2014-11-28: 125 mg via INTRAVENOUS
  Filled 2014-11-28: qty 25

## 2014-11-28 MED ORDER — LEUCOVORIN CALCIUM INJECTION 350 MG
700.0000 mg | Freq: Once | INTRAVENOUS | Status: AC
  Administered 2014-11-28: 700 mg via INTRAVENOUS
  Filled 2014-11-28: qty 25

## 2014-11-28 MED ORDER — FLUOROURACIL CHEMO INJECTION 2.5 GM/50ML
400.0000 mg/m2 | Freq: Once | INTRAVENOUS | Status: AC
  Administered 2014-11-28: 750 mg via INTRAVENOUS
  Filled 2014-11-28: qty 15

## 2014-11-28 MED ORDER — SODIUM CHLORIDE 0.9 % IV SOLN
Freq: Once | INTRAVENOUS | Status: AC
  Administered 2014-11-28: 13:00:00 via INTRAVENOUS
  Filled 2014-11-28: qty 5

## 2014-11-28 MED ORDER — SODIUM CHLORIDE 0.9 % IV SOLN
2400.0000 mg/m2 | INTRAVENOUS | Status: AC
  Administered 2014-11-28: 4350 mg via INTRAVENOUS
  Filled 2014-11-28: qty 87

## 2014-11-28 MED ORDER — DEXTROSE 5 % IV SOLN
Freq: Once | INTRAVENOUS | Status: AC
  Administered 2014-11-28: 13:00:00 via INTRAVENOUS
  Filled 2014-11-28: qty 250

## 2014-11-28 MED ORDER — SODIUM CHLORIDE 0.9 % IJ SOLN
10.0000 mL | INTRAMUSCULAR | Status: AC | PRN
  Administered 2014-11-28: 10 mL via INTRAVENOUS
  Filled 2014-11-28: qty 10

## 2014-11-30 ENCOUNTER — Inpatient Hospital Stay: Payer: Self-pay

## 2014-11-30 VITALS — BP 120/79 | HR 67 | Temp 96.4°F | Resp 18

## 2014-11-30 DIAGNOSIS — C20 Malignant neoplasm of rectum: Secondary | ICD-10-CM

## 2014-11-30 MED ORDER — SODIUM CHLORIDE 0.9 % IJ SOLN
10.0000 mL | INTRAMUSCULAR | Status: DC | PRN
  Administered 2014-11-30: 10 mL
  Filled 2014-11-30: qty 10

## 2014-11-30 MED ORDER — HEPARIN SOD (PORK) LOCK FLUSH 100 UNIT/ML IV SOLN
500.0000 [IU] | Freq: Once | INTRAVENOUS | Status: AC | PRN
  Administered 2014-11-30: 500 [IU]

## 2014-12-05 ENCOUNTER — Inpatient Hospital Stay: Payer: Self-pay

## 2014-12-05 DIAGNOSIS — C20 Malignant neoplasm of rectum: Secondary | ICD-10-CM

## 2014-12-05 LAB — CBC WITH DIFFERENTIAL/PLATELET
BASOS ABS: 0 10*3/uL (ref 0–0.1)
Basophils Relative: 1 %
Eosinophils Absolute: 0.2 10*3/uL (ref 0–0.7)
Eosinophils Relative: 4 %
HCT: 35.5 % (ref 35.0–47.0)
HEMOGLOBIN: 11.5 g/dL — AB (ref 12.0–16.0)
LYMPHS PCT: 7 %
Lymphs Abs: 0.3 10*3/uL — ABNORMAL LOW (ref 1.0–3.6)
MCH: 28.6 pg (ref 26.0–34.0)
MCHC: 32.3 g/dL (ref 32.0–36.0)
MCV: 88.5 fL (ref 80.0–100.0)
MONOS PCT: 7 %
Monocytes Absolute: 0.3 10*3/uL (ref 0.2–0.9)
Neutro Abs: 3.2 10*3/uL (ref 1.4–6.5)
Neutrophils Relative %: 81 %
PLATELETS: 104 10*3/uL — AB (ref 150–440)
RBC: 4.01 MIL/uL (ref 3.80–5.20)
RDW: 17 % — ABNORMAL HIGH (ref 11.5–14.5)
WBC: 3.9 10*3/uL (ref 3.6–11.0)

## 2014-12-05 LAB — BASIC METABOLIC PANEL
ANION GAP: 5 (ref 5–15)
BUN: 15 mg/dL (ref 6–20)
CALCIUM: 8.6 mg/dL — AB (ref 8.9–10.3)
CHLORIDE: 105 mmol/L (ref 101–111)
CO2: 28 mmol/L (ref 22–32)
Creatinine, Ser: 0.67 mg/dL (ref 0.44–1.00)
GFR calc Af Amer: >60 mL/min (ref >60)
Glucose, Bld: 105 mg/dL — ABNORMAL HIGH (ref 65–99)
Potassium: 4.3 mmol/L (ref 3.5–5.1)
SODIUM: 138 mmol/L (ref 135–145)

## 2014-12-05 LAB — HEPATIC FUNCTION PANEL
ALK PHOS: 160 U/L — AB (ref 38–126)
ALT: 37 U/L (ref 14–54)
AST: 47 U/L — ABNORMAL HIGH (ref 15–41)
Albumin: 3.7 g/dL (ref 3.5–5.0)
BILIRUBIN INDIRECT: 0.4 mg/dL (ref 0.3–0.9)
Bilirubin, Direct: 0.1 mg/dL (ref 0.1–0.5)
TOTAL PROTEIN: 7 g/dL (ref 6.5–8.1)
Total Bilirubin: 0.5 mg/dL (ref 0.3–1.2)

## 2014-12-10 ENCOUNTER — Other Ambulatory Visit: Payer: Self-pay | Admitting: Internal Medicine

## 2015-01-02 ENCOUNTER — Inpatient Hospital Stay: Payer: Self-pay | Attending: Internal Medicine

## 2015-01-02 DIAGNOSIS — C20 Malignant neoplasm of rectum: Secondary | ICD-10-CM | POA: Insufficient documentation

## 2015-01-02 DIAGNOSIS — C801 Malignant (primary) neoplasm, unspecified: Secondary | ICD-10-CM

## 2015-01-02 DIAGNOSIS — Z452 Encounter for adjustment and management of vascular access device: Secondary | ICD-10-CM | POA: Insufficient documentation

## 2015-01-02 MED ORDER — HEPARIN SOD (PORK) LOCK FLUSH 100 UNIT/ML IV SOLN
500.0000 [IU] | Freq: Once | INTRAVENOUS | Status: AC
  Administered 2015-01-02: 500 [IU] via INTRAVENOUS

## 2015-01-02 MED ORDER — SODIUM CHLORIDE 0.9 % IJ SOLN
10.0000 mL | Freq: Once | INTRAMUSCULAR | Status: AC
  Administered 2015-01-02: 10 mL
  Filled 2015-01-02: qty 10

## 2015-01-02 MED ORDER — HEPARIN SOD (PORK) LOCK FLUSH 100 UNIT/ML IV SOLN
INTRAVENOUS | Status: AC
  Filled 2015-01-02: qty 5

## 2015-01-09 ENCOUNTER — Other Ambulatory Visit: Payer: Self-pay | Admitting: *Deleted

## 2015-01-09 MED ORDER — HYDROMORPHONE HCL 2 MG PO TABS: 2.0000 mg | ORAL_TABLET | ORAL | Status: DC | PRN

## 2015-01-09 NOTE — Telephone Encounter (Signed)
Informed that prescription is ready to pick up Message left on vm

## 2015-02-13 ENCOUNTER — Other Ambulatory Visit: Payer: Self-pay

## 2015-02-13 ENCOUNTER — Ambulatory Visit: Payer: Self-pay | Admitting: Internal Medicine

## 2015-02-21 ENCOUNTER — Encounter: Payer: Self-pay | Admitting: Internal Medicine

## 2015-02-21 ENCOUNTER — Other Ambulatory Visit: Payer: Self-pay | Admitting: Internal Medicine

## 2015-02-21 MED ORDER — AMLODIPINE BESYLATE 5 MG PO TABS: 5.0000 mg | ORAL_TABLET | Freq: Every day | ORAL | Status: DC

## 2015-02-28 ENCOUNTER — Inpatient Hospital Stay: Payer: Self-pay

## 2015-02-28 ENCOUNTER — Telehealth: Payer: Self-pay | Admitting: *Deleted

## 2015-02-28 ENCOUNTER — Inpatient Hospital Stay: Payer: Self-pay | Attending: Internal Medicine | Admitting: Internal Medicine

## 2015-02-28 VITALS — BP 116/93 | HR 51 | Temp 97.1°F | Resp 18 | Ht 64.9 in | Wt 175.0 lb

## 2015-02-28 DIAGNOSIS — D6959 Other secondary thrombocytopenia: Secondary | ICD-10-CM | POA: Insufficient documentation

## 2015-02-28 DIAGNOSIS — Z9221 Personal history of antineoplastic chemotherapy: Secondary | ICD-10-CM | POA: Insufficient documentation

## 2015-02-28 DIAGNOSIS — J45909 Unspecified asthma, uncomplicated: Secondary | ICD-10-CM | POA: Insufficient documentation

## 2015-02-28 DIAGNOSIS — C20 Malignant neoplasm of rectum: Secondary | ICD-10-CM | POA: Insufficient documentation

## 2015-02-28 DIAGNOSIS — I10 Essential (primary) hypertension: Secondary | ICD-10-CM | POA: Insufficient documentation

## 2015-02-28 DIAGNOSIS — Z923 Personal history of irradiation: Secondary | ICD-10-CM | POA: Insufficient documentation

## 2015-02-28 DIAGNOSIS — D509 Iron deficiency anemia, unspecified: Secondary | ICD-10-CM | POA: Insufficient documentation

## 2015-02-28 DIAGNOSIS — Z79899 Other long term (current) drug therapy: Secondary | ICD-10-CM | POA: Insufficient documentation

## 2015-02-28 LAB — CBC WITH DIFFERENTIAL/PLATELET
Basophils Absolute: 0 10*3/uL (ref 0–0.1)
Basophils Relative: 1 %
EOS ABS: 0.2 10*3/uL (ref 0–0.7)
EOS PCT: 7 %
HEMATOCRIT: 37.9 % (ref 35.0–47.0)
Hemoglobin: 12.9 g/dL (ref 12.0–16.0)
LYMPHS ABS: 0.4 10*3/uL — AB (ref 1.0–3.6)
Lymphocytes Relative: 14 %
MCH: 29 pg (ref 26.0–34.0)
MCHC: 34 g/dL (ref 32.0–36.0)
MCV: 85.1 fL (ref 80.0–100.0)
MONO ABS: 0.3 10*3/uL (ref 0.2–0.9)
Monocytes Relative: 9 %
NEUTROS ABS: 2.2 10*3/uL (ref 1.4–6.5)
NEUTROS PCT: 69 %
PLATELETS: 111 10*3/uL — AB (ref 150–440)
RBC: 4.45 MIL/uL (ref 3.80–5.20)
RDW: 13.2 % (ref 11.5–14.5)
WBC: 3.1 10*3/uL — ABNORMAL LOW (ref 3.6–11.0)

## 2015-02-28 LAB — HEPATIC FUNCTION PANEL
ALT: 26 U/L (ref 14–54)
AST: 34 U/L (ref 15–41)
Albumin: 3.9 g/dL (ref 3.5–5.0)
Alkaline Phosphatase: 174 U/L — ABNORMAL HIGH (ref 38–126)
Total Bilirubin: 0.6 mg/dL (ref 0.3–1.2)
Total Protein: 7.2 g/dL (ref 6.5–8.1)

## 2015-02-28 LAB — BASIC METABOLIC PANEL
ANION GAP: 5 (ref 5–15)
BUN: 12 mg/dL (ref 6–20)
CALCIUM: 8.6 mg/dL — AB (ref 8.9–10.3)
CO2: 27 mmol/L (ref 22–32)
CREATININE: 0.57 mg/dL (ref 0.44–1.00)
Chloride: 106 mmol/L (ref 101–111)
Glucose, Bld: 92 mg/dL (ref 65–99)
Potassium: 3.4 mmol/L — ABNORMAL LOW (ref 3.5–5.1)
Sodium: 138 mmol/L (ref 135–145)

## 2015-02-28 MED ORDER — HEPARIN SOD (PORK) LOCK FLUSH 100 UNIT/ML IV SOLN
INTRAVENOUS | Status: AC
  Filled 2015-02-28: qty 5

## 2015-02-28 MED ORDER — POTASSIUM CHLORIDE CRYS ER 10 MEQ PO TBCR: 10.0000 meq | EXTENDED_RELEASE_TABLET | Freq: Every day | ORAL | Status: DC

## 2015-02-28 MED ORDER — HYDROMORPHONE HCL 2 MG PO TABS: 2.0000 mg | ORAL_TABLET | ORAL | Status: DC | PRN

## 2015-02-28 MED ORDER — HEPARIN SOD (PORK) LOCK FLUSH 100 UNIT/ML IV SOLN
500.0000 [IU] | Freq: Once | INTRAVENOUS | Status: AC
  Administered 2015-02-28: 500 [IU] via INTRAVENOUS

## 2015-02-28 MED ORDER — SODIUM CHLORIDE 0.9 % IJ SOLN
10.0000 mL | Freq: Once | INTRAMUSCULAR | Status: AC
  Administered 2015-02-28: 10 mL via INTRAVENOUS
  Filled 2015-02-28: qty 10

## 2015-02-28 NOTE — Progress Notes (Signed)
Patient states that overall she has feeling pretty good. She states that she still has some mild pain from her surgery and rates her pain a 2/10. She also states that she has neuropathy in her feet and it really bothers her at night.

## 2015-02-28 NOTE — Telephone Encounter (Signed)
requested xanax refill for pt and pandit approved and I called in refill to her pharmacy and she is aware that it was called in. She also needed refill dilaudid and written rx was given.

## 2015-03-01 LAB — CEA: CEA: 2.2 ng/mL (ref 0.0–4.7)

## 2015-03-06 ENCOUNTER — Ambulatory Visit
Admission: RE | Admit: 2015-03-06 | Discharge: 2015-03-06 | Disposition: A | Discharge status: Home / Self Care | Payer: Self-pay | Source: Ambulatory Visit | Attending: Internal Medicine | Admitting: Internal Medicine

## 2015-03-06 DIAGNOSIS — C20 Malignant neoplasm of rectum: Secondary | ICD-10-CM | POA: Insufficient documentation

## 2015-03-06 DIAGNOSIS — R918 Other nonspecific abnormal finding of lung field: Secondary | ICD-10-CM | POA: Insufficient documentation

## 2015-03-06 MED ORDER — IOHEXOL 300 MG/ML  SOLN: 100.0000 mL | Freq: Once | INTRAMUSCULAR | Status: DC | PRN

## 2015-03-06 MED ORDER — IOHEXOL 350 MG/ML SOLN
100.0000 mL | Freq: Once | INTRAVENOUS | Status: DC | PRN
  Administered 2015-03-06: 100 mL via INTRAVENOUS

## 2015-03-12 NOTE — Progress Notes (Signed)
[place]  Telephone:(336) [phone] Fax:(336) [phone]     ID: MIRNA SUTCLIFFE OB: 09-02-1965  MR#: [id number]  UYQ#:[id number]  Patient Care Team: Jackolyn Confer, MD as PCP - General (Internal Medicine)  CHIEF COMPLAINT:  Rectal cancer, clinical TXN2M0, at least stage III -  s/p neoadjuvant radiation and concurrent 5FU chemotherapy completed end of July/early Aug 2015.  (initially diagnosed on 12/22/13 - upper GI showed an ulcer and colonoscopy showed a large rectal mass at the most proximal extent 4 cm from the anus). Then had Abdominoperineal resection, radical hysterectomy, vaginectomy with removal of tubes/ovaries with rectus flap myocutaneous vaginal reconstruction at [facility] on 04/12/14. Surgical pathology reports residual invasive rectal adenocarcinoma low-grade, tumor invades muscularis propria, margins uninvolved, no lymphovascular or perineural invasion, 27 lymph nodes examined negative for metastasis (ypT2pN0).  Patient got adjuvant chemotherapy with FOLFOX regimen x 12 doses (05/30/14 - 11/21/14).  HISTORY OF PRESENT ILLNESS: Patient returns for continued oncology follow-up, she recently completed adjuvant chemotherapy. States that she is doing steady, she has occasional numbness in the feet which is slowly improving. Denies any difficulty in using hands, difficulty walking, balance. No falls or dizziness. Appetite steady. No fevers or chills. No nausea vomiting or diarrhea. Has intermittent abdominal pain since surgery. No new mood disturbances.  REVIEW OF SYSTEMS:   ROS As in HPI above. In addition, no new headaches or focal weakness. No new mood disturbances. No sore throat or dysphagia. No hemoptysis or chest pain. No dizziness or palpitation. No new constipation, diarrhea, dysuria or hematuria. No new skin rash or bleeding symptoms. No new paresthesias in extremities. PS ECOG 0.  PAST MEDICAL HISTORY: Past Medical History  Diagnosis Date  .  Blood in stool   . Hypertension   . Migraine   . Asthma     seasonal,never hospitalized  . Rectal cancer   . Migraines   . History of ERCP     PAST SURGICAL HISTORY: Past Surgical History  Procedure Laterality Date  . Cholecystectomy  2012    Dr. Jamal Collin  . Vaginal delivery      2, preterm labor    FAMILY HISTORY Family History  Problem Relation Age of Onset  . Arthritis Mother   . Stroke Mother   . Hypertension Mother   . Diabetes Mother   . Arthritis Father   . Heart disease Father 26    s/p CABG  . Stroke Father   . Hypertension Father   . Alcohol abuse Maternal Grandmother   . Diabetes Maternal Grandmother   . Alcohol abuse Maternal Grandfather   . Diabetes Maternal Grandfather   . Cancer Paternal Grandmother     breast  . Heart disease Paternal Grandfather     Social History  Substance Use Topics  . Smoking status: Never Smoker   . Smokeless tobacco: Not on file  . Alcohol Use: No   Allergies  Allergen Reactions  . Lisinopril Hives and Rash    Current Outpatient Prescriptions  Medication Sig Dispense Refill  . acetaminophen (TYLENOL) 325 MG tablet Take by mouth.    Marland Kitchen albuterol (PROVENTIL HFA;VENTOLIN HFA) 108 (90 BASE) MCG/ACT inhaler Inhale into the lungs.    . ALPRAZolam (XANAX) 0.25 MG tablet take 1 tablet by mouth three times a day if needed for anxiety 90 tablet 1  . amLODipine (NORVASC) 5 MG tablet Take 1 tablet (5 mg total) by mouth daily. MUST BE SEEN FOR ADDITIONAL REFILLS. CALL OFFICE SOON. 90 tablet 0  .  ferrous sulfate 325 (65 FE) MG tablet Take by mouth.    . gabapentin (NEURONTIN) 100 MG capsule Take 2 capsules (200 mg total) by mouth 3 (three) times daily. 180 capsule 3  . HYDROmorphone (DILAUDID) 2 MG tablet Take 1 tablet (2 mg total) by mouth every 4 (four) hours as needed for severe pain. 60 tablet 0  . lidocaine-prilocaine (EMLA) cream   0  . prochlorperazine (COMPAZINE) 10 MG tablet   0  . FLUoxetine (PROZAC) 20 MG tablet Take 1  tablet (20 mg total) by mouth daily. (Patient not taking: Reported on 02/28/2015) 30 tablet 3  . potassium chloride SA (K-DUR,KLOR-CON) 10 MEQ tablet Take 1 tablet (10 mEq total) by mouth daily. 6 tablet 0  . zolpidem (AMBIEN) 10 MG tablet Take 1 tablet (10 mg total) by mouth at bedtime as needed for sleep. (Patient not taking: Reported on 11/21/2014) 30 tablet 0   No current facility-administered medications for this visit.   Facility-Administered Medications Ordered in Other Visits  Medication Dose Route Frequency Provider Last Rate Last Dose  . heparin lock flush 100 unit/mL  500 Units Intravenous Once Leia Alf, MD      . sodium chloride 0.9 % injection 10 mL  10 mL Intravenous PRN Leia Alf, MD      . sodium chloride 0.9 % injection 10 mL  10 mL Intravenous PRN Leia Alf, MD   10 mL at 11/28/14 1120    OBJECTIVE: Filed Vitals:   02/28/15 1006  BP: 116/93  Pulse: 51  Temp: 97.1 F (36.2 C)  Resp: 18     Body mass index is 29.24 kg/(m^2).     General: Alert and oriented, in no acute distress. No icterus. HEENT: EOMs intact. No cervical adenopathy. CVS: S1S2, regular. LUNGS: b/l good air entry, no rhonchi ABDOMEN: soft, no hepatomegaly or masses clinically NEURO: grossly nonfocal, cranial nerves intact. Gait unremarkable. EXTREMITIES: no pedal edema.   LAB RESULTS:    Component Value Date/Time   NA 138 02/28/2015 0950   NA 144 08/29/2014 0908   K 3.4* 02/28/2015 0950   K 3.3* 08/29/2014 0908   CL 106 02/28/2015 0950   CL 107 08/29/2014 0908   CO2 27 02/28/2015 0950   CO2 29 08/29/2014 0908   GLUCOSE 92 02/28/2015 0950   GLUCOSE 88 08/29/2014 0908   BUN 12 02/28/2015 0950   BUN 18 08/29/2014 0908   CREATININE 0.57 02/28/2015 0950   CREATININE 0.52 11/07/2014 0902   CALCIUM 8.6* 02/28/2015 0950   CALCIUM 8.4* 08/29/2014 0908   PROT 7.2 02/28/2015 0950   PROT 6.8 11/07/2014 0902   ALBUMIN 3.9 02/28/2015 0950   ALBUMIN 3.5 11/07/2014 0902   AST 34  02/28/2015 0950   AST 41 11/07/2014 0902   ALT 26 02/28/2015 0950   ALT 31 11/07/2014 0902   ALKPHOS 174* 02/28/2015 0950   ALKPHOS 137* 11/07/2014 0902   BILITOT 0.6 02/28/2015 0950   BILITOT 0.8 11/07/2014 0902   GFRNONAA >60 02/28/2015 0950   GFRNONAA >60 11/07/2014 0902   GFRNONAA >60 08/29/2014 0908   GFRAA >60 02/28/2015 0950   GFRAA >60 11/07/2014 0902   GFRAA >60 08/29/2014 0908   Lab Results  Component Value Date   WBC 3.1* 02/28/2015   NEUTROABS 2.2 02/28/2015   HGB 12.9 02/28/2015   HCT 37.9 02/28/2015   MCV 85.1 02/28/2015   PLT 111* 02/28/2015   02/28/15 - CEA normal at 2.2 (was 2.4 on 08/22/14, 16.3 in June 2015)  ASSESSMENT/PLAN:   1. Rectal cancer, clinical TXN2M0, at least stage III -  s/p neoadjuvant radiation and concurrent 5FU chemotherapy completed end of July/early Aug 2015. (initially diagnosed on 12/22/13 when upper GI showed an ulcer and colonoscopy showed a large rectal mass at the most proximal extent 4 cm from the anus). Then had Abdominoperineal resection, radical hysterectomy, vaginectomy with removal of tubes/ovaries with rectus flap myocutaneous vaginal reconstruction at [facility] on 04/12/14. Surgical pathology reports residual invasive rectal adenocarcinoma low-grade, tumor invades muscularis propria, margins uninvolved, no lymphovascular or perineural invasion, 27 lymph nodes examined negative for metastasis (ypT2pN0). Patient got adjuvant chemotherapy with FOLFOX regimen x 12 doses (05/30/14 - 11/21/14). -  have reviewed labs from today and d/w patient. Doing steady. Will schedule CT abdomen/pelvis and chext Xray for surveillance. Otherwise plan is continued surveillance and will see her back at 18 weeks with CBC, Cr, CEA, LFT for continued surveillance.  2. Pain since abdominal surgery - under control, continue Dilaudid as-needed.   3. Iron-deficiency anemia - hemoglobin is steady, continue oral iron and monitor. Also likely anemic  from chemotherapy effect. 4. Thrombocytopenia - secondary to chemotherapy. Platelet count still low but mild and no bleeding issues. Continue to monitor. 5. Port flush q6 weeks 6. In between visits, patient advised to call or come to ER in case of any new symptoms or acute sickness. Patient is agreeable to this plan.   Leia Alf, MD   03/12/2015 11:00 AM

## 2015-04-09 ENCOUNTER — Other Ambulatory Visit: Payer: Self-pay | Admitting: Internal Medicine

## 2015-04-11 ENCOUNTER — Inpatient Hospital Stay: Payer: Self-pay | Attending: Internal Medicine

## 2015-04-11 DIAGNOSIS — Z85048 Personal history of other malignant neoplasm of rectum, rectosigmoid junction, and anus: Secondary | ICD-10-CM | POA: Insufficient documentation

## 2015-04-11 DIAGNOSIS — C801 Malignant (primary) neoplasm, unspecified: Secondary | ICD-10-CM

## 2015-04-11 DIAGNOSIS — Z452 Encounter for adjustment and management of vascular access device: Secondary | ICD-10-CM | POA: Insufficient documentation

## 2015-04-11 MED ORDER — HEPARIN SOD (PORK) LOCK FLUSH 100 UNIT/ML IV SOLN
500.0000 [IU] | Freq: Once | INTRAVENOUS | Status: AC
Start: 2015-04-11 — End: 2015-04-11
  Administered 2015-04-11: 500 [IU] via INTRAVENOUS
  Filled 2015-04-11: qty 5

## 2015-04-11 MED ORDER — SODIUM CHLORIDE 0.9 % IJ SOLN
10.0000 mL | INTRAMUSCULAR | Status: DC | PRN
  Administered 2015-04-11: 10 mL via INTRAVENOUS
  Filled 2015-04-11: qty 10

## 2015-04-21 ENCOUNTER — Telehealth: Payer: Self-pay | Admitting: *Deleted

## 2015-04-21 ENCOUNTER — Other Ambulatory Visit: Payer: Self-pay | Admitting: Internal Medicine

## 2015-04-21 DIAGNOSIS — C20 Malignant neoplasm of rectum: Secondary | ICD-10-CM

## 2015-04-21 MED ORDER — HYDROMORPHONE HCL 2 MG PO TABS: 2.0000 mg | ORAL_TABLET | ORAL | Status: DC | PRN

## 2015-04-21 NOTE — Telephone Encounter (Signed)
Informed that prescription is ready to pick up  

## 2015-04-21 NOTE — Telephone Encounter (Signed)
Gabapentin was refilled on 9/18, but it is time for her Hydromorphone to be refilled

## 2015-05-01 ENCOUNTER — Ambulatory Visit (INDEPENDENT_AMBULATORY_CARE_PROVIDER_SITE_OTHER): Payer: Self-pay | Admitting: Internal Medicine

## 2015-05-01 ENCOUNTER — Encounter: Payer: Self-pay | Admitting: Internal Medicine

## 2015-05-01 VITALS — BP 143/82 | HR 64 | Temp 98.0°F | Ht 65.0 in | Wt 187.2 lb

## 2015-05-01 DIAGNOSIS — F329 Major depressive disorder, single episode, unspecified: Secondary | ICD-10-CM

## 2015-05-01 DIAGNOSIS — I10 Essential (primary) hypertension: Secondary | ICD-10-CM

## 2015-05-01 DIAGNOSIS — Z23 Encounter for immunization: Secondary | ICD-10-CM

## 2015-05-01 DIAGNOSIS — F418 Other specified anxiety disorders: Secondary | ICD-10-CM

## 2015-05-01 DIAGNOSIS — F419 Anxiety disorder, unspecified: Secondary | ICD-10-CM

## 2015-05-01 DIAGNOSIS — C20 Malignant neoplasm of rectum: Secondary | ICD-10-CM

## 2015-05-01 MED ORDER — AMLODIPINE BESYLATE 5 MG PO TABS: 5.0000 mg | ORAL_TABLET | Freq: Every day | ORAL | Status: DC

## 2015-05-01 MED ORDER — ALPRAZOLAM 0.25 MG PO TABS: ORAL_TABLET | ORAL | Status: DC

## 2015-05-01 NOTE — Progress Notes (Signed)
Subjective:    Patient ID: Tammie Robinson, female    DOB: July 28, 1965, 49 y.o.   MRN: [id number]  HPI  49YO female presents for followup. Last seen in 12/2013.  Rectal cancer - Completed treatment in 11/2014. Feels generally fatigued. Continues to have some neuropathy from treatment. Has follow up scheduled in December. Taking some time to get used to colostomy. Also struggling with insurance coverage for care. Cannot afford Obamacare premiums and makes too much for Medicaid.  HTN - Compliant with medication. No CP, HA, palpitations.  Anxiety - Symptoms well controlled with prn Alprazolam.  Wt Readings from Last 3 Encounters:  05/01/15 187 lb 4 oz (84.936 kg)  02/28/15 175 lb 0.7 oz (79.4 kg)  11/21/14 161 lb 6 oz (73.2 kg)   BP Readings from Last 3 Encounters:  05/01/15 143/82  02/28/15 116/93  11/30/14 120/79    Past Medical History  Diagnosis Date  . Blood in stool   . Hypertension   . Migraine   . Asthma     seasonal,never hospitalized  . Rectal cancer ([place])   . Migraines   . History of ERCP    Family History  Problem Relation Age of Onset  . Arthritis Mother   . Stroke Mother   . Hypertension Mother   . Diabetes Mother   . Arthritis Father   . Heart disease Father 78    s/p CABG  . Stroke Father   . Hypertension Father   . Alcohol abuse Maternal Grandmother   . Diabetes Maternal Grandmother   . Alcohol abuse Maternal Grandfather   . Diabetes Maternal Grandfather   . Cancer Paternal Grandmother     breast  . Heart disease Paternal Grandfather    Past Surgical History  Procedure Laterality Date  . Cholecystectomy  2012    Dr. Jamal Collin  . Vaginal delivery      2, preterm labor   Social History   Social History  . Marital Status: Married    Spouse Name: N/A  . Number of Children: N/A  . Years of Education: N/A   Social History Main Topics  . Smoking status: Never Smoker   . Smokeless tobacco: None  . Alcohol Use: No  . Drug Use: No  . Sexual  Activity: Not Asked   Other Topics Concern  . None   Social History Narrative   Lives in [place] with husband, has 2 children, 1 in college at [employer].      Work - Chiropractor   Diet - regular   Exercise - none recently    Review of Systems  Constitutional: Positive for fatigue. Negative for fever, chills, appetite change and unexpected weight change.  Eyes: Negative for visual disturbance.  Respiratory: Negative for shortness of breath.   Cardiovascular: Negative for chest pain and leg swelling.  Gastrointestinal: Negative for nausea, vomiting, abdominal pain, diarrhea and constipation.  Musculoskeletal: Negative for arthralgias.  Skin: Negative for color change and rash.  Neurological: Positive for weakness and numbness.  Hematological: Negative for adenopathy. Does not bruise/bleed easily.  Psychiatric/Behavioral: Negative for dysphoric mood. The patient is not nervous/anxious.        Objective:    BP 143/82 mmHg  Pulse 64  Temp(Src) 98 F (36.7 C) (Oral)  Ht 5\' 5"  (1.651 m)  Wt 187 lb 4 oz (84.936 kg)  BMI 31.16 kg/m2  SpO2 100%  LMP 12/11/2013 Physical Exam  Constitutional: She is oriented to person, place, and time. She appears well-developed  and well-nourished. No distress.  HENT:  Head: Normocephalic and atraumatic.  Right Ear: External ear normal.  Left Ear: External ear normal.  Nose: Nose normal.  Mouth/Throat: Oropharynx is clear and moist. No oropharyngeal exudate.  Eyes: Conjunctivae are normal. Pupils are equal, round, and reactive to light. Right eye exhibits no discharge. Left eye exhibits no discharge. No scleral icterus.  Neck: Normal range of motion. Neck supple. No tracheal deviation present. No thyromegaly present.  Cardiovascular: Normal rate, regular rhythm, normal heart sounds and intact distal pulses.  Exam reveals no gallop and no friction rub.   No murmur heard. Pulmonary/Chest: Effort normal and breath sounds normal. No  respiratory distress. She has no wheezes. She has no rales. She exhibits no tenderness.  Musculoskeletal: Normal range of motion. She exhibits no edema or tenderness.  Lymphadenopathy:    She has no cervical adenopathy.  Neurological: She is alert and oriented to person, place, and time. No cranial nerve deficit. She exhibits normal muscle tone. Coordination normal.  Skin: Skin is warm and dry. No rash noted. She is not diaphoretic. No erythema. No pallor.  Psychiatric: She has a normal mood and affect. Her behavior is normal. Judgment and thought content normal.          Assessment & Plan:   Problem List Items Addressed This Visit      Unprioritized   Anxiety and depression    Symptoms well controlled on current medications. Will continue.      Hypertension    BP Readings from Last 3 Encounters:  05/01/15 143/82  02/28/15 116/93  11/30/14 120/79   BP well controlled generally. Continue current medications.      Relevant Medications   amLODipine (NORVASC) 5 MG tablet   Rectal cancer ([place]) - Primary    Reviewed notes from oncology. Continue scheduled follow up in 06/2015.       Relevant Medications   ALPRAZolam (XANAX) 0.25 MG tablet       Return in about 6 months (around 10/30/2015) for Recheck.

## 2015-05-01 NOTE — Progress Notes (Signed)
Pre visit review using our clinic review tool, if applicable. No additional management support is needed unless otherwise documented below in the visit note. 

## 2015-05-01 NOTE — Assessment & Plan Note (Signed)
BP Readings from Last 3 Encounters:  05/01/15 143/82  02/28/15 116/93  11/30/14 120/79   BP well controlled generally. Continue current medications.

## 2015-05-01 NOTE — Assessment & Plan Note (Signed)
Symptoms well controlled on current medications. Will continue. 

## 2015-05-01 NOTE — Assessment & Plan Note (Signed)
Reviewed notes from oncology. Continue scheduled follow up in 06/2015.

## 2015-05-01 NOTE — Patient Instructions (Signed)
Follow up in 6 months or sooner as needed.

## 2015-05-18 ENCOUNTER — Telehealth: Payer: Self-pay | Admitting: *Deleted

## 2015-05-18 NOTE — Telephone Encounter (Signed)
Pt had sent pt advice that was on Dr. Ma Hillock basket and when I checked it she had some questions when she goes on a cruise 11/12.  She wanted to know about whether she needs atb if she has any poss. Infection while on ship. Esp the port if it gets infected and then pain rx before she goes so she will have enogh on trip.  I spoke to Dr. B and then spoke to pt and she only gets port flushed so there should not be an inc. Risk of infection with port.  She is not on chemo and has not been in a while.  She is coming to cancer center 11/1 and will get flushed and get rx for pain med at that time.

## 2015-05-23 ENCOUNTER — Telehealth: Payer: Self-pay | Admitting: *Deleted

## 2015-05-23 ENCOUNTER — Inpatient Hospital Stay: Payer: Self-pay | Attending: Family Medicine

## 2015-05-23 ENCOUNTER — Other Ambulatory Visit: Payer: Self-pay | Admitting: *Deleted

## 2015-05-23 DIAGNOSIS — C801 Malignant (primary) neoplasm, unspecified: Secondary | ICD-10-CM

## 2015-05-23 DIAGNOSIS — C20 Malignant neoplasm of rectum: Secondary | ICD-10-CM | POA: Insufficient documentation

## 2015-05-23 DIAGNOSIS — Z452 Encounter for adjustment and management of vascular access device: Secondary | ICD-10-CM | POA: Insufficient documentation

## 2015-05-23 MED ORDER — HEPARIN SOD (PORK) LOCK FLUSH 100 UNIT/ML IV SOLN
500.0000 [IU] | Freq: Once | INTRAVENOUS | Status: AC
  Administered 2015-05-23: 500 [IU] via INTRAVENOUS
  Filled 2015-05-23: qty 5

## 2015-05-23 MED ORDER — SODIUM CHLORIDE 0.9 % IJ SOLN
10.0000 mL | INTRAMUSCULAR | Status: DC | PRN
  Administered 2015-05-23: 10 mL
  Filled 2015-05-23: qty 10

## 2015-05-23 MED ORDER — HYDROMORPHONE HCL 2 MG PO TABS: 2.0000 mg | ORAL_TABLET | ORAL | Status: DC | PRN

## 2015-05-23 NOTE — Telephone Encounter (Signed)
Pt here for port flush and asking for refill of pain meds.  Spoke to [employer] and she gave 1 refill of dilaudid. Pt given rx

## 2015-05-28 ENCOUNTER — Encounter: Payer: Self-pay | Admitting: Internal Medicine

## 2015-05-29 ENCOUNTER — Encounter: Payer: Self-pay | Admitting: Internal Medicine

## 2015-05-30 ENCOUNTER — Other Ambulatory Visit: Payer: Self-pay | Admitting: *Deleted

## 2015-05-30 MED ORDER — ALBUTEROL SULFATE HFA 108 (90 BASE) MCG/ACT IN AERS: 1.0000 | INHALATION_SPRAY | RESPIRATORY_TRACT | Status: DC | PRN

## 2015-07-04 ENCOUNTER — Encounter: Payer: Self-pay | Admitting: Internal Medicine

## 2015-07-04 ENCOUNTER — Other Ambulatory Visit: Payer: Self-pay | Admitting: *Deleted

## 2015-07-04 ENCOUNTER — Inpatient Hospital Stay: Payer: Self-pay | Attending: Internal Medicine | Admitting: Internal Medicine

## 2015-07-04 ENCOUNTER — Inpatient Hospital Stay: Payer: Self-pay

## 2015-07-04 DIAGNOSIS — Z90722 Acquired absence of ovaries, bilateral: Secondary | ICD-10-CM | POA: Insufficient documentation

## 2015-07-04 DIAGNOSIS — S60421S Blister (nonthermal) of left index finger, sequela: Secondary | ICD-10-CM | POA: Insufficient documentation

## 2015-07-04 DIAGNOSIS — M2550 Pain in unspecified joint: Secondary | ICD-10-CM | POA: Insufficient documentation

## 2015-07-04 DIAGNOSIS — Z923 Personal history of irradiation: Secondary | ICD-10-CM | POA: Insufficient documentation

## 2015-07-04 DIAGNOSIS — Z85048 Personal history of other malignant neoplasm of rectum, rectosigmoid junction, and anus: Secondary | ICD-10-CM | POA: Insufficient documentation

## 2015-07-04 DIAGNOSIS — C20 Malignant neoplasm of rectum: Secondary | ICD-10-CM

## 2015-07-04 DIAGNOSIS — I10 Essential (primary) hypertension: Secondary | ICD-10-CM | POA: Insufficient documentation

## 2015-07-04 DIAGNOSIS — Z803 Family history of malignant neoplasm of breast: Secondary | ICD-10-CM | POA: Insufficient documentation

## 2015-07-04 DIAGNOSIS — Z8669 Personal history of other diseases of the nervous system and sense organs: Secondary | ICD-10-CM | POA: Insufficient documentation

## 2015-07-04 DIAGNOSIS — D509 Iron deficiency anemia, unspecified: Secondary | ICD-10-CM | POA: Insufficient documentation

## 2015-07-04 DIAGNOSIS — Z9221 Personal history of antineoplastic chemotherapy: Secondary | ICD-10-CM | POA: Insufficient documentation

## 2015-07-04 DIAGNOSIS — Z9049 Acquired absence of other specified parts of digestive tract: Secondary | ICD-10-CM | POA: Insufficient documentation

## 2015-07-04 DIAGNOSIS — Z79899 Other long term (current) drug therapy: Secondary | ICD-10-CM | POA: Insufficient documentation

## 2015-07-04 DIAGNOSIS — Z452 Encounter for adjustment and management of vascular access device: Secondary | ICD-10-CM | POA: Insufficient documentation

## 2015-07-04 DIAGNOSIS — J45909 Unspecified asthma, uncomplicated: Secondary | ICD-10-CM | POA: Insufficient documentation

## 2015-07-04 DIAGNOSIS — Z90710 Acquired absence of both cervix and uterus: Secondary | ICD-10-CM | POA: Insufficient documentation

## 2015-07-04 DIAGNOSIS — D696 Thrombocytopenia, unspecified: Secondary | ICD-10-CM | POA: Insufficient documentation

## 2015-07-04 LAB — CBC WITH DIFFERENTIAL/PLATELET
BASOS ABS: 0 10*3/uL (ref 0–0.1)
BASOS PCT: 1 %
EOS ABS: 0.2 10*3/uL (ref 0–0.7)
Eosinophils Relative: 5 %
HEMATOCRIT: 36.6 % (ref 35.0–47.0)
HEMOGLOBIN: 12.3 g/dL (ref 12.0–16.0)
Lymphocytes Relative: 15 %
Lymphs Abs: 0.5 10*3/uL — ABNORMAL LOW (ref 1.0–3.6)
MCH: 28.1 pg (ref 26.0–34.0)
MCHC: 33.7 g/dL (ref 32.0–36.0)
MCV: 83.3 fL (ref 80.0–100.0)
MONOS PCT: 10 %
Monocytes Absolute: 0.4 10*3/uL (ref 0.2–0.9)
NEUTROS ABS: 2.4 10*3/uL (ref 1.4–6.5)
NEUTROS PCT: 69 %
Platelets: 120 10*3/uL — ABNORMAL LOW (ref 150–440)
RBC: 4.4 MIL/uL (ref 3.80–5.20)
RDW: 14.8 % — ABNORMAL HIGH (ref 11.5–14.5)
WBC: 3.5 10*3/uL — AB (ref 3.6–11.0)

## 2015-07-04 LAB — COMPREHENSIVE METABOLIC PANEL
ALBUMIN: 3.7 g/dL (ref 3.5–5.0)
ALK PHOS: 177 U/L — AB (ref 38–126)
ALT: 43 U/L (ref 14–54)
ANION GAP: 6 (ref 5–15)
AST: 40 U/L (ref 15–41)
BILIRUBIN TOTAL: 0.5 mg/dL (ref 0.3–1.2)
BUN: 16 mg/dL (ref 6–20)
CALCIUM: 8.8 mg/dL — AB (ref 8.9–10.3)
CO2: 26 mmol/L (ref 22–32)
Chloride: 106 mmol/L (ref 101–111)
Creatinine, Ser: 0.77 mg/dL (ref 0.44–1.00)
GFR calc Af Amer: >60 mL/min (ref >60)
GFR calc non Af Amer: >60 mL/min (ref >60)
GLUCOSE: 130 mg/dL — AB (ref 65–99)
Potassium: 3.4 mmol/L — ABNORMAL LOW (ref 3.5–5.1)
SODIUM: 138 mmol/L (ref 135–145)
Total Protein: 7 g/dL (ref 6.5–8.1)

## 2015-07-04 MED ORDER — HEPARIN SOD (PORK) LOCK FLUSH 100 UNIT/ML IV SOLN
INTRAVENOUS | Status: AC
  Filled 2015-07-04: qty 5

## 2015-07-04 MED ORDER — SODIUM CHLORIDE 0.9 % IJ SOLN
10.0000 mL | INTRAMUSCULAR | Status: DC | PRN
  Administered 2015-07-04: 10 mL
  Filled 2015-07-04: qty 10

## 2015-07-04 MED ORDER — HEPARIN SOD (PORK) LOCK FLUSH 100 UNIT/ML IV SOLN
500.0000 [IU] | Freq: Once | INTRAVENOUS | Status: AC
  Administered 2015-07-04: 500 [IU] via INTRAVENOUS

## 2015-07-04 NOTE — Progress Notes (Addendum)
[place]  Telephone:([phone]) [phone] [phone]) [phone]     ID: Tammie Robinson OB: 06/10/1966  MR#: [id number]  CZY#:[id number]  Patient Care Team: Jackolyn Confer, MD as PCP - General (Internal Medicine)  CHIEF COMPLAINT:  Rectal cancer, clinical TXN2M0, at least stage III   HISTORY OF PRESENT ILLNESS:  -  s/p neoadjuvant radiation and concurrent 5FU chemotherapy completed end of July/early Aug 2015.  (initially diagnosed on 12/22/13 - upper GI showed an ulcer and colonoscopy showed a large rectal mass at the most proximal extent 4 cm from the anus). Then had Abdominoperineal resection, radical hysterectomy, vaginectomy with removal of tubes/ovaries with rectus flap myocutaneous vaginal reconstruction at [facility] on 04/12/14. Surgical pathology reports residual invasive rectal adenocarcinoma low-grade, tumor invades muscularis propria, margins uninvolved, no lymphovascular or perineural invasion, 27 lymph nodes examined negative for metastasis (ypT2pN0).  Patient received adjuvant chemotherapy with FOLFOX regimen x 12 doses (05/30/14 - 11/21/14).  Current status: Tammie Robinson returns to our clinic for follow-up visit. She has done reasonably well since her last appointment. She continues to complain of muscle pains, rectal pain after sitting for long period of time. She also complains about arm or leg pain especially during the nighttime. Pain appears to be controlled by dilaudid and gabapentin. She denies any diarrhea, night sweats, weight loss. In fact, she has gained significant weight back.   REVIEW OF SYSTEMS:   ROS As in HPI above. In addition, no new headaches or focal weakness. No new mood disturbances. No sore throat or dysphagia. No hemoptysis or chest pain. No dizziness or palpitation. No new constipation, diarrhea, dysuria or hematuria. No new skin rash or bleeding symptoms. No new paresthesias in extremities. PS ECOG 0.  PAST MEDICAL  HISTORY: Past Medical History  Diagnosis Date  . Blood in stool   . Hypertension   . Migraine   . Asthma     seasonal,never hospitalized  . Rectal cancer ([place])   . Migraines   . History of ERCP     PAST SURGICAL HISTORY: Past Surgical History  Procedure Laterality Date  . Cholecystectomy  2012    Dr. Jamal Collin  . Vaginal delivery      2, preterm labor    FAMILY HISTORY Family History  Problem Relation Age of Onset  . Arthritis Mother   . Stroke Mother   . Hypertension Mother   . Diabetes Mother   . Arthritis Father   . Heart disease Father 63    s/p CABG  . Stroke Father   . Hypertension Father   . Alcohol abuse Maternal Grandmother   . Diabetes Maternal Grandmother   . Alcohol abuse Maternal Grandfather   . Diabetes Maternal Grandfather   . Cancer Paternal Grandmother     breast  . Heart disease Paternal Grandfather     Social History  Substance Use Topics  . Smoking status: Never Smoker   . Smokeless tobacco: Not on file  . Alcohol Use: No   Allergies  Allergen Reactions  . Lisinopril Hives and Rash    Current Outpatient Prescriptions  Medication Sig Dispense Refill  . acetaminophen (TYLENOL) 325 MG tablet Take by mouth.    Marland Kitchen albuterol (PROVENTIL HFA;VENTOLIN HFA) 108 (90 BASE) MCG/ACT inhaler Inhale 1-2 puffs into the lungs every 4 (four) hours as needed for wheezing or shortness of breath. 1 Inhaler 3  . ALPRAZolam (XANAX) 0.25 MG tablet take 1 tablet by mouth three times a day if needed for anxiety  90 tablet 1  . amLODipine (NORVASC) 5 MG tablet Take 1 tablet (5 mg total) by mouth daily. 90 tablet 3  . ferrous sulfate 325 (65 FE) MG tablet Take by mouth.    . gabapentin (NEURONTIN) 100 MG capsule take 2 capsules by mouth three times a day 180 capsule 3  . HYDROmorphone (DILAUDID) 2 MG tablet Take 1 tablet (2 mg total) by mouth every 4 (four) hours as needed for severe pain. 60 tablet 0  . lidocaine-prilocaine (EMLA) cream   0  . potassium chloride SA  (K-DUR,KLOR-CON) 10 MEQ tablet Take 1 tablet (10 mEq total) by mouth daily. 6 tablet 0  . prochlorperazine (COMPAZINE) 10 MG tablet   0  . zolpidem (AMBIEN) 10 MG tablet Take 1 tablet (10 mg total) by mouth at bedtime as needed for sleep. 30 tablet 0   Current Facility-Administered Medications  Medication Dose Route Frequency Provider Last Rate Last Dose  . sodium chloride 0.9 % injection 10 mL  10 mL Intracatheter PRN Cammie Sickle, MD       Facility-Administered Medications Ordered in Other Visits  Medication Dose Route Frequency Provider Last Rate Last Dose  . heparin lock flush 100 unit/mL  500 Units Intravenous Once Leia Alf, MD      . sodium chloride 0.9 % injection 10 mL  10 mL Intravenous PRN Leia Alf, MD      . sodium chloride 0.9 % injection 10 mL  10 mL Intravenous PRN Leia Alf, MD   10 mL at 11/28/14 1120    OBJECTIVE: There were no vitals filed for this visit.   There is no weight on file to calculate BMI.     General: Alert and oriented, in no acute distress. No icterus. HEENT: EOMs intact. No cervical adenopathy. CVS: S1S2, regular. LUNGS: b/l good air entry, no rhonchi ABDOMEN: soft, no hepatomegaly or masses clinically NEURO: grossly nonfocal, cranial nerves intact. Gait unremarkable. EXTREMITIES: no pedal edema.   LAB RESULTS: Recent Results (from the past 2160 hour(s))  CBC with Differential     Status: Abnormal   Collection Time: 07/04/15 10:10 AM  Result Value Ref Range   WBC 3.5 (L) 3.6 - 11.0 K/uL   RBC 4.40 3.80 - 5.20 MIL/uL   Hemoglobin 12.3 12.0 - 16.0 g/dL   HCT 36.6 35.0 - 47.0 %   MCV 83.3 80.0 - 100.0 fL   MCH 28.1 26.0 - 34.0 pg   MCHC 33.7 32.0 - 36.0 g/dL   RDW 14.8 (H) 11.5 - 14.5 %   Platelets 120 (L) 150 - 440 K/uL   Neutrophils Relative % 69 %   Neutro Abs 2.4 1.4 - 6.5 K/uL   Lymphocytes Relative 15 %   Lymphs Abs 0.5 (L) 1.0 - 3.6 K/uL   Monocytes Relative 10 %   Monocytes Absolute 0.4 0.2 - 0.9 K/uL    Eosinophils Relative 5 %   Eosinophils Absolute 0.2 0 - 0.7 K/uL   Basophils Relative 1 %   Basophils Absolute 0.0 0 - 0.1 K/uL  Comprehensive metabolic panel     Status: Abnormal   Collection Time: 07/04/15 10:10 AM  Result Value Ref Range   Sodium 138 135 - 145 mmol/L   Potassium 3.4 (L) 3.5 - 5.1 mmol/L   Chloride 106 101 - 111 mmol/L   CO2 26 22 - 32 mmol/L   Glucose, Bld 130 (H) 65 - 99 mg/dL   BUN 16 6 - 20 mg/dL   Creatinine, Ser  0.77 0.44 - 1.00 mg/dL   Calcium 8.8 (L) 8.9 - 10.3 mg/dL   Total Protein 7.0 6.5 - 8.1 g/dL   Albumin 3.7 3.5 - 5.0 g/dL   AST 40 15 - 41 U/L   ALT 43 14 - 54 U/L   Alkaline Phosphatase 177 (H) 38 - 126 U/L   Total Bilirubin 0.5 0.3 - 1.2 mg/dL   GFR calc non Af Amer >60 >60 mL/min   GFR calc Af Amer >60 >60 mL/min    Comment: (NOTE) The eGFR has been calculated using the CKD EPI equation. This calculation has not been validated in all clinical situations. eGFR's persistently <60 mL/min signify possible Chronic Kidney Disease.    Anion gap 6 5 - 15     02/28/15 - CEA normal at 2.2 (was 2.4 on 08/22/14, 16.3 in June 2015)           ASSESSMENT/PLAN:   1. Rectal cancer, clinical TXN2M0, at least stage III -  s/p neoadjuvant radiation and concurrent 5FU chemotherapy completed end of July/early Aug 2015. (initially diagnosed on 12/22/13 when upper GI showed an ulcer and colonoscopy showed a large rectal mass at the most proximal extent 4 cm from the anus). Then had Abdominoperineal resection, radical hysterectomy, vaginectomy with removal of tubes/ovaries with rectus flap myocutaneous vaginal reconstruction at [facility] on 04/12/14. Surgical pathology reports residual invasive rectal adenocarcinoma low-grade, tumor invades muscularis propria, margins uninvolved, no lymphovascular or perineural invasion, 27 lymph nodes examined negative for metastasis (ypT2pN0). Patient got adjuvant chemotherapy with FOLFOX regimen x 12 doses (05/30/14 -  11/21/14). -  have reviewed labs from today and d/w patient.  Clinically she has no evidence of recurrence. I personally reviewed the CAT scan images from August 2016, which also did not show any evidence of recurrent disease. She has not had a colonoscopy yet, mostly due to financial problems. We discussed the potential way to have colonoscopy done, and recommended her to discuss it with her surgeons, who hopefully might be able to arrange colonoscopy to be done not in affiliated clinic but rather an [facility], which has the financial department current capable of handling her case. She will need another CT abdomen and pelvis in August 2017. She will return to our clinic in 6 months for follow-up visit.  2. Pain since abdominal surgery - under control, continue oral Dilaudid as-needed.   3. Iron-deficiency anemia - hemoglobin is steady, continue oral iron and monitor. Also likely anemic from pelvic radiation. 4. 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 Port flush q6 weeks 6. Persistent left index finger blood-filled blister, recommended to discuss with her primary care physician or go to walk-in clinic. 7. In between visits, patient advised to call or come to ER in case of any new symptoms or acute sickness. Patient is agreeable to this plan.   Roxana Hires, MD   07/04/2015 10:16 AM

## 2015-07-04 NOTE — Progress Notes (Signed)
Pt here for 6 mos. F/u and she still had neuropathy of the feet and it hurts more at night. Also she has muscle pains in stomach, rectal pain after sitting for a while.  The muscle pains can hurt with bedning, sneezing, coughing.  Pain is better than it has been.  She also has a blood blister on her finger and it got better then got worse after she hit is on something. She has bandaid on right now. No changes with bowels/colostomy

## 2015-07-05 LAB — CEA: CEA: 2.2 ng/mL (ref 0.0–4.7)

## 2015-08-03 ENCOUNTER — Telehealth: Payer: Self-pay | Admitting: *Deleted

## 2015-08-03 DIAGNOSIS — C20 Malignant neoplasm of rectum: Secondary | ICD-10-CM

## 2015-08-03 MED ORDER — HYDROMORPHONE HCL 2 MG PO TABS: 2.0000 mg | ORAL_TABLET | ORAL | Status: DC | PRN

## 2015-08-03 NOTE — Telephone Encounter (Signed)
Informed that prescription is ready to pick up  

## 2015-08-15 ENCOUNTER — Inpatient Hospital Stay: Payer: Self-pay | Attending: Internal Medicine

## 2015-08-15 DIAGNOSIS — Z452 Encounter for adjustment and management of vascular access device: Secondary | ICD-10-CM | POA: Insufficient documentation

## 2015-08-15 DIAGNOSIS — Z85048 Personal history of other malignant neoplasm of rectum, rectosigmoid junction, and anus: Secondary | ICD-10-CM | POA: Insufficient documentation

## 2015-08-15 DIAGNOSIS — C801 Malignant (primary) neoplasm, unspecified: Secondary | ICD-10-CM

## 2015-08-15 MED ORDER — HEPARIN SOD (PORK) LOCK FLUSH 100 UNIT/ML IV SOLN
INTRAVENOUS | Status: AC
  Filled 2015-08-15: qty 5

## 2015-08-15 MED ORDER — HEPARIN SOD (PORK) LOCK FLUSH 100 UNIT/ML IV SOLN
500.0000 [IU] | Freq: Once | INTRAVENOUS | Status: AC
  Administered 2015-08-15: 500 [IU] via INTRAVENOUS

## 2015-08-15 MED ORDER — SODIUM CHLORIDE 0.9 % IJ SOLN
10.0000 mL | INTRAMUSCULAR | Status: DC | PRN
  Administered 2015-08-15: 10 mL via INTRAVENOUS
  Filled 2015-08-15: qty 10

## 2015-08-22 ENCOUNTER — Other Ambulatory Visit: Payer: Self-pay | Admitting: *Deleted

## 2015-08-22 NOTE — Telephone Encounter (Signed)
Called pharmacy and gave refill for same as last time # 180 and 3 refills per Dr. Hershal Coria verbal consent

## 2015-08-25 ENCOUNTER — Other Ambulatory Visit: Payer: Self-pay | Admitting: Internal Medicine

## 2015-08-28 NOTE — Telephone Encounter (Signed)
Last OV and last filled 05/01/15... Okay to refill?

## 2015-09-25 ENCOUNTER — Other Ambulatory Visit: Payer: Self-pay | Admitting: *Deleted

## 2015-09-25 DIAGNOSIS — C20 Malignant neoplasm of rectum: Secondary | ICD-10-CM

## 2015-09-25 MED ORDER — HYDROMORPHONE HCL 2 MG PO TABS: 2.0000 mg | ORAL_TABLET | ORAL | Status: DC | PRN

## 2015-09-26 ENCOUNTER — Inpatient Hospital Stay: Payer: Self-pay | Attending: Family Medicine

## 2015-09-26 DIAGNOSIS — Z85048 Personal history of other malignant neoplasm of rectum, rectosigmoid junction, and anus: Secondary | ICD-10-CM | POA: Insufficient documentation

## 2015-09-26 DIAGNOSIS — C20 Malignant neoplasm of rectum: Secondary | ICD-10-CM

## 2015-09-26 DIAGNOSIS — Z452 Encounter for adjustment and management of vascular access device: Secondary | ICD-10-CM | POA: Insufficient documentation

## 2015-09-26 MED ORDER — HEPARIN SOD (PORK) LOCK FLUSH 100 UNIT/ML IV SOLN
500.0000 [IU] | Freq: Once | INTRAVENOUS | Status: AC
  Administered 2015-09-26: 500 [IU] via INTRAVENOUS

## 2015-09-26 MED ORDER — HEPARIN SOD (PORK) LOCK FLUSH 100 UNIT/ML IV SOLN
INTRAVENOUS | Status: AC
  Filled 2015-09-26: qty 5

## 2015-09-26 MED ORDER — SODIUM CHLORIDE 0.9% FLUSH
10.0000 mL | Freq: Once | INTRAVENOUS | Status: AC
  Administered 2015-09-26: 10 mL via INTRAVENOUS
  Filled 2015-09-26: qty 10

## 2015-10-30 ENCOUNTER — Encounter: Payer: Self-pay | Admitting: Internal Medicine

## 2015-10-30 ENCOUNTER — Ambulatory Visit (INDEPENDENT_AMBULATORY_CARE_PROVIDER_SITE_OTHER): Payer: Self-pay | Admitting: Internal Medicine

## 2015-10-30 VITALS — BP 144/80 | HR 63 | Temp 98.3°F | Ht 65.0 in | Wt 212.4 lb

## 2015-10-30 DIAGNOSIS — I10 Essential (primary) hypertension: Secondary | ICD-10-CM

## 2015-10-30 DIAGNOSIS — F329 Major depressive disorder, single episode, unspecified: Secondary | ICD-10-CM

## 2015-10-30 DIAGNOSIS — F418 Other specified anxiety disorders: Secondary | ICD-10-CM

## 2015-10-30 DIAGNOSIS — F419 Anxiety disorder, unspecified: Principal | ICD-10-CM

## 2015-10-30 DIAGNOSIS — C20 Malignant neoplasm of rectum: Secondary | ICD-10-CM

## 2015-10-30 MED ORDER — ALPRAZOLAM 0.25 MG PO TABS: ORAL_TABLET | ORAL | Status: DC

## 2015-10-30 MED ORDER — FLUOXETINE HCL 20 MG PO TABS: 20.0000 mg | ORAL_TABLET | Freq: Every day | ORAL | Status: DC

## 2015-10-30 NOTE — Progress Notes (Signed)
Subjective:    Patient ID: Tammie Robinson, female    DOB: 12-25-1965, 50 y.o.   MRN: [id number]  HPI  49YO female presents for follow up.  Continues to feel tired. Having trouble sleeping. Lies awake. Tearful frequently. Struggling with her daughter leaving for college.  HTN - BP well controlled at home. Compliant with medication.  Obesity - Working on limiting soda. Notes changes in taste after cancer treatment.  Planning to follow up at [facility] or [facility] for rectal cancer.  Wt Readings from Last 3 Encounters:  10/30/15 212 lb 6 oz (96.333 kg)  07/04/15 200 lb 9.9 oz (91 kg)  05/01/15 187 lb 4 oz (84.936 kg)   BP Readings from Last 3 Encounters:  10/30/15 144/80  07/04/15 145/88  05/01/15 143/82    Past Medical History  Diagnosis Date  . Blood in stool   . Hypertension   . Migraine   . Asthma     seasonal,never hospitalized  . Rectal cancer ([place])   . Migraines   . History of ERCP    Family History  Problem Relation Age of Onset  . Arthritis Mother   . Stroke Mother   . Hypertension Mother   . Diabetes Mother   . Arthritis Father   . Heart disease Father 69    s/p CABG  . Stroke Father   . Hypertension Father   . Alcohol abuse Maternal Grandmother   . Diabetes Maternal Grandmother   . Alcohol abuse Maternal Grandfather   . Diabetes Maternal Grandfather   . Cancer Paternal Grandmother     breast  . Heart disease Paternal Grandfather    Past Surgical History  Procedure Laterality Date  . Cholecystectomy  2012    Dr. Jamal Collin  . Vaginal delivery      2, preterm labor   Social History   Social History  . Marital Status: Married    Spouse Name: N/A  . Number of Children: N/A  . Years of Education: N/A   Social History Main Topics  . Smoking status: Never Smoker   . Smokeless tobacco: None  . Alcohol Use: No  . Drug Use: No  . Sexual Activity: Not Asked   Other Topics Concern  . None   Social History Narrative   Lives in [place] with  husband, has 2 children, 1 in college at [employer].      Work - Chiropractor   Diet - regular   Exercise - none recently    Review of Systems  Constitutional: Negative for fever, chills, appetite change, fatigue and unexpected weight change.  Eyes: Negative for visual disturbance.  Respiratory: Negative for shortness of breath.   Cardiovascular: Negative for chest pain and leg swelling.  Gastrointestinal: Negative for nausea, vomiting, abdominal pain, diarrhea and constipation.  Skin: Negative for color change and rash.  Hematological: Negative for adenopathy. Does not bruise/bleed easily.  Psychiatric/Behavioral: Positive for sleep disturbance and dysphoric mood. Negative for suicidal ideas. The patient is nervous/anxious.        Objective:    BP 144/80 mmHg  Pulse 63  Temp(Src) 98.3 F (36.8 C) (Oral)  Ht 5\' 5"  (1.651 m)  Wt 212 lb 6 oz (96.333 kg)  BMI 35.34 kg/m2  SpO2 100%  LMP 12/11/2013 Physical Exam  Constitutional: She is oriented to person, place, and time. She appears well-developed and well-nourished. No distress.  HENT:  Head: Normocephalic and atraumatic.  Right Ear: External ear normal.  Left Ear: External ear normal.  Nose: Nose normal.  Mouth/Throat: Oropharynx is clear and moist. No oropharyngeal exudate.  Eyes: Conjunctivae are normal. Pupils are equal, round, and reactive to light. Right eye exhibits no discharge. Left eye exhibits no discharge. No scleral icterus.  Neck: Normal range of motion. Neck supple. No tracheal deviation present. No thyromegaly present.  Cardiovascular: Normal rate, regular rhythm, normal heart sounds and intact distal pulses.  Exam reveals no gallop and no friction rub.   No murmur heard. Pulmonary/Chest: Effort normal and breath sounds normal. No respiratory distress. She has no wheezes. She has no rales. She exhibits no tenderness.  Musculoskeletal: Normal range of motion. She exhibits no edema or tenderness.    Lymphadenopathy:    She has no cervical adenopathy.  Neurological: She is alert and oriented to person, place, and time. No cranial nerve deficit. She exhibits normal muscle tone. Coordination normal.  Skin: Skin is warm and dry. No rash noted. She is not diaphoretic. No erythema. No pallor.  Psychiatric: Her speech is normal and behavior is normal. Judgment and thought content normal. Her mood appears anxious. Cognition and memory are normal. She exhibits a depressed mood.          Assessment & Plan:   Problem List Items Addressed This Visit      Unprioritized   Anxiety and depression - Primary    Worsening anxiety and depression. Will start Fluoxetine. Follow up in 4 weeks and by email. She will also look into counseling.      Hypertension    BP Readings from Last 3 Encounters:  10/30/15 144/80  07/04/15 145/88  05/01/15 143/82   BP slightly elevated, however better controlled at home. Will continue current medications. Labs as scheduled in 06/2016      Rectal cancer [facility])    Encouraged continued follow up with oncology.      Relevant Medications   ALPRAZolam (XANAX) 0.25 MG tablet       Return in about 4 weeks (around 11/27/2015) for Recheck.  Ronette Deter, MD Internal Medicine [facility]

## 2015-10-30 NOTE — Assessment & Plan Note (Signed)
Worsening anxiety and depression. Will start Fluoxetine. Follow up in 4 weeks and by email. She will also look into counseling.

## 2015-10-30 NOTE — Progress Notes (Signed)
Pre visit review using our clinic review tool, if applicable. No additional management support is needed unless otherwise documented below in the visit note. 

## 2015-10-30 NOTE — Patient Instructions (Signed)
Start Fluoxetine 20mg  daily in the morning.  Email with update.  Follow up in 4 weeks.

## 2015-10-30 NOTE — Assessment & Plan Note (Signed)
Encouraged continued follow up with oncology.

## 2015-10-30 NOTE — Assessment & Plan Note (Signed)
BP Readings from Last 3 Encounters:  10/30/15 144/80  07/04/15 145/88  05/01/15 143/82   BP slightly elevated, however better controlled at home. Will continue current medications. Labs as scheduled in 06/2016

## 2015-11-07 ENCOUNTER — Inpatient Hospital Stay: Payer: Self-pay | Attending: Internal Medicine

## 2015-11-07 DIAGNOSIS — Z95828 Presence of other vascular implants and grafts: Secondary | ICD-10-CM

## 2015-11-07 DIAGNOSIS — Z85048 Personal history of other malignant neoplasm of rectum, rectosigmoid junction, and anus: Secondary | ICD-10-CM | POA: Insufficient documentation

## 2015-11-07 DIAGNOSIS — Z452 Encounter for adjustment and management of vascular access device: Secondary | ICD-10-CM | POA: Insufficient documentation

## 2015-11-07 MED ORDER — HEPARIN SOD (PORK) LOCK FLUSH 100 UNIT/ML IV SOLN
500.0000 [IU] | Freq: Once | INTRAVENOUS | Status: AC
  Administered 2015-11-07: 500 [IU] via INTRAVENOUS

## 2015-11-07 MED ORDER — SODIUM CHLORIDE 0.9% FLUSH
10.0000 mL | INTRAVENOUS | Status: DC | PRN
  Administered 2015-11-07: 10 mL via INTRAVENOUS
  Filled 2015-11-07: qty 10

## 2015-12-07 ENCOUNTER — Ambulatory Visit: Payer: Self-pay | Admitting: Internal Medicine

## 2015-12-25 ENCOUNTER — Encounter: Payer: Self-pay | Admitting: Internal Medicine

## 2016-01-02 ENCOUNTER — Other Ambulatory Visit: Payer: Self-pay

## 2016-01-02 ENCOUNTER — Ambulatory Visit: Payer: Self-pay | Admitting: Family Medicine

## 2016-01-03 ENCOUNTER — Inpatient Hospital Stay: Payer: Self-pay

## 2016-01-03 ENCOUNTER — Inpatient Hospital Stay: Payer: Self-pay | Attending: Family Medicine

## 2016-01-03 ENCOUNTER — Inpatient Hospital Stay (HOSPITAL_BASED_OUTPATIENT_CLINIC_OR_DEPARTMENT_OTHER): Payer: Self-pay | Admitting: Family Medicine

## 2016-01-03 VITALS — BP 157/95 | HR 60 | Temp 97.9°F | Resp 18 | Wt 212.0 lb

## 2016-01-03 DIAGNOSIS — D509 Iron deficiency anemia, unspecified: Secondary | ICD-10-CM

## 2016-01-03 DIAGNOSIS — Z90722 Acquired absence of ovaries, bilateral: Secondary | ICD-10-CM

## 2016-01-03 DIAGNOSIS — Z79899 Other long term (current) drug therapy: Secondary | ICD-10-CM

## 2016-01-03 DIAGNOSIS — Z90710 Acquired absence of both cervix and uterus: Secondary | ICD-10-CM

## 2016-01-03 DIAGNOSIS — D696 Thrombocytopenia, unspecified: Secondary | ICD-10-CM

## 2016-01-03 DIAGNOSIS — C20 Malignant neoplasm of rectum: Secondary | ICD-10-CM | POA: Insufficient documentation

## 2016-01-03 DIAGNOSIS — Z9221 Personal history of antineoplastic chemotherapy: Secondary | ICD-10-CM

## 2016-01-03 DIAGNOSIS — Z95828 Presence of other vascular implants and grafts: Secondary | ICD-10-CM

## 2016-01-03 DIAGNOSIS — Z803 Family history of malignant neoplasm of breast: Secondary | ICD-10-CM | POA: Insufficient documentation

## 2016-01-03 DIAGNOSIS — G629 Polyneuropathy, unspecified: Secondary | ICD-10-CM

## 2016-01-03 DIAGNOSIS — Z923 Personal history of irradiation: Secondary | ICD-10-CM

## 2016-01-03 DIAGNOSIS — I10 Essential (primary) hypertension: Secondary | ICD-10-CM | POA: Insufficient documentation

## 2016-01-03 DIAGNOSIS — Z8669 Personal history of other diseases of the nervous system and sense organs: Secondary | ICD-10-CM

## 2016-01-03 LAB — CBC WITH DIFFERENTIAL/PLATELET
BASOS ABS: 0 10*3/uL (ref 0–0.1)
BASOS PCT: 1 %
EOS PCT: 3 %
Eosinophils Absolute: 0.1 10*3/uL (ref 0–0.7)
HEMATOCRIT: 39.2 % (ref 35.0–47.0)
Hemoglobin: 13.3 g/dL (ref 12.0–16.0)
LYMPHS PCT: 15 %
Lymphs Abs: 0.6 10*3/uL — ABNORMAL LOW (ref 1.0–3.6)
MCH: 28.5 pg (ref 26.0–34.0)
MCHC: 33.8 g/dL (ref 32.0–36.0)
MCV: 84.3 fL (ref 80.0–100.0)
MONO ABS: 0.3 10*3/uL (ref 0.2–0.9)
MONOS PCT: 8 %
NEUTROS ABS: 2.7 10*3/uL (ref 1.4–6.5)
Neutrophils Relative %: 73 %
Platelets: 126 10*3/uL — ABNORMAL LOW (ref 150–440)
RBC: 4.65 MIL/uL (ref 3.80–5.20)
RDW: 13.7 % (ref 11.5–14.5)
WBC: 3.6 10*3/uL (ref 3.6–11.0)

## 2016-01-03 LAB — COMPREHENSIVE METABOLIC PANEL
ALT: 42 U/L (ref 14–54)
ANION GAP: 6 (ref 5–15)
AST: 29 U/L (ref 15–41)
Albumin: 4.1 g/dL (ref 3.5–5.0)
Alkaline Phosphatase: 130 U/L — ABNORMAL HIGH (ref 38–126)
BILIRUBIN TOTAL: 0.8 mg/dL (ref 0.3–1.2)
BUN: 13 mg/dL (ref 6–20)
CHLORIDE: 106 mmol/L (ref 101–111)
CO2: 26 mmol/L (ref 22–32)
Calcium: 8.6 mg/dL — ABNORMAL LOW (ref 8.9–10.3)
Creatinine, Ser: 0.74 mg/dL (ref 0.44–1.00)
GFR calc Af Amer: >60 mL/min (ref >60)
Glucose, Bld: 98 mg/dL (ref 65–99)
POTASSIUM: 3.8 mmol/L (ref 3.5–5.1)
Sodium: 138 mmol/L (ref 135–145)
TOTAL PROTEIN: 7.3 g/dL (ref 6.5–8.1)

## 2016-01-03 MED ORDER — HEPARIN SOD (PORK) LOCK FLUSH 100 UNIT/ML IV SOLN
500.0000 [IU] | Freq: Once | INTRAVENOUS | Status: AC
  Administered 2016-01-03: 500 [IU] via INTRAVENOUS

## 2016-01-03 MED ORDER — SODIUM CHLORIDE 0.9% FLUSH
10.0000 mL | INTRAVENOUS | Status: AC | PRN
  Administered 2016-01-03: 10 mL via INTRAVENOUS
  Filled 2016-01-03: qty 10

## 2016-01-03 MED ORDER — GABAPENTIN 300 MG PO CAPS: 300.0000 mg | ORAL_CAPSULE | Freq: Three times a day (TID) | ORAL | Status: DC

## 2016-01-03 NOTE — Progress Notes (Signed)
[place]  [phone] [phone]     ID: Tammie Robinson OB: 09/12/1965  MR#: [id number]  VEH#:[id number]  Patient Care Team: Jackolyn Confer, MD as PCP - General (Internal Medicine) Rico Junker, RN as Registered Nurse Theodore Demark, RN as Registered Nurse  CHIEF COMPLAINT:  Rectal cancer, clinical TXN2M0, at least stage III   HISTORY OF PRESENT ILLNESS:  S/p neoadjuvant radiation and concurrent 5FU chemotherapy completed end of July/early Aug 2015.  (initially diagnosed on 12/22/13 - upper GI showed an ulcer and colonoscopy showed a large rectal mass at the most proximal extent 4 cm from the anus). Then had Abdominoperineal resection, radical hysterectomy, vaginectomy with removal of tubes/ovaries with rectus flap myocutaneous vaginal reconstruction at [facility] on 04/12/14. Surgical pathology reports residual invasive rectal adenocarcinoma low-grade, tumor invades muscularis propria, margins uninvolved, no lymphovascular or perineural invasion, 27 lymph nodes examined negative for metastasis (ypT2pN0).  Patient received adjuvant chemotherapy with FOLFOX regimen x 12 doses (05/30/14 - 11/21/14).  Current status: Patient is here for follow-up regarding rectal cancer. Patient reports that she has weaned herself off of her Dilaudid for abdominal discomfort post surgery. She continues to have some intermittent pain but she is not requiring any medications. 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 She is currently on gabapentin 200 mg 3 times a day. Otherwise patient reports feeling fairly well.  REVIEW OF SYSTEMS:   ROS As in HPI above. In addition, no new headaches or focal weakness. No new mood disturbances. No sore throat or dysphagia. No hemoptysis or chest pain. No dizziness or palpitation. No new constipation, diarrhea, dysuria or hematuria. No new skin rash or bleeding symptoms. No  new paresthesias in extremities. PS ECOG 0.  PAST MEDICAL HISTORY: Past Medical History  Diagnosis Date  . Blood in stool   . Hypertension   . Migraine   . Asthma     seasonal,never hospitalized  . Rectal cancer ([place])   . Migraines   . History of ERCP     PAST SURGICAL HISTORY: Past Surgical History  Procedure Laterality Date  . Cholecystectomy  2012    Dr. Jamal Collin  . Vaginal delivery      2, preterm labor    FAMILY HISTORY Family History  Problem Relation Age of Onset  . Arthritis Mother   . Stroke Mother   . Hypertension Mother   . Diabetes Mother   . Arthritis Father   . Heart disease Father 53    s/p CABG  . Stroke Father   . Hypertension Father   . Alcohol abuse Maternal Grandmother   . Diabetes Maternal Grandmother   . Alcohol abuse Maternal Grandfather   . Diabetes Maternal Grandfather   . Cancer Paternal Grandmother     breast  . Heart disease Paternal Grandfather     Social History  Substance Use Topics  . Smoking status: Never Smoker   . Smokeless tobacco: Not on file  . Alcohol Use: No   Allergies  Allergen Reactions  . Lisinopril Hives and Rash    Current Outpatient Prescriptions  Medication Sig Dispense Refill  . acetaminophen (TYLENOL) 325 MG tablet Take by mouth.    Marland Kitchen albuterol (PROVENTIL HFA;VENTOLIN HFA) 108 (90 BASE) MCG/ACT inhaler Inhale 1-2 puffs into the lungs every 4 (four) hours as needed for wheezing or shortness of breath. 1 Inhaler 3  . ALPRAZolam (XANAX) 0.25 MG tablet take 1 tablet by mouth  three times a day if needed for anxiety 90 tablet 1  . amLODipine (NORVASC) 5 MG tablet Take 1 tablet (5 mg total) by mouth daily. 90 tablet 3  . ferrous sulfate 325 (65 FE) MG tablet Take by mouth 2 (two) times daily with a meal.     . FLUoxetine (PROZAC) 20 MG capsule Take 20 mg by mouth daily.  6  . gabapentin (NEURONTIN) 100 MG capsule take 2 capsules by mouth three times a day 180 capsule 3  . lidocaine-prilocaine (EMLA) cream   0    . prochlorperazine (COMPAZINE) 10 MG tablet   0  . HYDROmorphone (DILAUDID) 2 MG tablet Take 1 tablet (2 mg total) by mouth every 4 (four) hours as needed for severe pain. (Patient not taking: Reported on 01/03/2016) 60 tablet 0   No current facility-administered medications for this visit.   Facility-Administered Medications Ordered in Other Visits  Medication Dose Route Frequency Provider Last Rate Last Dose  . heparin lock flush 100 unit/mL  500 Units Intravenous Once Leia Alf, MD      . sodium chloride 0.9 % injection 10 mL  10 mL Intravenous PRN Leia Alf, MD      . sodium chloride 0.9 % injection 10 mL  10 mL Intravenous PRN Leia Alf, MD   10 mL at 11/28/14 1120  . sodium chloride flush (NS) 0.9 % injection 10 mL  10 mL Intravenous PRN Evlyn Kanner, NP   10 mL at 01/03/16 1413    OBJECTIVE: Filed Vitals:   01/03/16 1433  BP: 157/95  Pulse: 60  Temp: 97.9 F (36.6 C)  Resp: 18     Body mass index is 35.28 kg/(m^2).     General: Alert and oriented, in no acute distress. No icterus. HEENT: EOMs intact. No cervical adenopathy. CVS: S1S2, regular. LUNGS: b/l good air entry, no rhonchi ABDOMEN: soft, no hepatomegaly or masses clinically NEURO: grossly nonfocal, cranial nerves intact. Gait unremarkable. EXTREMITIES: no pedal edema.   LAB RESULTS: Recent Results (from the past 2160 hour(s))  CBC with Differential     Status: Abnormal   Collection Time: 01/03/16  2:07 PM  Result Value Ref Range   WBC 3.6 3.6 - 11.0 K/uL   RBC 4.65 3.80 - 5.20 MIL/uL   Hemoglobin 13.3 12.0 - 16.0 g/dL   HCT 39.2 35.0 - 47.0 %   MCV 84.3 80.0 - 100.0 fL   MCH 28.5 26.0 - 34.0 pg   MCHC 33.8 32.0 - 36.0 g/dL   RDW 13.7 11.5 - 14.5 %   Platelets 126 (L) 150 - 440 K/uL   Neutrophils Relative % 73 %   Neutro Abs 2.7 1.4 - 6.5 K/uL   Lymphocytes Relative 15 %   Lymphs Abs 0.6 (L) 1.0 - 3.6 K/uL   Monocytes Relative 8 %   Monocytes Absolute 0.3 0.2 - 0.9 K/uL    Eosinophils Relative 3 %   Eosinophils Absolute 0.1 0 - 0.7 K/uL   Basophils Relative 1 %   Basophils Absolute 0.0 0 - 0.1 K/uL  Comprehensive metabolic panel     Status: Abnormal   Collection Time: 01/03/16  2:07 PM  Result Value Ref Range   Sodium 138 135 - 145 mmol/L   Potassium 3.8 3.5 - 5.1 mmol/L   Chloride 106 101 - 111 mmol/L   CO2 26 22 - 32 mmol/L   Glucose, Bld 98 65 - 99 mg/dL   BUN 13 6 - 20 mg/dL  Creatinine, Ser 0.74 0.44 - 1.00 mg/dL   Calcium 8.6 (L) 8.9 - 10.3 mg/dL   Total Protein 7.3 6.5 - 8.1 g/dL   Albumin 4.1 3.5 - 5.0 g/dL   AST 29 15 - 41 U/L   ALT 42 14 - 54 U/L   Alkaline Phosphatase 130 (H) 38 - 126 U/L   Total Bilirubin 0.8 0.3 - 1.2 mg/dL   GFR calc non Af Amer >60 >60 mL/min   GFR calc Af Amer >60 >60 mL/min    Comment: (NOTE) The eGFR has been calculated using the CKD EPI equation. This calculation has not been validated in all clinical situations. eGFR's persistently <60 mL/min signify possible Chronic Kidney Disease.    Anion gap 6 5 - 15     02/28/15 - CEA normal at 2.2 (was 2.4 on 08/22/14, 16.3 in June 2015)           ASSESSMENT/PLAN:   1. Rectal cancer, clinical TXN2M0, at least stage III -  s/p neoadjuvant radiation and concurrent 5FU chemotherapy completed end of July/early Aug 2015. (initially diagnosed on 12/22/13 when upper GI showed an ulcer and colonoscopy showed a large rectal mass at the most proximal extent 4 cm from the anus). Then had Abdominoperineal resection, radical hysterectomy, vaginectomy with removal of tubes/ovaries with rectus flap myocutaneous vaginal reconstruction at [facility] on 04/12/14. Surgical pathology reports residual invasive rectal adenocarcinoma low-grade, tumor invades muscularis propria, margins uninvolved, no lymphovascular or perineural invasion, 27 lymph nodes examined negative for metastasis (ypT2pN0). Patient got adjuvant chemotherapy with FOLFOX regimen x 12 doses (05/30/14 -  11/21/14). Clinically she has no evidence of recurrence. Discussed with patient that she requires follow-up for colonoscopy, can refer her to Dr. Dorothey Baseman office as she is no longer seeing anyone at [facility]. She will need another CT abdomen and pelvis in August 2017. She will return to our clinic in 6 months for follow-up visit. 2. Iron-deficiency anemia - hemoglobin is steady, continue oral iron and monitor. Also likely anemic from pelvic radiation. 3. Thrombocytopenia - thought to be secondary to pelvic radiation.  4. Patient would like referral back to Dr. Rolin Barry office previously [facility] surgical for removal of Port-A-Cath. 5. Screening mammogram. Patient advised to undergo screening mammogram as she currently meets criteria. Patient states that she has no insurance coverage and this is why she has not had procedure. Information given regarding [facility] program and appointment scheduled with Web designer.  In between visits, patient advised to call or come to ER in case of any new symptoms or acute sickness. Patient is agreeable to this plan.  Dr. Rogue Bussing was available for consultation and review of plan of care for this patient.   Evlyn Kanner, NP   01/03/2016 3:18 PM

## 2016-01-04 ENCOUNTER — Other Ambulatory Visit: Payer: Self-pay | Admitting: Family Medicine

## 2016-01-04 ENCOUNTER — Other Ambulatory Visit: Payer: Self-pay | Admitting: Internal Medicine

## 2016-01-04 ENCOUNTER — Telehealth: Payer: Self-pay

## 2016-01-04 ENCOUNTER — Encounter: Payer: Self-pay | Admitting: Family Medicine

## 2016-01-04 DIAGNOSIS — C20 Malignant neoplasm of rectum: Secondary | ICD-10-CM

## 2016-01-04 LAB — CEA: CEA: 1.8 ng/mL (ref 0.0–4.7)

## 2016-01-04 NOTE — Telephone Encounter (Signed)
Left vm on pt's home number and cell to schedule colonoscopy.

## 2016-01-08 ENCOUNTER — Ambulatory Visit: Payer: Self-pay | Attending: Internal Medicine | Admitting: *Deleted

## 2016-01-08 ENCOUNTER — Other Ambulatory Visit: Payer: Self-pay | Admitting: *Deleted

## 2016-01-08 ENCOUNTER — Inpatient Hospital Stay
Admission: RE | Admit: 2016-01-08 | Discharge: 2016-01-08 | Disposition: A | Discharge status: Home / Self Care | Payer: Self-pay | Source: Ambulatory Visit | Attending: *Deleted | Admitting: *Deleted

## 2016-01-08 ENCOUNTER — Encounter: Payer: Self-pay | Admitting: *Deleted

## 2016-01-08 VITALS — BP 145/98 | HR 73 | Temp 99.2°F | Ht 65.35 in | Wt 209.9 lb

## 2016-01-08 DIAGNOSIS — Z9289 Personal history of other medical treatment: Secondary | ICD-10-CM

## 2016-01-08 DIAGNOSIS — N63 Unspecified lump in unspecified breast: Secondary | ICD-10-CM

## 2016-01-08 NOTE — Patient Instructions (Signed)
Gave patient hand-out, Women Staying Healthy, Active and Well from [facility], with education on breast health, pap smears, heart and colon health. 

## 2016-01-08 NOTE — Progress Notes (Signed)
Subjective:     Patient ID: Tammie Robinson, female   DOB: [birth year], 50 y.o.   MRN: [id number]  HPI   Review of Systems     Objective:   Physical Exam  Pulmonary/Chest: Right breast exhibits mass. Right breast exhibits no inverted nipple, no nipple discharge, no skin change and no tenderness. Left breast exhibits no inverted nipple, no mass, no nipple discharge, no skin change and no tenderness. Breasts are asymmetrical.         Assessment:     50 year old White female referred to [place] by Georgeanne Nim, NP for clinical breast exam and mammogram.  Patient with history of rectal cancer in 2015.  She was treated with chemotherapy and radiation therapy.  On physical exam today the patient is slightly febrile at 99.2.  Denies sore throat, cough or sinus problems.  Enlarger cervical lymph nodes noted bilaterally.  There is no evidence of reddress or drainage in the throat.  Right breast is 2 times larger than the left breast.  Patient states this is normal for her.  I can palpate an approximate 4 cm thickening at the right breast from 9-1:00.  The right breast is much fuller than the left with such asymmetry that I am unsure if this is a normal finding since there is such a significant difference in the size of bilateral breast.  Taught self breast awareness.  Patient has been screened for eligibility.  She does not have any insurance, Medicare or Medicaid.  She also meets financial eligibility.  Hand-out given on the Affordable Care Act.    Plan:     Will get bilateral diagnostic mammogram and ultrasound.  Encouraged patient to call or report to her primary care provider if she develops any fever greater than 101.5, has chills, or develops a cough or sore throat.  She is agreeable.  Will follow-up per B[facility]CP protocol.

## 2016-01-09 ENCOUNTER — Other Ambulatory Visit: Payer: Self-pay

## 2016-01-09 MED ORDER — PEG 3350-KCL-NA BICARB-NACL 420 G PO SOLR: 4000.0000 mL | ORAL | Status: DC

## 2016-01-09 NOTE — Telephone Encounter (Signed)
Gastroenterology Pre-Procedure Review  Request Date: 01/26/16 Requesting Physician: Dr. Ronette Deter  PATIENT REVIEW QUESTIONS: The patient responded to the following health history questions as indicated:    1. Are you having any GI issues? yes (Colostomy bag) previous hx of rectal cancer 2. Do you have a personal history of Polyps? yes (Rectal cancer) 3. Do you have a family history of Colon Cancer or Polyps? no 4. Diabetes Mellitus? no 5. Joint replacements in the past 12 months?no 6. Major health problems in the past 3 months?no 7. Any artificial heart valves, MVP, or defibrillator?no    MEDICATIONS & ALLERGIES:    Patient reports the following regarding taking any anticoagulation/antiplatelet therapy:   Plavix, Coumadin, Eliquis, Xarelto, Lovenox, Pradaxa, Brilinta, or Effient? no Aspirin? no  Patient confirms/reports the following medications:  Current Outpatient Prescriptions  Medication Sig Dispense Refill  . acetaminophen (TYLENOL) 325 MG tablet Take by mouth.    Marland Kitchen albuterol (PROVENTIL HFA;VENTOLIN HFA) 108 (90 BASE) MCG/ACT inhaler Inhale 1-2 puffs into the lungs every 4 (four) hours as needed for wheezing or shortness of breath. 1 Inhaler 3  . ALPRAZolam (XANAX) 0.25 MG tablet take 1 tablet by mouth three times a day if needed for anxiety 90 tablet 1  . amLODipine (NORVASC) 5 MG tablet Take 1 tablet (5 mg total) by mouth daily. 90 tablet 3  . ferrous sulfate 325 (65 FE) MG tablet Take by mouth 2 (two) times daily with a meal.     . FLUoxetine (PROZAC) 20 MG capsule Take 20 mg by mouth daily.  6  . gabapentin (NEURONTIN) 100 MG capsule take 2 capsules by mouth three times a day 180 capsule 3  . gabapentin (NEURONTIN) 300 MG capsule Take 1 capsule (300 mg total) by mouth 3 (three) times daily. 90 capsule 3  . lidocaine-prilocaine (EMLA) cream   0  . prochlorperazine (COMPAZINE) 10 MG tablet   0   No current facility-administered medications for this visit.    Facility-Administered Medications Ordered in Other Visits  Medication Dose Route Frequency Provider Last Rate Last Dose  . heparin lock flush 100 unit/mL  500 Units Intravenous Once Leia Alf, MD      . sodium chloride 0.9 % injection 10 mL  10 mL Intravenous PRN Leia Alf, MD      . sodium chloride 0.9 % injection 10 mL  10 mL Intravenous PRN Leia Alf, MD   10 mL at 11/28/14 1120  . sodium chloride flush (NS) 0.9 % injection 10 mL  10 mL Intravenous PRN Evlyn Kanner, NP   10 mL at 01/03/16 1413    Patient confirms/reports the following allergies:  Allergies  Allergen Reactions  . Lisinopril Hives and Rash    No orders of the defined types were placed in this encounter.    AUTHORIZATION INFORMATION Primary Insurance: 1D#: Group #:  Secondary Insurance: 1D#: Group #:  SCHEDULE INFORMATION: Date: 01/26/16 Time: Location: [place]

## 2016-01-16 ENCOUNTER — Other Ambulatory Visit: Payer: Self-pay

## 2016-01-16 ENCOUNTER — Ambulatory Visit: Payer: Self-pay

## 2016-01-18 ENCOUNTER — Encounter: Payer: Self-pay | Admitting: *Deleted

## 2016-01-22 ENCOUNTER — Ambulatory Visit
Admission: RE | Admit: 2016-01-22 | Discharge: 2016-01-22 | Disposition: A | Discharge status: Home / Self Care | Payer: Self-pay | Source: Ambulatory Visit | Attending: Internal Medicine | Admitting: Internal Medicine

## 2016-01-22 DIAGNOSIS — N63 Unspecified lump in unspecified breast: Secondary | ICD-10-CM

## 2016-01-24 ENCOUNTER — Telehealth: Payer: Self-pay | Admitting: Gastroenterology

## 2016-01-24 NOTE — Telephone Encounter (Signed)
error 

## 2016-01-25 NOTE — Discharge Instructions (Signed)

## 2016-01-26 ENCOUNTER — Encounter
Admission: RE | Disposition: A | Discharge status: Home / Self Care | Payer: Self-pay | Source: Ambulatory Visit | Attending: Gastroenterology

## 2016-01-26 ENCOUNTER — Ambulatory Visit
Admission: RE | Admit: 2016-01-26 | Discharge: 2016-01-26 | Disposition: A | Discharge status: Home / Self Care | Payer: Self-pay | Source: Ambulatory Visit | Attending: Gastroenterology | Admitting: Gastroenterology

## 2016-01-26 ENCOUNTER — Ambulatory Visit: Payer: Self-pay | Admitting: Anesthesiology

## 2016-01-26 DIAGNOSIS — Z79899 Other long term (current) drug therapy: Secondary | ICD-10-CM | POA: Insufficient documentation

## 2016-01-26 DIAGNOSIS — D120 Benign neoplasm of cecum: Secondary | ICD-10-CM | POA: Insufficient documentation

## 2016-01-26 DIAGNOSIS — G43909 Migraine, unspecified, not intractable, without status migrainosus: Secondary | ICD-10-CM | POA: Insufficient documentation

## 2016-01-26 DIAGNOSIS — Z803 Family history of malignant neoplasm of breast: Secondary | ICD-10-CM | POA: Insufficient documentation

## 2016-01-26 DIAGNOSIS — Z833 Family history of diabetes mellitus: Secondary | ICD-10-CM | POA: Insufficient documentation

## 2016-01-26 DIAGNOSIS — Z85048 Personal history of other malignant neoplasm of rectum, rectosigmoid junction, and anus: Secondary | ICD-10-CM | POA: Insufficient documentation

## 2016-01-26 DIAGNOSIS — Z1211 Encounter for screening for malignant neoplasm of colon: Secondary | ICD-10-CM | POA: Insufficient documentation

## 2016-01-26 DIAGNOSIS — Z8261 Family history of arthritis: Secondary | ICD-10-CM | POA: Insufficient documentation

## 2016-01-26 DIAGNOSIS — Z933 Colostomy status: Secondary | ICD-10-CM | POA: Insufficient documentation

## 2016-01-26 DIAGNOSIS — Z9049 Acquired absence of other specified parts of digestive tract: Secondary | ICD-10-CM | POA: Insufficient documentation

## 2016-01-26 DIAGNOSIS — Z90710 Acquired absence of both cervix and uterus: Secondary | ICD-10-CM | POA: Insufficient documentation

## 2016-01-26 DIAGNOSIS — I10 Essential (primary) hypertension: Secondary | ICD-10-CM | POA: Insufficient documentation

## 2016-01-26 DIAGNOSIS — Z8249 Family history of ischemic heart disease and other diseases of the circulatory system: Secondary | ICD-10-CM | POA: Insufficient documentation

## 2016-01-26 DIAGNOSIS — J45909 Unspecified asthma, uncomplicated: Secondary | ICD-10-CM | POA: Insufficient documentation

## 2016-01-26 DIAGNOSIS — T451X5S Adverse effect of antineoplastic and immunosuppressive drugs, sequela: Secondary | ICD-10-CM | POA: Insufficient documentation

## 2016-01-26 DIAGNOSIS — Z85038 Personal history of other malignant neoplasm of large intestine: Secondary | ICD-10-CM

## 2016-01-26 DIAGNOSIS — Z9221 Personal history of antineoplastic chemotherapy: Secondary | ICD-10-CM | POA: Insufficient documentation

## 2016-01-26 DIAGNOSIS — G620 Drug-induced polyneuropathy: Secondary | ICD-10-CM | POA: Insufficient documentation

## 2016-01-26 DIAGNOSIS — X58XXXS Exposure to other specified factors, sequela: Secondary | ICD-10-CM | POA: Insufficient documentation

## 2016-01-26 DIAGNOSIS — Z811 Family history of alcohol abuse and dependence: Secondary | ICD-10-CM | POA: Insufficient documentation

## 2016-01-26 HISTORY — PX: POLYPECTOMY: SHX5525

## 2016-01-26 HISTORY — DX: Colostomy status: Z93.3

## 2016-01-26 HISTORY — DX: Presence of spectacles and contact lenses: Z97.3

## 2016-01-26 HISTORY — DX: Polyneuropathy, unspecified: G62.9

## 2016-01-26 HISTORY — DX: Anemia, unspecified: D64.9

## 2016-01-26 HISTORY — PX: COLONOSCOPY WITH PROPOFOL: SHX5780

## 2016-01-26 SURGERY — COLONOSCOPY WITH PROPOFOL
Anesthesia: Monitor Anesthesia Care | Wound class: Contaminated

## 2016-01-26 MED ORDER — LACTATED RINGERS IV SOLN
INTRAVENOUS | Status: DC | PRN
  Administered 2016-01-26: 09:00:00 via INTRAVENOUS

## 2016-01-26 MED ORDER — LIDOCAINE HCL (CARDIAC) 20 MG/ML IV SOLN
INTRAVENOUS | Status: DC | PRN
  Administered 2016-01-26: 50 mg via INTRAVENOUS

## 2016-01-26 MED ORDER — STERILE WATER FOR IRRIGATION IR SOLN
Status: DC | PRN
  Administered 2016-01-26: 09:00:00

## 2016-01-26 MED ORDER — PROPOFOL 10 MG/ML IV BOLUS
INTRAVENOUS | Status: DC | PRN
  Administered 2016-01-26: 30 mg via INTRAVENOUS
  Administered 2016-01-26 (×2): 20 mg via INTRAVENOUS
  Administered 2016-01-26: 70 mg via INTRAVENOUS
  Administered 2016-01-26: 20 mg via INTRAVENOUS
  Administered 2016-01-26: 10 mg via INTRAVENOUS
  Administered 2016-01-26 (×2): 20 mg via INTRAVENOUS
  Administered 2016-01-26: 10 mg via INTRAVENOUS

## 2016-01-26 SURGICAL SUPPLY — 23 items
CANISTER SUCT 1200ML W/VALVE (MISCELLANEOUS) ×4 IMPLANT
CLIP HMST 235XBRD CATH ROT (MISCELLANEOUS) IMPLANT
CLIP RESOLUTION 360 11X235 (MISCELLANEOUS)
FCP ESCP3.2XJMB 240X2.8X (MISCELLANEOUS)
FORCEPS BIOP RAD 4 LRG CAP 4 (CUTTING FORCEPS) ×2 IMPLANT
FORCEPS BIOP RJ4 240 W/NDL (MISCELLANEOUS)
FORCEPS ESCP3.2XJMB 240X2.8X (MISCELLANEOUS) IMPLANT
GOWN CVR UNV OPN BCK APRN NK (MISCELLANEOUS) ×4 IMPLANT
GOWN ISOL THUMB LOOP REG UNIV (MISCELLANEOUS) ×8
INJECTOR VARIJECT VIN23 (MISCELLANEOUS) IMPLANT
KIT DEFENDO VALVE AND CONN (KITS) IMPLANT
KIT ENDO PROCEDURE OLY (KITS) ×4 IMPLANT
MARKER SPOT ENDO TATTOO 5ML (MISCELLANEOUS) IMPLANT
PAD GROUND ADULT SPLIT (MISCELLANEOUS) IMPLANT
PROBE APC STR FIRE (PROBE) IMPLANT
RETRIEVER NET ROTH 2.5X230 LF (MISCELLANEOUS) ×4 IMPLANT
SNARE SHORT THROW 13M SML OVAL (MISCELLANEOUS) ×2 IMPLANT
SNARE SHORT THROW 30M LRG OVAL (MISCELLANEOUS) IMPLANT
SNARE SNG USE RND 15MM (INSTRUMENTS) IMPLANT
SPOT EX ENDOSCOPIC TATTOO (MISCELLANEOUS)
TRAP ETRAP POLY (MISCELLANEOUS) ×2 IMPLANT
VARIJECT INJECTOR VIN23 (MISCELLANEOUS)
WATER STERILE IRR 250ML POUR (IV SOLUTION) ×4 IMPLANT

## 2016-01-26 NOTE — Anesthesia Preprocedure Evaluation (Signed)
Anesthesia Evaluation  Patient identified by MRN, date of birth, ID band Patient awake    Airway Mallampati: II  TM Distance: >3 FB Neck ROM: Full    Dental   Pulmonary asthma ,    Pulmonary exam normal        Cardiovascular hypertension, Normal cardiovascular exam     Neuro/Psych  Headaches,    GI/Hepatic   Endo/Other    Renal/GU      Musculoskeletal   Abdominal   Peds  Hematology   Anesthesia Other Findings Rectal CA - with port. Treatments finished  Reproductive/Obstetrics                             Anesthesia Physical Anesthesia Plan  ASA: III  Anesthesia Plan: MAC   Post-op Pain Management:    Induction: Intravenous  Airway Management Planned:   Additional Equipment:   Intra-op Plan:   Post-operative Plan:   Informed Consent: I have reviewed the patients History and Physical, chart, labs and discussed the procedure including the risks, benefits and alternatives for the proposed anesthesia with the patient or authorized representative who has indicated his/her understanding and acceptance.     Plan Discussed with: CRNA  Anesthesia Plan Comments:         Anesthesia Quick Evaluation

## 2016-01-26 NOTE — Anesthesia Procedure Notes (Signed)
Procedure Name: MAC Performed by: Sophea Rackham Pre-anesthesia Checklist: Patient identified, Emergency Drugs available, Suction available, Timeout performed and Patient being monitored Patient Re-evaluated:Patient Re-evaluated prior to inductionOxygen Delivery Method: Nasal cannula Placement Confirmation: positive ETCO2       

## 2016-01-26 NOTE — H&P (Signed)
Lucilla Lame, MD [address] [place], [place] [address] Phone: [phone] Fax : [phone]  Primary Care Physician:  Rica Mast, MD Primary Gastroenterologist:  Dr. Allen Aslinger  Pre-Procedure History & Physical: HPI:  Tammie Robinson is a 50 y.o. female is here for an colonoscopy.   Past Medical History  Diagnosis Date  . Blood in stool   . History of ERCP   . Rectal cancer ([place])   . Asthma     seasonal,never hospitalized  . Hypertension   . Anemia     prior to CA dx  . Migraine   . 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 Colostomy in place [facility])   . Wears contact lenses     Past Surgical History  Procedure Laterality Date  . Cholecystectomy  2012    Dr. Jamal Collin  . Vaginal delivery      2, preterm labor  . Abdominal hysterectomy  04/12/14    [facility] with CA surgery  . Salpingoophorectomy Bilateral 04/12/14    [facility] - with CA surg  . Vagina reconstruction surgery  04/12/14    [facility] - transabdonimal rectus abdominus muscle reconstruction  . Proctectomy  04/12/14    [facility] - CA surgery - with colostomy  . Portacath placement      Prior to Admission medications   Medication Sig Start Date End Date Taking? Authorizing Provider  acetaminophen (TYLENOL) 325 MG tablet Take by mouth.   Yes Historical Provider, MD  albuterol (PROVENTIL HFA;VENTOLIN HFA) 108 (90 BASE) MCG/ACT inhaler Inhale 1-2 puffs into the lungs every 4 (four) hours as needed for wheezing or shortness of breath. 05/30/15  Yes Jackolyn Confer, MD  ALPRAZolam Duanne Moron) 0.25 MG tablet take 1 tablet by mouth three times a day if needed for anxiety 10/30/15  Yes Jackolyn Confer, MD  amLODipine (NORVASC) 5 MG tablet Take 1 tablet (5 mg total) by mouth daily. 05/01/15  Yes Jackolyn Confer, MD  ferrous sulfate 325 (65 FE) MG tablet Take by mouth 2 (two) times daily with a meal.  02/08/14  Yes Historical Provider, MD  FLUoxetine (PROZAC) 20 MG capsule Take 20 mg by mouth daily. 10/30/15   Yes Historical Provider, MD  gabapentin (NEURONTIN) 300 MG capsule Take 1 capsule (300 mg total) by mouth 3 (three) times daily. 01/03/16  Yes Evlyn Kanner, NP  lidocaine-prilocaine (EMLA) cream  11/15/14  Yes Historical Provider, MD  polyethylene glycol-electrolytes (TRILYTE) 420 g solution Take 4,000 mLs by mouth as directed. Drink one 8 oz glass every 20 mins until stools are clear. 01/09/16  Yes Lucilla Lame, MD  prochlorperazine (COMPAZINE) 10 MG tablet  11/15/14  Yes Historical Provider, MD    Allergies as of 01/09/2016 - Review Complete 01/08/2016  Allergen Reaction Noted  . Lisinopril Hives and Rash 04/14/2012    Family History  Problem Relation Age of Onset  . Arthritis Mother   . Stroke Mother   . Hypertension Mother   . Diabetes Mother   . Arthritis Father   . Heart disease Father 95    s/p CABG  . Stroke Father   . Hypertension Father   . Alcohol abuse Maternal Grandmother   . Diabetes Maternal Grandmother   . Alcohol abuse Maternal Grandfather   . Diabetes Maternal Grandfather   . Cancer Paternal Grandmother     breast  . Heart disease Paternal Grandfather   . Breast cancer Paternal Aunt 33  Social History   Social History  . Marital Status: Married    Spouse Name: N/A  . Number of Children: N/A  . Years of Education: N/A   Occupational History  . Not on file.   Social History Main Topics  . Smoking status: Never Smoker   . Smokeless tobacco: Not on file  . Alcohol Use: No     Comment: rare - holidays  . Drug Use: No  . Sexual Activity: Not on file   Other Topics Concern  . Not on file   Social History Narrative   Lives in [place] with husband, has 2 children, 1 in college at [employer].      Work - Chiropractor   Diet - regular   Exercise - none recently    Review of Systems: See HPI, otherwise negative ROS  Physical Exam: BP 139/84 mmHg  Pulse 65  Temp(Src) 97.3 F (36.3 C) (Temporal)  Resp 16  Ht 5\' 5"  (1.651 m)  Wt 211 lb  (95.709 kg)  BMI 35.11 kg/m2  SpO2 99%  LMP 12/11/2013 General:   Alert,  pleasant and cooperative in NAD Head:  Normocephalic and atraumatic. Neck:  Supple; no masses or thyromegaly. Lungs:  Clear throughout to auscultation.    Heart:  Regular rate and rhythm. Abdomen:  Soft, nontender and nondistended. Normal bowel sounds, without guarding, and without rebound.   Neurologic:  Alert and  oriented x4;  grossly normal neurologically.  Impression/Plan: Tammie Robinson is here for an colonoscopy to be performed for colon cancer  Risks, benefits, limitations, and alternatives regarding  colonoscopy have been reviewed with the patient.  Questions have been answered.  All parties agreeable.   Lucilla Lame, MD  01/26/2016, 8:34 AM

## 2016-01-26 NOTE — Op Note (Signed)
[facility] Patient Name: Tammie Robinson Procedure Date: 01/26/2016 8:37 AM MRN: [id number] Account #: [id number] Date of Birth: [birth year] Admit Type: Outpatient Age: 50 Room: [facility] OR ROOM 01 Gender: Female Note Status: Finalized Procedure:            Colonoscopy Indications:          High risk colon cancer surveillance: Personal history                        of colon cancer Providers:            Lucilla Lame, MD Referring MD:         Eduard Clos. Gilford Rile, MD (Referring MD) Medicines:            Propofol per Anesthesia Complications:        No immediate complications. Procedure:            Pre-Anesthesia Assessment:                       - Prior to the procedure, a History and Physical was                        performed, and patient medications and allergies were                        reviewed. The patient's tolerance of previous                        anesthesia was also reviewed. The risks and benefits of                        the procedure and the sedation options and risks were                        discussed with the patient. All questions were                        answered, and informed consent was obtained. Prior                        Anticoagulants: The patient has taken no previous                        anticoagulant or antiplatelet agents. ASA Grade                        Assessment: II - A patient with mild systemic disease.                        After reviewing the risks and benefits, the patient was                        deemed in satisfactory condition to undergo the                        procedure.                       After obtaining informed consent, the colonoscope was  passed under direct vision. Throughout the procedure,                        the patient's blood pressure, pulse, and oxygen                        saturations were monitored continuously. The Olympus CF   H180AL colonoscope (S#: [id number]) was introduced through                        the sigmoid colostomy and advanced to the the cecum,                        identified by appendiceal orifice and ileocecal valve.                        The colonoscopy was performed without difficulty. The                        patient tolerated the procedure well. The quality of                        the bowel preparation was excellent. Findings:      The perianal and digital rectal examinations were normal.      A 7 mm polyp was found in the cecum. The polyp was sessile. The polyp       was removed with a cold snare. Resection and retrieval were complete.      An area of mildly congested mucosa was found at the ileocecal valve.       This was biopsied with a cold forceps for histology. Impression:           - One 7 mm polyp in the cecum, removed with a cold                        snare. Resected and retrieved.                       - Congested mucosa at the ileocecal valve. Biopsied. Recommendation:       - Await pathology results.                       - If IC valve adenomatous then resection would be                        needed.                       - Repeat colonoscopy in 3 years for surveillance. Procedure Code(s):    --- Professional ---                       [phone], Colonoscopy through stoma; with removal of                        tumor(s), polyp(s), or other lesion(s) by snare                        technique                       [id number],  51, Colonoscopy through stoma; with biopsy,                        single or multiple Diagnosis Code(s):    --- Professional ---                       [id number], Personal history of other malignant neoplasm                        of large intestine                       D12.0, Benign neoplasm of cecum CPT copyright 2016 American Medical Association. All rights reserved. The codes documented in this report are preliminary and upon coder review may  be revised to  meet current compliance requirements. Lucilla Lame, MD 01/26/2016 9:03:18 AM This report has been signed electronically. Number of Addenda: 0 Note Initiated On: 01/26/2016 8:37 AM Scope Withdrawal Time: 0 hours 8 minutes 37 seconds  Total Procedure Duration: 0 hours 11 minutes 39 seconds       [facility]

## 2016-01-26 NOTE — Anesthesia Postprocedure Evaluation (Signed)
Anesthesia Post Note  Patient: Tammie Robinson  Procedure(s) Performed: Procedure(s) (LRB): COLONOSCOPY WITH PROPOFOL through colostomy (N/A) POLYPECTOMY  Patient location during evaluation: PACU Anesthesia Type: General Level of consciousness: awake and alert Pain management: pain level controlled Vital Signs Assessment: post-procedure vital signs reviewed and stable Respiratory status: spontaneous breathing, nonlabored ventilation, respiratory function stable and patient connected to nasal cannula oxygen Cardiovascular status: blood pressure returned to baseline and stable Postop Assessment: no signs of nausea or vomiting Anesthetic complications: no    Marshell Levan

## 2016-01-26 NOTE — Transfer of Care (Signed)
Immediate Anesthesia Transfer of Care Note  Patient: Tammie Robinson  Procedure(s) Performed: Procedure(s) with comments: COLONOSCOPY WITH PROPOFOL through colostomy (N/A) - port-a-cath POLYPECTOMY  Patient Location: PACU  Anesthesia Type: MAC  Level of Consciousness: awake, alert  and patient cooperative  Airway and Oxygen Therapy: Patient Spontanous Breathing and Patient connected to supplemental oxygen  Post-op Assessment: Post-op Vital signs reviewed, Patient's Cardiovascular Status Stable, Respiratory Function Stable, Patent Airway and No signs of Nausea or vomiting  Post-op Vital Signs: Reviewed and stable  Complications: No apparent anesthesia complications

## 2016-01-29 ENCOUNTER — Encounter: Payer: Self-pay | Admitting: Gastroenterology

## 2016-01-30 ENCOUNTER — Encounter: Payer: Self-pay | Admitting: Gastroenterology

## 2016-02-01 ENCOUNTER — Encounter: Payer: Self-pay | Admitting: *Deleted

## 2016-02-01 NOTE — Progress Notes (Signed)
Letter mailed from the [place] to inform patient of her normal mammogram results.  Since the clinical findings do not warrant further evaluation patient is to follow-up with annual screening in one year.  HSIS to [place].

## 2016-02-13 ENCOUNTER — Inpatient Hospital Stay: Payer: Self-pay | Attending: Internal Medicine

## 2016-02-13 DIAGNOSIS — Z452 Encounter for adjustment and management of vascular access device: Secondary | ICD-10-CM | POA: Insufficient documentation

## 2016-02-13 DIAGNOSIS — C20 Malignant neoplasm of rectum: Secondary | ICD-10-CM | POA: Insufficient documentation

## 2016-02-13 DIAGNOSIS — Z95828 Presence of other vascular implants and grafts: Secondary | ICD-10-CM

## 2016-02-13 MED ORDER — SODIUM CHLORIDE 0.9% FLUSH
10.0000 mL | INTRAVENOUS | Status: DC | PRN
  Administered 2016-02-13: 10 mL via INTRAVENOUS
  Filled 2016-02-13: qty 10

## 2016-02-13 MED ORDER — HEPARIN SOD (PORK) LOCK FLUSH 100 UNIT/ML IV SOLN
500.0000 [IU] | Freq: Once | INTRAVENOUS | Status: AC
  Administered 2016-02-13: 500 [IU] via INTRAVENOUS

## 2016-02-23 ENCOUNTER — Other Ambulatory Visit: Payer: Self-pay | Admitting: Internal Medicine

## 2016-02-23 NOTE — Telephone Encounter (Signed)
Please advise on refill. Last filled 02/23/2016. Looks like pharmacy is requesting refills for future. Last approved 10/30/15 quant 90 with 1 refill.  Pt last seen 10/30/15 by Dr. Gilford Rile. No follow up visit on file.

## 2016-02-25 NOTE — Telephone Encounter (Signed)
Please call patient:    Pharmacy ( not patient) contacted [place] for refill.   Can patient make f/u appt with me to discuss anxiety and we can do refill then??   Refused refill request for now until you speak with patient.

## 2016-02-26 NOTE — Telephone Encounter (Signed)
My chart message has been sent to patient.

## 2016-03-29 ENCOUNTER — Inpatient Hospital Stay: Payer: Self-pay | Attending: Internal Medicine

## 2016-03-29 DIAGNOSIS — Z85048 Personal history of other malignant neoplasm of rectum, rectosigmoid junction, and anus: Secondary | ICD-10-CM | POA: Insufficient documentation

## 2016-03-29 DIAGNOSIS — C20 Malignant neoplasm of rectum: Secondary | ICD-10-CM

## 2016-03-29 DIAGNOSIS — Z452 Encounter for adjustment and management of vascular access device: Secondary | ICD-10-CM | POA: Insufficient documentation

## 2016-03-29 MED ORDER — HEPARIN SOD (PORK) LOCK FLUSH 100 UNIT/ML IV SOLN
INTRAVENOUS | Status: AC
  Filled 2016-03-29: qty 5

## 2016-03-29 MED ORDER — SODIUM CHLORIDE 0.9% FLUSH
10.0000 mL | Freq: Once | INTRAVENOUS | Status: AC
  Administered 2016-03-29: 10 mL via INTRAVENOUS
  Filled 2016-03-29: qty 10

## 2016-03-29 MED ORDER — HEPARIN SOD (PORK) LOCK FLUSH 100 UNIT/ML IV SOLN
500.0000 [IU] | Freq: Once | INTRAVENOUS | Status: AC
  Administered 2016-03-29: 500 [IU] via INTRAVENOUS

## 2016-04-26 ENCOUNTER — Other Ambulatory Visit: Payer: Self-pay | Admitting: *Deleted

## 2016-05-22 ENCOUNTER — Other Ambulatory Visit: Payer: Self-pay | Admitting: *Deleted

## 2016-05-22 MED ORDER — GABAPENTIN 300 MG PO CAPS: 300.0000 mg | ORAL_CAPSULE | Freq: Three times a day (TID) | ORAL | 1 refills | Status: DC

## 2016-05-24 ENCOUNTER — Inpatient Hospital Stay: Payer: Self-pay

## 2016-05-30 ENCOUNTER — Inpatient Hospital Stay: Payer: Self-pay | Attending: Internal Medicine

## 2016-05-30 DIAGNOSIS — Z452 Encounter for adjustment and management of vascular access device: Secondary | ICD-10-CM | POA: Insufficient documentation

## 2016-05-30 DIAGNOSIS — Z95828 Presence of other vascular implants and grafts: Secondary | ICD-10-CM

## 2016-05-30 DIAGNOSIS — Z85048 Personal history of other malignant neoplasm of rectum, rectosigmoid junction, and anus: Secondary | ICD-10-CM | POA: Insufficient documentation

## 2016-05-30 MED ORDER — HEPARIN SOD (PORK) LOCK FLUSH 100 UNIT/ML IV SOLN
500.0000 [IU] | Freq: Once | INTRAVENOUS | Status: AC
  Administered 2016-05-30: 500 [IU] via INTRAVENOUS

## 2016-05-30 MED ORDER — SODIUM CHLORIDE 0.9% FLUSH
10.0000 mL | INTRAVENOUS | Status: DC | PRN
  Administered 2016-05-30: 10 mL via INTRAVENOUS
  Filled 2016-05-30: qty 10

## 2016-06-11 ENCOUNTER — Telehealth: Payer: Self-pay | Admitting: Internal Medicine

## 2016-06-11 MED ORDER — AMLODIPINE BESYLATE 5 MG PO TABS: 5.0000 mg | ORAL_TABLET | Freq: Every day | ORAL | 0 refills | Status: DC

## 2016-06-11 NOTE — Telephone Encounter (Signed)
Sent 90 day supply with no refills, needs appt scheduled.

## 2016-06-11 NOTE — Telephone Encounter (Signed)
Pt scheduled for 1/4 with m. Arnett

## 2016-06-11 NOTE — Telephone Encounter (Signed)
Pt called requesting a refill on her amLODipine (NORVASC) 5 MG tablet. She currently does not have insurance and is scheduling an appt for after the first of the year. Please advise, thank you!  [place], [place]  Call pt @ [phone]

## 2016-06-11 NOTE — Telephone Encounter (Signed)
Noted thanks °

## 2016-06-28 ENCOUNTER — Other Ambulatory Visit: Payer: Self-pay | Admitting: Internal Medicine

## 2016-07-04 ENCOUNTER — Inpatient Hospital Stay: Payer: Self-pay | Attending: Internal Medicine

## 2016-07-04 ENCOUNTER — Inpatient Hospital Stay (HOSPITAL_BASED_OUTPATIENT_CLINIC_OR_DEPARTMENT_OTHER): Payer: Self-pay | Admitting: Internal Medicine

## 2016-07-04 ENCOUNTER — Inpatient Hospital Stay: Payer: Self-pay

## 2016-07-04 ENCOUNTER — Encounter: Payer: Self-pay | Admitting: Internal Medicine

## 2016-07-04 DIAGNOSIS — Z803 Family history of malignant neoplasm of breast: Secondary | ICD-10-CM

## 2016-07-04 DIAGNOSIS — Z85048 Personal history of other malignant neoplasm of rectum, rectosigmoid junction, and anus: Secondary | ICD-10-CM

## 2016-07-04 DIAGNOSIS — Z79899 Other long term (current) drug therapy: Secondary | ICD-10-CM | POA: Insufficient documentation

## 2016-07-04 DIAGNOSIS — Z8669 Personal history of other diseases of the nervous system and sense organs: Secondary | ICD-10-CM | POA: Insufficient documentation

## 2016-07-04 DIAGNOSIS — I10 Essential (primary) hypertension: Secondary | ICD-10-CM

## 2016-07-04 DIAGNOSIS — C20 Malignant neoplasm of rectum: Secondary | ICD-10-CM

## 2016-07-04 DIAGNOSIS — G629 Polyneuropathy, unspecified: Secondary | ICD-10-CM

## 2016-07-04 DIAGNOSIS — Z90710 Acquired absence of both cervix and uterus: Secondary | ICD-10-CM

## 2016-07-04 DIAGNOSIS — Z452 Encounter for adjustment and management of vascular access device: Secondary | ICD-10-CM | POA: Insufficient documentation

## 2016-07-04 DIAGNOSIS — F4310 Post-traumatic stress disorder, unspecified: Secondary | ICD-10-CM

## 2016-07-04 DIAGNOSIS — J45909 Unspecified asthma, uncomplicated: Secondary | ICD-10-CM

## 2016-07-04 DIAGNOSIS — Z90722 Acquired absence of ovaries, bilateral: Secondary | ICD-10-CM

## 2016-07-04 DIAGNOSIS — D696 Thrombocytopenia, unspecified: Secondary | ICD-10-CM | POA: Insufficient documentation

## 2016-07-04 DIAGNOSIS — Z95828 Presence of other vascular implants and grafts: Secondary | ICD-10-CM

## 2016-07-04 DIAGNOSIS — Z933 Colostomy status: Secondary | ICD-10-CM | POA: Insufficient documentation

## 2016-07-04 DIAGNOSIS — Z9221 Personal history of antineoplastic chemotherapy: Secondary | ICD-10-CM | POA: Insufficient documentation

## 2016-07-04 LAB — COMPREHENSIVE METABOLIC PANEL
ALK PHOS: 96 U/L (ref 38–126)
ALT: 27 U/L (ref 14–54)
ANION GAP: 8 (ref 5–15)
AST: 28 U/L (ref 15–41)
Albumin: 4.1 g/dL (ref 3.5–5.0)
BUN: 14 mg/dL (ref 6–20)
CALCIUM: 8.6 mg/dL — AB (ref 8.9–10.3)
CHLORIDE: 105 mmol/L (ref 101–111)
CO2: 25 mmol/L (ref 22–32)
CREATININE: 0.7 mg/dL (ref 0.44–1.00)
Glucose, Bld: 101 mg/dL — ABNORMAL HIGH (ref 65–99)
Potassium: 3.6 mmol/L (ref 3.5–5.1)
Sodium: 138 mmol/L (ref 135–145)
Total Bilirubin: 0.7 mg/dL (ref 0.3–1.2)
Total Protein: 7.2 g/dL (ref 6.5–8.1)

## 2016-07-04 LAB — CBC WITH DIFFERENTIAL/PLATELET
Basophils Absolute: 0 10*3/uL (ref 0–0.1)
Basophils Relative: 1 %
EOS ABS: 0.2 10*3/uL (ref 0–0.7)
EOS PCT: 4 %
HCT: 39 % (ref 35.0–47.0)
Hemoglobin: 13.1 g/dL (ref 12.0–16.0)
LYMPHS ABS: 0.8 10*3/uL — AB (ref 1.0–3.6)
LYMPHS PCT: 20 %
MCH: 28.2 pg (ref 26.0–34.0)
MCHC: 33.5 g/dL (ref 32.0–36.0)
MCV: 84.1 fL (ref 80.0–100.0)
MONOS PCT: 9 %
Monocytes Absolute: 0.3 10*3/uL (ref 0.2–0.9)
Neutro Abs: 2.5 10*3/uL (ref 1.4–6.5)
Neutrophils Relative %: 66 %
PLATELETS: 144 10*3/uL — AB (ref 150–440)
RBC: 4.64 MIL/uL (ref 3.80–5.20)
RDW: 13.8 % (ref 11.5–14.5)
WBC: 3.8 10*3/uL (ref 3.6–11.0)

## 2016-07-04 MED ORDER — HEPARIN SOD (PORK) LOCK FLUSH 100 UNIT/ML IV SOLN
500.0000 [IU] | Freq: Once | INTRAVENOUS | Status: AC
  Administered 2016-07-04: 500 [IU] via INTRAVENOUS

## 2016-07-04 MED ORDER — GABAPENTIN 300 MG PO CAPS: ORAL_CAPSULE | ORAL | 6 refills | Status: DC

## 2016-07-04 MED ORDER — SODIUM CHLORIDE 0.9% FLUSH
10.0000 mL | INTRAVENOUS | Status: DC | PRN
  Administered 2016-07-04: 10 mL via INTRAVENOUS
  Filled 2016-07-04: qty 10

## 2016-07-04 NOTE — Assessment & Plan Note (Addendum)
1. Rectal cancer, clinical TXN2M0, at least stage III [2015]. Clinically NED. Recent colonoscopy June 2017 unremarkable. Will schedule CT abdomen/pelvis and chext Xray for surveillance ASAP. CEA pending.  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 Platelet count still low but mild and no bleeding issues. Continue to monitor.  # daughter- ALL/ no family Hx- genetic; we'll check pathology for MMR.   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 Increase dose of neurontin [300 mg 3 times a day; one extra pill at nighttime; and if not better add cymbalta.   # port explantation if CT okay  # plan CT asap; follow up in 6 months-labs;CEA. Plan scan again in 1 year.   # 25 minutes face-to-face with the patient discussing the above plan of care; more than 50% of time spent on prognosis/ natural history; counseling and coordination.

## 2016-07-04 NOTE — Progress Notes (Signed)
Pt doing well. She is concerned about her wt because when she finished chemo she had no taste, she had big surgery and lost a lot of wt and sweet foods were the only thing that she could taste.  Over time she has stopped having that craving. Her knees hurts sometimes. She is drinking water only with occ. Tea. off all soda now.  She is eating and drinking good, no problems eating and drinking.  She still has issues with neuropathy esp. At night and it is her hands and feet.

## 2016-07-04 NOTE — Progress Notes (Signed)
[place] OFFICE PROGRESS NOTE  Patient Care Team: Jackolyn Confer, MD as PCP - General (Internal Medicine) Rico Junker, RN as Registered Nurse Theodore Demark, RN as Registered Nurse  Rectal cancer [facility])   Staging form: Colon and Rectum, AJCC 7th Edition   - Clinical stage from 11/21/2014: Stage Unknown ([place], N2, M0) - Signed by Leia Alf, MD on 11/21/2014   Oncology History   # 2015- RECTAL CA STAGE III-  s/p neoadjuvant radiation and concurrent 5FU chemotherapy completed end of July/early Aug 2015.  (initially diagnosed on 12/22/13 - upper GI showed an ulcer and colonoscopy showed a large rectal mass at the most proximal extent 4 cm from the anus). Then had Abdominoperineal resection, radical hysterectomy, vaginectomy with removal of tubes/ovaries with rectus flap myocutaneous vaginal reconstruction at [facility] on 04/12/14. Surgical pathology reports residual invasive rectal adenocarcinoma low-grade, tumor invades muscularis propria, margins uninvolved, no lymphovascular or perineural invasion, 27 lymph nodes examined negative for metastasis (ypT2pN0). 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   # June 2017- colo Dr.Wohl     Rectal cancer ([place])   01/03/2014 Initial Diagnosis    Rectal cancer [facility])       Oncology History   # 2015- RECTAL CA STAGE III-  s/p neoadjuvant radiation and concurrent 5FU chemotherapy completed end of July/early Aug 2015.  (initially diagnosed on 12/22/13 - upper GI showed an ulcer and colonoscopy showed a large rectal mass at the most proximal extent 4 cm from the anus). Then had Abdominoperineal resection, radical hysterectomy, vaginectomy with removal of tubes/ovaries with rectus flap myocutaneous vaginal reconstruction at [facility] on 04/12/14. Surgical pathology reports residual invasive rectal adenocarcinoma low-grade, tumor invades muscularis propria, margins uninvolved, no lymphovascular  or perineural invasion, 27 lymph nodes examined negative for metastasis (ypT2pN0). 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   # June 2017- colo Dr.Wohl     Rectal cancer ([place])   01/03/2014 Initial Diagnosis    Rectal cancer ([place])     This is my first interaction with the patient as patient's primary oncologist has been [place] . I reviewed the patient's prior charts/pertinent labs/imaging in detail; findings are summarized above.     INTERVAL HISTORY:  Tammie Robinson 50 y.o.  female pleasant patient above history of rectal cancer stage III- 2016 status post APR- is currently here for follow-up with her husband.  Patient had a recent colonoscopy in June 2017- unremarkable as per patient. Patient continues to have tingling and numbness of her feet. Continues to have difficulty sleeping at night because of tingling and numbness. This is not interrupting her daily life otherwise. No abdominal pain. No blood in stools no constipation or diarrhea.  REVIEW OF SYSTEMS:  A complete 10 point review of system is done which is negative except mentioned above/history of present illness.   PAST MEDICAL HISTORY :  Past Medical History:  Diagnosis Date  . Anemia    prior to CA dx  . Asthma    seasonal,never hospitalized  . Blood in stool   . Colostomy in place [facility])   . History of ERCP   . Hypertension   . Migraine   . Neuropathy ([place])    feet and hands due to chemo  . PTSD (post-traumatic stress disorder)   . Rectal cancer ([place])   . Wears contact lenses     PAST SURGICAL HISTORY :   Past Surgical History:  Procedure Laterality  Date  . ABDOMINAL HYSTERECTOMY  04/12/14   [facility] with CA surgery  . CHOLECYSTECTOMY  2012   Dr. Jamal Collin  . COLONOSCOPY WITH PROPOFOL N/A 01/26/2016   Procedure: COLONOSCOPY WITH PROPOFOL through colostomy;  Surgeon: Lucilla Lame, MD;  Location: [place];  Service: Endoscopy;  Laterality: N/A;  port-a-cath  . POLYPECTOMY  01/26/2016    Procedure: POLYPECTOMY;  Surgeon: Lucilla Lame, MD;  Location: [place];  Service: Endoscopy;;  . PORTACATH PLACEMENT    . PROCTECTOMY  04/12/14   [facility] - CA surgery - with colostomy  . SALPINGOOPHORECTOMY Bilateral 04/12/14   [facility] - with CA surg  . VAGINA RECONSTRUCTION SURGERY  04/12/14   [facility] - transabdonimal rectus abdominus muscle reconstruction  . VAGINAL DELIVERY     2, preterm labor    FAMILY HISTORY :   Family History  Problem Relation Age of Onset  . Arthritis Mother   . Stroke Mother   . Hypertension Mother   . Diabetes Mother   . Arthritis Father   . Heart disease Father 74    s/p CABG  . Stroke Father   . Hypertension Father   . Alcohol abuse Maternal Grandmother   . Diabetes Maternal Grandmother   . Alcohol abuse Maternal Grandfather   . Diabetes Maternal Grandfather   . Cancer Paternal Grandmother     breast  . Heart disease Paternal Grandfather   . Breast cancer Paternal Aunt 75    SOCIAL HISTORY:   Social History  Substance Use Topics  . Smoking status: Never Smoker  . Smokeless tobacco: Never Used  . Alcohol use No     Comment: rare - holidays    ALLERGIES:  is allergic to lisinopril.  MEDICATIONS:  Current Outpatient Prescriptions  Medication Sig Dispense Refill  . acetaminophen (TYLENOL) 325 MG tablet Take by mouth.    Marland Kitchen albuterol (PROVENTIL HFA;VENTOLIN HFA) 108 (90 BASE) MCG/ACT inhaler Inhale 1-2 puffs into the lungs every 4 (four) hours as needed for wheezing or shortness of breath. 1 Inhaler 3  . ALPRAZolam (XANAX) 0.25 MG tablet take 1 tablet by mouth three times a day if needed for anxiety 90 tablet 1  . amLODipine (NORVASC) 5 MG tablet Take 1 tablet (5 mg total) by mouth daily. 90 tablet 0  . ferrous sulfate 325 (65 FE) MG tablet Take by mouth 2 (two) times daily with a meal.     . FLUoxetine (PROZAC) 20 MG capsule Take 20 mg by mouth daily.  6  . gabapentin (NEURONTIN) 300 MG capsule Take 1 pill three times day; Take 1 extra  pill at night. 120 capsule 6  . lidocaine-prilocaine (EMLA) cream   0  . prochlorperazine (COMPAZINE) 10 MG tablet   0   No current facility-administered medications for this visit.    Facility-Administered Medications Ordered in Other Visits  Medication Dose Route Frequency Provider Last Rate Last Dose  . heparin lock flush 100 unit/mL  500 Units Intravenous Once Leia Alf, MD      . sodium chloride 0.9 % injection 10 mL  10 mL Intravenous PRN Leia Alf, MD      . sodium chloride 0.9 % injection 10 mL  10 mL Intravenous PRN Leia Alf, MD   10 mL at 11/28/14 1120  . sodium chloride flush (NS) 0.9 % injection 10 mL  10 mL Intravenous PRN Evlyn Kanner, NP   10 mL at 01/03/16 1413    PHYSICAL EXAMINATION: ECOG PERFORMANCE STATUS: 0 -  Asymptomatic  BP (!) 152/91   Pulse (!) 52   Temp 97.4 F (36.3 C) (Tympanic)   Resp 18   Ht '5\' 5"'  (1.651 m)   LMP 12/11/2013   There were no vitals filed for this visit.  GENERAL: Well-nourished well-developed; Alert, no distress and comfortable.   With her husband. EYES: no pallor or icterus OROPHARYNX: no thrush or ulceration; good dentition  NECK: supple, no masses felt LYMPH:  no palpable lymphadenopathy in the cervical, axillary or inguinal regions LUNGS: clear to auscultation and  No wheeze or crackles HEART/CVS: regular rate & rhythm and no murmurs; No lower extremity edema; positive for colostomy. ABDOMEN:abdomen soft, non-tender and normal bowel sounds Musculoskeletal:no cyanosis of digits and no clubbing  PSYCH: alert & oriented x 3 with fluent speech NEURO: no focal motor/sensory deficits SKIN:  no rashes or significant lesions  LABORATORY DATA:  I have reviewed the data as listed    Component Value Date/Time   NA 138 07/04/2016 0820   NA 144 08/29/2014 0908   K 3.6 07/04/2016 0820   K 3.3 (L) 08/29/2014 0908   CL 105 07/04/2016 0820   CL 107 08/29/2014 0908   CO2 25 07/04/2016 0820   CO2 29 08/29/2014 0908    GLUCOSE 101 (H) 07/04/2016 0820   GLUCOSE 88 08/29/2014 0908   BUN 14 07/04/2016 0820   BUN 18 08/29/2014 0908   CREATININE 0.70 07/04/2016 0820   CREATININE 0.52 11/07/2014 0902   CALCIUM 8.6 (L) 07/04/2016 0820   CALCIUM 8.4 (L) 08/29/2014 0908   PROT 7.2 07/04/2016 0820   PROT 6.8 11/07/2014 0902   ALBUMIN 4.1 07/04/2016 0820   ALBUMIN 3.5 11/07/2014 0902   AST 28 07/04/2016 0820   AST 41 11/07/2014 0902   ALT 27 07/04/2016 0820   ALT 31 11/07/2014 0902   ALKPHOS 96 07/04/2016 0820   ALKPHOS 137 (H) 11/07/2014 0902   BILITOT 0.7 07/04/2016 0820   BILITOT 0.8 11/07/2014 0902   GFRNONAA >60 07/04/2016 0820   GFRNONAA >60 11/07/2014 0902   GFRAA >60 07/04/2016 0820   GFRAA >60 11/07/2014 0902    No results found for: SPEP, UPEP  Lab Results  Component Value Date   WBC 3.8 07/04/2016   NEUTROABS 2.5 07/04/2016   HGB 13.1 07/04/2016   HCT 39.0 07/04/2016   MCV 84.1 07/04/2016   PLT 144 (L) 07/04/2016      Chemistry      Component Value Date/Time   NA 138 07/04/2016 0820   NA 144 08/29/2014 0908   K 3.6 07/04/2016 0820   K 3.3 (L) 08/29/2014 0908   CL 105 07/04/2016 0820   CL 107 08/29/2014 0908   CO2 25 07/04/2016 0820   CO2 29 08/29/2014 0908   BUN 14 07/04/2016 0820   BUN 18 08/29/2014 0908   CREATININE 0.70 07/04/2016 0820   CREATININE 0.52 11/07/2014 0902      Component Value Date/Time   CALCIUM 8.6 (L) 07/04/2016 0820   CALCIUM 8.4 (L) 08/29/2014 0908   ALKPHOS 96 07/04/2016 0820   ALKPHOS 137 (H) 11/07/2014 0902   AST 28 07/04/2016 0820   AST 41 11/07/2014 0902   ALT 27 07/04/2016 0820   ALT 31 11/07/2014 0902   BILITOT 0.7 07/04/2016 0820   BILITOT 0.8 11/07/2014 0902       RADIOGRAPHIC STUDIES: I have personally reviewed the radiological images as listed and agreed with the findings in the report. No results found.   ASSESSMENT &  PLAN:  Rectal cancer ([place]) 1. Rectal cancer, clinical TXN2M0, at least stage III [2015]. Clinically NED.  Recent colonoscopy June 2017 unremarkable. Will schedule CT abdomen/pelvis and chext Xray for surveillance ASAP. CEA pending.  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 Platelet count still low but mild and no bleeding issues. Continue to monitor.  # daughter- ALL/ no family Hx- genetic; we'll check pathology for MMR.   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 Increase dose of neurontin [300 mg 3 times a day; one extra pill at nighttime; and if not better add cymbalta.   # port explantation if CT okay  # plan CT asap; follow up in 6 months-labs;CEA. Plan scan again in 1 year.   # 25 minutes face-to-face with the patient discussing the above plan of care; more than 50% of time spent on prognosis/ natural history; counseling and coordination.   Orders Placed This Encounter  Procedures  . CT ABDOMEN PELVIS W CONTRAST    Standing Status:   Future    Standing Expiration Date:   10/03/2017    Order Specific Question:   Reason for Exam (SYMPTOM  OR DIAGNOSIS REQUIRED)    Answer:   rectal cancer    Order Specific Question:   Is the patient pregnant?    Answer:   No    Order Specific Question:   Preferred imaging location?    Answer:   [facility]  . CT CHEST W CONTRAST    Standing Status:   Future    Standing Expiration Date:   09/03/2017    Order Specific Question:   Reason for Exam (SYMPTOM  OR DIAGNOSIS REQUIRED)    Answer:   rectal cancer    Order Specific Question:   Is the patient pregnant?    Answer:   No    Order Specific Question:   Preferred imaging location?    Answer:   [facility]  . CBC with Differential    Standing Status:   Future    Standing Expiration Date:   07/04/2017  . Comprehensive metabolic panel    Standing Status:   Future    Standing Expiration Date:   07/04/2017  . CEA    Standing Status:   Future    Standing Expiration Date:   07/04/2017   All questions were answered. The patient knows to call the clinic with any problems, questions or concerns.      Cammie Sickle, MD 07/05/2016 7:53 AM

## 2016-07-05 LAB — CEA: CEA: 2 ng/mL (ref 0.0–4.7)

## 2016-07-11 ENCOUNTER — Ambulatory Visit
Admission: RE | Admit: 2016-07-11 | Discharge: 2016-07-11 | Disposition: A | Discharge status: Home / Self Care | Payer: Self-pay | Source: Ambulatory Visit | Attending: Internal Medicine | Admitting: Internal Medicine

## 2016-07-11 DIAGNOSIS — C20 Malignant neoplasm of rectum: Secondary | ICD-10-CM | POA: Insufficient documentation

## 2016-07-11 DIAGNOSIS — K769 Liver disease, unspecified: Secondary | ICD-10-CM | POA: Insufficient documentation

## 2016-07-11 MED ORDER — IOPAMIDOL (ISOVUE-300) INJECTION 61%
100.0000 mL | Freq: Once | INTRAVENOUS | Status: AC | PRN
  Administered 2016-07-11: 100 mL via INTRAVENOUS

## 2016-07-24 NOTE — Progress Notes (Deleted)
Subjective:    Patient ID: Tammie [place], female    DOB: 1965/12/27, 51 y.o.   MRN: [id number]  CC: Tammie [place] is a 51 y.o. female who presents today for follow up.   HPI: Anxiety and depression  HTN   Due for physical; please schedule at check out.      HISTORY:  Past Medical History:  Diagnosis Date  . Anemia    prior to CA dx  . Asthma    seasonal,never hospitalized  . Blood in stool   . Colostomy in place [facility])   . History of ERCP   . Hypertension   . Migraine   . 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 PTSD (post-traumatic stress disorder)   . Rectal cancer ([place])   . Wears contact lenses    Past Surgical History:  Procedure Laterality Date  . ABDOMINAL HYSTERECTOMY  04/12/14   [facility] with CA surgery  . CHOLECYSTECTOMY  2012   Dr. Jamal Collin  . COLONOSCOPY WITH PROPOFOL N/A 01/26/2016   Procedure: COLONOSCOPY WITH PROPOFOL through colostomy;  Surgeon: Lucilla Lame, MD;  Location: [place];  Service: Endoscopy;  Laterality: N/A;  port-a-cath  . POLYPECTOMY  01/26/2016   Procedure: POLYPECTOMY;  Surgeon: Lucilla Lame, MD;  Location: [place];  Service: Endoscopy;;  . PORTACATH PLACEMENT    . PROCTECTOMY  04/12/14   [facility] - CA surgery - with colostomy  . SALPINGOOPHORECTOMY Bilateral 04/12/14   [facility] - with CA surg  . VAGINA RECONSTRUCTION SURGERY  04/12/14   [facility] - transabdonimal rectus abdominus muscle reconstruction  . VAGINAL DELIVERY     2, preterm labor   Family History  Problem Relation Age of Onset  . Arthritis Mother   . Stroke Mother   . Hypertension Mother   . Diabetes Mother   . Arthritis Father   . Heart disease Father 23    s/p CABG  . Stroke Father   . Hypertension Father   . Alcohol abuse Maternal Grandmother   . Diabetes Maternal Grandmother   . Alcohol abuse Maternal Grandfather   . Diabetes Maternal Grandfather   . Cancer Paternal Grandmother     breast  . Heart disease Paternal Grandfather   . Breast  cancer Paternal Aunt 3    Allergies: Lisinopril Current Outpatient Prescriptions on File Prior to Visit  Medication Sig Dispense Refill  . acetaminophen (TYLENOL) 325 MG tablet Take by mouth.    Marland Kitchen albuterol (PROVENTIL HFA;VENTOLIN HFA) 108 (90 BASE) MCG/ACT inhaler Inhale 1-2 puffs into the lungs every 4 (four) hours as needed for wheezing or shortness of breath. 1 Inhaler 3  . ALPRAZolam (XANAX) 0.25 MG tablet take 1 tablet by mouth three times a day if needed for anxiety 90 tablet 1  . amLODipine (NORVASC) 5 MG tablet Take 1 tablet (5 mg total) by mouth daily. 90 tablet 0  . ferrous sulfate 325 (65 FE) MG tablet Take by mouth 2 (two) times daily with a meal.     . FLUoxetine (PROZAC) 20 MG capsule Take 20 mg by mouth daily.  6  . gabapentin (NEURONTIN) 300 MG capsule Take 1 pill three times day; Take 1 extra pill at night. 120 capsule 6  . lidocaine-prilocaine (EMLA) cream   0  . prochlorperazine (COMPAZINE) 10 MG tablet   0   Current Facility-Administered Medications on File Prior to Visit  Medication Dose Route Frequency Provider Last Rate Last Dose  .  heparin lock flush 100 unit/mL  500 Units Intravenous Once Leia Alf, MD      . sodium chloride 0.9 % injection 10 mL  10 mL Intravenous PRN Leia Alf, MD      . sodium chloride 0.9 % injection 10 mL  10 mL Intravenous PRN Leia Alf, MD   10 mL at 11/28/14 1120  . sodium chloride flush (NS) 0.9 % injection 10 mL  10 mL Intravenous PRN Evlyn Kanner, NP   10 mL at 01/03/16 1413    Social History  Substance Use Topics  . Smoking status: Never Smoker  . Smokeless tobacco: Never Used  . Alcohol use No     Comment: rare - holidays    Review of Systems    Objective:    LMP 12/11/2013  BP Readings from Last 3 Encounters:  07/04/16 (!) 152/91  01/26/16 125/76  01/08/16 (!) 145/98   Wt Readings from Last 3 Encounters:  01/26/16 211 lb (95.7 kg)  01/08/16 209 lb 14.1 oz (95.2 kg)  01/03/16 212 lb (96.2 kg)     Physical Exam     Assessment & Plan:   Problem List Items Addressed This Visit    None       I am having Ms. Mitcham maintain her acetaminophen, ferrous sulfate, prochlorperazine, lidocaine-prilocaine, albuterol, ALPRAZolam, FLUoxetine, amLODipine, and gabapentin.   No orders of the defined types were placed in this encounter.   Return precautions given.   Risks, benefits, and alternatives of the medications and treatment plan prescribed today were discussed, and patient expressed understanding.   Education regarding symptom management and diagnosis given to patient on AVS.  Continue to follow with [name], FNP for routine health maintenance.   Lenard Simmer and I agreed with plan.   [name], FNP

## 2016-07-25 ENCOUNTER — Ambulatory Visit: Payer: Self-pay | Admitting: Family

## 2016-07-26 ENCOUNTER — Other Ambulatory Visit: Payer: Self-pay | Admitting: Nurse Practitioner

## 2016-07-29 ENCOUNTER — Ambulatory Visit (INDEPENDENT_AMBULATORY_CARE_PROVIDER_SITE_OTHER): Payer: Self-pay | Admitting: Family

## 2016-07-29 ENCOUNTER — Encounter: Payer: Self-pay | Admitting: Family

## 2016-07-29 VITALS — BP 144/80 | HR 81 | Temp 97.8°F | Ht 65.0 in | Wt 233.4 lb

## 2016-07-29 DIAGNOSIS — M25561 Pain in right knee: Secondary | ICD-10-CM

## 2016-07-29 DIAGNOSIS — F418 Other specified anxiety disorders: Secondary | ICD-10-CM

## 2016-07-29 DIAGNOSIS — F329 Major depressive disorder, single episode, unspecified: Secondary | ICD-10-CM

## 2016-07-29 DIAGNOSIS — M25569 Pain in unspecified knee: Secondary | ICD-10-CM | POA: Insufficient documentation

## 2016-07-29 DIAGNOSIS — F419 Anxiety disorder, unspecified: Secondary | ICD-10-CM

## 2016-07-29 DIAGNOSIS — I10 Essential (primary) hypertension: Secondary | ICD-10-CM

## 2016-07-29 MED ORDER — FLUOXETINE HCL 40 MG PO CAPS: 40.0000 mg | ORAL_CAPSULE | Freq: Every day | ORAL | 1 refills | Status: DC

## 2016-07-29 MED ORDER — ALPRAZOLAM 0.25 MG PO TABS: ORAL_TABLET | ORAL | 0 refills | Status: DC

## 2016-07-29 MED ORDER — AMLODIPINE BESYLATE 10 MG PO TABS: 10.0000 mg | ORAL_TABLET | Freq: Every day | ORAL | 2 refills | Status: DC

## 2016-07-29 NOTE — Progress Notes (Signed)
Subjective:    Patient ID: Tammie [place], female    DOB: [birth year], 51 y.o.   MRN: [id number]  CC: Tammie [place] is a 51 y.o. female who presents today for follow up.   HPI:   HTN- On amlodipine. Denies exertional chest pain or pressure, numbness or tingling radiating to left arm or jaw, palpitations, dizziness, frequent headaches, changes in vision, or shortness of breath.   Anxiety and depression - taking xanax TID. 'not really helping.' Would like to increase prozac. Trouble sleeping and getting going in the morning. Worries over dgtr with leukemia diagnosis.   Right knee pain for several months and actually had gotten better then fell 3 weeks ago fell on wet floor.  resting helps. Describes cracking, popping. Worse after fall in right knee. Getting of chair causes pain. Takes tyleonol. Endorses swelling in right knee and ankle if have 'up on it all day'. No SOB, DVT history.     HISTORY:  Past Medical History:  Diagnosis Date  . Anemia    prior to CA dx  . Asthma    seasonal,never hospitalized  . Blood in stool   . Colostomy in place [facility])   . History of ERCP   . Hypertension   . Migraine   . 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 PTSD (post-traumatic stress disorder)   . Rectal cancer ([place])   . Wears contact lenses    Past Surgical History:  Procedure Laterality Date  . ABDOMINAL HYSTERECTOMY  04/12/14   [facility] with CA surgery  . CHOLECYSTECTOMY  2012   Dr. Jamal Collin  . COLONOSCOPY WITH PROPOFOL N/A 01/26/2016   Procedure: COLONOSCOPY WITH PROPOFOL through colostomy;  Surgeon: Lucilla Lame, MD;  Location: [place];  Service: Endoscopy;  Laterality: N/A;  port-a-cath  . POLYPECTOMY  01/26/2016   Procedure: POLYPECTOMY;  Surgeon: Lucilla Lame, MD;  Location: [place];  Service: Endoscopy;;  . PORTACATH PLACEMENT    . PROCTECTOMY  04/12/14   [facility] - CA surgery - with colostomy  . SALPINGOOPHORECTOMY Bilateral 04/12/14   [facility] - with CA surg    . VAGINA RECONSTRUCTION SURGERY  04/12/14   [facility] - transabdonimal rectus abdominus muscle reconstruction  . VAGINAL DELIVERY     2, preterm labor   Family History  Problem Relation Age of Onset  . Arthritis Mother   . Stroke Mother   . Hypertension Mother   . Diabetes Mother   . Arthritis Father   . Heart disease Father 19    s/p CABG  . Stroke Father   . Hypertension Father   . Alcohol abuse Maternal Grandmother   . Diabetes Maternal Grandmother   . Alcohol abuse Maternal Grandfather   . Diabetes Maternal Grandfather   . Cancer Paternal Grandmother     breast  . Heart disease Paternal Grandfather   . Breast cancer Paternal Aunt 70  . Colon cancer Neg Hx     Allergies: Lisinopril Current Outpatient Prescriptions on File Prior to Visit  Medication Sig Dispense Refill  . acetaminophen (TYLENOL) 325 MG tablet Take by mouth.    Marland Kitchen albuterol (PROVENTIL HFA;VENTOLIN HFA) 108 (90 BASE) MCG/ACT inhaler Inhale 1-2 puffs into the lungs every 4 (four) hours as needed for wheezing or shortness of breath. 1 Inhaler 3  . ferrous sulfate 325 (65 FE) MG tablet Take by mouth 2 (two) times daily with a meal.     . gabapentin (NEURONTIN) 300 MG  capsule Take 1 pill three times day; Take 1 extra pill at night. 120 capsule 6  . lidocaine-prilocaine (EMLA) cream   0  . prochlorperazine (COMPAZINE) 10 MG tablet   0   Current Facility-Administered Medications on File Prior to Visit  Medication Dose Route Frequency Provider Last Rate Last Dose  . heparin lock flush 100 unit/mL  500 Units Intravenous Once Leia Alf, MD      . sodium chloride 0.9 % injection 10 mL  10 mL Intravenous PRN Leia Alf, MD      . sodium chloride 0.9 % injection 10 mL  10 mL Intravenous PRN Leia Alf, MD   10 mL at 11/28/14 1120  . sodium chloride flush (NS) 0.9 % injection 10 mL  10 mL Intravenous PRN Evlyn Kanner, NP   10 mL at 01/03/16 1413    Social History  Substance Use Topics  . Smoking status:  Never Smoker  . Smokeless tobacco: Never Used  . Alcohol use No     Comment: rare - holidays    Review of Systems  Constitutional: Negative for chills and fever.  Respiratory: Negative for cough.   Cardiovascular: Negative for chest pain and palpitations.  Gastrointestinal: Negative for nausea and vomiting.  Musculoskeletal: Positive for joint swelling.      Objective:    BP (!) 144/80   Pulse 81   Temp 97.8 F (36.6 C) (Oral)   Ht 5\' 5"  (1.651 m)   Wt 233 lb 6.4 oz (105.9 kg)   LMP 12/11/2013   SpO2 97%   BMI 38.84 kg/m  BP Readings from Last 3 Encounters:  07/29/16 (!) 144/80  07/04/16 (!) 152/91  01/26/16 125/76   Wt Readings from Last 3 Encounters:  07/29/16 233 lb 6.4 oz (105.9 kg)  01/26/16 211 lb (95.7 kg)  01/08/16 209 lb 14.1 oz (95.2 kg)    Physical Exam  Constitutional: She appears well-developed and well-nourished.  Eyes: Conjunctivae are normal.  Cardiovascular: Normal rate, regular rhythm, normal heart sounds and normal pulses.   Pulmonary/Chest: Effort normal and breath sounds normal. She has no wheezes. She has no rhonchi. She has no rales.  Musculoskeletal:       Right knee: She exhibits normal range of motion, no swelling, no effusion and no ecchymosis. No tenderness found.  Bilateral knees are symmetric. No effusion appreciated. No increase in warmth or erythema. Crepitus felt with flexion of bilateral knees.  Right knee:  Able to extend to -5 to 10 degrees and flex to 110 degrees. No catching with McMurray maneuver. No patellar apprehension. Negative anterior drawer and lachman's- no laxity appreciated.  No calf tenderness of lower leg or edema bilaterally.    Neurological: She is alert.  Skin: Skin is warm and dry.  Psychiatric: She has a normal mood and affect. Her speech is normal and behavior is normal. Thought content normal.  Vitals reviewed.      Assessment & Plan:   Problem List Items Addressed This Visit      Cardiovascular  and Mediastinum   Hypertension - Primary    Will increase amlodipine back to 10 mg. F/u 6-8 weeks      Relevant Medications   amLODipine (NORVASC) 10 MG tablet     Other   Anxiety and depression    Will increase prozac to 40mg . Continued xanax for now however Discussed risks of BZDs and patient agreed with long term plan of decreasing/discontinuing.I looked up patient on [place] Controlled Substances Reporting System  and saw no activity that raised concern of inappropriate use. Will consider zoloft at bedtime if no improvement for sleep.        Relevant Medications   ALPRAZolam (XANAX) 0.25 MG tablet   FLUoxetine (PROZAC) 40 MG capsule   Knee pain    Symptoms consistent with arthritis. Patient politely declined x-ray of knee today. Discussed options with joint injections which she can have done with orthopedics, she also declined this at this time. We discussed conservative treatment including exercise and strengthening quad muscle. We will continue to follow.          I have changed Ms. Casanas's FLUoxetine and amLODipine. I am also having her maintain her acetaminophen, ferrous sulfate, prochlorperazine, lidocaine-prilocaine, albuterol, gabapentin, and ALPRAZolam.   Meds ordered this encounter  Medications  . ALPRAZolam (XANAX) 0.25 MG tablet    Sig: take 1 tablet by mouth three times a day if needed for anxiety    Dispense:  90 tablet    Refill:  0    Order Specific Question:   Supervising Provider    Answer:   Deborra Medina L [[id number]]  . FLUoxetine (PROZAC) 40 MG capsule    Sig: Take 1 capsule (40 mg total) by mouth daily.    Dispense:  90 capsule    Refill:  1    Order Specific Question:   Supervising Provider    Answer:   Derrel Nip, Anthea L [[id number]]  . amLODipine (NORVASC) 10 MG tablet    Sig: Take 1 tablet (10 mg total) by mouth daily.    Dispense:  90 tablet    Refill:  2    Order Specific Question:   Supervising Provider    Answer:   Crecencio Mc [[id number]]    Return  precautions given.   Risks, benefits, and alternatives of the medications and treatment plan prescribed today were discussed, and patient expressed understanding.   Education regarding symptom management and diagnosis given to patient on AVS.  Continue to follow with [name], FNP for routine health maintenance.   Lenard Simmer and I agreed with plan.   [name], FNP

## 2016-07-29 NOTE — Progress Notes (Signed)
Pre visit review using our clinic review tool, if applicable. No additional management support is needed unless otherwise documented below in the visit note. 

## 2016-07-29 NOTE — Assessment & Plan Note (Signed)
Symptoms consistent with arthritis. Patient politely declined x-ray of knee today. Discussed options with joint injections which she can have done with orthopedics, she also declined this at this time. We discussed conservative treatment including exercise and strengthening quad muscle. We will continue to follow.

## 2016-07-29 NOTE — Assessment & Plan Note (Signed)
Will increase amlodipine back to 10 mg. F/u 6-8 weeks

## 2016-07-29 NOTE — Assessment & Plan Note (Signed)
Will increase prozac to 40mg . Continued xanax for now however Discussed risks of BZDs and patient agreed with long term plan of decreasing/discontinuing.I looked up patient on  Controlled Substances Reporting System and saw no activity that raised concern of inappropriate use. Will consider zoloft at bedtime if no improvement for sleep.

## 2016-07-29 NOTE — Patient Instructions (Addendum)
F/u for CPE 6 months  Increased amlodipine and prozac  F/u 6 weeks to see if working  [employer]

## 2016-07-30 ENCOUNTER — Telehealth: Payer: Self-pay | Admitting: Internal Medicine

## 2016-07-30 NOTE — Telephone Encounter (Signed)
Left a message for the patient to discuss the results of the CT scan; no concerns for any recurrent disease. Follow-up as planned.

## 2016-08-15 ENCOUNTER — Inpatient Hospital Stay: Payer: Self-pay | Attending: Internal Medicine

## 2016-08-15 DIAGNOSIS — Z95828 Presence of other vascular implants and grafts: Secondary | ICD-10-CM

## 2016-08-15 DIAGNOSIS — Z452 Encounter for adjustment and management of vascular access device: Secondary | ICD-10-CM | POA: Insufficient documentation

## 2016-08-15 DIAGNOSIS — Z85048 Personal history of other malignant neoplasm of rectum, rectosigmoid junction, and anus: Secondary | ICD-10-CM | POA: Insufficient documentation

## 2016-08-15 MED ORDER — SODIUM CHLORIDE 0.9% FLUSH
10.0000 mL | INTRAVENOUS | Status: DC | PRN
  Administered 2016-08-15: 10 mL via INTRAVENOUS
  Filled 2016-08-15: qty 10

## 2016-08-15 MED ORDER — HEPARIN SOD (PORK) LOCK FLUSH 100 UNIT/ML IV SOLN
500.0000 [IU] | Freq: Once | INTRAVENOUS | Status: AC
  Administered 2016-08-15: 500 [IU] via INTRAVENOUS

## 2016-09-11 ENCOUNTER — Encounter: Payer: Self-pay | Admitting: Family

## 2016-09-11 ENCOUNTER — Ambulatory Visit (INDEPENDENT_AMBULATORY_CARE_PROVIDER_SITE_OTHER): Payer: Self-pay | Admitting: Family

## 2016-09-11 VITALS — BP 130/84 | HR 75 | Temp 97.6°F | Wt 237.0 lb

## 2016-09-11 DIAGNOSIS — I10 Essential (primary) hypertension: Secondary | ICD-10-CM

## 2016-09-11 DIAGNOSIS — F329 Major depressive disorder, single episode, unspecified: Secondary | ICD-10-CM

## 2016-09-11 DIAGNOSIS — F32A Depression, unspecified: Secondary | ICD-10-CM

## 2016-09-11 DIAGNOSIS — F418 Other specified anxiety disorders: Secondary | ICD-10-CM

## 2016-09-11 DIAGNOSIS — F419 Anxiety disorder, unspecified: Principal | ICD-10-CM

## 2016-09-11 MED ORDER — ALPRAZOLAM 0.25 MG PO TABS: 0.2500 mg | ORAL_TABLET | Freq: Every evening | ORAL | 0 refills | Status: DC | PRN

## 2016-09-11 NOTE — Progress Notes (Signed)
Pre visit review using our clinic review tool, if applicable. No additional management support is needed unless otherwise documented below in the visit note. 

## 2016-09-11 NOTE — Patient Instructions (Signed)
Pleasure seeing you, as always

## 2016-09-11 NOTE — Assessment & Plan Note (Signed)
At goal. Continue current regimen. 

## 2016-09-11 NOTE — Progress Notes (Signed)
Subjective:    Patient ID: Tammie [place], female    DOB: 10-12-1965, 51 y.o.   MRN: [id number]  CC: Tammie [place] is a 51 y.o. female who presents today for follow up.   HPI:   HTN- increased amlodipine at last visit. Denies exertional chest pain or pressure, numbness or tingling radiating to left arm or jaw, palpitations, dizziness, frequent headaches, changes in vision, or shortness of breath.   Depression and anxiety- stable. Sleeping well. Uses xanax nightly for sleep.       HISTORY:  Past Medical History:  Diagnosis Date  . Anemia    prior to CA dx  . Asthma    seasonal,never hospitalized  . Blood in stool   . Colostomy in place [facility])   . History of ERCP   . Hypertension   . Migraine   . 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 PTSD (post-traumatic stress disorder)   . Rectal cancer ([place])   . Wears contact lenses    Past Surgical History:  Procedure Laterality Date  . ABDOMINAL HYSTERECTOMY  04/12/14   [facility] with CA surgery  . CHOLECYSTECTOMY  2012   Dr. Jamal Collin  . COLONOSCOPY WITH PROPOFOL N/A 01/26/2016   Procedure: COLONOSCOPY WITH PROPOFOL through colostomy;  Surgeon: Lucilla Lame, MD;  Location: [place];  Service: Endoscopy;  Laterality: N/A;  port-a-cath  . POLYPECTOMY  01/26/2016   Procedure: POLYPECTOMY;  Surgeon: Lucilla Lame, MD;  Location: [place];  Service: Endoscopy;;  . PORTACATH PLACEMENT    . PROCTECTOMY  04/12/14   [facility] - CA surgery - with colostomy  . SALPINGOOPHORECTOMY Bilateral 04/12/14   [facility] - with CA surg  . VAGINA RECONSTRUCTION SURGERY  04/12/14   [facility] - transabdonimal rectus abdominus muscle reconstruction  . VAGINAL DELIVERY     2, preterm labor   Family History  Problem Relation Age of Onset  . Arthritis Mother   . Stroke Mother   . Hypertension Mother   . Diabetes Mother   . Arthritis Father   . Heart disease Father 35    s/p CABG  . Stroke Father   . Hypertension Father   . Alcohol abuse  Maternal Grandmother   . Diabetes Maternal Grandmother   . Alcohol abuse Maternal Grandfather   . Diabetes Maternal Grandfather   . Cancer Paternal Grandmother     breast  . Heart disease Paternal Grandfather   . Breast cancer Paternal Aunt 83  . Colon cancer Neg Hx     Allergies: Lisinopril Current Outpatient Prescriptions on File Prior to Visit  Medication Sig Dispense Refill  . acetaminophen (TYLENOL) 325 MG tablet Take by mouth.    Marland Kitchen albuterol (PROVENTIL HFA;VENTOLIN HFA) 108 (90 BASE) MCG/ACT inhaler Inhale 1-2 puffs into the lungs every 4 (four) hours as needed for wheezing or shortness of breath. 1 Inhaler 3  . amLODipine (NORVASC) 10 MG tablet Take 1 tablet (10 mg total) by mouth daily. 90 tablet 2  . ferrous sulfate 325 (65 FE) MG tablet Take by mouth 2 (two) times daily with a meal.     . FLUoxetine (PROZAC) 40 MG capsule Take 1 capsule (40 mg total) by mouth daily. 90 capsule 1  . gabapentin (NEURONTIN) 300 MG capsule Take 1 pill three times day; Take 1 extra pill at night. 120 capsule 6  . lidocaine-prilocaine (EMLA) cream   0  . prochlorperazine (COMPAZINE) 10 MG tablet   0  Current Facility-Administered Medications on File Prior to Visit  Medication Dose Route Frequency Provider Last Rate Last Dose  . heparin lock flush 100 unit/mL  500 Units Intravenous Once Leia Alf, MD      . sodium chloride 0.9 % injection 10 mL  10 mL Intravenous PRN Leia Alf, MD      . sodium chloride 0.9 % injection 10 mL  10 mL Intravenous PRN Leia Alf, MD   10 mL at 11/28/14 1120  . sodium chloride flush (NS) 0.9 % injection 10 mL  10 mL Intravenous PRN Evlyn Kanner, NP   10 mL at 01/03/16 1413    Social History  Substance Use Topics  . Smoking status: Never Smoker  . Smokeless tobacco: Never Used  . Alcohol use No     Comment: rare - holidays    Review of Systems  Constitutional: Negative for chills and fever.  Respiratory: Negative for cough.   Cardiovascular:  Negative for chest pain and palpitations.  Gastrointestinal: Negative for nausea and vomiting.  Psychiatric/Behavioral: Positive for sleep disturbance. The patient is nervous/anxious.       Objective:    BP 130/84 (BP Location: Right Arm, Patient Position: Sitting, Cuff Size: Large)   Pulse 75   Temp 97.6 F (36.4 C) (Oral)   Wt 237 lb (107.5 kg)   LMP 12/11/2013   SpO2 95%   BMI 39.44 kg/m  BP Readings from Last 3 Encounters:  09/11/16 130/84  07/29/16 (!) 144/80  07/04/16 (!) 152/91   Wt Readings from Last 3 Encounters:  09/11/16 237 lb (107.5 kg)  07/29/16 233 lb 6.4 oz (105.9 kg)  01/26/16 211 lb (95.7 kg)    Physical Exam  Constitutional: She appears well-developed and well-nourished.  Eyes: Conjunctivae are normal.  Cardiovascular: Normal rate, regular rhythm, normal heart sounds and normal pulses.   Pulmonary/Chest: Effort normal and breath sounds normal. She has no wheezes. She has no rhonchi. She has no rales.  Neurological: She is alert.  Skin: Skin is warm and dry.  Psychiatric: She has a normal mood and affect. Her speech is normal and behavior is normal. Thought content normal.  Vitals reviewed.      Assessment & Plan:   Problem List Items Addressed This Visit      Cardiovascular and Mediastinum   Hypertension    At goal. Continue current regimen.        Other   Anxiety and depression - Primary    Stable. Continue current regimen.      Relevant Medications   ALPRAZolam (XANAX) 0.25 MG tablet       I have changed Tammie [place]'s ALPRAZolam. I am also having her maintain her acetaminophen, ferrous sulfate, prochlorperazine, lidocaine-prilocaine, albuterol, gabapentin, FLUoxetine, and amLODipine.   Meds ordered this encounter  Medications  . ALPRAZolam (XANAX) 0.25 MG tablet    Sig: Take 1 tablet (0.25 mg total) by mouth at bedtime as needed for anxiety.    Dispense:  30 tablet    Refill:  0    Order Specific Question:   Supervising Provider     Answer:   Crecencio Mc [[id number]]    Return precautions given.   Risks, benefits, and alternatives of the medications and treatment plan prescribed today were discussed, and patient expressed understanding.   Education regarding symptom management and diagnosis given to patient on AVS.  Continue to follow with [name], FNP for routine health maintenance.   Lenard Simmer and I agreed with  plan.   Peniel Hass, FNP   

## 2016-09-11 NOTE — Assessment & Plan Note (Signed)
Stable  Continue current regimen  

## 2016-09-26 ENCOUNTER — Inpatient Hospital Stay: Payer: Self-pay | Attending: Internal Medicine

## 2016-09-26 DIAGNOSIS — Z85048 Personal history of other malignant neoplasm of rectum, rectosigmoid junction, and anus: Secondary | ICD-10-CM | POA: Insufficient documentation

## 2016-09-26 DIAGNOSIS — Z95828 Presence of other vascular implants and grafts: Secondary | ICD-10-CM

## 2016-09-26 DIAGNOSIS — Z452 Encounter for adjustment and management of vascular access device: Secondary | ICD-10-CM | POA: Insufficient documentation

## 2016-09-26 MED ORDER — SODIUM CHLORIDE 0.9% FLUSH
10.0000 mL | INTRAVENOUS | Status: DC | PRN
  Administered 2016-09-26: 10 mL via INTRAVENOUS
  Filled 2016-09-26: qty 10

## 2016-09-26 MED ORDER — HEPARIN SOD (PORK) LOCK FLUSH 100 UNIT/ML IV SOLN
500.0000 [IU] | Freq: Once | INTRAVENOUS | Status: AC
  Administered 2016-09-26: 500 [IU] via INTRAVENOUS

## 2016-10-19 ENCOUNTER — Other Ambulatory Visit: Payer: Self-pay | Admitting: Family

## 2016-10-19 DIAGNOSIS — F329 Major depressive disorder, single episode, unspecified: Secondary | ICD-10-CM

## 2016-10-19 DIAGNOSIS — F32A Depression, unspecified: Secondary | ICD-10-CM

## 2016-10-19 DIAGNOSIS — F419 Anxiety disorder, unspecified: Principal | ICD-10-CM

## 2016-10-30 ENCOUNTER — Encounter: Payer: Self-pay | Admitting: Family

## 2016-10-30 ENCOUNTER — Other Ambulatory Visit: Payer: Self-pay | Admitting: Family

## 2016-10-30 DIAGNOSIS — F419 Anxiety disorder, unspecified: Principal | ICD-10-CM

## 2016-10-30 DIAGNOSIS — F329 Major depressive disorder, single episode, unspecified: Secondary | ICD-10-CM

## 2016-10-31 ENCOUNTER — Other Ambulatory Visit: Payer: Self-pay | Admitting: Family

## 2016-10-31 DIAGNOSIS — F419 Anxiety disorder, unspecified: Principal | ICD-10-CM

## 2016-10-31 DIAGNOSIS — F32A Depression, unspecified: Secondary | ICD-10-CM

## 2016-10-31 DIAGNOSIS — F329 Major depressive disorder, single episode, unspecified: Secondary | ICD-10-CM

## 2016-10-31 MED ORDER — ALPRAZOLAM 0.25 MG PO TABS: 0.2500 mg | ORAL_TABLET | Freq: Every evening | ORAL | 1 refills | Status: DC | PRN

## 2016-11-15 ENCOUNTER — Inpatient Hospital Stay: Payer: Self-pay | Attending: Internal Medicine

## 2016-11-15 DIAGNOSIS — Z452 Encounter for adjustment and management of vascular access device: Secondary | ICD-10-CM | POA: Insufficient documentation

## 2016-11-15 DIAGNOSIS — Z95828 Presence of other vascular implants and grafts: Secondary | ICD-10-CM

## 2016-11-15 DIAGNOSIS — C20 Malignant neoplasm of rectum: Secondary | ICD-10-CM | POA: Insufficient documentation

## 2016-11-15 MED ORDER — HEPARIN SOD (PORK) LOCK FLUSH 100 UNIT/ML IV SOLN
500.0000 [IU] | Freq: Once | INTRAVENOUS | Status: AC
  Administered 2016-11-15: 500 [IU] via INTRAVENOUS

## 2016-11-15 MED ORDER — SODIUM CHLORIDE 0.9% FLUSH
10.0000 mL | INTRAVENOUS | Status: AC | PRN
  Administered 2016-11-15: 10 mL via INTRAVENOUS
  Filled 2016-11-15: qty 10

## 2016-11-29 ENCOUNTER — Other Ambulatory Visit: Payer: Self-pay | Admitting: Family

## 2016-11-29 DIAGNOSIS — F419 Anxiety disorder, unspecified: Principal | ICD-10-CM

## 2016-11-29 DIAGNOSIS — F329 Major depressive disorder, single episode, unspecified: Secondary | ICD-10-CM

## 2016-12-26 ENCOUNTER — Encounter: Payer: Self-pay | Admitting: Family

## 2016-12-26 DIAGNOSIS — F329 Major depressive disorder, single episode, unspecified: Secondary | ICD-10-CM

## 2016-12-26 DIAGNOSIS — F419 Anxiety disorder, unspecified: Principal | ICD-10-CM

## 2016-12-26 MED ORDER — ALPRAZOLAM 0.25 MG PO TABS: 0.2500 mg | ORAL_TABLET | Freq: Every evening | ORAL | 1 refills | Status: DC | PRN

## 2017-01-03 ENCOUNTER — Ambulatory Visit: Payer: Self-pay | Admitting: Internal Medicine

## 2017-01-03 ENCOUNTER — Other Ambulatory Visit: Payer: Self-pay

## 2017-01-15 ENCOUNTER — Inpatient Hospital Stay: Payer: Self-pay | Attending: Internal Medicine | Admitting: *Deleted

## 2017-01-15 ENCOUNTER — Inpatient Hospital Stay: Payer: Self-pay

## 2017-01-15 ENCOUNTER — Inpatient Hospital Stay (HOSPITAL_BASED_OUTPATIENT_CLINIC_OR_DEPARTMENT_OTHER): Payer: Self-pay | Admitting: Internal Medicine

## 2017-01-15 VITALS — BP 125/83 | HR 64 | Temp 97.8°F | Resp 18 | Ht 65.0 in | Wt 240.0 lb

## 2017-01-15 DIAGNOSIS — E669 Obesity, unspecified: Secondary | ICD-10-CM

## 2017-01-15 DIAGNOSIS — R140 Abdominal distension (gaseous): Secondary | ICD-10-CM

## 2017-01-15 DIAGNOSIS — Z933 Colostomy status: Secondary | ICD-10-CM

## 2017-01-15 DIAGNOSIS — I10 Essential (primary) hypertension: Secondary | ICD-10-CM | POA: Insufficient documentation

## 2017-01-15 DIAGNOSIS — D6959 Other secondary thrombocytopenia: Secondary | ICD-10-CM

## 2017-01-15 DIAGNOSIS — F4310 Post-traumatic stress disorder, unspecified: Secondary | ICD-10-CM | POA: Insufficient documentation

## 2017-01-15 DIAGNOSIS — G629 Polyneuropathy, unspecified: Secondary | ICD-10-CM

## 2017-01-15 DIAGNOSIS — Z79899 Other long term (current) drug therapy: Secondary | ICD-10-CM

## 2017-01-15 DIAGNOSIS — J45909 Unspecified asthma, uncomplicated: Secondary | ICD-10-CM | POA: Insufficient documentation

## 2017-01-15 DIAGNOSIS — Z95828 Presence of other vascular implants and grafts: Secondary | ICD-10-CM

## 2017-01-15 DIAGNOSIS — Z803 Family history of malignant neoplasm of breast: Secondary | ICD-10-CM

## 2017-01-15 DIAGNOSIS — Z9221 Personal history of antineoplastic chemotherapy: Secondary | ICD-10-CM | POA: Insufficient documentation

## 2017-01-15 DIAGNOSIS — C20 Malignant neoplasm of rectum: Secondary | ICD-10-CM | POA: Insufficient documentation

## 2017-01-15 LAB — COMPREHENSIVE METABOLIC PANEL
ALK PHOS: 115 U/L (ref 38–126)
ALT: 53 U/L (ref 14–54)
AST: 38 U/L (ref 15–41)
Albumin: 4.1 g/dL (ref 3.5–5.0)
Anion gap: 8 (ref 5–15)
BILIRUBIN TOTAL: 0.7 mg/dL (ref 0.3–1.2)
BUN: 14 mg/dL (ref 6–20)
CALCIUM: 8.7 mg/dL — AB (ref 8.9–10.3)
CO2: 25 mmol/L (ref 22–32)
CREATININE: 0.75 mg/dL (ref 0.44–1.00)
Chloride: 105 mmol/L (ref 101–111)
GFR calc Af Amer: >60 mL/min (ref >60)
Glucose, Bld: 108 mg/dL — ABNORMAL HIGH (ref 65–99)
POTASSIUM: 3.9 mmol/L (ref 3.5–5.1)
Sodium: 138 mmol/L (ref 135–145)
TOTAL PROTEIN: 7.3 g/dL (ref 6.5–8.1)

## 2017-01-15 LAB — CBC WITH DIFFERENTIAL/PLATELET
BASOS ABS: 0 10*3/uL (ref 0–0.1)
Basophils Relative: 1 %
Eosinophils Absolute: 0.2 10*3/uL (ref 0–0.7)
Eosinophils Relative: 4 %
HEMATOCRIT: 39.5 % (ref 35.0–47.0)
HEMOGLOBIN: 13.5 g/dL (ref 12.0–16.0)
LYMPHS PCT: 18 %
Lymphs Abs: 0.8 10*3/uL — ABNORMAL LOW (ref 1.0–3.6)
MCH: 28.1 pg (ref 26.0–34.0)
MCHC: 34.1 g/dL (ref 32.0–36.0)
MCV: 82.5 fL (ref 80.0–100.0)
MONO ABS: 0.3 10*3/uL (ref 0.2–0.9)
Monocytes Relative: 7 %
NEUTROS ABS: 3.1 10*3/uL (ref 1.4–6.5)
Neutrophils Relative %: 70 %
Platelets: 196 10*3/uL (ref 150–440)
RBC: 4.79 MIL/uL (ref 3.80–5.20)
RDW: 13.4 % (ref 11.5–14.5)
WBC: 4.4 10*3/uL (ref 3.6–11.0)

## 2017-01-15 MED ORDER — SODIUM CHLORIDE 0.9% FLUSH
10.0000 mL | INTRAVENOUS | Status: DC | PRN
  Administered 2017-01-15: 10 mL via INTRAVENOUS
  Filled 2017-01-15: qty 10

## 2017-01-15 MED ORDER — HEPARIN SOD (PORK) LOCK FLUSH 100 UNIT/ML IV SOLN
500.0000 [IU] | Freq: Once | INTRAVENOUS | Status: AC
  Administered 2017-01-15: 500 [IU] via INTRAVENOUS

## 2017-01-15 NOTE — Progress Notes (Signed)
[name] OFFICE PROGRESS NOTE  Patient Care Team: Burnard Hawthorne, FNP as PCP - General (Family Medicine) Rico Junker, RN as Registered Nurse Theodore Demark, RN as Registered Nurse  Cancer Staging Rectal cancer [facility]) Staging form: Colon and Rectum, AJCC 7th Edition - Clinical stage from 11/21/2014: Stage Unknown ([place], N2, M0) - Signed by Leia Alf, MD on 11/21/2014    Oncology History   # 2015- RECTAL CA STAGE III-  s/p neoadjuvant radiation and concurrent 5FU chemotherapy completed end of July/early Aug 2015.  (initially diagnosed on 12/22/13 - upper GI showed an ulcer and colonoscopy showed a large rectal mass at the most proximal extent 4 cm from the anus). Then had Abdominoperineal resection, radical hysterectomy, vaginectomy with removal of tubes/ovaries with rectus flap myocutaneous vaginal reconstruction at [facility] on 04/12/14. Surgical pathology reports residual invasive rectal adenocarcinoma low-grade, tumor invades muscularis propria, margins uninvolved, no lymphovascular or perineural invasion, 27 lymph nodes examined negative for metastasis (ypT2pN0). 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   # June 2017- colo Dr.Wohl     Rectal cancer ([place])   01/03/2014 Initial Diagnosis    Rectal cancer [facility] [place])       Oncology History   # 2015- RECTAL CA STAGE III-  s/p neoadjuvant radiation and concurrent 5FU chemotherapy completed end of July/early Aug 2015.  (initially diagnosed on 12/22/13 - upper GI showed an ulcer and colonoscopy showed a large rectal mass at the most proximal extent 4 cm from the anus). Then had Abdominoperineal resection, radical hysterectomy, vaginectomy with removal of tubes/ovaries with rectus flap myocutaneous vaginal reconstruction at [facility] on 04/12/14. Surgical pathology reports residual invasive rectal adenocarcinoma low-grade, tumor invades muscularis propria, margins uninvolved,  no lymphovascular or perineural invasion, 27 lymph nodes examined negative for metastasis (ypT2pN0). 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   # June 2017- colo Dr.Wohl     Rectal cancer ([place])   01/03/2014 Initial Diagnosis    Rectal cancer ([place])     INTERVAL HISTORY:  Tammie Robinson 51 y.o.  female pleasant patient above history of rectal cancer stage III- 2016 status post APR- is currently here for follow-up with her husband.  Patient continues to have chronic mild tingling in numbness in extremities. This is not any worse. Denies any bleeding. Patient noted to have mild bulge on the right side of the abdomen. This appears only on standing. Denies any constipation. Denies any blood in stools. Unfortunately she continues to gain weight.   REVIEW OF SYSTEMS:  A complete 10 point review of system is done which is negative except mentioned above/history of present illness.   PAST MEDICAL HISTORY :  Past Medical History:  Diagnosis Date  . Anemia    prior to CA dx  . Asthma    seasonal,never hospitalized  . Blood in stool   . Colostomy in place [facility])   . History of ERCP   . Hypertension   . Migraine   . Neuropathy ([place])    feet and hands due to chemo  . PTSD (post-traumatic stress disorder)   . Rectal cancer ([place])   . Wears contact lenses     PAST SURGICAL HISTORY :   Past Surgical History:  Procedure Laterality Date  . ABDOMINAL HYSTERECTOMY  04/12/14   [facility] with CA surgery  . CHOLECYSTECTOMY  2012   Dr. Jamal Collin  . COLONOSCOPY WITH PROPOFOL N/A 01/26/2016   Procedure: COLONOSCOPY WITH PROPOFOL  through colostomy;  Surgeon: Lucilla Lame, MD;  Location: [place];  Service: Endoscopy;  Laterality: N/A;  port-a-cath  . POLYPECTOMY  01/26/2016   Procedure: POLYPECTOMY;  Surgeon: Lucilla Lame, MD;  Location: [place];  Service: Endoscopy;;  . PORTACATH PLACEMENT    . PROCTECTOMY  04/12/14   [facility] - CA surgery - with colostomy  .  SALPINGOOPHORECTOMY Bilateral 04/12/14   [facility] - with CA surg  . VAGINA RECONSTRUCTION SURGERY  04/12/14   [facility] - transabdonimal rectus abdominus muscle reconstruction  . VAGINAL DELIVERY     2, preterm labor    FAMILY HISTORY :   Family History  Problem Relation Age of Onset  . Arthritis Mother   . Stroke Mother   . Hypertension Mother   . Diabetes Mother   . Arthritis Father   . Heart disease Father 13       s/p CABG  . Stroke Father   . Hypertension Father   . Alcohol abuse Maternal Grandmother   . Diabetes Maternal Grandmother   . Alcohol abuse Maternal Grandfather   . Diabetes Maternal Grandfather   . Cancer Paternal Grandmother        breast  . Heart disease Paternal Grandfather   . Breast cancer Paternal Aunt 88  . Colon cancer Neg Hx     SOCIAL HISTORY:   Social History  Substance Use Topics  . Smoking status: Never Smoker  . Smokeless tobacco: Never Used  . Alcohol use No     Comment: rare - holidays    ALLERGIES:  is allergic to lisinopril.  MEDICATIONS:  Current Outpatient Prescriptions  Medication Sig Dispense Refill  . ALPRAZolam (XANAX) 0.25 MG tablet Take 1 tablet (0.25 mg total) by mouth at bedtime as needed for anxiety. 30 tablet 1  . amLODipine (NORVASC) 10 MG tablet Take 1 tablet (10 mg total) by mouth daily. 90 tablet 2  . ferrous sulfate 325 (65 FE) MG tablet Take by mouth 2 (two) times daily with a meal.     . FLUoxetine (PROZAC) 40 MG capsule Take 1 capsule (40 mg total) by mouth daily. 90 capsule 1  . gabapentin (NEURONTIN) 300 MG capsule Take 1 pill three times day; Take 1 extra pill at night. 120 capsule 6  . acetaminophen (TYLENOL) 325 MG tablet Take by mouth.    Marland Kitchen albuterol (PROVENTIL HFA;VENTOLIN HFA) 108 (90 BASE) MCG/ACT inhaler Inhale 1-2 puffs into the lungs every 4 (four) hours as needed for wheezing or shortness of breath. (Patient not taking: Reported on 01/15/2017) 1 Inhaler 3  . lidocaine-prilocaine (EMLA) cream   0  .  prochlorperazine (COMPAZINE) 10 MG tablet   0   No current facility-administered medications for this visit.    Facility-Administered Medications Ordered in Other Visits  Medication Dose Route Frequency Provider Last Rate Last Dose  . heparin lock flush 100 unit/mL  500 Units Intravenous Once Leia Alf, MD      . sodium chloride 0.9 % injection 10 mL  10 mL Intravenous PRN Ma Hillock, Sandeep, MD      . sodium chloride 0.9 % injection 10 mL  10 mL Intravenous PRN Leia Alf, MD   10 mL at 11/28/14 1120  . sodium chloride flush (NS) 0.9 % injection 10 mL  10 mL Intravenous PRN Evlyn Kanner, NP   10 mL at 01/03/16 1413  . sodium chloride flush (NS) 0.9 % injection 10 mL  10 mL Intravenous PRN Lloyd Huger, MD  10 mL at 11/15/16 1111  . sodium chloride flush (NS) 0.9 % injection 10 mL  10 mL Intravenous PRN Cammie Sickle, MD   10 mL at 01/15/17 1040    PHYSICAL EXAMINATION: ECOG PERFORMANCE STATUS: 0 - Asymptomatic  BP 125/83 (BP Location: Left Arm, Patient Position: Sitting)   Pulse 64   Temp 97.8 F (36.6 C) (Tympanic)   Resp 18   Ht 5\' 5"  (1.651 m)   Wt 240 lb (108.9 kg)   LMP 12/11/2013   BMI 39.94 kg/m   Filed Weights   01/15/17 1047  Weight: 240 lb (108.9 kg)    GENERAL: Well-nourished well-developed; Alert, no distress and comfortable.   With her husband. EYES: no pallor or icterus OROPHARYNX: no thrush or ulceration; good dentition  NECK: supple, no masses felt LYMPH:  no palpable lymphadenopathy in the cervical, axillary or inguinal regions LUNGS: clear to auscultation and  No wheeze or crackles HEART/CVS: regular rate & rhythm and no murmurs; No lower extremity edema; positive for colostomy. ABDOMEN:abdomen soft, non-tender and normal bowel sounds Musculoskeletal:no cyanosis of digits and no clubbing  PSYCH: alert & oriented x 3 with fluent speech NEURO: no focal motor/sensory deficits SKIN:  no rashes or significant lesions  LABORATORY  DATA:  I have reviewed the data as listed    Component Value Date/Time   NA 138 01/15/2017 1028   NA 144 08/29/2014 0908   K 3.9 01/15/2017 1028   K 3.3 (L) 08/29/2014 0908   CL 105 01/15/2017 1028   CL 107 08/29/2014 0908   CO2 25 01/15/2017 1028   CO2 29 08/29/2014 0908   GLUCOSE 108 (H) 01/15/2017 1028   GLUCOSE 88 08/29/2014 0908   BUN 14 01/15/2017 1028   BUN 18 08/29/2014 0908   CREATININE 0.75 01/15/2017 1028   CREATININE 0.52 11/07/2014 0902   CALCIUM 8.7 (L) 01/15/2017 1028   CALCIUM 8.4 (L) 08/29/2014 0908   PROT 7.3 01/15/2017 1028   PROT 6.8 11/07/2014 0902   ALBUMIN 4.1 01/15/2017 1028   ALBUMIN 3.5 11/07/2014 0902   AST 38 01/15/2017 1028   AST 41 11/07/2014 0902   ALT 53 01/15/2017 1028   ALT 31 11/07/2014 0902   ALKPHOS 115 01/15/2017 1028   ALKPHOS 137 (H) 11/07/2014 0902   BILITOT 0.7 01/15/2017 1028   BILITOT 0.8 11/07/2014 0902   GFRNONAA >60 01/15/2017 1028   GFRNONAA >60 11/07/2014 0902   GFRAA >60 01/15/2017 1028   GFRAA >60 11/07/2014 0902    No results found for: SPEP, UPEP  Lab Results  Component Value Date   WBC 4.4 01/15/2017   NEUTROABS 3.1 01/15/2017   HGB 13.5 01/15/2017   HCT 39.5 01/15/2017   MCV 82.5 01/15/2017   PLT 196 01/15/2017      Chemistry      Component Value Date/Time   NA 138 01/15/2017 1028   NA 144 08/29/2014 0908   K 3.9 01/15/2017 1028   K 3.3 (L) 08/29/2014 0908   CL 105 01/15/2017 1028   CL 107 08/29/2014 0908   CO2 25 01/15/2017 1028   CO2 29 08/29/2014 0908   BUN 14 01/15/2017 1028   BUN 18 08/29/2014 0908   CREATININE 0.75 01/15/2017 1028   CREATININE 0.52 11/07/2014 0902      Component Value Date/Time   CALCIUM 8.7 (L) 01/15/2017 1028   CALCIUM 8.4 (L) 08/29/2014 0908   ALKPHOS 115 01/15/2017 1028   ALKPHOS 137 (H) 11/07/2014 0902   AST 38  01/15/2017 1028   AST 41 11/07/2014 0902   ALT 53 01/15/2017 1028   ALT 31 11/07/2014 0902   BILITOT 0.7 01/15/2017 1028   BILITOT 0.8 11/07/2014 0902        RADIOGRAPHIC STUDIES: I have personally reviewed the radiological images as listed and agreed with the findings in the report. No results found.   ASSESSMENT & PLAN:  Rectal cancer ([place]) # Rectal cancer, clinical TXN2M0, at least stage III [2015]. Clinically NED.June 2017 unremarkable.   # Thrombocytopenia - secondary to chemotherapy.-resolved.  # Anterior abdominal distention- as per patient. Clinically no concerns today. Question ventral hernia. If worse recommend abdominal binder/referral to surgery.   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 continue neurontin [300 mg 3 times a day; one extra pill at nighttime; and  discussed regarding amitriptyline. Patient was to hold off at this time.   # port explantation- port flush q 56m; patient seen on explantation if the next CT scan is negative  # Obesity/weight gain- discussed the positive effects of weight loss exercise and healthy eating- in cutting of the risk of colon cancer recurrence. Made a referral to care program.   # follow up in 6 months-labs;CEA. CT scan prior.    Orders Placed This Encounter  Procedures  . CT CHEST W CONTRAST    Standing Status:   Future    Standing Expiration Date:   03/17/2018    Order Specific Question:   Reason for Exam (SYMPTOM  OR DIAGNOSIS REQUIRED)    Answer:   rectal cancer    Order Specific Question:   Is the patient pregnant?    Answer:   No    Order Specific Question:   Preferred imaging location?    Answer:   [facility]  . CT ABDOMEN PELVIS W CONTRAST    Standing Status:   Future    Standing Expiration Date:   04/16/2018    Order Specific Question:   Reason for Exam (SYMPTOM  OR DIAGNOSIS REQUIRED)    Answer:   rectal cancer    Order Specific Question:   Is the patient pregnant?    Answer:   No    Order Specific Question:   Preferred imaging location?    Answer:   [facility]  . Comprehensive metabolic panel    Standing Status:   Future    Standing Expiration Date:   01/15/2018  . CBC  with Differential    Standing Status:   Future    Standing Expiration Date:   01/15/2018  . CEA    Standing Status:   Future    Standing Expiration Date:   01/15/2018  . AMB REFERRAL TO [facility] CARE    Referral Priority:   Routine    Referral Type:   Consultation    Referral Reason:   Patient Preference    Referred to Provider:   Richard Miu, LCSW    Number of Visits Requested:   1   All questions were answered. The patient knows to call the clinic with any problems, questions or concerns.      Cammie Sickle, MD 01/17/2017 7:24 PM

## 2017-01-15 NOTE — Assessment & Plan Note (Addendum)
#   Rectal cancer, clinical TXN2M0, at least stage III [2015]. Clinically NED.June 2017 unremarkable.   # Thrombocytopenia - secondary to chemotherapy.-resolved.  # Anterior abdominal distention- as per patient. Clinically no concerns today. Question ventral hernia. If worse recommend abdominal binder/referral to surgery.   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 continue neurontin [300 mg 3 times a day; one extra pill at nighttime; and  discussed regarding amitriptyline. Patient was to hold off at this time.   # port explantation- port flush q 33m; patient seen on explantation if the next CT scan is negative  # Obesity/weight gain- discussed the positive effects of weight loss exercise and healthy eating- in cutting of the risk of colon cancer recurrence. Made a referral to care program.   # follow up in 6 months-labs;CEA. CT scan prior.

## 2017-01-16 LAB — CEA: CEA: 2.2 ng/mL (ref 0.0–4.7)

## 2017-01-27 ENCOUNTER — Ambulatory Visit (INDEPENDENT_AMBULATORY_CARE_PROVIDER_SITE_OTHER): Payer: Self-pay | Admitting: Family

## 2017-01-27 ENCOUNTER — Other Ambulatory Visit (HOSPITAL_COMMUNITY)
Admission: RE | Admit: 2017-01-27 | Discharge: 2017-01-27 | Disposition: A | Discharge status: Home / Self Care | Payer: Self-pay | Source: Ambulatory Visit | Attending: Family | Admitting: Family

## 2017-01-27 ENCOUNTER — Encounter: Payer: Self-pay | Admitting: Family

## 2017-01-27 VITALS — BP 120/80 | HR 72 | Temp 98.4°F | Ht 65.0 in | Wt 243.8 lb

## 2017-01-27 DIAGNOSIS — F329 Major depressive disorder, single episode, unspecified: Secondary | ICD-10-CM

## 2017-01-27 DIAGNOSIS — Z0000 Encounter for general adult medical examination without abnormal findings: Secondary | ICD-10-CM

## 2017-01-27 DIAGNOSIS — I10 Essential (primary) hypertension: Secondary | ICD-10-CM

## 2017-01-27 DIAGNOSIS — F419 Anxiety disorder, unspecified: Secondary | ICD-10-CM

## 2017-01-27 DIAGNOSIS — F32A Depression, unspecified: Secondary | ICD-10-CM

## 2017-01-27 LAB — HIV ANTIBODY (ROUTINE TESTING W REFLEX): HIV 1&2 Ab, 4th Generation: NONREACTIVE

## 2017-01-27 LAB — COMPREHENSIVE METABOLIC PANEL
ALBUMIN: 4.6 g/dL (ref 3.5–5.2)
ALT: 45 U/L — AB (ref 0–35)
AST: 28 U/L (ref 0–37)
Alkaline Phosphatase: 118 U/L — ABNORMAL HIGH (ref 39–117)
BILIRUBIN TOTAL: 0.7 mg/dL (ref 0.2–1.2)
BUN: 17 mg/dL (ref 6–23)
CALCIUM: 9.8 mg/dL (ref 8.4–10.5)
CO2: 28 mEq/L (ref 19–32)
CREATININE: 0.87 mg/dL (ref 0.40–1.20)
Chloride: 102 mEq/L (ref 96–112)
GFR: 72.79 mL/min (ref >60.00)
Glucose, Bld: 109 mg/dL — ABNORMAL HIGH (ref 70–99)
Potassium: 4.2 mEq/L (ref 3.5–5.1)
Sodium: 138 mEq/L (ref 135–145)
Total Protein: 7.2 g/dL (ref 6.0–8.3)

## 2017-01-27 LAB — LIPID PANEL
CHOL/HDL RATIO: 4
Cholesterol: 178 mg/dL (ref 0–200)
HDL: 44.5 mg/dL (ref >39.00)
LDL Cholesterol: 96 mg/dL (ref 0–99)
NonHDL: 133.15
Triglycerides: 185 mg/dL — ABNORMAL HIGH (ref 0.0–149.0)
VLDL: 37 mg/dL (ref 0.0–40.0)

## 2017-01-27 LAB — HEMOGLOBIN A1C: Hgb A1c MFr Bld: 5.4 % (ref 4.6–6.5)

## 2017-01-27 LAB — MAGNESIUM: Magnesium: 1.7 mg/dL (ref 1.5–2.5)

## 2017-01-27 LAB — VITAMIN D 25 HYDROXY (VIT D DEFICIENCY, FRACTURES): VITD: 12.71 ng/mL — AB (ref 30.00–100.00)

## 2017-01-27 LAB — TSH: TSH: 2.22 u[IU]/mL (ref 0.35–4.50)

## 2017-01-27 NOTE — Assessment & Plan Note (Signed)
Appears chronic, mild since 2017. Pending lab study. No medications appear to be contributory.

## 2017-01-27 NOTE — Assessment & Plan Note (Signed)
Doing well on Prozac, xanax. Will continue.

## 2017-01-27 NOTE — Assessment & Plan Note (Signed)
Controlled. Continue current regimen. 

## 2017-01-27 NOTE — Progress Notes (Signed)
Subjective:    Patient ID: Tammie Robinson, female    DOB: [birth year], 51 y.o.   MRN: [id number]  CC: Tammie Robinson is a 51 y.o. female who presents today for physical exam.    HPI: HTN- compliant with medication. Denies exertional chest pain or pressure, numbness or tingling radiating to left arm or jaw, palpitations, dizziness, frequent headaches, changes in vision, or shortness of breath.   Depression- doing well  On prozax, xanax. no thoughts of hurting herself or anyone else.   No complaints today.    Colorectal Cancer Screening: UTD , dr Allen Law, 2017; repeat 3 years. Breast Cancer Screening: Mammogram due Cervical Cancer Screening: due; h/o hysterectomy. States ovaries, cerviv removed.  Done with rectal cancer resection. No metastasis per patient of rectal cancer.  Bone Health screening/DEXA for 65+: No increased fracture risk. Defer screening at this time Lung Cancer Screening: Doesn't have 30 year pack year history and age > 59 years.       Tetanus - utd         HIV Screening- Candidate for , consents Labs: Screening labs today.WBC done and reviewed 6/27. Exercise: Gets regular exercise.  Alcohol use: rare Smoking/tobacco use: Nonsmoker.  Regular dental exams: UTD Wears seat belt: Yes. Skin: no new skin lesions.   HISTORY:  Past Medical History:  Diagnosis Date  . Anemia    prior to CA dx  . Asthma    seasonal,never hospitalized  . Blood in stool   . Colostomy in place [facility])   . History of ERCP   . Hypertension   . Migraine   . 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 PTSD (post-traumatic stress disorder)   . Rectal cancer ([place])   . Wears contact lenses     Past Surgical History:  Procedure Laterality Date  . ABDOMINAL HYSTERECTOMY  04/12/14   [facility] with CA surgery  . CHOLECYSTECTOMY  2012   Dr. Jamal Collin  . COLONOSCOPY WITH PROPOFOL N/A 01/26/2016   Procedure: COLONOSCOPY WITH PROPOFOL through colostomy;  Surgeon: Lucilla Lame, MD;  Location: [place];  Service: Endoscopy;  Laterality: N/A;  port-a-cath  . POLYPECTOMY  01/26/2016   Procedure: POLYPECTOMY;  Surgeon: Lucilla Lame, MD;  Location: [place];  Service: Endoscopy;;  . PORTACATH PLACEMENT    . PROCTECTOMY  04/12/14   [facility] - CA surgery - with colostomy  . SALPINGOOPHORECTOMY Bilateral 04/12/14   [facility] - with CA surg  . VAGINA RECONSTRUCTION SURGERY  04/12/14   [facility] - transabdonimal rectus abdominus muscle reconstruction  . VAGINAL DELIVERY     2, preterm labor   Family History  Problem Relation Age of Onset  . Arthritis Mother   . Stroke Mother   . Hypertension Mother   . Diabetes Mother   . Arthritis Father   . Heart disease Father 60       s/p CABG  . Stroke Father   . Hypertension Father   . Alcohol abuse Maternal Grandmother   . Diabetes Maternal Grandmother   . Alcohol abuse Maternal Grandfather   . Diabetes Maternal Grandfather   . Cancer Paternal Grandmother        breast  . Heart disease Paternal Grandfather   . Breast cancer Paternal Aunt 78  . Colon cancer Neg Hx       ALLERGIES: Lisinopril  Current Outpatient Prescriptions on File Prior to Visit  Medication Sig Dispense Refill  . acetaminophen (TYLENOL) 325 MG  tablet Take by mouth.    Marland Kitchen albuterol (PROVENTIL HFA;VENTOLIN HFA) 108 (90 BASE) MCG/ACT inhaler Inhale 1-2 puffs into the lungs every 4 (four) hours as needed for wheezing or shortness of breath. (Patient not taking: Reported on 01/15/2017) 1 Inhaler 3  . ALPRAZolam (XANAX) 0.25 MG tablet Take 1 tablet (0.25 mg total) by mouth at bedtime as needed for anxiety. 30 tablet 1  . amLODipine (NORVASC) 10 MG tablet Take 1 tablet (10 mg total) by mouth daily. 90 tablet 2  . ferrous sulfate 325 (65 FE) MG tablet Take by mouth 2 (two) times daily with a meal.     . FLUoxetine (PROZAC) 40 MG capsule Take 1 capsule (40 mg total) by mouth daily. 90 capsule 1  . gabapentin (NEURONTIN) 300 MG capsule Take 1 pill three times day; Take 1 extra  pill at night. 120 capsule 6  . lidocaine-prilocaine (EMLA) cream   0   Current Facility-Administered Medications on File Prior to Visit  Medication Dose Route Frequency Provider Last Rate Last Dose  . heparin lock flush 100 unit/mL  500 Units Intravenous Once Leia Alf, MD      . sodium chloride 0.9 % injection 10 mL  10 mL Intravenous PRN Leia Alf, MD      . sodium chloride 0.9 % injection 10 mL  10 mL Intravenous PRN Leia Alf, MD   10 mL at 11/28/14 1120  . sodium chloride flush (NS) 0.9 % injection 10 mL  10 mL Intravenous PRN Evlyn Kanner, NP   10 mL at 01/03/16 1413  . sodium chloride flush (NS) 0.9 % injection 10 mL  10 mL Intravenous PRN Lloyd Huger, MD   10 mL at 11/15/16 1111  . sodium chloride flush (NS) 0.9 % injection 10 mL  10 mL Intravenous PRN Cammie Sickle, MD   10 mL at 01/15/17 1040    Social History  Substance Use Topics  . Smoking status: Never Smoker  . Smokeless tobacco: Never Used  . Alcohol use No     Comment: rare - holidays    Review of Systems  Constitutional: Negative for chills, fever and unexpected weight change.  HENT: Negative for congestion.   Respiratory: Negative for cough.   Cardiovascular: Negative for chest pain, palpitations and leg swelling.  Gastrointestinal: Negative for nausea and vomiting.  Musculoskeletal: Negative for arthralgias and myalgias.  Skin: Negative for rash.  Neurological: Negative for headaches.  Hematological: Negative for adenopathy.  Psychiatric/Behavioral: Negative for confusion.      Objective:    BP 120/80   Pulse 72   Temp 98.4 F (36.9 C) (Oral)   Ht 5\' 5"  (1.651 m)   Wt 243 lb 12.8 oz (110.6 kg)   LMP 12/11/2013   SpO2 97%   BMI 40.57 kg/m   BP Readings from Last 3 Encounters:  01/27/17 120/80  01/15/17 125/83  09/11/16 130/84   Wt Readings from Last 3 Encounters:  01/27/17 243 lb 12.8 oz (110.6 kg)  01/15/17 240 lb (108.9 kg)  09/11/16 237 lb (107.5 kg)      Physical Exam  Constitutional: She appears well-developed and well-nourished.  Eyes: Conjunctivae are normal.  Neck: No thyroid mass and no thyromegaly present.  Cardiovascular: Normal rate, regular rhythm, normal heart sounds and normal pulses.   Pulmonary/Chest: Effort normal and breath sounds normal. She has no wheezes. She has no rhonchi. She has no rales. Right breast exhibits no inverted nipple, no mass, no nipple discharge, no  skin change and no tenderness. Left breast exhibits no inverted nipple, no mass, no nipple discharge, no skin change and no tenderness. Breasts are symmetrical.  No masses or asymmetry appreciated during CBE.  Genitourinary: Right adnexum displays no mass, no tenderness and no fullness. Left adnexum displays no mass, no tenderness and no fullness.  Genitourinary Comments: Pap performed.  Unable to appreciated ovaries or cervix.   Lymphadenopathy:       Head (right side): No submental, no submandibular, no tonsillar, no preauricular, no posterior auricular and no occipital adenopathy present.       Head (left side): No submental, no submandibular, no tonsillar, no preauricular, no posterior auricular and no occipital adenopathy present.       Right cervical: No superficial cervical, no deep cervical and no posterior cervical adenopathy present.      Left cervical: No superficial cervical, no deep cervical and no posterior cervical adenopathy present.    She has no axillary adenopathy.       Right axillary: No pectoral and no lateral adenopathy present.       Left axillary: No pectoral and no lateral adenopathy present. Neurological: She is alert.  Skin: Skin is warm and dry.  Psychiatric: She has a normal mood and affect. Her speech is normal and behavior is normal. Thought content normal.  Vitals reviewed.      Assessment & Plan:   Problem List Items Addressed This Visit      Cardiovascular and Mediastinum   Hypertension    Controlled. Continue  current regimen.        Other   Anxiety and depression    Doing well on Prozac, xanax. Will continue.      General medical exam - Primary    Due for mammogram and patient understands to schedule. Up-to-date colonoscopy. We jointly agreed to perform pelvic exam as patient was unsure exactly if  the cervix was taken with hysterectomy. Also in the context of a rectal cancer, we decided a Pap was appropriate. During pelvic exam, I did NOT appreciate a cervix. We agreed at the following physical, we would have a conversation as to whether or not we would continue doing Pap smear of vaginal cavity. Screening labs ordered. Referral to dermatology for annual. Encouraged exercise.       Relevant Orders   PTH, intact and calcium   Comprehensive metabolic panel   VITAMIN D 25 Hydroxy (Vit-D Deficiency, Fractures)   TSH   Lipid panel   HIV antibody   Hemoglobin A1c   MM SCREENING BREAST TOMO BILATERAL   Magnesium   Cytology - PAP   Ambulatory referral to Dermatology   Hypocalcemia    Appears chronic, mild since 2017. Pending lab study. No medications appear to be contributory.      Relevant Orders   PTH, intact and calcium   VITAMIN D 25 Hydroxy (Vit-D Deficiency, Fractures)   Magnesium       I have discontinued Ms. Besaw's prochlorperazine. I am also having her maintain her acetaminophen, ferrous sulfate, lidocaine-prilocaine, albuterol, gabapentin, FLUoxetine, amLODipine, and ALPRAZolam.   No orders of the defined types were placed in this encounter.   Return precautions given.   Risks, benefits, and alternatives of the medications and treatment plan prescribed today were discussed, and patient expressed understanding.   Education regarding symptom management and diagnosis given to patient on AVS.   Continue to follow with Burnard Hawthorne, FNP for routine health maintenance.   Lenard Simmer and I  agreed with plan.   [name], FNP

## 2017-01-27 NOTE — Assessment & Plan Note (Addendum)
Due for mammogram and patient understands to schedule. Up-to-date colonoscopy. We jointly agreed to perform pelvic exam as patient was unsure exactly if  the cervix was taken with hysterectomy. Also in the context of a rectal cancer, we decided a Pap was appropriate. During pelvic exam, I did NOT appreciate a cervix. We agreed at the following physical, we would have a conversation as to whether or not we would continue doing Pap smear of vaginal cavity. Screening labs ordered. Referral to dermatology for annual. Encouraged exercise.

## 2017-01-27 NOTE — Progress Notes (Signed)
Pre visit review using our clinic review tool, if applicable. No additional management support is needed unless otherwise documented below in the visit note. 

## 2017-01-27 NOTE — Patient Instructions (Signed)
Labs  Referral to derm   We placed a referral. Mammogram this year. I asked that you call one the below locations and schedule this when it is convenient for you.   If you have dense breasts, you may ask for 3D mammogram over the traditional 2D mammogram as new evidence suggest 3D is superior. Please note that NOT all insurance companies cover 3D and you may have to pay a higher copay. You may call your insurance company to further clarify your benefits.   Options for [place]  [place], [place]  * [facility] [place], [place] * Note if you ask for 3D mammogram at this location, you must request [place], [place] location*

## 2017-01-28 LAB — PTH, INTACT AND CALCIUM
Calcium: 9.5 mg/dL (ref 8.6–10.4)
PTH: 71 pg/mL — ABNORMAL HIGH (ref 14–64)

## 2017-01-29 LAB — CYTOLOGY - PAP: HPV (WINDOPATH): NOT DETECTED

## 2017-02-03 ENCOUNTER — Other Ambulatory Visit: Payer: Self-pay | Admitting: Family

## 2017-02-03 DIAGNOSIS — F419 Anxiety disorder, unspecified: Principal | ICD-10-CM

## 2017-02-03 DIAGNOSIS — F329 Major depressive disorder, single episode, unspecified: Secondary | ICD-10-CM

## 2017-02-06 ENCOUNTER — Other Ambulatory Visit: Payer: Self-pay | Admitting: Family

## 2017-02-06 ENCOUNTER — Encounter: Payer: Self-pay | Admitting: Family

## 2017-02-06 DIAGNOSIS — R7989 Other specified abnormal findings of blood chemistry: Secondary | ICD-10-CM

## 2017-02-26 ENCOUNTER — Other Ambulatory Visit: Payer: Self-pay | Admitting: Internal Medicine

## 2017-02-26 ENCOUNTER — Other Ambulatory Visit: Payer: Self-pay | Admitting: Family

## 2017-02-26 ENCOUNTER — Inpatient Hospital Stay: Payer: Self-pay | Attending: Internal Medicine

## 2017-02-26 ENCOUNTER — Telehealth: Payer: Self-pay | Admitting: Family

## 2017-02-26 DIAGNOSIS — F419 Anxiety disorder, unspecified: Principal | ICD-10-CM

## 2017-02-26 DIAGNOSIS — C20 Malignant neoplasm of rectum: Secondary | ICD-10-CM | POA: Insufficient documentation

## 2017-02-26 DIAGNOSIS — F32A Depression, unspecified: Secondary | ICD-10-CM

## 2017-02-26 DIAGNOSIS — Z452 Encounter for adjustment and management of vascular access device: Secondary | ICD-10-CM | POA: Insufficient documentation

## 2017-02-26 DIAGNOSIS — F329 Major depressive disorder, single episode, unspecified: Secondary | ICD-10-CM

## 2017-02-26 NOTE — Telephone Encounter (Signed)
I looked up patient on [place] Controlled Substances Reporting System and saw no activity that raised concern of inappropriate use.  Refilled xanax

## 2017-03-04 ENCOUNTER — Inpatient Hospital Stay: Payer: Self-pay

## 2017-03-04 DIAGNOSIS — Z95828 Presence of other vascular implants and grafts: Secondary | ICD-10-CM

## 2017-03-04 MED ORDER — SODIUM CHLORIDE 0.9% FLUSH
10.0000 mL | INTRAVENOUS | Status: DC | PRN
  Administered 2017-03-04: 10 mL via INTRAVENOUS
  Filled 2017-03-04: qty 10

## 2017-03-04 MED ORDER — HEPARIN SOD (PORK) LOCK FLUSH 100 UNIT/ML IV SOLN
500.0000 [IU] | Freq: Once | INTRAVENOUS | Status: AC
  Administered 2017-03-04: 500 [IU] via INTRAVENOUS

## 2017-04-08 ENCOUNTER — Ambulatory Visit: Payer: Self-pay

## 2017-04-09 ENCOUNTER — Inpatient Hospital Stay: Payer: Self-pay | Attending: Internal Medicine

## 2017-04-09 DIAGNOSIS — C20 Malignant neoplasm of rectum: Secondary | ICD-10-CM | POA: Insufficient documentation

## 2017-04-09 DIAGNOSIS — Z452 Encounter for adjustment and management of vascular access device: Secondary | ICD-10-CM | POA: Insufficient documentation

## 2017-04-09 DIAGNOSIS — Z95828 Presence of other vascular implants and grafts: Secondary | ICD-10-CM

## 2017-04-09 MED ORDER — SODIUM CHLORIDE 0.9% FLUSH
10.0000 mL | INTRAVENOUS | Status: AC | PRN
  Administered 2017-04-09: 10 mL
  Filled 2017-04-09: qty 10

## 2017-04-09 MED ORDER — HEPARIN SOD (PORK) LOCK FLUSH 100 UNIT/ML IV SOLN
500.0000 [IU] | INTRAVENOUS | Status: AC | PRN
  Administered 2017-04-09: 500 [IU]

## 2017-04-27 ENCOUNTER — Other Ambulatory Visit: Payer: Self-pay | Admitting: Family

## 2017-04-27 DIAGNOSIS — F329 Major depressive disorder, single episode, unspecified: Secondary | ICD-10-CM

## 2017-04-27 DIAGNOSIS — F419 Anxiety disorder, unspecified: Principal | ICD-10-CM

## 2017-04-28 NOTE — Telephone Encounter (Signed)
Please advise for refills, last OV was 79/18, last refill was 02/26/17, #30 with 1 refill

## 2017-04-30 ENCOUNTER — Telehealth: Payer: Self-pay | Admitting: Family

## 2017-04-30 DIAGNOSIS — F329 Major depressive disorder, single episode, unspecified: Secondary | ICD-10-CM

## 2017-04-30 DIAGNOSIS — F419 Anxiety disorder, unspecified: Principal | ICD-10-CM

## 2017-04-30 DIAGNOSIS — F32A Depression, unspecified: Secondary | ICD-10-CM

## 2017-04-30 MED ORDER — ALPRAZOLAM 0.25 MG PO TABS: ORAL_TABLET | ORAL | 1 refills | Status: DC

## 2017-04-30 NOTE — Telephone Encounter (Signed)
I looked up patient on [place] Controlled Substances Reporting System and saw no activity that raised concern of inappropriate use.   

## 2017-05-09 ENCOUNTER — Other Ambulatory Visit: Payer: Self-pay | Admitting: Family

## 2017-05-09 DIAGNOSIS — I10 Essential (primary) hypertension: Secondary | ICD-10-CM

## 2017-06-04 ENCOUNTER — Inpatient Hospital Stay: Payer: Self-pay

## 2017-06-05 ENCOUNTER — Inpatient Hospital Stay: Payer: Self-pay | Attending: Internal Medicine

## 2017-06-05 DIAGNOSIS — Z452 Encounter for adjustment and management of vascular access device: Secondary | ICD-10-CM | POA: Insufficient documentation

## 2017-06-05 DIAGNOSIS — C20 Malignant neoplasm of rectum: Secondary | ICD-10-CM | POA: Insufficient documentation

## 2017-06-05 DIAGNOSIS — Z95828 Presence of other vascular implants and grafts: Secondary | ICD-10-CM

## 2017-06-05 MED ORDER — SODIUM CHLORIDE 0.9% FLUSH
10.0000 mL | INTRAVENOUS | Status: AC | PRN
  Administered 2017-06-05: 10 mL
  Filled 2017-06-05: qty 10

## 2017-06-05 MED ORDER — HEPARIN SOD (PORK) LOCK FLUSH 100 UNIT/ML IV SOLN
500.0000 [IU] | INTRAVENOUS | Status: AC | PRN
  Administered 2017-06-05: 500 [IU]

## 2017-06-29 ENCOUNTER — Other Ambulatory Visit: Payer: Self-pay | Admitting: Family

## 2017-06-29 DIAGNOSIS — F329 Major depressive disorder, single episode, unspecified: Secondary | ICD-10-CM

## 2017-06-29 DIAGNOSIS — F419 Anxiety disorder, unspecified: Principal | ICD-10-CM

## 2017-07-03 NOTE — Telephone Encounter (Signed)
Refill request for Xanax, last seen [id number], last filled 10OCT18.  Please advise.

## 2017-07-04 NOTE — Telephone Encounter (Signed)
Printed, signed and faxed.  

## 2017-07-04 NOTE — Telephone Encounter (Signed)
Refilled for 3 months  Need OV

## 2017-07-18 ENCOUNTER — Other Ambulatory Visit: Payer: Self-pay | Admitting: Internal Medicine

## 2017-07-18 ENCOUNTER — Ambulatory Visit
Admission: RE | Admit: 2017-07-18 | Discharge: 2017-07-18 | Disposition: A | Discharge status: Home / Self Care | Payer: Self-pay | Source: Ambulatory Visit | Attending: Internal Medicine | Admitting: Internal Medicine

## 2017-07-18 ENCOUNTER — Inpatient Hospital Stay: Payer: Self-pay | Attending: Internal Medicine

## 2017-07-18 DIAGNOSIS — K7689 Other specified diseases of liver: Secondary | ICD-10-CM | POA: Insufficient documentation

## 2017-07-18 DIAGNOSIS — C20 Malignant neoplasm of rectum: Secondary | ICD-10-CM | POA: Insufficient documentation

## 2017-07-18 LAB — CBC WITH DIFFERENTIAL/PLATELET
BASOS ABS: 0 10*3/uL (ref 0–0.1)
Basophils Relative: 1 %
EOS ABS: 0.2 10*3/uL (ref 0–0.7)
EOS PCT: 4 %
HCT: 39 % (ref 35.0–47.0)
Hemoglobin: 13 g/dL (ref 12.0–16.0)
Lymphocytes Relative: 21 %
Lymphs Abs: 1 10*3/uL (ref 1.0–3.6)
MCH: 28 pg (ref 26.0–34.0)
MCHC: 33.5 g/dL (ref 32.0–36.0)
MCV: 83.6 fL (ref 80.0–100.0)
MONO ABS: 0.4 10*3/uL (ref 0.2–0.9)
Monocytes Relative: 8 %
Neutro Abs: 3.1 10*3/uL (ref 1.4–6.5)
Neutrophils Relative %: 66 %
PLATELETS: 184 10*3/uL (ref 150–440)
RBC: 4.66 MIL/uL (ref 3.80–5.20)
RDW: 13.7 % (ref 11.5–14.5)
WBC: 4.7 10*3/uL (ref 3.6–11.0)

## 2017-07-18 LAB — COMPREHENSIVE METABOLIC PANEL
ALT: 45 U/L (ref 14–54)
AST: 39 U/L (ref 15–41)
Albumin: 3.9 g/dL (ref 3.5–5.0)
Alkaline Phosphatase: 96 U/L (ref 38–126)
Anion gap: 8 (ref 5–15)
BUN: 14 mg/dL (ref 6–20)
CHLORIDE: 104 mmol/L (ref 101–111)
CO2: 25 mmol/L (ref 22–32)
CREATININE: 0.69 mg/dL (ref 0.44–1.00)
Calcium: 8.6 mg/dL — ABNORMAL LOW (ref 8.9–10.3)
GFR calc non Af Amer: >60 mL/min (ref >60)
Glucose, Bld: 109 mg/dL — ABNORMAL HIGH (ref 65–99)
POTASSIUM: 3.9 mmol/L (ref 3.5–5.1)
SODIUM: 137 mmol/L (ref 135–145)
Total Bilirubin: 0.5 mg/dL (ref 0.3–1.2)
Total Protein: 7.2 g/dL (ref 6.5–8.1)

## 2017-07-18 MED ORDER — HEPARIN SOD (PORK) LOCK FLUSH 100 UNIT/ML IV SOLN
500.0000 [IU] | Freq: Once | INTRAVENOUS | Status: AC
  Administered 2017-07-18: 500 [IU] via INTRAVENOUS

## 2017-07-18 MED ORDER — IOPAMIDOL (ISOVUE-300) INJECTION 61%
100.0000 mL | Freq: Once | INTRAVENOUS | Status: AC | PRN
  Administered 2017-07-18: 100 mL via INTRAVENOUS

## 2017-07-18 MED ORDER — SODIUM CHLORIDE 0.9% FLUSH
10.0000 mL | Freq: Once | INTRAVENOUS | Status: AC
  Administered 2017-07-18: 10 mL via INTRAVENOUS
  Filled 2017-07-18: qty 10

## 2017-07-18 NOTE — Progress Notes (Signed)
FYI

## 2017-07-19 LAB — CEA: CEA: 2.5 ng/mL (ref 0.0–4.7)

## 2017-07-28 ENCOUNTER — Other Ambulatory Visit: Payer: Self-pay

## 2017-07-28 ENCOUNTER — Inpatient Hospital Stay: Payer: Self-pay | Attending: Internal Medicine | Admitting: Internal Medicine

## 2017-07-28 ENCOUNTER — Encounter: Payer: Self-pay | Admitting: Internal Medicine

## 2017-07-28 VITALS — BP 156/84 | HR 73 | Temp 97.5°F | Resp 18 | Ht 65.0 in | Wt 244.0 lb

## 2017-07-28 DIAGNOSIS — I10 Essential (primary) hypertension: Secondary | ICD-10-CM | POA: Insufficient documentation

## 2017-07-28 DIAGNOSIS — Z803 Family history of malignant neoplasm of breast: Secondary | ICD-10-CM | POA: Insufficient documentation

## 2017-07-28 DIAGNOSIS — D649 Anemia, unspecified: Secondary | ICD-10-CM | POA: Insufficient documentation

## 2017-07-28 DIAGNOSIS — Z90722 Acquired absence of ovaries, bilateral: Secondary | ICD-10-CM | POA: Insufficient documentation

## 2017-07-28 DIAGNOSIS — Z9221 Personal history of antineoplastic chemotherapy: Secondary | ICD-10-CM | POA: Insufficient documentation

## 2017-07-28 DIAGNOSIS — Z85048 Personal history of other malignant neoplasm of rectum, rectosigmoid junction, and anus: Secondary | ICD-10-CM | POA: Insufficient documentation

## 2017-07-28 DIAGNOSIS — E669 Obesity, unspecified: Secondary | ICD-10-CM | POA: Insufficient documentation

## 2017-07-28 DIAGNOSIS — Z933 Colostomy status: Secondary | ICD-10-CM | POA: Insufficient documentation

## 2017-07-28 DIAGNOSIS — Z90710 Acquired absence of both cervix and uterus: Secondary | ICD-10-CM | POA: Insufficient documentation

## 2017-07-28 DIAGNOSIS — C20 Malignant neoplasm of rectum: Secondary | ICD-10-CM

## 2017-07-28 DIAGNOSIS — Z79899 Other long term (current) drug therapy: Secondary | ICD-10-CM | POA: Insufficient documentation

## 2017-07-28 DIAGNOSIS — K7689 Other specified diseases of liver: Secondary | ICD-10-CM | POA: Insufficient documentation

## 2017-07-28 DIAGNOSIS — F4310 Post-traumatic stress disorder, unspecified: Secondary | ICD-10-CM | POA: Insufficient documentation

## 2017-07-28 DIAGNOSIS — J069 Acute upper respiratory infection, unspecified: Secondary | ICD-10-CM | POA: Insufficient documentation

## 2017-07-28 DIAGNOSIS — Z923 Personal history of irradiation: Secondary | ICD-10-CM | POA: Insufficient documentation

## 2017-07-28 DIAGNOSIS — Z9049 Acquired absence of other specified parts of digestive tract: Secondary | ICD-10-CM | POA: Insufficient documentation

## 2017-07-28 DIAGNOSIS — G629 Polyneuropathy, unspecified: Secondary | ICD-10-CM | POA: Insufficient documentation

## 2017-07-28 DIAGNOSIS — Z8669 Personal history of other diseases of the nervous system and sense organs: Secondary | ICD-10-CM | POA: Insufficient documentation

## 2017-07-28 DIAGNOSIS — K769 Liver disease, unspecified: Secondary | ICD-10-CM | POA: Insufficient documentation

## 2017-07-28 NOTE — Progress Notes (Signed)
[place] OFFICE PROGRESS NOTE  Patient Care Team: Burnard Hawthorne, FNP as PCP - General (Family Medicine) Rico Junker, RN as Registered Nurse Theodore Demark, RN as Registered Nurse  Cancer Staging Rectal cancer [facility]) Staging form: Colon and Rectum, AJCC 7th Edition - Clinical stage from 11/21/2014: Stage Unknown ([place], N2, M0) - Signed by Leia Alf, MD on 11/21/2014    Oncology History   # 2015- RECTAL CA STAGE III-  s/p neoadjuvant radiation and concurrent 5FU chemotherapy completed end of July/early Aug 2015.  (initially diagnosed on 12/22/13 - upper GI showed an ulcer and colonoscopy showed a large rectal mass at the most proximal extent 4 cm from the anus). Then had Abdominoperineal resection, radical hysterectomy, vaginectomy with removal of tubes/ovaries with rectus flap myocutaneous vaginal reconstruction at [facility] on 04/12/14. Surgical pathology reports residual invasive rectal adenocarcinoma low-grade, tumor invades muscularis propria, margins uninvolved, no lymphovascular or perineural invasion, 27 lymph nodes examined negative for metastasis (ypT2pN0). 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   # June 2017- colo Dr.Wohl     Rectal cancer ([place])   01/03/2014 Initial Diagnosis    Rectal cancer [facility])        Oncology History   # 2015- RECTAL CA STAGE III-  s/p neoadjuvant radiation and concurrent 5FU chemotherapy completed end of July/early Aug 2015.  (initially diagnosed on 12/22/13 - upper GI showed an ulcer and colonoscopy showed a large rectal mass at the most proximal extent 4 cm from the anus). Then had Abdominoperineal resection, radical hysterectomy, vaginectomy with removal of tubes/ovaries with rectus flap myocutaneous vaginal reconstruction at [facility] on 04/12/14. Surgical pathology reports residual invasive rectal adenocarcinoma low-grade, tumor invades muscularis propria, margins uninvolved,  no lymphovascular or perineural invasion, 27 lymph nodes examined negative for metastasis (ypT2pN0). 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   # June 2017- colo Dr.Wohl     Rectal cancer ([place])   01/03/2014 Initial Diagnosis    Rectal cancer ([place])      INTERVAL HISTORY:  Tammie Robinson 52 y.o.  female pleasant patient above history of rectal cancer stage III- 2016 status post APR- is currently here for follow-up with her husband/reviewed the results of the restaging CAT scan.  Patient complains of worsening tingling and numbness of her bilateral lower extremities.  Patient is already on Neurontin.   She denies any abdominal pain.  Denies any chest pain or shortness of breath.  Unfortunately she continues to gain weight.   Patient complains of sore throat nasal congestion cough nonproductive in the last few days.  She has been using DayQuil/NyQuil.  Symptoms improving.  Not resolved.  REVIEW OF SYSTEMS:  A complete 10 point review of system is done which is negative except mentioned above/history of present illness.   PAST MEDICAL HISTORY :  Past Medical History:  Diagnosis Date  . Anemia    prior to CA dx  . Asthma    seasonal,never hospitalized  . Blood in stool   . Colostomy in place [facility])   . History of ERCP   . Hypertension   . Migraine   . 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 PTSD (post-traumatic stress disorder)   . Rectal cancer ([place])   . Wears contact lenses     PAST SURGICAL HISTORY :   Past Surgical History:  Procedure Laterality Date  . ABDOMINAL HYSTERECTOMY  04/12/2014   [facility]  with CA surgery; no cervix ( M.Arnett 01/2017)  . CHOLECYSTECTOMY  2012   Dr. Jamal Collin  . COLONOSCOPY WITH PROPOFOL N/A 01/26/2016   Procedure: COLONOSCOPY WITH PROPOFOL through colostomy;  Surgeon: Lucilla Lame, MD;  Location: [facility];  Service: Endoscopy;  Laterality: N/A;  port-a-cath  . POLYPECTOMY  01/26/2016   Procedure: POLYPECTOMY;  Surgeon:  Lucilla Lame, MD;  Location: [facility];  Service: Endoscopy;;  . PORTACATH PLACEMENT    . PROCTECTOMY  04/12/14   [facility] - CA surgery - with colostomy  . SALPINGOOPHORECTOMY Bilateral 04/12/14   [facility] - with CA surg  . VAGINA RECONSTRUCTION SURGERY  04/12/14   [facility] - transabdonimal rectus abdominus muscle reconstruction  . VAGINAL DELIVERY     2, preterm labor    FAMILY HISTORY :   Family History  Problem Relation Age of Onset  . Arthritis Mother   . Stroke Mother   . Hypertension Mother   . Diabetes Mother   . Arthritis Father   . Heart disease Father 24       s/p CABG  . Stroke Father   . Hypertension Father   . Alcohol abuse Maternal Grandmother   . Diabetes Maternal Grandmother   . Alcohol abuse Maternal Grandfather   . Diabetes Maternal Grandfather   . Cancer Paternal Grandmother        breast  . Heart disease Paternal Grandfather   . Breast cancer Paternal Aunt 6  . Colon cancer Neg Hx     SOCIAL HISTORY:   Social History   Tobacco Use  . Smoking status: Never Smoker  . Smokeless tobacco: Never Used  Substance Use Topics  . Alcohol use: No    Comment: rare - holidays  . Drug use: No    ALLERGIES:  is allergic to lisinopril.  MEDICATIONS:  Current Outpatient Medications  Medication Sig Dispense Refill  . albuterol (PROVENTIL HFA;VENTOLIN HFA) 108 (90 BASE) MCG/ACT inhaler Inhale 1-2 puffs into the lungs every 4 (four) hours as needed for wheezing or shortness of breath. 1 Inhaler 3  . ALPRAZolam (XANAX) 0.25 MG tablet TAKE ONE TABLET BY MOUTH AT BEDTIME AS NEEDED FOR ANXIETY 30 tablet 2  . amLODipine (NORVASC) 10 MG tablet TAKE ONE TABLET BY MOUTH EVERY DAY 90 tablet 4  . ferrous sulfate 325 (65 FE) MG tablet Take by mouth 2 (two) times daily with a meal.     . FLUoxetine (PROZAC) 40 MG capsule TAKE ONE CAPSULE BY MOUTH EVERY DAY 90 capsule 1  . gabapentin (NEURONTIN) 300 MG capsule TAKE ONE CAPSULE BY MOUTH THREE TIMES DAILY. TAKE ONE EXTRA CAPSULE  AT NIGHT 120 capsule 5  . acetaminophen (TYLENOL) 325 MG tablet Take 325 mg by mouth every 6 (six) hours as needed for mild pain.     Marland Kitchen lidocaine-prilocaine (EMLA) cream   0   No current facility-administered medications for this visit.    Facility-Administered Medications Ordered in Other Visits  Medication Dose Route Frequency Provider Last Rate Last Dose  . heparin lock flush 100 unit/mL  500 Units Intravenous Once Leia Alf, MD      . sodium chloride 0.9 % injection 10 mL  10 mL Intravenous PRN Ma Hillock, Sandeep, MD      . sodium chloride 0.9 % injection 10 mL  10 mL Intravenous PRN Leia Alf, MD   10 mL at 11/28/14 1120  . sodium chloride flush (NS) 0.9 % injection 10 mL  10 mL Intravenous PRN Evlyn Kanner,  NP   10 mL at 01/03/16 1413  . sodium chloride flush (NS) 0.9 % injection 10 mL  10 mL Intravenous PRN Lloyd Huger, MD   10 mL at 11/15/16 1111    PHYSICAL EXAMINATION: ECOG PERFORMANCE STATUS: 0 - Asymptomatic  BP (!) 156/84 (BP Location: Left Arm, Patient Position: Sitting)   Pulse 73   Temp (!) 97.5 F (36.4 C) (Tympanic)   Resp 18   Ht 5\' 5"  (1.651 m)   Wt 244 lb (110.7 kg)   LMP 12/11/2013   BMI 40.60 kg/m   Filed Weights   07/28/17 1345  Weight: 244 lb (110.7 kg)    GENERAL: Well-nourished well-developed; Alert, no distress and comfortable.   With her husband. EYES: no pallor or icterus OROPHARYNX: no thrush or ulceration; good dentition  NECK: supple, no masses felt LYMPH:  no palpable lymphadenopathy in the cervical, axillary or inguinal regions LUNGS: clear to auscultation and  No wheeze or crackles HEART/CVS: regular rate & rhythm and no murmurs; No lower extremity edema; positive for colostomy. ABDOMEN:abdomen soft, non-tender and normal bowel sounds Musculoskeletal:no cyanosis of digits and no clubbing  PSYCH: alert & oriented x 3 with fluent speech NEURO: no focal motor/sensory deficits SKIN:  no rashes or significant  lesions  LABORATORY DATA:  I have reviewed the data as listed    Component Value Date/Time   NA 137 07/18/2017 0830   NA 144 08/29/2014 0908   K 3.9 07/18/2017 0830   K 3.3 (L) 08/29/2014 0908   CL 104 07/18/2017 0830   CL 107 08/29/2014 0908   CO2 25 07/18/2017 0830   CO2 29 08/29/2014 0908   GLUCOSE 109 (H) 07/18/2017 0830   GLUCOSE 88 08/29/2014 0908   BUN 14 07/18/2017 0830   BUN 18 08/29/2014 0908   CREATININE 0.69 07/18/2017 0830   CREATININE 0.52 11/07/2014 0902   CALCIUM 8.6 (L) 07/18/2017 0830   CALCIUM 8.4 (L) 08/29/2014 0908   PROT 7.2 07/18/2017 0830   PROT 6.8 11/07/2014 0902   ALBUMIN 3.9 07/18/2017 0830   ALBUMIN 3.5 11/07/2014 0902   AST 39 07/18/2017 0830   AST 41 11/07/2014 0902   ALT 45 07/18/2017 0830   ALT 31 11/07/2014 0902   ALKPHOS 96 07/18/2017 0830   ALKPHOS 137 (H) 11/07/2014 0902   BILITOT 0.5 07/18/2017 0830   BILITOT 0.8 11/07/2014 0902   GFRNONAA >60 07/18/2017 0830   GFRNONAA >60 11/07/2014 0902   GFRAA >60 07/18/2017 0830   GFRAA >60 11/07/2014 0902    No results found for: SPEP, UPEP  Lab Results  Component Value Date   WBC 4.7 07/18/2017   NEUTROABS 3.1 07/18/2017   HGB 13.0 07/18/2017   HCT 39.0 07/18/2017   MCV 83.6 07/18/2017   PLT 184 07/18/2017      Chemistry      Component Value Date/Time   NA 137 07/18/2017 0830   NA 144 08/29/2014 0908   K 3.9 07/18/2017 0830   K 3.3 (L) 08/29/2014 0908   CL 104 07/18/2017 0830   CL 107 08/29/2014 0908   CO2 25 07/18/2017 0830   CO2 29 08/29/2014 0908   BUN 14 07/18/2017 0830   BUN 18 08/29/2014 0908   CREATININE 0.69 07/18/2017 0830   CREATININE 0.52 11/07/2014 0902      Component Value Date/Time   CALCIUM 8.6 (L) 07/18/2017 0830   CALCIUM 8.4 (L) 08/29/2014 0908   ALKPHOS 96 07/18/2017 0830   ALKPHOS 137 (H) 11/07/2014  0902   AST 39 07/18/2017 0830   AST 41 11/07/2014 0902   ALT 45 07/18/2017 0830   ALT 31 11/07/2014 0902   BILITOT 0.5 07/18/2017 0830   BILITOT  0.8 11/07/2014 0902     IMPRESSION: Mild increase in size of 3 cm heterogeneous hypovascular liver lesion compared to prior study, which could represent a liver metastasis. Abdomen MRI without and with contrast recommended for further characterization.  No other sites of metastatic disease identified.  Stable complex left hepatic lobe cyst and mild steatosis.  Stable left hepatic lobe cyst.   Electronically Signed   By: Earle Gell M.D.   On: 07/18/2017 09:31   RADIOGRAPHIC STUDIES: I have personally reviewed the radiological images as listed and agreed with the findings in the report. No results found.   ASSESSMENT & PLAN:  Rectal cancer ([place]) # Rectal cancer, clinical TXN2M0, at least stage III [2015]. Clinically NED; CT scan-NED except for liver lesion [see discussion below]  #Complex liver lesion-PET negative in 2015.  Adjacent approximately 2.5 cm lesion question metastases versus benign.  Reviewed the tumor conference; felt that the adjacent liver lesion has been stable/in fact smaller compared to scan in 2017.  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 continue neurontin [300 mg 3 times a day; one extra pill at nighttime.  Recommend acupuncture.  Patient is interested.  # port explantation- port flush q 22m; patient seen on explantation if the next MRI  scan is negative  # Obesity/weight gain- discussed the positive effects of weight loss exercise and healthy eating- in cutting of the risk of colon cancer recurrence.  # "Head cold"-URI.  Recommend symptomatic treatment with Claritin and Mucinex.  # port removal- hold off unitl next scan.   # follow up in 6 months-labs;CEA; MRI liver prior. Port flush evey 27months.    Orders Placed This Encounter  Procedures  . MR LIVER W WO CONTRAST    Standing Status:   Future    Standing Expiration Date:   09/26/2018    Order Specific Question:   If indicated for the ordered procedure, I authorize the administration of contrast media per  Radiology protocol    Answer:   Yes    Order Specific Question:   What is the patient's sedation requirement?    Answer:   No Sedation    Order Specific Question:   Does the patient have a pacemaker or implanted devices?    Answer:   No    Order Specific Question:   Radiology Contrast Protocol - do NOT remove file path    Answer:   [email or web address]    Order Specific Question:   Preferred imaging location?    Answer:   [facility] (table limit-300lbs)  . CBC with Differential    Standing Status:   Future    Standing Expiration Date:   07/28/2018  . Comprehensive metabolic panel    Standing Status:   Future    Standing Expiration Date:   07/28/2018  . CEA    Standing Status:   Future    Standing Expiration Date:   07/28/2018   All questions were answered. The patient knows to call the clinic with any problems, questions or concerns.      Cammie Sickle, MD 07/29/2017 8:40 AM

## 2017-07-28 NOTE — Assessment & Plan Note (Addendum)
#   Rectal cancer, clinical TXN2M0, at least stage III [2015]. Clinically NED; CT scan-NED except for liver lesion [see discussion below]  #Complex liver lesion-PET negative in 2015.  Adjacent approximately 2.5 cm lesion question metastases versus benign.  Reviewed the tumor conference jan 2019; felt that the adjacent liver lesion has been stable/in fact smaller compared to scan in 2017.  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 continue neurontin [300 mg 3 times a day; one extra pill at nighttime.  Recommend acupuncture.  Patient is interested.  # port explantation- port flush q 58m; patient seen on explantation if the next MRI  scan is negative  # Obesity/weight gain- discussed the positive effects of weight loss exercise and healthy eating- in cutting of the risk of colon cancer recurrence.  # "Head cold"-URI.  Recommend symptomatic treatment with Claritin and Mucinex.  # port removal- hold off unitl next scan.   # follow up in 6 months-labs;CEA; MRI liver prior. Port flush evey 49months.   # I reviewed the blood work- with the patient in detail; also reviewed the imaging independently [as summarized above]; and with the patient in detail.

## 2017-08-07 ENCOUNTER — Telehealth: Payer: Self-pay | Admitting: Family

## 2017-08-07 DIAGNOSIS — J329 Chronic sinusitis, unspecified: Secondary | ICD-10-CM

## 2017-08-07 DIAGNOSIS — B9689 Other specified bacterial agents as the cause of diseases classified elsewhere: Secondary | ICD-10-CM

## 2017-08-07 MED ORDER — FLUTICASONE PROPIONATE 50 MCG/ACT NA SUSP: 2.0000 | Freq: Every day | NASAL | 1 refills | Status: DC

## 2017-08-07 MED ORDER — AMOXICILLIN-POT CLAVULANATE 875-125 MG PO TABS: 1.0000 | ORAL_TABLET | Freq: Two times a day (BID) | ORAL | 0 refills | Status: AC

## 2017-08-07 NOTE — Progress Notes (Signed)
Thank you for the details you included in the comment boxes. Those details are very helpful in determining the best course of treatment for you and help [place] to provide the best care. They found out a couple years ago that clear vs color makes no difference if it is a virus or bacteria. Given the length of time you've had this, it is very likely to be bacteria and we can treat with antibiotics.  We are sorry that you are not feeling well.  Here is how we plan to help!  Based on what you have shared with me it looks like you have sinusitis.  Sinusitis is inflammation and infection in the sinus cavities of the head.  Based on your presentation I believe you most likely have Acute Bacterial Sinusitis.  This is an infection caused by bacteria and is treated with antibiotics. I have prescribed Augmentin 875mg /125mg  one tablet twice daily with food, for 7 days. You may use an oral decongestant such as Mucinex D or if you have glaucoma or high blood pressure use plain Mucinex. Saline nasal spray help and can safely be used as often as needed for congestion.  If you develop worsening sinus pain, fever or notice severe headache and vision changes, or if symptoms are not better after completion of antibiotic, please schedule an appointment with a health care provider.  I have also sent Flonase spray, 2 sprays in each nostril once daily.   Sinus infections are not as easily transmitted as other respiratory infection, however we still recommend that you avoid close contact with loved ones, especially the very young and elderly.  Remember to wash your hands thoroughly throughout the day as this is the number one way to prevent the spread of infection!  Home Care:  Only take medications as instructed by your medical team.  Complete the entire course of an antibiotic.  Do not take these medications with alcohol.  A steam or ultrasonic humidifier can help congestion.  You can place a towel over your head and breathe  in the steam from hot water coming from a faucet.  Avoid close contacts especially the very young and the elderly.  Cover your mouth when you cough or sneeze.  Always remember to wash your hands.  Get Help Right Away If:  You develop worsening fever or sinus pain.  You develop a severe head ache or visual changes.  Your symptoms persist after you have completed your treatment plan.  Make sure you  Understand these instructions.  Will watch your condition.  Will get help right away if you are not doing well or get worse.  Your e-visit answers were reviewed by a board certified advanced clinical practitioner to complete your personal care plan.  Depending on the condition, your plan could have included both over the counter or prescription medications.  If there is a problem please reply  once you have received a response from your provider.  Your safety is important to [place].  If you have drug allergies check your prescription carefully.    You can use MyChart to ask questions about today's visit, request a non-urgent call back, or ask for a work or school excuse for 24 hours related to this e-Visit. If it has been greater than 24 hours you will need to follow up with your provider, or enter a new e-Visit to address those concerns.  You will get an e-mail in the next two days asking about your experience.  I hope that  your e-visit has been valuable and will speed your recovery. Thank you for using e-visits.

## 2017-08-23 ENCOUNTER — Other Ambulatory Visit: Payer: Self-pay | Admitting: Internal Medicine

## 2017-08-23 DIAGNOSIS — C20 Malignant neoplasm of rectum: Secondary | ICD-10-CM

## 2017-09-22 ENCOUNTER — Inpatient Hospital Stay: Payer: Self-pay | Attending: Internal Medicine

## 2017-09-22 DIAGNOSIS — Z452 Encounter for adjustment and management of vascular access device: Secondary | ICD-10-CM | POA: Insufficient documentation

## 2017-09-22 DIAGNOSIS — Z95828 Presence of other vascular implants and grafts: Secondary | ICD-10-CM

## 2017-09-22 DIAGNOSIS — C20 Malignant neoplasm of rectum: Secondary | ICD-10-CM | POA: Insufficient documentation

## 2017-09-22 MED ORDER — SODIUM CHLORIDE 0.9% FLUSH
10.0000 mL | INTRAVENOUS | Status: DC | PRN
  Administered 2017-09-22: 10 mL via INTRAVENOUS
  Filled 2017-09-22: qty 10

## 2017-09-22 MED ORDER — HEPARIN SOD (PORK) LOCK FLUSH 100 UNIT/ML IV SOLN
500.0000 [IU] | Freq: Once | INTRAVENOUS | Status: AC
  Administered 2017-09-22: 500 [IU] via INTRAVENOUS

## 2017-09-24 ENCOUNTER — Other Ambulatory Visit: Payer: Self-pay | Admitting: Family

## 2017-09-24 DIAGNOSIS — F329 Major depressive disorder, single episode, unspecified: Secondary | ICD-10-CM

## 2017-09-24 DIAGNOSIS — F419 Anxiety disorder, unspecified: Principal | ICD-10-CM

## 2017-09-26 ENCOUNTER — Encounter: Payer: Self-pay | Admitting: Internal Medicine

## 2017-09-29 ENCOUNTER — Telehealth: Payer: Self-pay | Admitting: Family

## 2017-09-29 DIAGNOSIS — B9689 Other specified bacterial agents as the cause of diseases classified elsewhere: Secondary | ICD-10-CM

## 2017-09-29 DIAGNOSIS — J028 Acute pharyngitis due to other specified organisms: Secondary | ICD-10-CM

## 2017-09-29 MED ORDER — PREDNISONE 5 MG PO TABS
5.0000 mg | ORAL_TABLET | ORAL | 0 refills | Status: DC
Start: 2017-09-29 — End: 2018-01-05

## 2017-09-29 MED ORDER — BENZONATATE 100 MG PO CAPS: 100.0000 mg | ORAL_CAPSULE | Freq: Three times a day (TID) | ORAL | 0 refills | Status: DC | PRN

## 2017-09-29 MED ORDER — AZITHROMYCIN 250 MG PO TABS: ORAL_TABLET | ORAL | 0 refills | Status: DC

## 2017-09-29 NOTE — Progress Notes (Signed)
Thank you for the details you included in the comment boxes. Those details are very helpful in determining the best course of treatment for you and help [place] to provide the best care. This appears like you had a sinus infection that has moved into your airways and/or lungs and has now become pharyngitis. See plan below. Hope you feel better soon!  We are sorry that you are not feeling well.  Here is how we plan to help!  Based on your presentation I believe you most likely have A cough due to bacteria.  When patients have a fever and a productive cough with a change in color or increased sputum production, we are concerned about bacterial bronchitis.  If left untreated it can progress to pneumonia.  If your symptoms do not improve with your treatment plan it is important that you contact your provider.   I have prescribed Azithromyin 250 mg: two tablets now and then one tablet daily for 4 additonal days    In addition you may use A non-prescription cough medication called Mucinex DM: take 2 tablets every 12 hours. and A prescription cough medication called Tessalon Perles 100mg . You may take 1-2 capsules every 8 hours as needed for your cough.  Sterapred 5 mg dosepak  From your responses in the eVisit questionnaire you describe inflammation in the upper respiratory tract which is causing a significant cough.  This is commonly called Bronchitis and has four common causes:    Allergies  Viral Infections  Acid Reflux  Bacterial Infection Allergies, viruses and acid reflux are treated by controlling symptoms or eliminating the cause. An example might be a cough caused by taking certain blood pressure medications. You stop the cough by changing the medication. Another example might be a cough caused by acid reflux. Controlling the reflux helps control the cough.  USE OF BRONCHODILATOR ("RESCUE") INHALERS: There is a risk from using your bronchodilator too frequently.  The risk is that over-reliance on  a medication which only relaxes the muscles surrounding the breathing tubes can reduce the effectiveness of medications prescribed to reduce swelling and congestion of the tubes themselves.  Although you feel brief relief from the bronchodilator inhaler, your asthma may actually be worsening with the tubes becoming more swollen and filled with mucus.  This can delay other crucial treatments, such as oral steroid medications. If you need to use a bronchodilator inhaler daily, several times per day, you should discuss this with your provider.  There are probably better treatments that could be used to keep your asthma under control.     HOME CARE . Only take medications as instructed by your medical team. . Complete the entire course of an antibiotic. . Drink plenty of fluids and get plenty of rest. . Avoid close contacts especially the very young and the elderly . Cover your mouth if you cough or cough into your sleeve. . Always remember to wash your hands . A steam or ultrasonic humidifier can help congestion.   GET HELP RIGHT AWAY IF: . You develop worsening fever. . You become short of breath . You cough up blood. . Your symptoms persist after you have completed your treatment plan MAKE SURE YOU   Understand these instructions.  Will watch your condition.  Will get help right away if you are not doing well or get worse.  Your e-visit answers were reviewed by a board certified advanced clinical practitioner to complete your personal care plan.  Depending on the condition, your  plan could have included both over the counter or prescription medications. If there is a problem please reply  once you have received a response from your provider. Your safety is important to [place].  If you have drug allergies check your prescription carefully.    You can use MyChart to ask questions about today's visit, request a non-urgent call back, or ask for a work or school excuse for 24 hours related to this  e-Visit. If it has been greater than 24 hours you will need to follow up with your provider, or enter a new e-Visit to address those concerns. You will get an e-mail in the next two days asking about your experience.  I hope that your e-visit has been valuable and will speed your recovery. Thank you for using e-visits.

## 2017-10-03 ENCOUNTER — Other Ambulatory Visit: Payer: Self-pay | Admitting: Internal Medicine

## 2017-10-03 DIAGNOSIS — F419 Anxiety disorder, unspecified: Principal | ICD-10-CM

## 2017-10-03 DIAGNOSIS — F32A Depression, unspecified: Secondary | ICD-10-CM

## 2017-10-03 DIAGNOSIS — F329 Major depressive disorder, single episode, unspecified: Secondary | ICD-10-CM

## 2017-10-06 NOTE — Telephone Encounter (Signed)
Refilled: 12/14/20118 Last OV: 01/27/2017 Next OV: not scheduled

## 2017-10-06 NOTE — Telephone Encounter (Signed)
Please call pt  She needs f/u with me since on a controlled substance  I have refilled  I looked up patient on [place] Controlled Substances Reporting System and saw no activity that raised concern of inappropriate use.

## 2017-10-08 NOTE — Telephone Encounter (Signed)
Mychart message sent.

## 2017-10-15 ENCOUNTER — Encounter: Payer: Self-pay | Admitting: Family

## 2017-11-24 ENCOUNTER — Inpatient Hospital Stay: Payer: Self-pay | Attending: Internal Medicine

## 2017-11-24 DIAGNOSIS — C20 Malignant neoplasm of rectum: Secondary | ICD-10-CM | POA: Insufficient documentation

## 2017-11-24 DIAGNOSIS — Z95828 Presence of other vascular implants and grafts: Secondary | ICD-10-CM

## 2017-11-24 MED ORDER — HEPARIN SOD (PORK) LOCK FLUSH 100 UNIT/ML IV SOLN
500.0000 [IU] | Freq: Once | INTRAVENOUS | Status: AC
  Administered 2017-11-24: 500 [IU] via INTRAVENOUS

## 2017-11-24 MED ORDER — SODIUM CHLORIDE 0.9% FLUSH
10.0000 mL | INTRAVENOUS | Status: AC | PRN
  Administered 2017-11-24: 10 mL via INTRAVENOUS
  Filled 2017-11-24: qty 10

## 2017-12-06 ENCOUNTER — Other Ambulatory Visit: Payer: Self-pay | Admitting: Family

## 2017-12-06 DIAGNOSIS — F329 Major depressive disorder, single episode, unspecified: Secondary | ICD-10-CM

## 2017-12-06 DIAGNOSIS — F419 Anxiety disorder, unspecified: Principal | ICD-10-CM

## 2017-12-08 NOTE — Telephone Encounter (Signed)
Last filled 11/06/17 Last office visit 01/27/17 No office visit scheduled

## 2017-12-10 ENCOUNTER — Encounter: Payer: Self-pay | Admitting: Family

## 2017-12-10 DIAGNOSIS — Z1231 Encounter for screening mammogram for malignant neoplasm of breast: Secondary | ICD-10-CM

## 2017-12-10 NOTE — Telephone Encounter (Signed)
   Call pt  LAST refill. Last OV 01/2017.   She needs OV prior to any more refills of xanax. Please remind her this is a controlled substance and she needs appts every 4 months for me to keep on this medication   I looked up patient on [place] Controlled Substances Reporting System and saw no activity that raised concern of inappropriate use.

## 2017-12-11 NOTE — Telephone Encounter (Signed)
Patient advised of below and verbalized understanding , appointment scheduled 01/05/18

## 2018-01-05 ENCOUNTER — Encounter: Payer: Self-pay | Admitting: Family

## 2018-01-05 ENCOUNTER — Ambulatory Visit (INDEPENDENT_AMBULATORY_CARE_PROVIDER_SITE_OTHER): Payer: Self-pay | Admitting: Family

## 2018-01-05 VITALS — BP 128/82 | HR 64 | Temp 98.5°F | Wt 253.0 lb

## 2018-01-05 DIAGNOSIS — F419 Anxiety disorder, unspecified: Secondary | ICD-10-CM

## 2018-01-05 DIAGNOSIS — G4700 Insomnia, unspecified: Secondary | ICD-10-CM

## 2018-01-05 DIAGNOSIS — I10 Essential (primary) hypertension: Secondary | ICD-10-CM

## 2018-01-05 DIAGNOSIS — F32A Depression, unspecified: Secondary | ICD-10-CM

## 2018-01-05 DIAGNOSIS — F329 Major depressive disorder, single episode, unspecified: Secondary | ICD-10-CM

## 2018-01-05 MED ORDER — BUPROPION HCL ER (XL) 150 MG PO TB24: ORAL_TABLET | ORAL | 3 refills | Status: DC

## 2018-01-05 MED ORDER — ALPRAZOLAM 0.25 MG PO TABS: 0.2500 mg | ORAL_TABLET | Freq: Every evening | ORAL | 1 refills | Status: DC | PRN

## 2018-01-05 MED ORDER — FLUOXETINE HCL 20 MG PO TABS: 20.0000 mg | ORAL_TABLET | Freq: Every day | ORAL | 3 refills | Status: DC

## 2018-01-05 MED ORDER — AMLODIPINE BESYLATE 10 MG PO TABS: 10.0000 mg | ORAL_TABLET | Freq: Every day | ORAL | 4 refills | Status: DC

## 2018-01-05 NOTE — Patient Instructions (Addendum)
Please schedule mammogram  Trial of wellbutrin.  Decrease prozac to 20mg . You may start taking the 20mg  dose today.   Let me know how you are doing.

## 2018-01-05 NOTE — Assessment & Plan Note (Signed)
At goal. continue 

## 2018-01-05 NOTE — Progress Notes (Signed)
Subjective:    Patient ID: Tammie Robinson, female    DOB: 01-22-1966, 52 y.o.   MRN: [id number]  CC: Tammie Robinson is a 52 y.o. female who presents today for follow up.   HPI: Insomnia- takes xanax for sleep. Would like refill.  No caffeine. Watches tv in bed. Notes eating a lot at night. No exercise. Plans to start walking.   Weight gain- Notes a lot of stress. Eating at night.   Depression- cannot tell if prozac helpful. Trouble getting motivated. No Si/hi. No alcohol use or h/o seizures  Due for mammogram .  Dr B- h/o rectal cancer 07/2017 HISTORY:  Past Medical History:  Diagnosis Date  . Anemia    prior to CA dx  . Asthma    seasonal,never hospitalized  . Blood in stool   . Colostomy in place [facility])   . History of ERCP   . Hypertension   . Migraine   . 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 PTSD (post-traumatic stress disorder)   . Rectal cancer ([place])   . Wears contact lenses    Past Surgical History:  Procedure Laterality Date  . ABDOMINAL HYSTERECTOMY  04/12/2014   [facility] with CA surgery; no cervix ( M.Arnett 01/2017)  . CHOLECYSTECTOMY  2012   Dr. Jamal Collin  . COLONOSCOPY WITH PROPOFOL N/A 01/26/2016   Procedure: COLONOSCOPY WITH PROPOFOL through colostomy;  Surgeon: Lucilla Lame, MD;  Location: [place];  Service: Endoscopy;  Laterality: N/A;  port-a-cath  . POLYPECTOMY  01/26/2016   Procedure: POLYPECTOMY;  Surgeon: Lucilla Lame, MD;  Location: [place];  Service: Endoscopy;;  . PORTACATH PLACEMENT    . PROCTECTOMY  04/12/14   [facility] - CA surgery - with colostomy  . SALPINGOOPHORECTOMY Bilateral 04/12/14   [facility] - with CA surg  . VAGINA RECONSTRUCTION SURGERY  04/12/14   [facility] - transabdonimal rectus abdominus muscle reconstruction  . VAGINAL DELIVERY     2, preterm labor   Family History  Problem Relation Age of Onset  . Arthritis Mother   . Stroke Mother   . Hypertension Mother   . Diabetes Mother   . Arthritis Father   . Heart  disease Father 56       s/p CABG  . Stroke Father   . Hypertension Father   . Alcohol abuse Maternal Grandmother   . Diabetes Maternal Grandmother   . Alcohol abuse Maternal Grandfather   . Diabetes Maternal Grandfather   . Cancer Paternal Grandmother        breast  . Heart disease Paternal Grandfather   . Breast cancer Paternal Aunt 34  . Colon cancer Neg Hx     Allergies: Lisinopril Current Outpatient Medications on File Prior to Visit  Medication Sig Dispense Refill  . acetaminophen (TYLENOL) 325 MG tablet Take 325 mg by mouth every 6 (six) hours as needed for mild pain.     Marland Kitchen albuterol (PROVENTIL HFA;VENTOLIN HFA) 108 (90 BASE) MCG/ACT inhaler Inhale 1-2 puffs into the lungs every 4 (four) hours as needed for wheezing or shortness of breath. 1 Inhaler 3  . ferrous sulfate 325 (65 FE) MG tablet Take by mouth 2 (two) times daily with a meal.     . fluticasone (FLONASE) 50 MCG/ACT nasal spray Place 2 sprays into both nostrils daily. 16 g 1  . gabapentin (NEURONTIN) 300 MG capsule TAKE ONE CAPSULE BY MOUTH THREE TIMES DAILY. TAKE 1 EXTRA CAPSULE AT BEDTIME 120  capsule 5  . lidocaine-prilocaine (EMLA) cream   0   Current Facility-Administered Medications on File Prior to Visit  Medication Dose Route Frequency Provider Last Rate Last Dose  . heparin lock flush 100 unit/mL  500 Units Intravenous Once Leia Alf, MD      . sodium chloride 0.9 % injection 10 mL  10 mL Intravenous PRN Leia Alf, MD      . sodium chloride 0.9 % injection 10 mL  10 mL Intravenous PRN Leia Alf, MD   10 mL at 11/28/14 1120  . sodium chloride flush (NS) 0.9 % injection 10 mL  10 mL Intravenous PRN Evlyn Kanner, NP   10 mL at 01/03/16 1413  . sodium chloride flush (NS) 0.9 % injection 10 mL  10 mL Intravenous PRN Lloyd Huger, MD   10 mL at 11/15/16 1111  . sodium chloride flush (NS) 0.9 % injection 10 mL  10 mL Intravenous PRN Cammie Sickle, MD   10 mL at 11/24/17 1434     Social History   Tobacco Use  . Smoking status: Never Smoker  . Smokeless tobacco: Never Used  Substance Use Topics  . Alcohol use: No    Comment: rare - holidays  . Drug use: No    Review of Systems  Constitutional: Negative for chills and fever.  Respiratory: Negative for cough.   Cardiovascular: Negative for chest pain and palpitations.  Gastrointestinal: Negative for nausea and vomiting.  Psychiatric/Behavioral: Positive for sleep disturbance. Negative for suicidal ideas.      Objective:    BP 128/82 (BP Location: Left Arm, Patient Position: Sitting, Cuff Size: Large)   Pulse 64   Temp 98.5 F (36.9 C) (Oral)   Wt 253 lb (114.8 kg)   LMP 12/11/2013   SpO2 98%   BMI 42.10 kg/m  BP Readings from Last 3 Encounters:  01/05/18 128/82  07/28/17 (!) 156/84  01/27/17 120/80   Wt Readings from Last 3 Encounters:  01/05/18 253 lb (114.8 kg)  07/28/17 244 lb (110.7 kg)  01/27/17 243 lb 12.8 oz (110.6 kg)    Physical Exam  Constitutional: She appears well-developed and well-nourished.  Eyes: Conjunctivae are normal.  Cardiovascular: Normal rate, regular rhythm, normal heart sounds and normal pulses.  Pulmonary/Chest: Effort normal and breath sounds normal. She has no wheezes. She has no rhonchi. She has no rales.  Neurological: She is alert.  Skin: Skin is warm and dry.  Psychiatric: She has a normal mood and affect. Her speech is normal and behavior is normal. Thought content normal.  Vitals reviewed.      Assessment & Plan:   Problem List Items Addressed This Visit      Cardiovascular and Mediastinum   Hypertension    At goal. continue      Relevant Medications   amLODipine (NORVASC) 10 MG tablet     Other   Anxiety and depression - Primary    Unchanged. In context of weight gain, we agreed to trial wellbutrin, taper down on prozac appropriate. Close follow up. Patient will let me know how she is doing      Relevant Medications   FLUoxetine  (PROZAC) 20 MG tablet   buPROPion (WELLBUTRIN XL) 150 MG 24 hr tablet   ALPRAZolam (XANAX) 0.25 MG tablet   Insomnia    Uses xanax qhs. Advised exercise and try to limit nightly use of medication. Refilled today and will continue to discuss decreasing with patient. ? Trazodone trial I looked  up patient on [place] Controlled Substances Reporting System and saw no activity that raised concern of inappropriate use.            I have discontinued Jamiracle Avants. Mclaurin's FLUoxetine, benzonatate, azithromycin, and predniSONE. I have also changed her ALPRAZolam and amLODipine. Additionally, I am having her start on FLUoxetine and buPROPion. Lastly, I am having her maintain her acetaminophen, ferrous sulfate, lidocaine-prilocaine, albuterol, fluticasone, and gabapentin.   Meds ordered this encounter  Medications  . FLUoxetine (PROZAC) 20 MG tablet    Sig: Take 1 tablet (20 mg total) by mouth daily.    Dispense:  30 tablet    Refill:  3    Order Specific Question:   Supervising Provider    Answer:   Deborra Medina L [[id number]]  . buPROPion (WELLBUTRIN XL) 150 MG 24 hr tablet    Sig: Start 150 mg ER PO qam, increase after 7 days to 300 mg PO qam    Dispense:  60 tablet    Refill:  3    Order Specific Question:   Supervising Provider    Answer:   Deborra Medina L [[id number]]  . ALPRAZolam (XANAX) 0.25 MG tablet    Sig: Take 1 tablet (0.25 mg total) by mouth at bedtime as needed for anxiety.    Dispense:  30 tablet    Refill:  1    Do not fill until on or after  01/10/18    Order Specific Question:   Supervising Provider    Answer:   Deborra Medina L [[id number]]  . amLODipine (NORVASC) 10 MG tablet    Sig: Take 1 tablet (10 mg total) by mouth daily.    Dispense:  90 tablet    Refill:  4    Order Specific Question:   Supervising Provider    Answer:   Crecencio Mc [[id number]]    Return precautions given.   Risks, benefits, and alternatives of the medications and treatment plan prescribed today were discussed, and  patient expressed understanding.   Education regarding symptom management and diagnosis given to patient on AVS.  Continue to follow with Burnard Hawthorne, FNP for routine health maintenance.   Lenard Simmer and I agreed with plan.   [name], FNP

## 2018-01-05 NOTE — Assessment & Plan Note (Signed)
Uses xanax qhs. Advised exercise and try to limit nightly use of medication. Refilled today and will continue to discuss decreasing with patient. ? Trazodone trial I looked up patient on [place] Controlled Substances Reporting System and saw no activity that raised concern of inappropriate use.

## 2018-01-05 NOTE — Assessment & Plan Note (Signed)
Unchanged. In context of weight gain, we agreed to trial wellbutrin, taper down on prozac appropriate. Close follow up. Patient will let me know how she is doing

## 2018-01-10 ENCOUNTER — Encounter: Payer: Self-pay | Admitting: Family

## 2018-01-10 DIAGNOSIS — F329 Major depressive disorder, single episode, unspecified: Secondary | ICD-10-CM

## 2018-01-10 DIAGNOSIS — F32A Depression, unspecified: Secondary | ICD-10-CM

## 2018-01-10 DIAGNOSIS — F419 Anxiety disorder, unspecified: Principal | ICD-10-CM

## 2018-01-19 MED ORDER — ALPRAZOLAM 0.25 MG PO TABS: 0.2500 mg | ORAL_TABLET | Freq: Every evening | ORAL | 1 refills | Status: DC | PRN

## 2018-01-19 NOTE — Telephone Encounter (Signed)
Call pt Xanax refilled. Please confirm how she is taking medication. How often,  ect.   She needs an appt to follow up with me; please schedule   I looked up patient on [place] Controlled Substances Reporting System and saw no activity that raised concern of inappropriate use.

## 2018-01-23 ENCOUNTER — Ambulatory Visit
Admission: RE | Admit: 2018-01-23 | Discharge: 2018-01-23 | Disposition: A | Discharge status: Home / Self Care | Payer: Self-pay | Source: Ambulatory Visit | Attending: Internal Medicine | Admitting: Internal Medicine

## 2018-01-23 DIAGNOSIS — C20 Malignant neoplasm of rectum: Secondary | ICD-10-CM | POA: Insufficient documentation

## 2018-01-23 DIAGNOSIS — K7689 Other specified diseases of liver: Secondary | ICD-10-CM | POA: Insufficient documentation

## 2018-01-23 DIAGNOSIS — K760 Fatty (change of) liver, not elsewhere classified: Secondary | ICD-10-CM | POA: Insufficient documentation

## 2018-01-23 DIAGNOSIS — K769 Liver disease, unspecified: Secondary | ICD-10-CM

## 2018-01-23 LAB — POCT I-STAT CREATININE: CREATININE: 0.9 mg/dL (ref 0.44–1.00)

## 2018-01-23 MED ORDER — GADOBENATE DIMEGLUMINE 529 MG/ML IV SOLN
20.0000 mL | Freq: Once | INTRAVENOUS | Status: AC | PRN
  Administered 2018-01-23: 20 mL via INTRAVENOUS

## 2018-01-26 ENCOUNTER — Inpatient Hospital Stay: Payer: Self-pay | Attending: Internal Medicine

## 2018-01-26 ENCOUNTER — Inpatient Hospital Stay (HOSPITAL_BASED_OUTPATIENT_CLINIC_OR_DEPARTMENT_OTHER): Payer: Self-pay | Admitting: Internal Medicine

## 2018-01-26 VITALS — BP 142/88 | HR 67 | Temp 97.3°F | Resp 16 | Wt 246.2 lb

## 2018-01-26 DIAGNOSIS — F4310 Post-traumatic stress disorder, unspecified: Secondary | ICD-10-CM | POA: Insufficient documentation

## 2018-01-26 DIAGNOSIS — J45909 Unspecified asthma, uncomplicated: Secondary | ICD-10-CM | POA: Insufficient documentation

## 2018-01-26 DIAGNOSIS — Z933 Colostomy status: Secondary | ICD-10-CM | POA: Insufficient documentation

## 2018-01-26 DIAGNOSIS — C20 Malignant neoplasm of rectum: Secondary | ICD-10-CM

## 2018-01-26 DIAGNOSIS — D649 Anemia, unspecified: Secondary | ICD-10-CM | POA: Insufficient documentation

## 2018-01-26 DIAGNOSIS — K769 Liver disease, unspecified: Secondary | ICD-10-CM

## 2018-01-26 DIAGNOSIS — R948 Abnormal results of function studies of other organs and systems: Secondary | ICD-10-CM | POA: Insufficient documentation

## 2018-01-26 DIAGNOSIS — Z85048 Personal history of other malignant neoplasm of rectum, rectosigmoid junction, and anus: Secondary | ICD-10-CM | POA: Insufficient documentation

## 2018-01-26 DIAGNOSIS — Z9221 Personal history of antineoplastic chemotherapy: Secondary | ICD-10-CM | POA: Insufficient documentation

## 2018-01-26 DIAGNOSIS — Z79899 Other long term (current) drug therapy: Secondary | ICD-10-CM | POA: Insufficient documentation

## 2018-01-26 DIAGNOSIS — Z803 Family history of malignant neoplasm of breast: Secondary | ICD-10-CM | POA: Insufficient documentation

## 2018-01-26 DIAGNOSIS — Z923 Personal history of irradiation: Secondary | ICD-10-CM

## 2018-01-26 DIAGNOSIS — I10 Essential (primary) hypertension: Secondary | ICD-10-CM

## 2018-01-26 DIAGNOSIS — G629 Polyneuropathy, unspecified: Secondary | ICD-10-CM | POA: Insufficient documentation

## 2018-01-26 LAB — COMPREHENSIVE METABOLIC PANEL
ALBUMIN: 4.1 g/dL (ref 3.5–5.0)
ALT: 52 U/L — AB (ref 0–44)
AST: 47 U/L — AB (ref 15–41)
Alkaline Phosphatase: 92 U/L (ref 38–126)
Anion gap: 6 (ref 5–15)
BUN: 17 mg/dL (ref 6–20)
CHLORIDE: 106 mmol/L (ref 98–111)
CO2: 23 mmol/L (ref 22–32)
CREATININE: 0.89 mg/dL (ref 0.44–1.00)
Calcium: 8.8 mg/dL — ABNORMAL LOW (ref 8.9–10.3)
GFR calc non Af Amer: >60 mL/min (ref >60)
GLUCOSE: 108 mg/dL — AB (ref 70–99)
Potassium: 4.3 mmol/L (ref 3.5–5.1)
SODIUM: 135 mmol/L (ref 135–145)
Total Bilirubin: 1 mg/dL (ref 0.3–1.2)
Total Protein: 7.3 g/dL (ref 6.5–8.1)

## 2018-01-26 LAB — CBC WITH DIFFERENTIAL/PLATELET
Basophils Absolute: 0 10*3/uL (ref 0–0.1)
Basophils Relative: 1 %
EOS ABS: 0.2 10*3/uL (ref 0–0.7)
Eosinophils Relative: 4 %
HEMATOCRIT: 40.6 % (ref 35.0–47.0)
HEMOGLOBIN: 13.8 g/dL (ref 12.0–16.0)
LYMPHS ABS: 0.8 10*3/uL — AB (ref 1.0–3.6)
Lymphocytes Relative: 15 %
MCH: 28.6 pg (ref 26.0–34.0)
MCHC: 33.9 g/dL (ref 32.0–36.0)
MCV: 84.4 fL (ref 80.0–100.0)
MONOS PCT: 7 %
Monocytes Absolute: 0.3 10*3/uL (ref 0.2–0.9)
NEUTROS PCT: 73 %
Neutro Abs: 3.6 10*3/uL (ref 1.4–6.5)
Platelets: 175 10*3/uL (ref 150–440)
RBC: 4.8 MIL/uL (ref 3.80–5.20)
RDW: 13.9 % (ref 11.5–14.5)
WBC: 5 10*3/uL (ref 3.6–11.0)

## 2018-01-26 MED ORDER — HEPARIN SOD (PORK) LOCK FLUSH 100 UNIT/ML IV SOLN
500.0000 [IU] | Freq: Once | INTRAVENOUS | Status: AC
  Administered 2018-01-26: 500 [IU] via INTRAVENOUS

## 2018-01-26 MED ORDER — SODIUM CHLORIDE 0.9% FLUSH
10.0000 mL | Freq: Once | INTRAVENOUS | Status: AC
  Administered 2018-01-26: 10 mL via INTRAVENOUS
  Filled 2018-01-26: qty 10

## 2018-01-26 NOTE — Assessment & Plan Note (Addendum)
#   Rectal cancer, clinical TXN2M0, at least stage III [2015].  Clinically no evidence of recurrence-see discussion on liver lesions [below].  #Continue surveillance on a clinical basis/tumor marker; will hold off any further imaging.  Patient due for colonoscopy again in summer 2020  # Complex liver lesion-PET negative in 2015.  Previously reviewed the tumor conference; again MRI of the liver today shows complex cyst; adjacent stable lesion-suspect benign etiology.  No further work-up or imaging is recommended at this time.  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  Discussed regarding acupuncture.  #Mild AST ALT elevation-likely fatty liver recommend weight loss.   # port removal-would recommend port removal; refer to IR.  # # I reviewed the blood work- with the patient in detail; also reviewed the imaging independently [as summarized above]; and with the patient in detail.   #Follow-up in 1 year CBC CMP CEA.

## 2018-01-26 NOTE — Progress Notes (Signed)
[place] OFFICE PROGRESS NOTE  Patient Care Team: Burnard Hawthorne, FNP as PCP - General (Family Medicine) Rico Junker, RN as Registered Nurse Theodore Demark, RN as Registered Nurse  Cancer Staging Rectal cancer [facility]) Staging form: Colon and Rectum, AJCC 7th Edition - Clinical stage from 11/21/2014: Stage Unknown ([place], N2, M0) - Signed by Leia Alf, MD on 11/21/2014    Oncology History   # 2015- RECTAL CA STAGE III-  s/p neoadjuvant radiation and concurrent 5FU chemotherapy completed end of July/early Aug 2015.  (initially diagnosed on 12/22/13 - upper GI showed an ulcer and colonoscopy showed a large rectal mass at the most proximal extent 4 cm from the anus). Then had Abdominoperineal resection, radical hysterectomy, vaginectomy with removal of tubes/ovaries with rectus flap myocutaneous vaginal reconstruction at [facility] on 04/12/14. Surgical pathology reports residual invasive rectal adenocarcinoma low-grade, tumor invades muscularis propria, margins uninvolved, no lymphovascular or perineural invasion, 27 lymph nodes examined negative for metastasis (ypT2pN0). 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   # June 2017- colo Dr.Wohl;      Rectal cancer ([place])   01/03/2014 Initial Diagnosis    Rectal cancer ([place])         INTERVAL HISTORY:  Tammie Robinson 52 y.o.  female pleasant patient above history of stage III colon cancer is here to review the results of the MRI liver.  Patient continues to complain of chronic tingling and numbness in the feet.  Is not getting any better or worse.  Denies any blood in stools or black or stools.  No nausea no vomiting no abdominal pain no constipation.  Review of Systems  Constitutional: Negative for chills, diaphoresis, fever, malaise/fatigue and weight loss.  HENT: Negative for nosebleeds and sore throat.   Eyes: Negative for double vision.  Respiratory: Negative for cough,  hemoptysis, sputum production, shortness of breath and wheezing.   Cardiovascular: Negative for chest pain, palpitations, orthopnea and leg swelling.  Gastrointestinal: Negative for abdominal pain, blood in stool, constipation, diarrhea, heartburn, melena, nausea and vomiting.  Genitourinary: Negative for dysuria, frequency and urgency.  Musculoskeletal: Negative for back pain and joint pain.  Skin: Negative.  Negative for itching and rash.  Neurological: Positive for tingling. Negative for dizziness, focal weakness, weakness and headaches.  Endo/Heme/Allergies: Does not bruise/bleed easily.  Psychiatric/Behavioral: Negative for depression. The patient is not nervous/anxious and does not have insomnia.       PAST MEDICAL HISTORY :  Past Medical History:  Diagnosis Date  . Anemia    prior to CA dx  . Asthma    seasonal,never hospitalized  . Blood in stool   . Colostomy in place [facility])   . History of ERCP   . Hypertension   . Migraine   . 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 PTSD (post-traumatic stress disorder)   . Rectal cancer ([place])   . Wears contact lenses     PAST SURGICAL HISTORY :   Past Surgical History:  Procedure Laterality Date  . ABDOMINAL HYSTERECTOMY  04/12/2014   [facility] with CA surgery; no cervix ( M.Arnett 01/2017)  . CHOLECYSTECTOMY  2012   Dr. Jamal Collin  . COLONOSCOPY WITH PROPOFOL N/A 01/26/2016   Procedure: COLONOSCOPY WITH PROPOFOL through colostomy;  Surgeon: Lucilla Lame, MD;  Location: [place];  Service: Endoscopy;  Laterality: N/A;  port-a-cath  . POLYPECTOMY  01/26/2016   Procedure: POLYPECTOMY;  Surgeon: Lucilla Lame, MD;  Location: [place];  Service: Endoscopy;;  . PORTACATH PLACEMENT    . PROCTECTOMY  04/12/14   [facility] - CA surgery - with colostomy  . SALPINGOOPHORECTOMY Bilateral 04/12/14   [facility] - with CA surg  . VAGINA RECONSTRUCTION SURGERY  04/12/14   [facility] - transabdonimal rectus abdominus muscle reconstruction  . VAGINAL  DELIVERY     2, preterm labor    FAMILY HISTORY :   Family History  Problem Relation Age of Onset  . Arthritis Mother   . Stroke Mother   . Hypertension Mother   . Diabetes Mother   . Arthritis Father   . Heart disease Father 15       s/p CABG  . Stroke Father   . Hypertension Father   . Alcohol abuse Maternal Grandmother   . Diabetes Maternal Grandmother   . Alcohol abuse Maternal Grandfather   . Diabetes Maternal Grandfather   . Cancer Paternal Grandmother        breast  . Heart disease Paternal Grandfather   . Breast cancer Paternal Aunt 81  . Colon cancer Neg Hx     SOCIAL HISTORY:   Social History   Tobacco Use  . Smoking status: Never Smoker  . Smokeless tobacco: Never Used  Substance Use Topics  . Alcohol use: No    Comment: rare - holidays  . Drug use: No    ALLERGIES:  is allergic to lisinopril.  MEDICATIONS:  Current Outpatient Medications  Medication Sig Dispense Refill  . acetaminophen (TYLENOL) 325 MG tablet Take 325 mg by mouth every 6 (six) hours as needed for mild pain.     Marland Kitchen albuterol (PROVENTIL HFA;VENTOLIN HFA) 108 (90 BASE) MCG/ACT inhaler Inhale 1-2 puffs into the lungs every 4 (four) hours as needed for wheezing or shortness of breath. 1 Inhaler 3  . ALPRAZolam (XANAX) 0.25 MG tablet Take 1 tablet (0.25 mg total) by mouth at bedtime as needed for anxiety. 30 tablet 1  . amLODipine (NORVASC) 10 MG tablet Take 1 tablet (10 mg total) by mouth daily. 90 tablet 4  . buPROPion (WELLBUTRIN XL) 150 MG 24 hr tablet Start 150 mg ER PO qam, increase after 7 days to 300 mg PO qam 60 tablet 3  . ferrous sulfate 325 (65 FE) MG tablet Take by mouth 2 (two) times daily with a meal.     . FLUoxetine (PROZAC) 20 MG tablet Take 1 tablet (20 mg total) by mouth daily. 30 tablet 3  . fluticasone (FLONASE) 50 MCG/ACT nasal spray Place 2 sprays into both nostrils daily. 16 g 1  . gabapentin (NEURONTIN) 300 MG capsule TAKE ONE CAPSULE BY MOUTH THREE TIMES DAILY. TAKE  1 EXTRA CAPSULE AT BEDTIME 120 capsule 5  . lidocaine-prilocaine (EMLA) cream   0   No current facility-administered medications for this visit.    Facility-Administered Medications Ordered in Other Visits  Medication Dose Route Frequency Provider Last Rate Last Dose  . heparin lock flush 100 unit/mL  500 Units Intravenous Once Leia Alf, MD      . sodium chloride 0.9 % injection 10 mL  10 mL Intravenous PRN Ma Hillock, Sandeep, MD      . sodium chloride 0.9 % injection 10 mL  10 mL Intravenous PRN Leia Alf, MD   10 mL at 11/28/14 1120  . sodium chloride flush (NS) 0.9 % injection 10 mL  10 mL Intravenous PRN Evlyn Kanner, NP   10 mL at 01/03/16 1413  . sodium chloride  flush (NS) 0.9 % injection 10 mL  10 mL Intravenous PRN Lloyd Huger, MD   10 mL at 11/15/16 1111  . sodium chloride flush (NS) 0.9 % injection 10 mL  10 mL Intravenous PRN Cammie Sickle, MD   10 mL at 11/24/17 1434    PHYSICAL EXAMINATION: ECOG PERFORMANCE STATUS: 1 - Symptomatic but completely ambulatory  BP (!) 142/88 (BP Location: Left Arm, Patient Position: Sitting)   Pulse 67   Temp (!) 97.3 F (36.3 C) (Tympanic)   Resp 16   Wt 246 lb 3.2 oz (111.7 kg)   LMP 12/11/2013   BMI 40.97 kg/m   Filed Weights   01/26/18 1336  Weight: 246 lb 3.2 oz (111.7 kg)    GENERAL: Well-nourished well-developed; Alert, no distress and comfortable.  accompanied by family.  EYES: no pallor or icterus OROPHARYNX: no thrush or ulceration; NECK: supple; no lymph nodes felt. LYMPH:  no palpable lymphadenopathy in the axillary or inguinal regions LUNGS: Decreased breath sounds auscultation bilaterally. No wheeze or crackles HEART/CVS: regular rate & rhythm and no murmurs; No lower extremity edema ABDOMEN:abdomen soft, non-tender and normal bowel sounds. No hepatomegaly or splenomegaly.  Musculoskeletal:no cyanosis of digits and no clubbing  PSYCH: alert & oriented x 3 with fluent speech NEURO: no  focal motor/sensory deficits SKIN:  no rashes or significant lesions    LABORATORY DATA:  I have reviewed the data as listed    Component Value Date/Time   NA 135 01/26/2018 1309   NA 144 08/29/2014 0908   K 4.3 01/26/2018 1309   K 3.3 (L) 08/29/2014 0908   CL 106 01/26/2018 1309   CL 107 08/29/2014 0908   CO2 23 01/26/2018 1309   CO2 29 08/29/2014 0908   GLUCOSE 108 (H) 01/26/2018 1309   GLUCOSE 88 08/29/2014 0908   BUN 17 01/26/2018 1309   BUN 18 08/29/2014 0908   CREATININE 0.89 01/26/2018 1309   CREATININE 0.52 11/07/2014 0902   CALCIUM 8.8 (L) 01/26/2018 1309   CALCIUM 8.4 (L) 08/29/2014 0908   PROT 7.3 01/26/2018 1309   PROT 6.8 11/07/2014 0902   ALBUMIN 4.1 01/26/2018 1309   ALBUMIN 3.5 11/07/2014 0902   AST 47 (H) 01/26/2018 1309   AST 41 11/07/2014 0902   ALT 52 (H) 01/26/2018 1309   ALT 31 11/07/2014 0902   ALKPHOS 92 01/26/2018 1309   ALKPHOS 137 (H) 11/07/2014 0902   BILITOT 1.0 01/26/2018 1309   BILITOT 0.8 11/07/2014 0902   GFRNONAA >60 01/26/2018 1309   GFRNONAA >60 11/07/2014 0902   GFRAA >60 01/26/2018 1309   GFRAA >60 11/07/2014 0902    No results found for: SPEP, UPEP  Lab Results  Component Value Date   WBC 5.0 01/26/2018   NEUTROABS 3.6 01/26/2018   HGB 13.8 01/26/2018   HCT 40.6 01/26/2018   MCV 84.4 01/26/2018   PLT 175 01/26/2018      Chemistry      Component Value Date/Time   NA 135 01/26/2018 1309   NA 144 08/29/2014 0908   K 4.3 01/26/2018 1309   K 3.3 (L) 08/29/2014 0908   CL 106 01/26/2018 1309   CL 107 08/29/2014 0908   CO2 23 01/26/2018 1309   CO2 29 08/29/2014 0908   BUN 17 01/26/2018 1309   BUN 18 08/29/2014 0908   CREATININE 0.89 01/26/2018 1309   CREATININE 0.52 11/07/2014 0902      Component Value Date/Time   CALCIUM 8.8 (L) 01/26/2018 1309  CALCIUM 8.4 (L) 08/29/2014 0908   ALKPHOS 92 01/26/2018 1309   ALKPHOS 137 (H) 11/07/2014 0902   AST 47 (H) 01/26/2018 1309   AST 41 11/07/2014 0902   ALT 52 (H)  01/26/2018 1309   ALT 31 11/07/2014 0902   BILITOT 1.0 01/26/2018 1309   BILITOT 0.8 11/07/2014 0902       RADIOGRAPHIC STUDIES: I have personally reviewed the radiological images as listed and agreed with the findings in the report. No results found.   ASSESSMENT & PLAN:  Rectal cancer ([place]) # Rectal cancer, clinical TXN2M0, at least stage III [2015].  Clinically no evidence of recurrence-see discussion on liver lesions [below].  #Continue surveillance on a clinical basis/tumor marker; will hold off any further imaging.  Patient due for colonoscopy again in summer 2020  # Complex liver lesion-PET negative in 2015.  Previously reviewed the tumor conference; again MRI of the liver today shows complex cyst; adjacent stable lesion-suspect benign etiology.  No further work-up or imaging is recommended at this time.  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  Discussed regarding acupuncture.  #Mild AST ALT elevation-likely fatty liver recommend weight loss.   # port removal-would recommend port removal; refer to IR.  # # I reviewed the blood work- with the patient in detail; also reviewed the imaging independently [as summarized above]; and with the patient in detail.   #Follow-up in 1 year CBC CMP CEA.    Orders Placed This Encounter  Procedures  . IR Removal Tun Access W/ Port W/O FL    Standing Status:   Future    Standing Expiration Date:   03/30/2019    Order Specific Question:   Reason for Exam (SYMPTOM  OR DIAGNOSIS REQUIRED)    Answer:   Port removal    Order Specific Question:   Is the patient pregnant?    Answer:   No    Order Specific Question:   Preferred Imaging Location?    Answer:   [facility]  . CBC with Differential/Platelet    Standing Status:   Future    Standing Expiration Date:   01/27/2019  . Comprehensive metabolic panel    Standing Status:   Future    Standing Expiration Date:   01/27/2019  . CEA     Standing Status:   Future    Standing Expiration Date:   01/27/2019   All questions were answered. The patient knows to call the clinic with any problems, questions or concerns.      Cammie Sickle, MD 01/26/2018 7:51 PM

## 2018-01-27 ENCOUNTER — Encounter: Payer: Self-pay | Admitting: Family

## 2018-01-27 LAB — CEA: CEA1: 2.5 ng/mL (ref 0.0–4.7)

## 2018-01-28 ENCOUNTER — Encounter
Admission: RE | Disposition: A | Discharge status: Home / Self Care | Payer: Self-pay | Source: Ambulatory Visit | Attending: Vascular Surgery

## 2018-01-28 ENCOUNTER — Ambulatory Visit: Admission: RE | Admit: 2018-01-28 | Payer: Self-pay | Source: Ambulatory Visit

## 2018-01-28 ENCOUNTER — Encounter: Payer: Self-pay | Admitting: *Deleted

## 2018-01-28 ENCOUNTER — Other Ambulatory Visit (INDEPENDENT_AMBULATORY_CARE_PROVIDER_SITE_OTHER): Payer: Self-pay | Admitting: Vascular Surgery

## 2018-01-28 ENCOUNTER — Ambulatory Visit
Admission: RE | Admit: 2018-01-28 | Discharge: 2018-01-28 | Disposition: A | Discharge status: Home / Self Care | Payer: Self-pay | Source: Ambulatory Visit | Attending: Vascular Surgery | Admitting: Vascular Surgery

## 2018-01-28 DIAGNOSIS — Z452 Encounter for adjustment and management of vascular access device: Secondary | ICD-10-CM | POA: Insufficient documentation

## 2018-01-28 DIAGNOSIS — C20 Malignant neoplasm of rectum: Secondary | ICD-10-CM | POA: Insufficient documentation

## 2018-01-28 HISTORY — PX: PORTA CATH REMOVAL: CATH118286

## 2018-01-28 SURGERY — PORTA CATH REMOVAL
Anesthesia: Moderate Sedation

## 2018-01-28 MED ORDER — HYDROMORPHONE HCL 1 MG/ML IJ SOLN: 1.0000 mg | Freq: Once | INTRAMUSCULAR | Status: DC | PRN

## 2018-01-28 MED ORDER — CEFAZOLIN SODIUM-DEXTROSE 2-4 GM/100ML-% IV SOLN
INTRAVENOUS | Status: AC
  Filled 2018-01-28: qty 100

## 2018-01-28 MED ORDER — ONDANSETRON HCL 4 MG/2ML IJ SOLN
4.0000 mg | Freq: Four times a day (QID) | INTRAMUSCULAR | Status: DC | PRN
Start: 2018-01-28 — End: 2018-01-28

## 2018-01-28 MED ORDER — SODIUM CHLORIDE 0.9 % IV SOLN
INTRAVENOUS | Status: DC
  Administered 2018-01-28: 13:00:00 via INTRAVENOUS

## 2018-01-28 MED ORDER — MIDAZOLAM HCL 2 MG/2ML IJ SOLN
INTRAMUSCULAR | Status: AC
  Filled 2018-01-28: qty 2

## 2018-01-28 MED ORDER — CEFAZOLIN SODIUM-DEXTROSE 2-4 GM/100ML-% IV SOLN: 2.0000 g | Freq: Once | INTRAVENOUS | Status: DC

## 2018-01-28 MED ORDER — FENTANYL CITRATE (PF) 100 MCG/2ML IJ SOLN
INTRAMUSCULAR | Status: DC | PRN
  Administered 2018-01-28: 50 ug via INTRAVENOUS

## 2018-01-28 MED ORDER — MIDAZOLAM HCL 2 MG/2ML IJ SOLN
INTRAMUSCULAR | Status: DC | PRN
  Administered 2018-01-28: 1 mg via INTRAVENOUS

## 2018-01-28 MED ORDER — FENTANYL CITRATE (PF) 100 MCG/2ML IJ SOLN
INTRAMUSCULAR | Status: AC
  Filled 2018-01-28: qty 2

## 2018-01-28 MED ORDER — LIDOCAINE-EPINEPHRINE (PF) 1 %-1:200000 IJ SOLN
INTRAMUSCULAR | Status: AC
  Filled 2018-01-28: qty 30

## 2018-01-28 SURGICAL SUPPLY — 3 items
PACK ANGIOGRAPHY (CUSTOM PROCEDURE TRAY) ×2 IMPLANT
PENCIL ELECTRO HAND CTR (MISCELLANEOUS) ×2 IMPLANT
SUT MNCRL AB 4-0 PS2 18 (SUTURE) ×2 IMPLANT

## 2018-01-28 NOTE — Op Note (Signed)
[facility]       Operative Note  Date: 01/28/2018  Preoperative diagnosis:  1. Anorectal cancer, completed therapy and no longer using port  Postoperative diagnosis:  Same as above  Procedures: #1. Removal of right jugular port a cath   Surgeon: Leotis Pain, MD  Anesthesia: Local with moderate conscious sedation for 15 minutes using 1 mg of Versed and 50 mcg of Fentanyl  Fluoroscopy time: none  Contrast used: 0  Estimated blood loss: Minimal  Indication for the procedure:  The patient is a 52 y.o. female who has a history of anorectal cancer and no longer needs their Port-A-Cath. The patient desires to have this removed. Risks and benefits including need for potential replacement with recurrent disease were discussed and patient is agreeable to proceed.  Description of procedure: The patient was brought to the vascular and interventional radiology suite. Moderate conscious sedation was administered during a face to face encounter with the patient throughout the procedure with my supervision of the RN administering medicines and monitoring the patient's vital signs, pulse oximetry, telemetry and mental status throughout from the start of the procedure until the patient was taken to the recovery room.  The right neck chest and shoulder were sterilely prepped and draped, and a sterile surgical field was created. The area was then anesthetized with 1% lidocaine copiously. The previous incision was reopened and electrocautery used to dissected down to the port and the catheter. These were dissected free and the catheter was gently removed from the vein in its entirety. The port was dissected out from the fibrous connective tissue and the Prolene sutures were removed. The port was then removed in its entirety including the catheter. The wound was then closed with a 3-0 Vicryl and a 4-0 Monocryl and Dermabond was placed as a dressing.  The patient was then taken to the recovery room in stable condition having tolerated the procedure well.  Complications: none  Condition: stable   Leotis Pain, MD 01/28/2018 4:50 PM   This note was created with Dragon Medical transcription system. Any errors in dictation are purely unintentional.

## 2018-01-28 NOTE — H&P (Signed)
[facility] History & Physical Update  The patient was interviewed and re-examined.  The patient's previous History and Physical has been reviewed and is unchanged.  There is no change in the plan of care. We plan to proceed with the scheduled procedure.  Leotis Pain, MD  01/28/2018, 4:25 PM

## 2018-01-28 NOTE — Progress Notes (Signed)
Pt Pre procedure BP 168/95. In recovery BP elevated at 175/95. Pt takes Norvasc 10 mg daily at night. MD updated on Pt status, advised Pt to take BP meds upon arrival home. Ok to discharge at 1730.

## 2018-01-29 ENCOUNTER — Encounter: Payer: Self-pay | Admitting: Vascular Surgery

## 2018-01-30 ENCOUNTER — Other Ambulatory Visit: Payer: Self-pay | Admitting: Family

## 2018-01-30 DIAGNOSIS — F419 Anxiety disorder, unspecified: Principal | ICD-10-CM

## 2018-01-30 DIAGNOSIS — F329 Major depressive disorder, single episode, unspecified: Secondary | ICD-10-CM

## 2018-01-30 DIAGNOSIS — F32A Depression, unspecified: Secondary | ICD-10-CM

## 2018-01-30 MED ORDER — FLUOXETINE HCL 20 MG PO TABS: 40.0000 mg | ORAL_TABLET | Freq: Every day | ORAL | 3 refills | Status: DC

## 2018-01-30 MED ORDER — ALPRAZOLAM 0.25 MG PO TABS: 0.2500 mg | ORAL_TABLET | Freq: Two times a day (BID) | ORAL | 1 refills | Status: DC | PRN

## 2018-01-30 NOTE — Progress Notes (Signed)
I looked up patient on [place] Controlled Substances Reporting System and saw no activity that raised concern of inappropriate use.   See mychart messages

## 2018-02-04 NOTE — Telephone Encounter (Signed)
Appointment 03/09/18

## 2018-02-20 ENCOUNTER — Other Ambulatory Visit: Payer: Self-pay | Admitting: *Deleted

## 2018-02-20 DIAGNOSIS — C20 Malignant neoplasm of rectum: Secondary | ICD-10-CM

## 2018-02-20 MED ORDER — GABAPENTIN 300 MG PO CAPS: ORAL_CAPSULE | ORAL | 5 refills | Status: DC

## 2018-03-09 ENCOUNTER — Ambulatory Visit: Payer: Self-pay | Admitting: Family

## 2018-03-16 ENCOUNTER — Encounter: Payer: Self-pay | Admitting: Family

## 2018-03-16 ENCOUNTER — Ambulatory Visit (INDEPENDENT_AMBULATORY_CARE_PROVIDER_SITE_OTHER): Payer: Self-pay | Admitting: Family

## 2018-03-16 VITALS — BP 110/80 | HR 69 | Temp 98.4°F | Resp 16 | Ht 65.0 in | Wt 250.0 lb

## 2018-03-16 DIAGNOSIS — F419 Anxiety disorder, unspecified: Secondary | ICD-10-CM

## 2018-03-16 DIAGNOSIS — F329 Major depressive disorder, single episode, unspecified: Secondary | ICD-10-CM

## 2018-03-16 MED ORDER — TRAZODONE HCL 50 MG PO TABS: 25.0000 mg | ORAL_TABLET | Freq: Every day | ORAL | 1 refills | Status: DC

## 2018-03-16 MED ORDER — ALPRAZOLAM 0.25 MG PO TABS: 0.2500 mg | ORAL_TABLET | Freq: Two times a day (BID) | ORAL | 1 refills | Status: DC | PRN

## 2018-03-16 NOTE — Patient Instructions (Addendum)
We will do a trial of low-dose trazodone 25 mg at bedtime.  Please take this every night.  As we discussed, will need to stay hypervigilant for signs and symptoms of serotonin syndrome as you were on multiple agents that increase her serotonin.  Symptoms of this would be general malaise,  flulike symptoms, rapid heart rate excessive sweating.  Please try to limit use of xanax.   Essentials for good sleep:   #1 Exercise #2 Limit Caffeine ( no caffeine after lunch) #3 No smart phones, TV prior to bed -- BLUE light is VERY activating and send the brain an 'awake message.'  #4 Go to bed at same time of night each night and get up at same time of day.  #5 Take 0.5 to 5mg  melatonin at 7pm with dinner -this is when natural melatonin will start to increase #6 May try over the counter Unisom for sleep aid

## 2018-03-16 NOTE — Assessment & Plan Note (Signed)
Largely unchanged.  Patient is on Prozac 40 mg, Wellbutrin 300 mg.  Discussed sleep hygiene including her caffeine use, sleeping on a couch etc.  Advised first-line lifestyle modifications.  Patient preference was to go ahead and start trazodone.  Discussed in great length  serotonin syndrome, patient is aware of this and will remain hypervigilant.  Will start low-dose trazodone; also encouraged her to go and start melatonin.  Advised I would like for her to limit her Xanax use.  Close follow-up. Refilled xanax today; I looked up patient on  Controlled Substances Reporting System and saw no activity that raised concern of inappropriate use.

## 2018-03-16 NOTE — Progress Notes (Signed)
Subjective:    Patient ID: Tammie Robinson, female    DOB: 1966/03/05, 52 y.o.   MRN: [id number]  CC: Tammie Robinson is a 52 y.o. female who presents today for follow up.   HPI: Depression- on prozac 40mg , on wellbutrin 300mg  qam. Father passed away 2 months ago. Caring for her mother, sleeping on her couch right now. She is not more anxious or irritable on wellbutrin.   Insomnia-unchanged. Doesn't go to bed at same time of night.  taking 2 tablets xanax at bedtime for sleep. If having a bad day, will take one tablet in the morning and one at night.  Drinking sweet tea, dr pepper - 'all the time.'                                             HISTORY:  Past Medical History:  Diagnosis Date  . Anemia    prior to CA dx  . Asthma    seasonal,never hospitalized  . Blood in stool   . Colostomy in place [facility])   . History of ERCP   . Hypertension   . Migraine   . 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 PTSD (post-traumatic stress disorder)   . Rectal cancer ([place])   . Wears contact lenses    Past Surgical History:  Procedure Laterality Date  . ABDOMINAL HYSTERECTOMY  04/12/2014   [facility] with CA surgery; no cervix ( M.Arnett 01/2017)  . CHOLECYSTECTOMY  2012   Dr. Jamal Collin  . COLONOSCOPY WITH PROPOFOL N/A 01/26/2016   Procedure: COLONOSCOPY WITH PROPOFOL through colostomy;  Surgeon: Lucilla Lame, MD;  Location: [place];  Service: Endoscopy;  Laterality: N/A;  port-a-cath  . POLYPECTOMY  01/26/2016   Procedure: POLYPECTOMY;  Surgeon: Lucilla Lame, MD;  Location: [place];  Service: Endoscopy;;  . PORTA CATH REMOVAL N/A 01/28/2018   Procedure: PORTA CATH REMOVAL;  Surgeon: Algernon Huxley, MD;  Location: [facility];  Service: Cardiovascular;  Laterality: N/A;  . PORTACATH PLACEMENT    . PROCTECTOMY  04/12/14   [facility] - CA surgery - with colostomy  . SALPINGOOPHORECTOMY Bilateral 04/12/14   [facility] - with CA surg  . VAGINA RECONSTRUCTION SURGERY  04/12/14   [facility] -  transabdonimal rectus abdominus muscle reconstruction  . VAGINAL DELIVERY     2, preterm labor   Family History  Problem Relation Age of Onset  . Arthritis Mother   . Stroke Mother   . Hypertension Mother   . Diabetes Mother   . Arthritis Father   . Heart disease Father 30       s/p CABG  . Stroke Father   . Hypertension Father   . Alcohol abuse Maternal Grandmother   . Diabetes Maternal Grandmother   . Alcohol abuse Maternal Grandfather   . Diabetes Maternal Grandfather   . Cancer Paternal Grandmother        breast  . Heart disease Paternal Grandfather   . Breast cancer Paternal Aunt 13  . Colon cancer Neg Hx     Allergies: Lisinopril Current Outpatient Medications on File Prior to Visit  Medication Sig Dispense Refill  . acetaminophen (TYLENOL) 325 MG tablet Take 325 mg by mouth every 6 (six) hours as needed for mild pain.     Marland Kitchen albuterol (PROVENTIL HFA;VENTOLIN HFA) 108 (90 BASE)  MCG/ACT inhaler Inhale 1-2 puffs into the lungs every 4 (four) hours as needed for wheezing or shortness of breath. 1 Inhaler 3  . amLODipine (NORVASC) 10 MG tablet Take 1 tablet (10 mg total) by mouth daily. 90 tablet 4  . buPROPion (WELLBUTRIN XL) 150 MG 24 hr tablet Start 150 mg ER PO qam, increase after 7 days to 300 mg PO qam 60 tablet 3  . ferrous sulfate 325 (65 FE) MG tablet Take by mouth 2 (two) times daily with a meal.     . FLUoxetine (PROZAC) 20 MG tablet Take 2 tablets (40 mg total) by mouth daily. 120 tablet 3  . fluticasone (FLONASE) 50 MCG/ACT nasal spray Place 2 sprays into both nostrils daily. 16 g 1  . gabapentin (NEURONTIN) 300 MG capsule TAKE ONE CAPSULE BY MOUTH THREE TIMES DAILY. TAKE 1 EXTRA CAPSULE AT BEDTIME 120 capsule 5  . lidocaine-prilocaine (EMLA) cream   0   Current Facility-Administered Medications on File Prior to Visit  Medication Dose Route Frequency Provider Last Rate Last Dose  . heparin lock flush 100 unit/mL  500 Units Intravenous Once Leia Alf, MD       . sodium chloride 0.9 % injection 10 mL  10 mL Intravenous PRN Leia Alf, MD      . sodium chloride 0.9 % injection 10 mL  10 mL Intravenous PRN Leia Alf, MD   10 mL at 11/28/14 1120  . sodium chloride flush (NS) 0.9 % injection 10 mL  10 mL Intravenous PRN Evlyn Kanner, NP   10 mL at 01/03/16 1413  . sodium chloride flush (NS) 0.9 % injection 10 mL  10 mL Intravenous PRN Lloyd Huger, MD   10 mL at 11/15/16 1111  . sodium chloride flush (NS) 0.9 % injection 10 mL  10 mL Intravenous PRN Cammie Sickle, MD   10 mL at 11/24/17 1434    Social History   Tobacco Use  . Smoking status: Never Smoker  . Smokeless tobacco: Never Used  Substance Use Topics  . Alcohol use: No    Comment: rare - holidays  . Drug use: No    Review of Systems  Constitutional: Negative for chills and fever.  Respiratory: Negative for cough.   Cardiovascular: Negative for chest pain and palpitations.  Gastrointestinal: Negative for nausea and vomiting.  Psychiatric/Behavioral: Positive for sleep disturbance. The patient is nervous/anxious.       Objective:    BP 110/80 (BP Location: Left Arm, Patient Position: Sitting, Cuff Size: Large)   Pulse 69   Temp 98.4 F (36.9 C) (Oral)   Resp 16   Ht 5\' 5"  (1.651 m)   Wt 250 lb (113.4 kg)   LMP 12/11/2013   SpO2 98%   BMI 41.60 kg/m  BP Readings from Last 3 Encounters:  03/16/18 110/80  01/28/18 (!) 159/77  01/26/18 (!) 142/88   Wt Readings from Last 3 Encounters:  03/16/18 250 lb (113.4 kg)  01/28/18 246 lb (111.6 kg)  01/26/18 246 lb 3.2 oz (111.7 kg)    Physical Exam  Constitutional: She appears well-developed and well-nourished.  Eyes: Conjunctivae are normal.  Cardiovascular: Normal rate, regular rhythm, normal heart sounds and normal pulses.  Pulmonary/Chest: Effort normal and breath sounds normal. She has no wheezes. She has no rhonchi. She has no rales.  Neurological: She is alert.  Skin: Skin is warm and  dry.  Psychiatric: She has a normal mood and affect. Her speech is normal and  behavior is normal. Thought content normal.  Vitals reviewed.      Assessment & Plan:   Problem List Items Addressed This Visit      Other   Anxiety and depression - Primary    Largely unchanged.  Patient is on Prozac 40 mg, Wellbutrin 300 mg.  Discussed sleep hygiene including her caffeine use, sleeping on a couch etc.  Advised first-line lifestyle modifications.  Patient preference was to go ahead and start trazodone.  Discussed in great length  serotonin syndrome, patient is aware of this and will remain hypervigilant.  Will start low-dose trazodone; also encouraged her to go and start melatonin.  Advised I would like for her to limit her Xanax use.  Close follow-up. Refilled xanax today; I looked up patient on [place] Controlled Substances Reporting System and saw no activity that raised concern of inappropriate use.        Relevant Medications   traZODone (DESYREL) 50 MG tablet   ALPRAZolam (XANAX) 0.25 MG tablet       I am having Tammie Robinson start on traZODone. I am also having her maintain her acetaminophen, ferrous sulfate, lidocaine-prilocaine, albuterol, fluticasone, buPROPion, amLODipine, FLUoxetine, gabapentin, and ALPRAZolam.   Meds ordered this encounter  Medications  . traZODone (DESYREL) 50 MG tablet    Sig: Take 0.5 tablets (25 mg total) by mouth at bedtime.    Dispense:  90 tablet    Refill:  1    Order Specific Question:   Supervising Provider    Answer:   Deborra Medina L [[id number]]  . ALPRAZolam (XANAX) 0.25 MG tablet    Sig: Take 1 tablet (0.25 mg total) by mouth 2 (two) times daily as needed for anxiety.    Dispense:  60 tablet    Refill:  1    Order Specific Question:   Supervising Provider    Answer:   Crecencio Mc [[id number]]    Return precautions given.   Risks, benefits, and alternatives of the medications and treatment plan prescribed today were discussed, and patient  expressed understanding.   Education regarding symptom management and diagnosis given to patient on AVS.  Continue to follow with Burnard Hawthorne, FNP for routine health maintenance.   Tammie Robinson and I agreed with plan.   Tammie Paris, FNP

## 2018-04-17 ENCOUNTER — Encounter: Payer: Self-pay | Admitting: Family

## 2018-04-17 ENCOUNTER — Ambulatory Visit (INDEPENDENT_AMBULATORY_CARE_PROVIDER_SITE_OTHER): Payer: Self-pay | Admitting: Family

## 2018-04-17 VITALS — BP 118/78 | HR 59 | Temp 98.4°F | Resp 16 | Ht 65.0 in | Wt 249.8 lb

## 2018-04-17 DIAGNOSIS — F419 Anxiety disorder, unspecified: Secondary | ICD-10-CM

## 2018-04-17 DIAGNOSIS — F329 Major depressive disorder, single episode, unspecified: Secondary | ICD-10-CM

## 2018-04-17 DIAGNOSIS — G4700 Insomnia, unspecified: Secondary | ICD-10-CM

## 2018-04-17 MED ORDER — SERTRALINE HCL 50 MG PO TABS: 50.0000 mg | ORAL_TABLET | Freq: Every day | ORAL | 3 refills | Status: DC

## 2018-04-17 NOTE — Patient Instructions (Addendum)
When you are ready, you may stop the prozac ,  start the zoloft. Take last dose of prozac and the FOLLOWING day , take your first dose of zoloft AT BEDTIME to see if helps with sleep  Please be cautious with being overly sedated on your regimen and continue to limit xanax.  This is low dose zoloft so we may  need to increase at follow-up.  Follow-up in 3 months

## 2018-04-17 NOTE — Progress Notes (Signed)
Subjective:    Patient ID: Tammie Robinson, female    DOB: 09-18-1965, 52 y.o.   MRN: [id number]  CC: Tammie Robinson is a 52 y.o. female who presents today for follow up.   HPI: Insomnia- doing better on trazodone, not waking up as much once asleep. Still trouble getting to sleep. hasnt tried melatonin. Tried ambien in the past however would eat in  Her sleep.  Taking xanax at bedtime with trazodone.  Gabapentin 300mg  BID and 600mg  qhs which is not sedating for her.   Continues to sleep at mother's house on couch.      Due  mammogram HISTORY:  Past Medical History:  Diagnosis Date  . Anemia    prior to CA dx  . Asthma    seasonal,never hospitalized  . Blood in stool   . Colostomy in place [facility])   . History of ERCP   . Hypertension   . Migraine   . 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 PTSD (post-traumatic stress disorder)   . Rectal cancer ([place])   . Wears contact lenses    Past Surgical History:  Procedure Laterality Date  . ABDOMINAL HYSTERECTOMY  04/12/2014   [facility] with CA surgery; no cervix ( [facility] 01/2017)  . CHOLECYSTECTOMY  2012   Dr. Jamal Collin  . COLONOSCOPY WITH PROPOFOL N/A 01/26/2016   Procedure: COLONOSCOPY WITH PROPOFOL through colostomy;  Surgeon: Lucilla Lame, MD;  Location: [place];  Service: Endoscopy;  Laterality: N/A;  port-a-cath  . POLYPECTOMY  01/26/2016   Procedure: POLYPECTOMY;  Surgeon: Lucilla Lame, MD;  Location: [place];  Service: Endoscopy;;  . PORTA CATH REMOVAL N/A 01/28/2018   Procedure: PORTA CATH REMOVAL;  Surgeon: Algernon Huxley, MD;  Location: [place] [facility];  Service: Cardiovascular;  Laterality: N/A;  . PORTACATH PLACEMENT    . PROCTECTOMY  04/12/14   [facility] - CA surgery - with colostomy  . SALPINGOOPHORECTOMY Bilateral 04/12/14   [facility] - with CA surg  . VAGINA RECONSTRUCTION SURGERY  04/12/14   [facility] - transabdonimal rectus abdominus muscle reconstruction  . VAGINAL DELIVERY     2, preterm labor    Family History  Problem Relation Age of Onset  . Arthritis Mother   . Stroke Mother   . Hypertension Mother   . Diabetes Mother   . Arthritis Father   . Heart disease Father 46       s/p CABG  . Stroke Father   . Hypertension Father   . Alcohol abuse Maternal Grandmother   . Diabetes Maternal Grandmother   . Alcohol abuse Maternal Grandfather   . Diabetes Maternal Grandfather   . Cancer Paternal Grandmother        breast  . Heart disease Paternal Grandfather   . Breast cancer Paternal Aunt 1  . Colon cancer Neg Hx     Allergies: Lisinopril Current Outpatient Medications on File Prior to Visit  Medication Sig Dispense Refill  . acetaminophen (TYLENOL) 325 MG tablet Take 325 mg by mouth every 6 (six) hours as needed for mild pain.     Marland Kitchen albuterol (PROVENTIL HFA;VENTOLIN HFA) 108 (90 BASE) MCG/ACT inhaler Inhale 1-2 puffs into the lungs every 4 (four) hours as needed for wheezing or shortness of breath. 1 Inhaler 3  . ALPRAZolam (XANAX) 0.25 MG tablet Take 1 tablet (0.25 mg total) by mouth 2 (two) times daily as needed for anxiety. 60 tablet 1  . amLODipine (NORVASC) 10  MG tablet Take 1 tablet (10 mg total) by mouth daily. 90 tablet 4  . buPROPion (WELLBUTRIN XL) 150 MG 24 hr tablet Start 150 mg ER PO qam, increase after 7 days to 300 mg PO qam 60 tablet 3  . ferrous sulfate 325 (65 FE) MG tablet Take by mouth 2 (two) times daily with a meal.     . fluticasone (FLONASE) 50 MCG/ACT nasal spray Place 2 sprays into both nostrils daily. 16 g 1  . gabapentin (NEURONTIN) 300 MG capsule TAKE ONE CAPSULE BY MOUTH THREE TIMES DAILY. TAKE 1 EXTRA CAPSULE AT BEDTIME 120 capsule 5  . traZODone (DESYREL) 50 MG tablet Take 0.5 tablets (25 mg total) by mouth at bedtime. 90 tablet 1  . lidocaine-prilocaine (EMLA) cream   0   Current Facility-Administered Medications on File Prior to Visit  Medication Dose Route Frequency Provider Last Rate Last Dose  . heparin lock flush 100 unit/mL  500  Units Intravenous Once Leia Alf, MD      . sodium chloride 0.9 % injection 10 mL  10 mL Intravenous PRN Leia Alf, MD      . sodium chloride 0.9 % injection 10 mL  10 mL Intravenous PRN Leia Alf, MD   10 mL at 11/28/14 1120  . sodium chloride flush (NS) 0.9 % injection 10 mL  10 mL Intravenous PRN Evlyn Kanner, NP   10 mL at 01/03/16 1413  . sodium chloride flush (NS) 0.9 % injection 10 mL  10 mL Intravenous PRN Lloyd Huger, MD   10 mL at 11/15/16 1111  . sodium chloride flush (NS) 0.9 % injection 10 mL  10 mL Intravenous PRN Cammie Sickle, MD   10 mL at 11/24/17 1434    Social History   Tobacco Use  . Smoking status: Never Smoker  . Smokeless tobacco: Never Used  Substance Use Topics  . Alcohol use: No    Comment: rare - holidays  . Drug use: No    Review of Systems  Constitutional: Negative for chills and fever.  Respiratory: Negative for cough.   Cardiovascular: Negative for chest pain and palpitations.  Gastrointestinal: Negative for nausea and vomiting.      Objective:    BP 118/78 (BP Location: Left Arm, Patient Position: Sitting, Cuff Size: Large)   Pulse (!) 59   Temp 98.4 F (36.9 C) (Oral)   Resp 16   Ht 5\' 5"  (1.651 m)   Wt 249 lb 12 oz (113.3 kg)   LMP 12/11/2013   SpO2 97%   BMI 41.56 kg/m  BP Readings from Last 3 Encounters:  04/17/18 118/78  03/16/18 110/80  01/28/18 (!) 159/77   Wt Readings from Last 3 Encounters:  04/17/18 249 lb 12 oz (113.3 kg)  03/16/18 250 lb (113.4 kg)  01/28/18 246 lb (111.6 kg)    Physical Exam  Constitutional: She appears well-developed and well-nourished.  Eyes: Conjunctivae are normal.  Cardiovascular: Normal rate, regular rhythm, normal heart sounds and normal pulses.  Pulmonary/Chest: Effort normal and breath sounds normal. She has no wheezes. She has no rhonchi. She has no rales.  Neurological: She is alert.  Skin: Skin is warm and dry.  Psychiatric: She has a normal mood  and affect. Her speech is normal and behavior is normal. Thought content normal.  Vitals reviewed.      Assessment & Plan:   Problem List Items Addressed This Visit      Other   Anxiety and depression -  Primary   Relevant Medications   sertraline (ZOLOFT) 50 MG tablet   Insomnia    Patient had some improvement on 50 mg trazodone.  Hesitant to increase this dose due to concern of serotonin syndrome which I discussed with patient again todau.  We discussed doing a trial of coming off the Prozac, and switching to Zoloft which may improve her sleep.  Follow-up in 3 months.  Again I emphasized the importance of trying to limit Xanax and to be very cautious as she is on medications that are quite sedating.  Patient verbalized understanding of this.          I have discontinued Markayla Reichart. Ruotolo's FLUoxetine. I am also having her start on sertraline. Additionally, I am having her maintain her acetaminophen, ferrous sulfate, lidocaine-prilocaine, albuterol, fluticasone, buPROPion, amLODipine, gabapentin, traZODone, and ALPRAZolam.   Meds ordered this encounter  Medications  . sertraline (ZOLOFT) 50 MG tablet    Sig: Take 1 tablet (50 mg total) by mouth at bedtime.    Dispense:  90 tablet    Refill:  3    Order Specific Question:   Supervising Provider    Answer:   Crecencio Mc [[id number]]    Return precautions given.   Risks, benefits, and alternatives of the medications and treatment plan prescribed today were discussed, and patient expressed understanding.   Education regarding symptom management and diagnosis given to patient on AVS.  Continue to follow with Burnard Hawthorne, FNP for routine health maintenance.   Lenard Simmer and I agreed with plan.   [name], FNP

## 2018-04-17 NOTE — Assessment & Plan Note (Signed)
Patient had some improvement on 50 mg trazodone.  Hesitant to increase this dose due to concern of serotonin syndrome which I discussed with patient again todau.  We discussed doing a trial of coming off the Prozac, and switching to Zoloft which may improve her sleep.  Follow-up in 3 months.  Again I emphasized the importance of trying to limit Xanax and to be very cautious as she is on medications that are quite sedating.  Patient verbalized understanding of this.

## 2018-05-07 ENCOUNTER — Other Ambulatory Visit: Payer: Self-pay | Admitting: Family

## 2018-05-07 DIAGNOSIS — F329 Major depressive disorder, single episode, unspecified: Secondary | ICD-10-CM

## 2018-05-07 DIAGNOSIS — F419 Anxiety disorder, unspecified: Principal | ICD-10-CM

## 2018-06-07 ENCOUNTER — Other Ambulatory Visit: Payer: Self-pay | Admitting: Family

## 2018-06-07 DIAGNOSIS — F329 Major depressive disorder, single episode, unspecified: Secondary | ICD-10-CM

## 2018-06-07 DIAGNOSIS — F419 Anxiety disorder, unspecified: Principal | ICD-10-CM

## 2018-06-08 NOTE — Telephone Encounter (Signed)
I looked up patient on  Controlled Substances Reporting System and saw no activity that raised concern of inappropriate use.   Refilled as patient takes- 0.25mg  xanax qhs prn, 30 tab

## 2018-06-08 NOTE — Telephone Encounter (Signed)
Last filled 05/08/18 Last office visit 04/17/18 Next office visit 07/27/18

## 2018-06-09 ENCOUNTER — Encounter: Payer: Self-pay | Admitting: Family

## 2018-06-10 ENCOUNTER — Other Ambulatory Visit: Payer: Self-pay | Admitting: Family

## 2018-06-10 DIAGNOSIS — F419 Anxiety disorder, unspecified: Secondary | ICD-10-CM

## 2018-06-10 DIAGNOSIS — J309 Allergic rhinitis, unspecified: Secondary | ICD-10-CM

## 2018-06-10 DIAGNOSIS — F329 Major depressive disorder, single episode, unspecified: Secondary | ICD-10-CM

## 2018-06-10 MED ORDER — ALBUTEROL SULFATE HFA 108 (90 BASE) MCG/ACT IN AERS: 1.0000 | INHALATION_SPRAY | RESPIRATORY_TRACT | 3 refills | Status: DC | PRN

## 2018-06-10 MED ORDER — BUPROPION HCL ER (XL) 300 MG PO TB24: 300.0000 mg | ORAL_TABLET | Freq: Every morning | ORAL | 4 refills | Status: DC

## 2018-06-10 NOTE — Telephone Encounter (Signed)
formlery  prescribed by Dr Gilford Rile ok to refill  lov 04/17/18

## 2018-06-17 ENCOUNTER — Other Ambulatory Visit: Payer: Self-pay | Admitting: Family

## 2018-06-17 DIAGNOSIS — F419 Anxiety disorder, unspecified: Principal | ICD-10-CM

## 2018-06-17 DIAGNOSIS — F329 Major depressive disorder, single episode, unspecified: Secondary | ICD-10-CM

## 2018-06-17 DIAGNOSIS — F32A Depression, unspecified: Secondary | ICD-10-CM

## 2018-06-17 MED ORDER — ALPRAZOLAM 0.25 MG PO TABS: 0.2500 mg | ORAL_TABLET | Freq: Two times a day (BID) | ORAL | 1 refills | Status: DC | PRN

## 2018-07-27 ENCOUNTER — Ambulatory Visit: Payer: Self-pay | Admitting: Family

## 2018-07-29 ENCOUNTER — Telehealth: Payer: Self-pay | Admitting: Family

## 2018-07-29 ENCOUNTER — Encounter: Payer: Self-pay | Admitting: Family

## 2018-07-29 ENCOUNTER — Ambulatory Visit (INDEPENDENT_AMBULATORY_CARE_PROVIDER_SITE_OTHER): Payer: Self-pay | Admitting: Family

## 2018-07-29 DIAGNOSIS — E669 Obesity, unspecified: Secondary | ICD-10-CM | POA: Insufficient documentation

## 2018-07-29 DIAGNOSIS — F419 Anxiety disorder, unspecified: Secondary | ICD-10-CM

## 2018-07-29 DIAGNOSIS — F329 Major depressive disorder, single episode, unspecified: Secondary | ICD-10-CM

## 2018-07-29 MED ORDER — SERTRALINE HCL 100 MG PO TABS: 100.0000 mg | ORAL_TABLET | Freq: Every day | ORAL | 1 refills | Status: DC

## 2018-07-29 NOTE — Telephone Encounter (Signed)
I called & gave patient number to schedule mammogram. I have also scheduled annual exam for April of this year.

## 2018-07-29 NOTE — Assessment & Plan Note (Signed)
Increase zoloft . Advised to again try to limit use of xanax. Close follow up.

## 2018-07-29 NOTE — Telephone Encounter (Signed)
Call pt Meant to tell her this during ov today  Her mammogram is due Please schedule for her  Please also ensure she has CPE scheduled with me

## 2018-07-29 NOTE — Progress Notes (Signed)
Subjective:    Patient ID: Tammie Robinson, female    DOB: October 22, 1965, 53 y.o.   MRN: [id number]  CC: Tammie Robinson is a 53 y.o. female who presents today for follow up.   HPI: Overall doing well. No new complaints today.   Depression- on zoloft right before bedtime. Cannot tell huge difference on medication.  ,not on prozac.   Not taking  Trazodone regularly. Using xanax BID.   Sleeping at mom's. Trouble falling asleep. 6 hrs or less is average for sleep. Sleeping on couch, TV on.   20 lb weight loss in past 4 months. Limiting dr pepper, not snacking anymore. Started wellbutrin 04/2018.   H/o rectal cancer- Continues to folow with oncology. No night sweats, bone pain.  Mammogram due   HISTORY:  Past Medical History:  Diagnosis Date  . Anemia    prior to CA dx  . Asthma    seasonal,never hospitalized  . Blood in stool   . Colostomy in place [facility])   . History of ERCP   . Hypertension   . Migraine   . 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 PTSD (post-traumatic stress disorder)   . Rectal cancer ([place])   . Wears contact lenses    Past Surgical History:  Procedure Laterality Date  . ABDOMINAL HYSTERECTOMY  04/12/2014   [facility] with CA surgery; no cervix ( M.Augustus Zurawski 01/2017)  . CHOLECYSTECTOMY  2012   Dr. Jamal Collin  . COLONOSCOPY WITH PROPOFOL N/A 01/26/2016   Procedure: COLONOSCOPY WITH PROPOFOL through colostomy;  Surgeon: Lucilla Lame, MD;  Location: [place];  Service: Endoscopy;  Laterality: N/A;  port-a-cath  . POLYPECTOMY  01/26/2016   Procedure: POLYPECTOMY;  Surgeon: Lucilla Lame, MD;  Location: [place];  Service: Endoscopy;;  . PORTA CATH REMOVAL N/A 01/28/2018   Procedure: PORTA CATH REMOVAL;  Surgeon: Algernon Huxley, MD;  Location: [place] [facility];  Service: Cardiovascular;  Laterality: N/A;  . PORTACATH PLACEMENT    . PROCTECTOMY  04/12/14   [facility] - CA surgery - with colostomy  . SALPINGOOPHORECTOMY Bilateral 04/12/14   [facility] - with CA  surg  . VAGINA RECONSTRUCTION SURGERY  04/12/14   [facility] - transabdonimal rectus abdominus muscle reconstruction  . VAGINAL DELIVERY     2, preterm labor   Family History  Problem Relation Age of Onset  . Arthritis Mother   . Stroke Mother   . Hypertension Mother   . Diabetes Mother   . Arthritis Father   . Heart disease Father 68       s/p CABG  . Stroke Father   . Hypertension Father   . Alcohol abuse Maternal Grandmother   . Diabetes Maternal Grandmother   . Alcohol abuse Maternal Grandfather   . Diabetes Maternal Grandfather   . Cancer Paternal Grandmother        breast  . Heart disease Paternal Grandfather   . Breast cancer Paternal Aunt 98  . Colon cancer Neg Hx     Allergies: Lisinopril Current Outpatient Medications on File Prior to Visit  Medication Sig Dispense Refill  . acetaminophen (TYLENOL) 325 MG tablet Take 325 mg by mouth every 6 (six) hours as needed for mild pain.     Marland Kitchen albuterol (PROVENTIL HFA;VENTOLIN HFA) 108 (90 Base) MCG/ACT inhaler Inhale 1-2 puffs into the lungs every 4 (four) hours as needed for wheezing or shortness of breath. 1 Inhaler 3  . ALPRAZolam (XANAX) 0.25 MG  tablet Take 1 tablet (0.25 mg total) by mouth 2 (two) times daily as needed for anxiety. for anxiety 60 tablet 1  . amLODipine (NORVASC) 10 MG tablet Take 1 tablet (10 mg total) by mouth daily. 90 tablet 4  . buPROPion (WELLBUTRIN XL) 300 MG 24 hr tablet Take 1 tablet (300 mg total) by mouth every morning. 90 tablet 4  . ferrous sulfate 325 (65 FE) MG tablet Take by mouth 2 (two) times daily with a meal.     . fluticasone (FLONASE) 50 MCG/ACT nasal spray Place 2 sprays into both nostrils daily. 16 g 1  . gabapentin (NEURONTIN) 300 MG capsule TAKE ONE CAPSULE BY MOUTH THREE TIMES DAILY. TAKE 1 EXTRA CAPSULE AT BEDTIME 120 capsule 5   Current Facility-Administered Medications on File Prior to Visit  Medication Dose Route Frequency Provider Last Rate Last Dose  . heparin lock flush 100  unit/mL  500 Units Intravenous Once Leia Alf, MD      . sodium chloride 0.9 % injection 10 mL  10 mL Intravenous PRN Leia Alf, MD      . sodium chloride 0.9 % injection 10 mL  10 mL Intravenous PRN Leia Alf, MD   10 mL at 11/28/14 1120  . sodium chloride flush (NS) 0.9 % injection 10 mL  10 mL Intravenous PRN Evlyn Kanner, NP   10 mL at 01/03/16 1413  . sodium chloride flush (NS) 0.9 % injection 10 mL  10 mL Intravenous PRN Lloyd Huger, MD   10 mL at 11/15/16 1111  . sodium chloride flush (NS) 0.9 % injection 10 mL  10 mL Intravenous PRN Cammie Sickle, MD   10 mL at 11/24/17 1434    Social History   Tobacco Use  . Smoking status: Never Smoker  . Smokeless tobacco: Never Used  Substance Use Topics  . Alcohol use: No    Comment: rare - holidays  . Drug use: No    Review of Systems  Constitutional: Positive for appetite change. Negative for activity change, chills, fever and unexpected weight change.  Respiratory: Negative for cough.   Cardiovascular: Negative for chest pain and palpitations.  Gastrointestinal: Negative for nausea and vomiting.  Psychiatric/Behavioral: Positive for sleep disturbance. Negative for suicidal ideas. The patient is not nervous/anxious.       Objective:    BP 120/72 (BP Location: Left Arm, Patient Position: Sitting, Cuff Size: Large)   Pulse 78   Temp 98 F (36.7 C)   Wt 229 lb (103.9 kg)   LMP 12/11/2013   SpO2 96%   BMI 38.11 kg/m  BP Readings from Last 3 Encounters:  07/29/18 120/72  04/17/18 118/78  03/16/18 110/80   Wt Readings from Last 3 Encounters:  07/29/18 229 lb (103.9 kg)  04/17/18 249 lb 12 oz (113.3 kg)  03/16/18 250 lb (113.4 kg)    Physical Exam Vitals signs reviewed.  Constitutional:      Appearance: She is well-developed.  Eyes:     Conjunctiva/sclera: Conjunctivae normal.  Cardiovascular:     Rate and Rhythm: Normal rate and regular rhythm.     Pulses: Normal pulses.      Heart sounds: Normal heart sounds.  Pulmonary:     Effort: Pulmonary effort is normal.     Breath sounds: Normal breath sounds. No wheezing, rhonchi or rales.  Skin:    General: Skin is warm and dry.  Neurological:     Mental Status: She is alert.  Psychiatric:  Speech: Speech normal.        Behavior: Behavior normal.        Thought Content: Thought content normal.        Assessment & Plan:   Problem List Items Addressed This Visit      Other   Anxiety and depression    Increase zoloft . Advised to again try to limit use of xanax. Close follow up.       Relevant Medications   sertraline (ZOLOFT) 100 MG tablet   Obesity (BMI 35.0-39.9 without comorbidity)    Pleased with weight loss on wellbutrin; suspect coming off prozac may be helpful as well. Advised to continue close follow up /surveillance with oncology which she understood. At this time suspect dietary changes, wellbutrin have contributed to weight loss. Patient will let me know if she experiences worsening of fatigue ( chronic) or new symptoms which may rise concern for malignancy, particularly with her history. Patient very understanding of this and felt her weight loss was from lifestyle. Will follow.           I have discontinued Crestina Strike. Stickley's lidocaine-prilocaine and traZODone. I have also changed her sertraline. Additionally, I am having her maintain her acetaminophen, ferrous sulfate, fluticasone, amLODipine, gabapentin, buPROPion, albuterol, and ALPRAZolam.   Meds ordered this encounter  Medications  . sertraline (ZOLOFT) 100 MG tablet    Sig: Take 1 tablet (100 mg total) by mouth at bedtime.    Dispense:  90 tablet    Refill:  1    Order Specific Question:   Supervising Provider    Answer:   Crecencio Mc [[id number]]    Return precautions given.   Risks, benefits, and alternatives of the medications and treatment plan prescribed today were discussed, and patient expressed understanding.    Education regarding symptom management and diagnosis given to patient on AVS.  Continue to follow with Burnard Hawthorne, FNP for routine health maintenance.   Lenard Simmer and I agreed with plan.   [name], FNP

## 2018-07-29 NOTE — Assessment & Plan Note (Signed)
Pleased with weight loss on wellbutrin; suspect coming off prozac may be helpful as well. Advised to continue close follow up /surveillance with oncology which she understood. At this time suspect dietary changes, wellbutrin have contributed to weight loss. Patient will let me know if she experiences worsening of fatigue ( chronic) or new symptoms which may rise concern for malignancy, particularly with her history. Patient very understanding of this and felt her weight loss was from lifestyle. Will follow.

## 2018-07-29 NOTE — Patient Instructions (Addendum)
Start zoloft 100mg  , and take earlier, at  Least 2 hours before bed.  Stop trazodone.   When I see you next, goal is 224 . You can do it!!!!  This is  Dr. Lupita Dawn  example of a  "Low GI"  Diet:  It will allow you to lose 4 to 8  lbs  per month if you follow it carefully.  Your goal with exercise is a minimum of 30 minutes of aerobic exercise 5 days per week (Walking does not count once it becomes easy!)    All of the foods can be found at grocery stores and in bulk at [employer].  The Atkins protein bars and shakes are available in more varieties at [employer], [employer] and [employer].     7 AM Breakfast:  Choose from the following:  Low carbohydrate Protein  Shakes (I recommend the  Premier Protein chocolate shakes,  EAS AdvantEdge "Carb Control" shakes  Or the Atkins shakes all are under 3 net carbs)     a scrambled egg/bacon/cheese burrito made with Mission's "carb balance" whole wheat tortilla  (about 10 net carbs )  Regulatory affairs officer (basically a quiche without the pastry crust) that is eaten cold and very convenient way to get your eggs.  8 carbs)  If you make your own protein shakes, avoid bananas and pineapple,  And use low carb greek yogurt or original /unsweetened almond or soy milk    Avoid cereal and bananas, oatmeal and cream of wheat and grits. They are loaded with carbohydrates!   10 AM: high protein snack:  Protein bar by Atkins (the snack size, under 200 cal, usually < 6 net carbs).    A stick of cheese:  Around 1 carb,  100 cal     Dannon Light n Fit [place] Yogurt  (80 cal, 8 carbs)  Other so called "protein bars" and Greek yogurts tend to be loaded with carbohydrates.  Remember, in food advertising, the word "energy" is synonymous for " carbohydrate."  Lunch:   A Sandwich using the bread choices listed, Can use any  Eggs,  lunchmeat, grilled meat or canned tuna), avocado, regular mayo/mustard  and cheese.  A Salad using blue cheese, ranch,  Goddess or  vinagrette,  Avoid taco shells, croutons or "confetti" and no "candied nuts" but regular nuts OK.   No pretzels, nabs  or chips.  Pickles and miniature sweet peppers are a good low carb alternative that provide a "crunch"  The bread is the only source of carbohydrate in a sandwich and  can be decreased by trying some of the attached alternatives to traditional loaf bread   Avoid "Low fat dressings, as well as [place] dressings They are loaded with sugar!   3 PM/ Mid day  Snack:  Consider  1 ounce of  almonds, walnuts, pistachios, pecans, peanuts,  Macadamia nuts or a nut medley.  Avoid "granola and granola bars "  Mixed nuts are ok in moderation as long as there are no raisins,  cranberries or dried fruit.   KIND bars are OK if you get the low glycemic index variety   Try the prosciutto/mozzarella cheese sticks by [employer]  In deli /backery section   High protein      6 PM  Dinner:     Meat/fowl/fish with a green salad, and either broccoli, cauliflower, green beans, spinach, brussel sprouts or  Lima beans. DO NOT BREAD THE PROTEIN!!  There is a low carb pasta by Dreamfield's that is acceptable and tastes great: only 5 digestible carbs/serving.( All grocery stores but BJs carry it ) Several ready made meals are available low carb:   Try Michel Angelo's chicken piccata or chicken or eggplant parm over low carb pasta.(Lowes and BJs)   Marjory Lies Sanchez's "Carnitas" (pulled pork, no sauce,  0 carbs) or his beef pot roast to make a dinner burrito (at [employer]'s)  Pesto over low carb pasta (bj's sells a good quality pesto in the center refrigerated section of the deli   Try satueeing  Cheral Marker with mushroooms as a good side   [employer] Giant makes a mashed cauliflower that tastes like mashed potatoes  Whole wheat pasta is still full of digestible carbs and  Not as low in glycemic index as Dreamfield's.   Brown rice is still rice,  So skip the rice and noodles if you eat [place] or [place]  (or at least limit to 1/2 cup)  9 PM snack :   Breyer's "low carb" fudgsicle or  ice cream bar (Carb Smart line), or  Weight Watcher's ice cream bar , or another "no sugar added" ice cream;  a serving of fresh berries/cherries with whipped cream   Cheese or DANNON'S LlGHT N FIT GREEK YOGURT  8 ounces of [place] unsweetened almond/cococunut milk    Treat yourself to a parfait made with whipped cream blueberiies, walnuts and vanilla greek yogurt  Avoid bananas, pineapple, grapes  and watermelon on a regular basis because they are high in sugar.  THINK OF THEM AS DESSERT  Remember that snack Substitutions should be less than 10 NET carbs per serving and meals < 20 carbs. Remember to subtract fiber grams to get the "net carbs."  @TULLOBREADPACKAGE @  o

## 2018-08-17 ENCOUNTER — Ambulatory Visit (INDEPENDENT_AMBULATORY_CARE_PROVIDER_SITE_OTHER): Payer: Self-pay | Admitting: Family

## 2018-08-17 ENCOUNTER — Encounter: Payer: Self-pay | Admitting: Family

## 2018-08-17 VITALS — BP 130/74 | HR 86 | Temp 99.2°F | Wt 226.0 lb

## 2018-08-17 DIAGNOSIS — L0390 Cellulitis, unspecified: Secondary | ICD-10-CM

## 2018-08-17 DIAGNOSIS — R011 Cardiac murmur, unspecified: Secondary | ICD-10-CM | POA: Insufficient documentation

## 2018-08-17 MED ORDER — CEPHALEXIN 500 MG PO CAPS: 500.0000 mg | ORAL_CAPSULE | Freq: Four times a day (QID) | ORAL | 0 refills | Status: AC

## 2018-08-17 NOTE — Assessment & Plan Note (Signed)
New.  Currently patient is asymptomatic.  She politely declines cardiology consult at this time.  We will start with echocardiogram.

## 2018-08-17 NOTE — Patient Instructions (Signed)
Start Keflex  Please let me know of any new or worsening signs,  I am concerned you have an early skin infection (cellulitis).   Today we discussed referrals, orders. echo  I have placed these orders in the system for you.  Please be sure to give [place] a call if you have not heard from our office regarding this. We should hear from [place] within ONE week with information regarding your appointment. If not, please let me know immediately.     Cellulitis, Adult  Cellulitis is a skin infection. The infected area is often warm, red, swollen, and sore. It occurs most often in the arms and lower legs. It is very important to get treated for this condition. What are the causes? This condition is caused by bacteria. The bacteria enter through a break in the skin, such as a cut, burn, insect bite, open sore, or crack. What increases the risk? This condition is more likely to occur in people who:  Have a weak body defense system (immune system).  Have open cuts, burns, bites, or scrapes on the skin.  Are older than 53 years of age.  Have a blood sugar problem (diabetes).  Have a long-lasting (chronic) liver disease (cirrhosis) or kidney disease.  Are very overweight (obese).  Have a skin problem, such as: ? Itchy rash (eczema). ? Slow movement of blood in the veins (venous stasis). ? Fluid buildup below the skin (edema).  Have been treated with high-energy rays (radiation).  Use IV drugs. What are the signs or symptoms? Symptoms of this condition include:  Skin that is: ? Red. ? Streaking. ? Spotting. ? Swollen. ? Sore or painful when you touch it. ? Warm.  A fever.  Chills.  Blisters. How is this diagnosed? This condition is diagnosed based on:  Medical history.  Physical exam.  Blood tests.  Imaging tests. How is this treated? Treatment for this condition may include:  Medicines to treat infections or allergies.  Home care, such as: ? Rest. ? Placing cold or  warm cloths (compresses) on the skin.  Hospital care, if the condition is very bad. Follow these instructions at home: Medicines  Take over-the-counter and prescription medicines only as told by your doctor.  If you were prescribed an antibiotic medicine, take it as told by your doctor. Do not stop taking it even if you start to feel better. General instructions   Drink enough fluid to keep your pee (urine) pale yellow.  Do not touch or rub the infected area.  Raise (elevate) the infected area above the level of your heart while you are sitting or lying down.  Place cold or warm cloths on the area as told by your doctor.  Keep all follow-up visits as told by your doctor. This is important. Contact a doctor if:  You have a fever.  You do not start to get better after 1-2 days of treatment.  Your bone or joint under the infected area starts to hurt after the skin has healed.  Your infection comes back. This can happen in the same area or another area.  You have a swollen bump in the area.  You have new symptoms.  You feel ill and have muscle aches and pains. Get help right away if:  Your symptoms get worse.  You feel very sleepy.  You throw up (vomit) or have watery poop (diarrhea) for a long time.  You see red streaks coming from the area.  Your red area gets  larger.  Your red area turns dark in color. These symptoms may represent a serious problem that is an emergency. Do not wait to see if the symptoms will go away. Get medical help right away. Call your local emergency services (911 in the [place] Do not drive yourself to the hospital. Summary  Cellulitis is a skin infection. The area is often warm, red, swollen, and sore.  This condition is treated with medicines, rest, and cold and warm cloths.  Take all medicines only as told by your doctor.  Tell your doctor if symptoms do not start to get better after 1-2 days of treatment. This information is not  intended to replace advice given to you by your health care provider. Make sure you discuss any questions you have with your health care provider. Document Released: 12/25/2007 Document Revised: 11/27/2017 Document Reviewed: 11/27/2017 Elsevier Interactive Patient Education  2019 [employer].

## 2018-08-17 NOTE — Progress Notes (Signed)
Subjective:    Patient ID: Tammie Robinson, female    DOB: [birth year], 53 y.o.   MRN: [id number]  CC: Tammie Robinson is a 53 y.o. female who presents today for an acute visit.    HPI: Chief complaint of rash on right flank of stomach, x one week, moving toward umbilicus. Feels warm.  Raised.  described as sore. No history of cellulitis. No h/o mrsa. Hasnt tried any medications. No fever, nausea.  States otherwise, she feels well today. No known history of murmur.  Denies dizziness, shortness of breath, palpitations.   HISTORY:  Past Medical History:  Diagnosis Date  . Anemia    prior to CA dx  . Asthma    seasonal,never hospitalized  . Blood in stool   . Colostomy in place [facility])   . History of ERCP   . Hypertension   . Migraine   . 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 PTSD (post-traumatic stress disorder)   . Rectal cancer ([place])   . Wears contact lenses    Past Surgical History:  Procedure Laterality Date  . ABDOMINAL HYSTERECTOMY  04/12/2014   [facility] with CA surgery; no cervix ( [facility] 01/2017)  . CHOLECYSTECTOMY  2012   Dr. Jamal Collin  . COLONOSCOPY WITH PROPOFOL N/A 01/26/2016   Procedure: COLONOSCOPY WITH PROPOFOL through colostomy;  Surgeon: Lucilla Lame, MD;  Location: [place];  Service: Endoscopy;  Laterality: N/A;  port-a-cath  . POLYPECTOMY  01/26/2016   Procedure: POLYPECTOMY;  Surgeon: Lucilla Lame, MD;  Location: [place];  Service: Endoscopy;;  . PORTA CATH REMOVAL N/A 01/28/2018   Procedure: PORTA CATH REMOVAL;  Surgeon: Algernon Huxley, MD;  Location: [place] [facility];  Service: Cardiovascular;  Laterality: N/A;  . PORTACATH PLACEMENT    . PROCTECTOMY  04/12/14   [facility] - CA surgery - with colostomy  . SALPINGOOPHORECTOMY Bilateral 04/12/14   [facility] - with CA surg  . VAGINA RECONSTRUCTION SURGERY  04/12/14   [facility] - transabdonimal rectus abdominus muscle reconstruction  . VAGINAL DELIVERY     2, preterm labor   Family History    Problem Relation Age of Onset  . Arthritis Mother   . Stroke Mother   . Hypertension Mother   . Diabetes Mother   . Arthritis Father   . Heart disease Father 73       s/p CABG  . Stroke Father   . Hypertension Father   . Alcohol abuse Maternal Grandmother   . Diabetes Maternal Grandmother   . Alcohol abuse Maternal Grandfather   . Diabetes Maternal Grandfather   . Cancer Paternal Grandmother        breast  . Heart disease Paternal Grandfather   . Breast cancer Paternal Aunt 54  . Colon cancer Neg Hx     Allergies: Lisinopril Current Outpatient Medications on File Prior to Visit  Medication Sig Dispense Refill  . acetaminophen (TYLENOL) 325 MG tablet Take 325 mg by mouth every 6 (six) hours as needed for mild pain.     Marland Kitchen albuterol (PROVENTIL HFA;VENTOLIN HFA) 108 (90 Base) MCG/ACT inhaler Inhale 1-2 puffs into the lungs every 4 (four) hours as needed for wheezing or shortness of breath. 1 Inhaler 3  . ALPRAZolam (XANAX) 0.25 MG tablet Take 1 tablet (0.25 mg total) by mouth 2 (two) times daily as needed for anxiety. for anxiety 60 tablet 1  . amLODipine (NORVASC) 10 MG tablet Take 1 tablet (10 mg  total) by mouth daily. 90 tablet 4  . buPROPion (WELLBUTRIN XL) 300 MG 24 hr tablet Take 1 tablet (300 mg total) by mouth every morning. 90 tablet 4  . ferrous sulfate 325 (65 FE) MG tablet Take by mouth 2 (two) times daily with a meal.     . fluticasone (FLONASE) 50 MCG/ACT nasal spray Place 2 sprays into both nostrils daily. 16 g 1  . gabapentin (NEURONTIN) 300 MG capsule TAKE ONE CAPSULE BY MOUTH THREE TIMES DAILY. TAKE 1 EXTRA CAPSULE AT BEDTIME 120 capsule 5  . sertraline (ZOLOFT) 100 MG tablet Take 1 tablet (100 mg total) by mouth at bedtime. 90 tablet 1   Current Facility-Administered Medications on File Prior to Visit  Medication Dose Route Frequency Provider Last Rate Last Dose  . heparin lock flush 100 unit/mL  500 Units Intravenous Once Leia Alf, MD      . sodium  chloride 0.9 % injection 10 mL  10 mL Intravenous PRN Leia Alf, MD      . sodium chloride 0.9 % injection 10 mL  10 mL Intravenous PRN Leia Alf, MD   10 mL at 11/28/14 1120  . sodium chloride flush (NS) 0.9 % injection 10 mL  10 mL Intravenous PRN Evlyn Kanner, NP   10 mL at 01/03/16 1413  . sodium chloride flush (NS) 0.9 % injection 10 mL  10 mL Intravenous PRN Lloyd Huger, MD   10 mL at 11/15/16 1111  . sodium chloride flush (NS) 0.9 % injection 10 mL  10 mL Intravenous PRN Cammie Sickle, MD   10 mL at 11/24/17 1434    Social History   Tobacco Use  . Smoking status: Never Smoker  . Smokeless tobacco: Never Used  Substance Use Topics  . Alcohol use: No    Comment: rare - holidays  . Drug use: No    Review of Systems  Constitutional: Negative for chills and fever.  Respiratory: Negative for cough and shortness of breath.   Cardiovascular: Negative for chest pain and palpitations.  Gastrointestinal: Negative for nausea and vomiting.  Skin: Positive for rash.      Objective:    BP 130/74 (BP Location: Left Arm, Patient Position: Sitting, Cuff Size: Large)   Pulse 86   Temp 99.2 F (37.3 C)   Wt 226 lb (102.5 kg)   LMP 12/11/2013   SpO2 97%   BMI 37.61 kg/m    Physical Exam Vitals signs reviewed.  Constitutional:      Appearance: She is well-developed.  Eyes:     Conjunctiva/sclera: Conjunctivae normal.  Cardiovascular:     Rate and Rhythm: Normal rate and regular rhythm.     Pulses: Normal pulses.     Heart sounds: Murmur present. Systolic murmur present with a grade of 2/6.     Comments: SEM II/VI, Loudest LSB, non radiating, no thrill  Pulmonary:     Effort: Pulmonary effort is normal.     Breath sounds: Normal breath sounds. No wheezing, rhonchi or rales.  Skin:    General: Skin is warm and dry.          Comments: Contiguous well circumscribed hypererythematous raisedarea approximately 5 to 6 cm in length extending towards  umbilicus.  No vesicular lesions.  No purulent discharge.  No increased warmth.  No streaking  Neurological:     Mental Status: She is alert.  Psychiatric:        Speech: Speech normal.  Behavior: Behavior normal.        Thought Content: Thought content normal.        Assessment & Plan:   Problem List Items Addressed This Visit      Other   Cellulitis - Primary    Patient is nontoxic in appearance, no systemic features.  She is afebrile.  Concern for early cellulitis.  Will treat empirically with Keflex.  Close follow-up and patient let me know how she is doing from home.      Relevant Medications   cephALEXin (KEFLEX) 500 MG capsule   Murmur    New.  Currently patient is asymptomatic.  She politely declines cardiology consult at this time.  We will start with echocardiogram.      Relevant Orders   ECHOCARDIOGRAM COMPLETE        I am having Tammie Robinson start on cephALEXin. I am also having her maintain her acetaminophen, ferrous sulfate, fluticasone, amLODipine, gabapentin, buPROPion, albuterol, ALPRAZolam, and sertraline.   Meds ordered this encounter  Medications  . cephALEXin (KEFLEX) 500 MG capsule    Sig: Take 1 capsule (500 mg total) by mouth 4 (four) times daily for 5 days.    Dispense:  20 capsule    Refill:  0    Order Specific Question:   Supervising Provider    Answer:   Crecencio Mc [[id number]]    Return precautions given.   Risks, benefits, and alternatives of the medications and treatment plan prescribed today were discussed, and patient expressed understanding.   Education regarding symptom management and diagnosis given to patient on AVS.  Continue to follow with Burnard Hawthorne, FNP for routine health maintenance.   Tammie Robinson and I agreed with plan.   [name], FNP

## 2018-08-17 NOTE — Assessment & Plan Note (Signed)
Patient is nontoxic in appearance, no systemic features.  She is afebrile.  Concern for early cellulitis.  Will treat empirically with Keflex.  Close follow-up and patient let me know how she is doing from home.

## 2018-08-21 ENCOUNTER — Encounter: Payer: Self-pay | Admitting: Family

## 2018-08-21 ENCOUNTER — Other Ambulatory Visit: Payer: Self-pay | Admitting: Family

## 2018-08-21 DIAGNOSIS — L0390 Cellulitis, unspecified: Secondary | ICD-10-CM

## 2018-08-21 MED ORDER — CLOTRIMAZOLE 1 % EX CREA: 1.0000 "application " | TOPICAL_CREAM | Freq: Two times a day (BID) | CUTANEOUS | 1 refills | Status: DC

## 2018-08-24 ENCOUNTER — Other Ambulatory Visit: Payer: Self-pay | Admitting: Internal Medicine

## 2018-08-24 DIAGNOSIS — C20 Malignant neoplasm of rectum: Secondary | ICD-10-CM

## 2018-08-25 ENCOUNTER — Encounter: Payer: Self-pay | Admitting: Family

## 2018-08-27 ENCOUNTER — Telehealth: Payer: Self-pay

## 2018-08-27 NOTE — Telephone Encounter (Signed)
I called patient to ask that she call back to make an appointment regarding mychart message 08/25/18.

## 2018-08-28 ENCOUNTER — Ambulatory Visit (INDEPENDENT_AMBULATORY_CARE_PROVIDER_SITE_OTHER): Payer: Self-pay | Admitting: Family

## 2018-08-28 ENCOUNTER — Encounter: Payer: Self-pay | Admitting: Family

## 2018-08-28 VITALS — BP 110/80 | HR 85 | Temp 97.0°F | Ht 65.0 in | Wt 224.6 lb

## 2018-08-28 DIAGNOSIS — L0291 Cutaneous abscess, unspecified: Secondary | ICD-10-CM

## 2018-08-28 MED ORDER — CEPHALEXIN 500 MG PO CAPS: 500.0000 mg | ORAL_CAPSULE | Freq: Four times a day (QID) | ORAL | 0 refills | Status: DC

## 2018-08-28 NOTE — Patient Instructions (Signed)
Please do 1 more round of the Keflex.  Warm compresses.  Let me know if does not continue to improve.

## 2018-08-28 NOTE — Progress Notes (Signed)
Subjective:    Patient ID: Tammie Robinson, female    DOB: 1966/03/24, 53 y.o.   MRN: [id number]  CC: Tammie Robinson is a 53 y.o. female who presents today for an acute visit.    HPI: rash started draining purulent yellow, mixed with blood x 6 days ago.  Rash continues to improve, redness, warmth has improved. Scant amount of drainage.  One area of skin feeling firm around open wound.   No fever. Overall feels well today Completed keflex 4 days ago.   No prior history of abscess.   HISTORY:  Past Medical History:  Diagnosis Date  . Anemia    prior to CA dx  . Asthma    seasonal,never hospitalized  . Blood in stool   . Colostomy in place [facility])   . History of ERCP   . Hypertension   . Migraine   . 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 PTSD (post-traumatic stress disorder)   . Rectal cancer ([place])   . Wears contact lenses    Past Surgical History:  Procedure Laterality Date  . ABDOMINAL HYSTERECTOMY  04/12/2014   [facility] with CA surgery; no cervix ( [facility] [place] 01/2017)  . CHOLECYSTECTOMY  2012   Dr. Jamal Collin  . COLONOSCOPY WITH PROPOFOL N/A 01/26/2016   Procedure: COLONOSCOPY WITH PROPOFOL through colostomy;  Surgeon: Lucilla Lame, MD;  Location: [place];  Service: Endoscopy;  Laterality: N/A;  port-a-cath  . POLYPECTOMY  01/26/2016   Procedure: POLYPECTOMY;  Surgeon: Lucilla Lame, MD;  Location: [place];  Service: Endoscopy;;  . PORTA CATH REMOVAL N/A 01/28/2018   Procedure: PORTA CATH REMOVAL;  Surgeon: Algernon Huxley, MD;  Location: [place] [facility];  Service: Cardiovascular;  Laterality: N/A;  . PORTACATH PLACEMENT    . PROCTECTOMY  04/12/14   [facility] - CA surgery - with colostomy  . SALPINGOOPHORECTOMY Bilateral 04/12/14   [facility] - with CA surg  . VAGINA RECONSTRUCTION SURGERY  04/12/14   [facility] - transabdonimal rectus abdominus muscle reconstruction  . VAGINAL DELIVERY     2, preterm labor   Family History  Problem Relation Age of Onset  .  Arthritis Mother   . Stroke Mother   . Hypertension Mother   . Diabetes Mother   . Arthritis Father   . Heart disease Father 38       s/p CABG  . Stroke Father   . Hypertension Father   . Alcohol abuse Maternal Grandmother   . Diabetes Maternal Grandmother   . Alcohol abuse Maternal Grandfather   . Diabetes Maternal Grandfather   . Cancer Paternal Grandmother        breast  . Heart disease Paternal Grandfather   . Breast cancer Paternal Aunt 15  . Colon cancer Neg Hx     Allergies: Lisinopril Current Outpatient Medications on File Prior to Visit  Medication Sig Dispense Refill  . acetaminophen (TYLENOL) 325 MG tablet Take 325 mg by mouth every 6 (six) hours as needed for mild pain.     Marland Kitchen albuterol (PROVENTIL HFA;VENTOLIN HFA) 108 (90 Base) MCG/ACT inhaler Inhale 1-2 puffs into the lungs every 4 (four) hours as needed for wheezing or shortness of breath. 1 Inhaler 3  . ALPRAZolam (XANAX) 0.25 MG tablet Take 1 tablet (0.25 mg total) by mouth 2 (two) times daily as needed for anxiety. for anxiety 60 tablet 1  . amLODipine (NORVASC) 10 MG tablet Take 1 tablet (10 mg total) by  mouth daily. 90 tablet 4  . buPROPion (WELLBUTRIN XL) 300 MG 24 hr tablet Take 1 tablet (300 mg total) by mouth every morning. 90 tablet 4  . clotrimazole (LOTRIMIN) 1 % cream Apply 1 application topically 2 (two) times daily. 30 g 1  . ferrous sulfate 325 (65 FE) MG tablet Take by mouth 2 (two) times daily with a meal.     . fluticasone (FLONASE) 50 MCG/ACT nasal spray Place 2 sprays into both nostrils daily. 16 g 1  . gabapentin (NEURONTIN) 300 MG capsule TAKE ONE CAPSULE BY MOUTH THREE TIMES DAILY & ONE CAPSULE AT BEDTIME 120 capsule 5  . sertraline (ZOLOFT) 100 MG tablet Take 1 tablet (100 mg total) by mouth at bedtime. 90 tablet 1   Current Facility-Administered Medications on File Prior to Visit  Medication Dose Route Frequency Provider Last Rate Last Dose  . heparin lock flush 100 unit/mL  500 Units  Intravenous Once Leia Alf, MD      . sodium chloride 0.9 % injection 10 mL  10 mL Intravenous PRN Leia Alf, MD      . sodium chloride 0.9 % injection 10 mL  10 mL Intravenous PRN Leia Alf, MD   10 mL at 11/28/14 1120  . sodium chloride flush (NS) 0.9 % injection 10 mL  10 mL Intravenous PRN Evlyn Kanner, NP   10 mL at 01/03/16 1413  . sodium chloride flush (NS) 0.9 % injection 10 mL  10 mL Intravenous PRN Lloyd Huger, MD   10 mL at 11/15/16 1111  . sodium chloride flush (NS) 0.9 % injection 10 mL  10 mL Intravenous PRN Cammie Sickle, MD   10 mL at 11/24/17 1434    Social History   Tobacco Use  . Smoking status: Never Smoker  . Smokeless tobacco: Never Used  Substance Use Topics  . Alcohol use: No    Comment: rare - holidays  . Drug use: No    Review of Systems  Constitutional: Negative for chills and fever.  Respiratory: Negative for cough.   Cardiovascular: Negative for chest pain and palpitations.  Gastrointestinal: Negative for nausea and vomiting.  Skin: Positive for rash and wound.      Objective:    BP 110/80 (BP Location: Left Arm, Patient Position: Sitting, Cuff Size: Large)   Pulse 85   Temp (!) 97 F (36.1 C) (Oral)   Ht 5\' 5"  (1.651 m)   Wt 224 lb 9.6 oz (101.9 kg)   LMP 12/11/2013   SpO2 98%   BMI 37.38 kg/m    Physical Exam Vitals signs reviewed.  Constitutional:      Appearance: She is well-developed.  Eyes:     Conjunctiva/sclera: Conjunctivae normal.  Cardiovascular:     Rate and Rhythm: Normal rate and regular rhythm.     Pulses: Normal pulses.     Heart sounds: Normal heart sounds.  Pulmonary:     Effort: Pulmonary effort is normal.     Breath sounds: Normal breath sounds. No wheezing, rhonchi or rales.  Skin:    General: Skin is warm and dry.     Findings: Abscess and erythema present. No ecchymosis.          Comments: Open wound apprx 2-3cm , with pink colored wound bed. Slight erythema  surrounding wound bed. No eschar.  No purulent discharge or increased warmth. One area proximal to open wound in which feels more taut, which is mildly tender with palpation.  No  streaking   Neurological:     Mental Status: She is alert.  Psychiatric:        Speech: Speech normal.        Behavior: Behavior normal.        Thought Content: Thought content normal.        Assessment & Plan:   1. Abscess Patient well-appearing, and overall very pleased with resolve of abscess.  Improvement expediated after started spontaneously draining.  One small area with tenderness, taut skin, we jointly agreed we could extend the Keflex to 1 more week.  Patient will stay vigilant and certainly let me know if she feels abscess does not continue to improve.   - cephALEXin (KEFLEX) 500 MG capsule; Take 1 capsule (500 mg total) by mouth every 6 (six) hours.  Dispense: 28 capsule; Refill: 0    I am having Lenard Simmer start on cephALEXin. I am also having her maintain her acetaminophen, ferrous sulfate, fluticasone, amLODipine, buPROPion, albuterol, ALPRAZolam, sertraline, clotrimazole, and gabapentin.   Meds ordered this encounter  Medications  . cephALEXin (KEFLEX) 500 MG capsule    Sig: Take 1 capsule (500 mg total) by mouth every 6 (six) hours.    Dispense:  28 capsule    Refill:  0    Order Specific Question:   Supervising Provider    Answer:   Crecencio Mc [[id number]]    Return precautions given.   Risks, benefits, and alternatives of the medications and treatment plan prescribed today were discussed, and patient expressed understanding.   Education regarding symptom management and diagnosis given to patient on AVS.  Continue to follow with Burnard Hawthorne, FNP for routine health maintenance.   Lenard Simmer and I agreed with plan.   [name], FNP

## 2018-09-05 ENCOUNTER — Other Ambulatory Visit: Payer: Self-pay | Admitting: Family

## 2018-09-05 DIAGNOSIS — F419 Anxiety disorder, unspecified: Principal | ICD-10-CM

## 2018-09-05 DIAGNOSIS — F329 Major depressive disorder, single episode, unspecified: Secondary | ICD-10-CM

## 2018-09-05 DIAGNOSIS — F32A Depression, unspecified: Secondary | ICD-10-CM

## 2018-09-13 ENCOUNTER — Other Ambulatory Visit: Payer: Self-pay | Admitting: Family

## 2018-09-13 DIAGNOSIS — F419 Anxiety disorder, unspecified: Principal | ICD-10-CM

## 2018-09-13 DIAGNOSIS — F32A Depression, unspecified: Secondary | ICD-10-CM

## 2018-09-13 DIAGNOSIS — F329 Major depressive disorder, single episode, unspecified: Secondary | ICD-10-CM

## 2018-09-14 NOTE — Telephone Encounter (Signed)
I looked up patient on [place] Controlled Substances Reporting System and saw no activity that raised concern of inappropriate use.   

## 2018-09-21 ENCOUNTER — Telehealth: Payer: Self-pay | Admitting: Family

## 2018-09-21 NOTE — Telephone Encounter (Signed)
Copied from [place] [id number]. Topic: Referral - Status >> Sep 21, 2018  3:06 PM Robina Ade, Helene Kelp D wrote: Reason for CRM: Pamala Hurry with scheduling called and would like to talk to [facility] with referrals regarding patients echo and a prior auth that is needed for it. She can be reached at [phone].

## 2018-09-22 ENCOUNTER — Other Ambulatory Visit: Payer: Self-pay | Admitting: Internal Medicine

## 2018-10-07 ENCOUNTER — Other Ambulatory Visit: Payer: Self-pay

## 2018-10-07 ENCOUNTER — Ambulatory Visit
Admission: RE | Admit: 2018-10-07 | Discharge: 2018-10-07 | Disposition: A | Discharge status: Home / Self Care | Payer: Self-pay | Source: Ambulatory Visit | Attending: Family | Admitting: Family

## 2018-10-07 DIAGNOSIS — I10 Essential (primary) hypertension: Secondary | ICD-10-CM | POA: Insufficient documentation

## 2018-10-07 DIAGNOSIS — R011 Cardiac murmur, unspecified: Secondary | ICD-10-CM | POA: Insufficient documentation

## 2018-10-07 NOTE — Progress Notes (Signed)
*  PRELIMINARY RESULTS* Echocardiogram 2D Echocardiogram has been performed.  Tammie Robinson 10/07/2018, 10:38 AM

## 2018-10-14 ENCOUNTER — Other Ambulatory Visit: Payer: Self-pay | Admitting: Family

## 2018-10-14 DIAGNOSIS — R011 Cardiac murmur, unspecified: Secondary | ICD-10-CM

## 2018-10-28 ENCOUNTER — Ambulatory Visit: Payer: Self-pay | Admitting: Family

## 2018-11-16 ENCOUNTER — Other Ambulatory Visit: Payer: Self-pay | Admitting: Family

## 2018-11-16 DIAGNOSIS — F419 Anxiety disorder, unspecified: Principal | ICD-10-CM

## 2018-11-16 DIAGNOSIS — F329 Major depressive disorder, single episode, unspecified: Secondary | ICD-10-CM

## 2018-11-16 NOTE — Telephone Encounter (Signed)
Call pt sch 3 month f/u since on controlled substance Refilled xanax

## 2018-11-16 NOTE — Telephone Encounter (Signed)
Last OV  08/28/2018  Last refilled 09/14/2018 disp 60 with 1 refill   Next OV none scheduled   Sent to PCP for approval

## 2018-11-16 NOTE — Telephone Encounter (Signed)
LMTCB to make 3 month f/u for Xanax refill.

## 2019-01-07 ENCOUNTER — Encounter: Payer: Self-pay | Admitting: Family

## 2019-01-08 ENCOUNTER — Other Ambulatory Visit: Payer: Self-pay | Admitting: Family

## 2019-01-08 DIAGNOSIS — F329 Major depressive disorder, single episode, unspecified: Secondary | ICD-10-CM

## 2019-01-08 DIAGNOSIS — I10 Essential (primary) hypertension: Secondary | ICD-10-CM

## 2019-01-08 DIAGNOSIS — F32A Depression, unspecified: Secondary | ICD-10-CM

## 2019-01-18 ENCOUNTER — Other Ambulatory Visit: Payer: Self-pay | Admitting: Family

## 2019-01-18 DIAGNOSIS — F329 Major depressive disorder, single episode, unspecified: Secondary | ICD-10-CM

## 2019-01-18 DIAGNOSIS — F419 Anxiety disorder, unspecified: Secondary | ICD-10-CM

## 2019-01-18 NOTE — Telephone Encounter (Signed)
Call pt I refilled her Xanax however she is due for an appointment since it is controlled substance, needs to be seen every 3 months.   Please schedule  I looked up patient on [place] Controlled Substances Reporting System and saw no activity that raised concern of inappropriate use.

## 2019-01-19 ENCOUNTER — Encounter: Payer: Self-pay | Admitting: Family

## 2019-01-20 ENCOUNTER — Encounter: Payer: Self-pay | Admitting: Family

## 2019-01-20 ENCOUNTER — Ambulatory Visit (INDEPENDENT_AMBULATORY_CARE_PROVIDER_SITE_OTHER): Payer: Self-pay | Admitting: Family

## 2019-01-20 ENCOUNTER — Telehealth: Payer: Self-pay

## 2019-01-20 DIAGNOSIS — F329 Major depressive disorder, single episode, unspecified: Secondary | ICD-10-CM

## 2019-01-20 DIAGNOSIS — I10 Essential (primary) hypertension: Secondary | ICD-10-CM

## 2019-01-20 DIAGNOSIS — F32A Depression, unspecified: Secondary | ICD-10-CM

## 2019-01-20 DIAGNOSIS — F419 Anxiety disorder, unspecified: Secondary | ICD-10-CM

## 2019-01-20 DIAGNOSIS — L659 Nonscarring hair loss, unspecified: Secondary | ICD-10-CM | POA: Insufficient documentation

## 2019-01-20 NOTE — Patient Instructions (Addendum)
Love the idea of exercise to help with sleep.   A walking program would be a great addition; take it slow and gradually increase the minutes each week.   Essentials for good sleep:   #1 Exercise #2 Limit Caffeine ( no caffeine after lunch) #3 No smart phones, TV prior to bed -- BLUE light is VERY activating and send the brain an 'awake message.'  #4 Go to bed at same time of night each night and get up at same time of day.  #5 Take 0.5 to 5mg  melatonin at 7pm with dinner -this is when natural melatonin will start to increase   Let me know how you are doing and stay safe   Insomnia Insomnia is a sleep disorder that makes it difficult to fall asleep or stay asleep. Insomnia can cause fatigue, low energy, difficulty concentrating, mood swings, and poor performance at work or school. There are three different ways to classify insomnia:  Difficulty falling asleep.  Difficulty staying asleep.  Waking up too early in the morning. Any type of insomnia can be long-term (chronic) or short-term (acute). Both are common. Short-term insomnia usually lasts for three months or less. Chronic insomnia occurs at least three times a week for longer than three months. What are the causes? Insomnia may be caused by another condition, situation, or substance, such as:  Anxiety.  Certain medicines.  Gastroesophageal reflux disease (GERD) or other gastrointestinal conditions.  Asthma or other breathing conditions.  Restless legs syndrome, sleep apnea, or other sleep disorders.  Chronic pain.  Menopause.  Stroke.  Abuse of alcohol, tobacco, or illegal drugs.  Mental health conditions, such as depression.  Caffeine.  Neurological disorders, such as Alzheimer's disease.  An overactive thyroid (hyperthyroidism). Sometimes, the cause of insomnia may not be known. What increases the risk? Risk factors for insomnia include:  Gender. Women are affected more often than men.  Age. Insomnia  is more common as you get older.  Stress.  Lack of exercise.  Irregular work schedule or working night shifts.  Traveling between different time zones.  Certain medical and mental health conditions. What are the signs or symptoms? If you have insomnia, the main symptom is having trouble falling asleep or having trouble staying asleep. This may lead to other symptoms, such as:  Feeling fatigued or having low energy.  Feeling nervous about going to sleep.  Not feeling rested in the morning.  Having trouble concentrating.  Feeling irritable, anxious, or depressed. How is this diagnosed? This condition may be diagnosed based on:  Your symptoms and medical history. Your health care provider may ask about: ? Your sleep habits. ? Any medical conditions you have. ? Your mental health.  A physical exam. How is this treated? Treatment for insomnia depends on the cause. Treatment may focus on treating an underlying condition that is causing insomnia. Treatment may also include:  Medicines to help you sleep.  Counseling or therapy.  Lifestyle adjustments to help you sleep better. Follow these instructions at home: Eating and drinking   Limit or avoid alcohol, caffeinated beverages, and cigarettes, especially close to bedtime. These can disrupt your sleep.  Do not eat a large meal or eat spicy foods right before bedtime. This can lead to digestive discomfort that can make it hard for you to sleep. Sleep habits   Keep a sleep diary to help you and your health care provider figure out what could be causing your insomnia. Write down: ? When you sleep. ? When  you wake up during the night. ? How well you sleep. ? How rested you feel the next day. ? Any side effects of medicines you are taking. ? What you eat and drink.  Make your bedroom a dark, comfortable place where it is easy to fall asleep. ? Put up shades or blackout curtains to block light from outside. ? Use a white  noise machine to block noise. ? Keep the temperature cool.  Limit screen use before bedtime. This includes: ? Watching TV. ? Using your smartphone, tablet, or computer.  Stick to a routine that includes going to bed and waking up at the same times every day and night. This can help you fall asleep faster. Consider making a quiet activity, such as reading, part of your nighttime routine.  Try to avoid taking naps during the day so that you sleep better at night.  Get out of bed if you are still awake after 15 minutes of trying to sleep. Keep the lights down, but try reading or doing a quiet activity. When you feel sleepy, go back to bed. General instructions  Take over-the-counter and prescription medicines only as told by your health care provider.  Exercise regularly, as told by your health care provider. Avoid exercise starting several hours before bedtime.  Use relaxation techniques to manage stress. Ask your health care provider to suggest some techniques that may work well for you. These may include: ? Breathing exercises. ? Routines to release muscle tension. ? Visualizing peaceful scenes.  Make sure that you drive carefully. Avoid driving if you feel very sleepy.  Keep all follow-up visits as told by your health care provider. This is important. Contact a health care provider if:  You are tired throughout the day.  You have trouble in your daily routine due to sleepiness.  You continue to have sleep problems, or your sleep problems get worse. Get help right away if:  You have serious thoughts about hurting yourself or someone else. If you ever feel like you may hurt yourself or others, or have thoughts about taking your own life, get help right away. You can go to your nearest emergency department or call:  Your local emergency services (911 in the [place]  A suicide crisis helpline, such as the [place] at 6180146268. This is open 24  hours a day. Summary  Insomnia is a sleep disorder that makes it difficult to fall asleep or stay asleep.  Insomnia can be long-term (chronic) or short-term (acute).  Treatment for insomnia depends on the cause. Treatment may focus on treating an underlying condition that is causing insomnia.  Keep a sleep diary to help you and your health care provider figure out what could be causing your insomnia. This information is not intended to replace advice given to you by your health care provider. Make sure you discuss any questions you have with your health care provider. Document Released: 07/05/2000 Document Revised: 06/20/2017 Document Reviewed: 04/17/2017 Elsevier Patient Education  2020 [employer].

## 2019-01-20 NOTE — Assessment & Plan Note (Addendum)
New. Not particularly bothersome. Declines lab evaluation, dermatology consult.

## 2019-01-20 NOTE — Telephone Encounter (Signed)
I called to let patient know that I was sending doxy invite early. I informed that I would try her back this afternoon to try to go over med list.

## 2019-01-20 NOTE — Telephone Encounter (Signed)
Patient scheduled for today 01/20/19.

## 2019-01-20 NOTE — Assessment & Plan Note (Addendum)
Some worse of late. She declines changing medication regimen. Discussed increasing zoloft to 150mg , decreasing wellbutrin ( or changing to IR versus XL). We also discussed ( with some pause) ambien again however expressed concern with sedating agents ( zoloft, xanax). Discussed sleep hygiene at length. She prefers to trial exercise and let me know if helps with sleep, anxiety. Close follow up.

## 2019-01-20 NOTE — Progress Notes (Signed)
This visit type was conducted due to national recommendations for restrictions regarding the COVID-19 pandemic (e.g. social distancing).  This format is felt to be most appropriate for this patient at this time.  All issues noted in this document were discussed and addressed.  No physical exam was performed (except for noted visual exam findings with Video Visits). Virtual Visit via Video Note  I connected with@  on 01/20/19 at  4:30 PM EDT by a video enabled telemedicine application and verified that I am speaking with the correct person using two identifiers.  Location patient: home Location provider:home office Persons participating in the virtual visit: patient, provider  I discussed the limitations of evaluation and management by telemedicine and the availability of in person appointments. The patient expressed understanding and agreed to proceed.   HPI:   Feels like hair is thinning. No patches of loss hair. Notices it in shower.  Mother wears a whig  HTN- 119/73. Denies exertional chest pain or pressure, numbness or tingling radiating to left arm or jaw, palpitations, dizziness, frequent headaches, changes in vision, or shortness of breath.    Anxiety and depression- some worsening of late during COVID. Feels needs to stop watching so much news.  depression, eating more and has gained weight.  wellbutrin, xanax daily and zoloft.   Stil has trouble falling asleep. Has been on [place]. Trazodone didn't work well. Going to bed at 11pm most night.   No formal exercise. Thinks that counseling would be helpful.  No alcohol use.   Didn't have strange dreams, sleep walking on ambien.    ROS: See pertinent positives and negatives per HPI.  Past Medical History:  Diagnosis Date  . Anemia    prior to CA dx  . Asthma    seasonal,never hospitalized  . Blood in stool   . Colostomy in place [facility])   . History of ERCP   . Hypertension   . Migraine   . 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 PTSD (post-traumatic stress disorder)   . Rectal cancer ([place])   . Wears contact lenses     Past Surgical History:  Procedure Laterality Date  . ABDOMINAL HYSTERECTOMY  04/12/2014   [facility] with CA surgery; no cervix ( M.Zayaan Kozak 01/2017)  . CHOLECYSTECTOMY  2012   Dr. Jamal Collin  . COLONOSCOPY WITH PROPOFOL N/A 01/26/2016   Procedure: COLONOSCOPY WITH PROPOFOL through colostomy;  Surgeon: Lucilla Lame, MD;  Location: [place];  Service: Endoscopy;  Laterality: N/A;  port-a-cath  . POLYPECTOMY  01/26/2016   Procedure: POLYPECTOMY;  Surgeon: Lucilla Lame, MD;  Location: [place];  Service: Endoscopy;;  . PORTA CATH REMOVAL N/A 01/28/2018   Procedure: PORTA CATH REMOVAL;  Surgeon: Algernon Huxley, MD;  Location: [place] CV LAB;  Service: Cardiovascular;  Laterality: N/A;  . PORTACATH PLACEMENT    . PROCTECTOMY  04/12/14   [facility] - CA surgery - with colostomy  . SALPINGOOPHORECTOMY Bilateral 04/12/14   [facility] - with CA surg  . VAGINA RECONSTRUCTION SURGERY  04/12/14   [facility] - transabdonimal rectus abdominus muscle reconstruction  . VAGINAL DELIVERY     2, preterm labor    Family History  Problem Relation Age of Onset  . Arthritis Mother   . Stroke Mother   . Hypertension Mother   . Diabetes Mother   . Arthritis Father   . Heart disease Father 71       s/p CABG  . Stroke Father   .  Hypertension Father   . Alcohol abuse Maternal Grandmother   . Diabetes Maternal Grandmother   . Alcohol abuse Maternal Grandfather   . Diabetes Maternal Grandfather   . Cancer Paternal Grandmother        breast  . Heart disease Paternal Grandfather   . Breast cancer Paternal Aunt 85  . Colon cancer Neg Hx     SOCIAL HX: never smoker   Current Outpatient Medications:  .  acetaminophen (TYLENOL) 325 MG tablet, Take 325 mg by mouth every 6 (six) hours as needed for mild pain. , Disp: , Rfl:  .  albuterol (PROVENTIL HFA;VENTOLIN HFA) 108 (90 Base) MCG/ACT inhaler, Inhale 1-2  puffs into the lungs every 4 (four) hours as needed for wheezing or shortness of breath., Disp: 1 Inhaler, Rfl: 3 .  ALPRAZolam (XANAX) 0.25 MG tablet, TAKE ONE TABLET BY MOUTH TWICE DAILY AS NEEDED FOR ANXIETY, Disp: 60 tablet, Rfl: 1 .  amLODipine (NORVASC) 10 MG tablet, TAKE ONE TABLET BY MOUTH ONCE DAILY, Disp: 90 tablet, Rfl: 4 .  buPROPion (WELLBUTRIN XL) 150 MG 24 hr tablet, TAKE TWO TABLETS BY MOUTH EVERY MORNING, Disp: 60 tablet, Rfl: 3 .  clotrimazole (LOTRIMIN) 1 % cream, Apply 1 application topically 2 (two) times daily., Disp: 30 g, Rfl: 1 .  ferrous sulfate 325 (65 FE) MG tablet, Take by mouth 2 (two) times daily with a meal. , Disp: , Rfl:  .  fluticasone (FLONASE) 50 MCG/ACT nasal spray, Place 2 sprays into both nostrils daily., Disp: 16 g, Rfl: 1 .  gabapentin (NEURONTIN) 300 MG capsule, TAKE ONE CAPSULE BY MOUTH THREE TIMES DAILY & ONE CAPSULE AT BEDTIME, Disp: 120 capsule, Rfl: 5 .  sertraline (ZOLOFT) 100 MG tablet, Take 1 tablet (100 mg total) by mouth at bedtime., Disp: 90 tablet, Rfl: 1 .  cephALEXin (KEFLEX) 500 MG capsule, Take 1 capsule (500 mg total) by mouth every 6 (six) hours., Disp: 28 capsule, Rfl: 0 No current facility-administered medications for this visit.   Facility-Administered Medications Ordered in Other Visits:  .  heparin lock flush 100 unit/mL, 500 Units, Intravenous, Once, Pandit, Sandeep, MD .  sodium chloride 0.9 % injection 10 mL, 10 mL, Intravenous, PRN, Ma Hillock, Sandeep, MD .  sodium chloride 0.9 % injection 10 mL, 10 mL, Intravenous, PRN, Ma Hillock, Sandeep, MD, 10 mL at 11/28/14 1120 .  sodium chloride flush (NS) 0.9 % injection 10 mL, 10 mL, Intravenous, PRN, Herring, Orville Govern, NP, 10 mL at 01/03/16 1413 .  sodium chloride flush (NS) 0.9 % injection 10 mL, 10 mL, Intravenous, PRN, Lloyd Huger, MD, 10 mL at 11/15/16 1111 .  sodium chloride flush (NS) 0.9 % injection 10 mL, 10 mL, Intravenous, PRN, Cammie Sickle, MD, 10 mL at 11/24/17  1434  EXAM:  VITALS per patient if applicable:  GENERAL: alert, oriented, appears well and in no acute distress  HEENT: atraumatic, conjunttiva clear, no obvious abnormalities on inspection of external nose and ears  NECK: normal movements of the head and neck  LUNGS: on inspection no signs of respiratory distress, breathing rate appears normal, no obvious gross SOB, gasping or wheezing  CV: no obvious cyanosis  MS: moves all visible extremities without noticeable abnormality  PSYCH/NEURO: pleasant and cooperative, no obvious depression or anxiety, speech and thought processing grossly intact  ASSESSMENT AND PLAN:  Discussed the following assessment and plan:  Problem List Items Addressed This Visit      Cardiovascular and Mediastinum   Hypertension  At goal; continue regimen.         Other   Anxiety and depression    Some worse of late. She declines changing medication regimen. Discussed increasing zoloft to 150mg , decreasing wellbutrin ( or changing to IR versus XL). We also discussed ( with some pause) ambien again however expressed concern with sedating agents ( zoloft, xanax). Discussed sleep hygiene at length. She prefers to trial exercise and let me know if helps with sleep, anxiety. Close follow up.       Hair loss    New. Not particularly bothersome. Declines lab evaluation, dermatology consult.             I discussed the assessment and treatment plan with the patient. The patient was provided an opportunity to ask questions and all were answered. The patient agreed with the plan and demonstrated an understanding of the instructions.   The patient was advised to call back or seek an in-person evaluation if the symptoms worsen or if the condition fails to improve as anticipated.   [name], FNP

## 2019-01-20 NOTE — Assessment & Plan Note (Signed)
At goal; continue regimen.

## 2019-01-21 ENCOUNTER — Telehealth: Payer: Self-pay

## 2019-01-21 NOTE — Telephone Encounter (Signed)
I called to Dr. Darlin Priestly office & was unable to reach someone to add on TSH lab. I asked for someone to please call our office back.

## 2019-01-21 NOTE — Telephone Encounter (Signed)
I returned call to [place], but left another message. I asked that they add TSH lab for patient when she has labs with Dr. Rogue Bussing.

## 2019-01-21 NOTE — Telephone Encounter (Signed)
Copied from [place] [phone]. Topic: Quick Communication - See Telephone Encounter >> Jan 21, 2019  3:04 PM [place] wrote: CRM for notification. See Telephone encounter for: 01/21/19. Hassan Rowan at the [facility] is returning [place] call and she will not be available until Monday unless you message her today

## 2019-01-21 NOTE — Telephone Encounter (Signed)
Error

## 2019-01-21 NOTE — Telephone Encounter (Signed)
See previous note. I have left another message for Hassan Rowan at the the [facility].

## 2019-01-25 ENCOUNTER — Telehealth: Payer: Self-pay | Admitting: *Deleted

## 2019-01-25 DIAGNOSIS — E0590 Thyrotoxicosis, unspecified without thyrotoxic crisis or storm: Secondary | ICD-10-CM

## 2019-01-25 NOTE — Telephone Encounter (Signed)
Spoke with Dr. Rogue Bussing - Md approved tsh. Orders entered

## 2019-01-25 NOTE — Telephone Encounter (Signed)
Received call msg -transferred from triage.  Patient's pcp- Arnett would like to order a tsh for patient. Would Dr. B would be willing to order the tsh on Wednesday?

## 2019-01-27 ENCOUNTER — Inpatient Hospital Stay (HOSPITAL_BASED_OUTPATIENT_CLINIC_OR_DEPARTMENT_OTHER): Payer: Self-pay | Admitting: Internal Medicine

## 2019-01-27 ENCOUNTER — Other Ambulatory Visit: Payer: Self-pay

## 2019-01-27 ENCOUNTER — Inpatient Hospital Stay: Payer: Self-pay | Attending: Internal Medicine

## 2019-01-27 DIAGNOSIS — Z79899 Other long term (current) drug therapy: Secondary | ICD-10-CM

## 2019-01-27 DIAGNOSIS — Z933 Colostomy status: Secondary | ICD-10-CM | POA: Insufficient documentation

## 2019-01-27 DIAGNOSIS — J45909 Unspecified asthma, uncomplicated: Secondary | ICD-10-CM | POA: Insufficient documentation

## 2019-01-27 DIAGNOSIS — C20 Malignant neoplasm of rectum: Secondary | ICD-10-CM

## 2019-01-27 DIAGNOSIS — I10 Essential (primary) hypertension: Secondary | ICD-10-CM | POA: Insufficient documentation

## 2019-01-27 DIAGNOSIS — E0590 Thyrotoxicosis, unspecified without thyrotoxic crisis or storm: Secondary | ICD-10-CM

## 2019-01-27 DIAGNOSIS — R948 Abnormal results of function studies of other organs and systems: Secondary | ICD-10-CM

## 2019-01-27 DIAGNOSIS — Z803 Family history of malignant neoplasm of breast: Secondary | ICD-10-CM | POA: Insufficient documentation

## 2019-01-27 DIAGNOSIS — F4310 Post-traumatic stress disorder, unspecified: Secondary | ICD-10-CM | POA: Insufficient documentation

## 2019-01-27 DIAGNOSIS — D649 Anemia, unspecified: Secondary | ICD-10-CM

## 2019-01-27 DIAGNOSIS — G629 Polyneuropathy, unspecified: Secondary | ICD-10-CM

## 2019-01-27 DIAGNOSIS — R944 Abnormal results of kidney function studies: Secondary | ICD-10-CM | POA: Insufficient documentation

## 2019-01-27 LAB — COMPREHENSIVE METABOLIC PANEL
ALT: 45 U/L — ABNORMAL HIGH (ref 0–44)
AST: 37 U/L (ref 15–41)
Albumin: 4.3 g/dL (ref 3.5–5.0)
Alkaline Phosphatase: 94 U/L (ref 38–126)
Anion gap: 7 (ref 5–15)
BUN: 21 mg/dL — ABNORMAL HIGH (ref 6–20)
CO2: 27 mmol/L (ref 22–32)
Calcium: 8.7 mg/dL — ABNORMAL LOW (ref 8.9–10.3)
Chloride: 106 mmol/L (ref 98–111)
Creatinine, Ser: 1.13 mg/dL — ABNORMAL HIGH (ref 0.44–1.00)
GFR calc Af Amer: >60 mL/min (ref >60)
GFR calc non Af Amer: 55 mL/min — ABNORMAL LOW (ref >60)
Glucose, Bld: 100 mg/dL — ABNORMAL HIGH (ref 70–99)
Potassium: 4 mmol/L (ref 3.5–5.1)
Sodium: 140 mmol/L (ref 135–145)
Total Bilirubin: 0.6 mg/dL (ref 0.3–1.2)
Total Protein: 7.4 g/dL (ref 6.5–8.1)

## 2019-01-27 LAB — CBC WITH DIFFERENTIAL/PLATELET
Abs Immature Granulocytes: 0.04 10*3/uL (ref 0.00–0.07)
Basophils Absolute: 0.1 10*3/uL (ref 0.0–0.1)
Basophils Relative: 1 %
Eosinophils Absolute: 0.3 10*3/uL (ref 0.0–0.5)
Eosinophils Relative: 4 %
HCT: 40.5 % (ref 36.0–46.0)
Hemoglobin: 13.3 g/dL (ref 12.0–15.0)
Immature Granulocytes: 1 %
Lymphocytes Relative: 24 %
Lymphs Abs: 1.7 10*3/uL (ref 0.7–4.0)
MCH: 27.5 pg (ref 26.0–34.0)
MCHC: 32.8 g/dL (ref 30.0–36.0)
MCV: 83.7 fL (ref 80.0–100.0)
Monocytes Absolute: 0.6 10*3/uL (ref 0.1–1.0)
Monocytes Relative: 9 %
Neutro Abs: 4.3 10*3/uL (ref 1.7–7.7)
Neutrophils Relative %: 61 %
Platelets: 205 10*3/uL (ref 150–400)
RBC: 4.84 MIL/uL (ref 3.87–5.11)
RDW: 13.9 % (ref 11.5–15.5)
WBC: 7 10*3/uL (ref 4.0–10.5)
nRBC: 0 % (ref 0.0–0.2)

## 2019-01-27 LAB — TSH: TSH: 3.199 u[IU]/mL (ref 0.350–4.500)

## 2019-01-27 NOTE — Progress Notes (Signed)
Patient does not offer any problems today.  She has recently been diagnosed with heart murmur and will be followed by cardiology.

## 2019-01-27 NOTE — Assessment & Plan Note (Addendum)
#   Rectal cancer, clinical TXN2M0, at least stage III [2015].  Clinically no evidence of recurrence-see discussion on liver lesions [below].  #Recommend follow-up with Dr. Allen Henrickson GI regarding surveillance colonoscopy-due again in summer 2020.  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  Stable  #Mild AST ALT elevation-likely fatty liver; stable recommend weight loss.   # slightly elevated creatnine-recommend better control blood pressure/weight loss.  DISPOSITION:  #Follow-up in 1 year- MD; labs- CBC CMP CEA.- Dr.B

## 2019-01-27 NOTE — Progress Notes (Signed)
[place] OFFICE PROGRESS NOTE  Patient Care Team: Burnard Hawthorne, FNP as PCP - General (Family Medicine) Rico Junker, RN as Registered Nurse Theodore Demark, RN as Registered Nurse  Cancer Staging Rectal cancer [facility]) Staging form: Colon and Rectum, AJCC 7th Edition - Clinical stage from 11/21/2014: Stage Unknown ([place], N2, M0) - Signed by Leia Alf, MD on 11/21/2014    Oncology History Overview Note  # 2015- RECTAL CA STAGE III-  s/p neoadjuvant radiation and concurrent 5FU chemotherapy completed end of July/early Aug 2015.  (initially diagnosed on 12/22/13 - upper GI showed an ulcer and colonoscopy showed a large rectal mass at the most proximal extent 4 cm from the anus). Then had Abdominoperineal resection, radical hysterectomy, vaginectomy with removal of tubes/ovaries with rectus flap myocutaneous vaginal reconstruction at [facility] on 04/12/14. Surgical pathology reports residual invasive rectal adenocarcinoma low-grade, tumor invades muscularis propria, margins uninvolved, no lymphovascular or perineural invasion, 27 lymph nodes examined negative for metastasis (ypT2pN0). S/p FOLFOX adjuvant chemo  # # Complex liver lesion-PET negative in 2015.  Previously reviewed the tumor conference; again MRI of the liver today shows complex cyst; adjacent stable lesion-suspect benign etiology.  No further work-up or imaging is recommended at this time.  # Peripheral Neuropathy- G-2.   # June 2017- colo Dr.Wohl;   DIAGNOSIS: Colon cancer  STAGE:   III      ;GOALS: cure  CURRENT/MOST RECENT THERAPY : surveillaince     Rectal cancer ([place])  01/03/2014 Initial Diagnosis   Rectal cancer ([facility])       INTERVAL HISTORY:  Tammie Robinson 53 y.o.  female pleasant patient above history of stage III colon cancer is here for follow-up.  Patient continues to have chronic mild tingling and numbness in the feet.  This is neither getting any better or  worse.  Denies any abdominal pain nausea vomiting or weight loss.  No blood in stools or black or stools.  Review of Systems  Constitutional: Negative for chills, diaphoresis, fever, malaise/fatigue and weight loss.  HENT: Negative for nosebleeds and sore throat.   Eyes: Negative for double vision.  Respiratory: Negative for cough, hemoptysis, sputum production, shortness of breath and wheezing.   Cardiovascular: Negative for chest pain, palpitations, orthopnea and leg swelling.  Gastrointestinal: Negative for abdominal pain, blood in stool, constipation, diarrhea, heartburn, melena, nausea and vomiting.  Genitourinary: Negative for dysuria, frequency and urgency.  Musculoskeletal: Negative for back pain and joint pain.  Skin: Negative.  Negative for itching and rash.  Neurological: Positive for tingling. Negative for dizziness, focal weakness, weakness and headaches.  Endo/Heme/Allergies: Does not bruise/bleed easily.  Psychiatric/Behavioral: Negative for depression. The patient is not nervous/anxious and does not have insomnia.       PAST MEDICAL HISTORY :  Past Medical History:  Diagnosis Date  . Anemia    prior to CA dx  . Asthma    seasonal,never hospitalized  . Blood in stool   . Colostomy in place [facility])   . History of ERCP   . Hypertension   . Migraine   . 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 PTSD (post-traumatic stress disorder)   . Rectal cancer ([place])   . Wears contact lenses     PAST SURGICAL HISTORY :   Past Surgical History:  Procedure Laterality Date  . ABDOMINAL HYSTERECTOMY  04/12/2014   [facility] with CA surgery; no cervix ( M.Arnett 01/2017)  . CHOLECYSTECTOMY  2012   Dr. Jamal Collin  . COLONOSCOPY WITH PROPOFOL N/A 01/26/2016   Procedure: COLONOSCOPY WITH PROPOFOL through colostomy;  Surgeon: Lucilla Lame, MD;  Location: [place];  Service: Endoscopy;  Laterality: N/A;  port-a-cath  . POLYPECTOMY  01/26/2016   Procedure: POLYPECTOMY;  Surgeon:  Lucilla Lame, MD;  Location: [place];  Service: Endoscopy;;  . PORTA CATH REMOVAL N/A 01/28/2018   Procedure: PORTA CATH REMOVAL;  Surgeon: Algernon Huxley, MD;  Location: [place] CV LAB;  Service: Cardiovascular;  Laterality: N/A;  . PORTACATH PLACEMENT    . PROCTECTOMY  04/12/14   [facility] - CA surgery - with colostomy  . SALPINGOOPHORECTOMY Bilateral 04/12/14   [facility] - with CA surg  . VAGINA RECONSTRUCTION SURGERY  04/12/14   [facility] - transabdonimal rectus abdominus muscle reconstruction  . VAGINAL DELIVERY     2, preterm labor    FAMILY HISTORY :   Family History  Problem Relation Age of Onset  . Arthritis Mother   . Stroke Mother   . Hypertension Mother   . Diabetes Mother   . Arthritis Father   . Heart disease Father 80       s/p CABG  . Stroke Father   . Hypertension Father   . Alcohol abuse Maternal Grandmother   . Diabetes Maternal Grandmother   . Alcohol abuse Maternal Grandfather   . Diabetes Maternal Grandfather   . Cancer Paternal Grandmother        breast  . Heart disease Paternal Grandfather   . Breast cancer Paternal Aunt 14  . Colon cancer Neg Hx     SOCIAL HISTORY:   Social History   Tobacco Use  . Smoking status: Never Smoker  . Smokeless tobacco: Never Used  Substance Use Topics  . Alcohol use: No    Comment: rare - holidays  . Drug use: No    ALLERGIES:  is allergic to lisinopril.  MEDICATIONS:  Current Outpatient Medications  Medication Sig Dispense Refill  . acetaminophen (TYLENOL) 325 MG tablet Take 325 mg by mouth every 6 (six) hours as needed for mild pain.     Marland Kitchen albuterol (PROVENTIL HFA;VENTOLIN HFA) 108 (90 Base) MCG/ACT inhaler Inhale 1-2 puffs into the lungs every 4 (four) hours as needed for wheezing or shortness of breath. 1 Inhaler 3  . ALPRAZolam (XANAX) 0.25 MG tablet TAKE ONE TABLET BY MOUTH TWICE DAILY AS NEEDED FOR ANXIETY 60 tablet 1  . amLODipine (NORVASC) 10 MG tablet TAKE ONE TABLET BY MOUTH ONCE DAILY 90 tablet 4   . buPROPion (WELLBUTRIN XL) 150 MG 24 hr tablet TAKE TWO TABLETS BY MOUTH EVERY MORNING 60 tablet 3  . clotrimazole (LOTRIMIN) 1 % cream Apply 1 application topically 2 (two) times daily. 30 g 1  . ferrous sulfate 325 (65 FE) MG tablet Take by mouth 2 (two) times daily with a meal.     . fluticasone (FLONASE) 50 MCG/ACT nasal spray Place 2 sprays into both nostrils daily. 16 g 1  . gabapentin (NEURONTIN) 300 MG capsule TAKE ONE CAPSULE BY MOUTH THREE TIMES DAILY & ONE CAPSULE AT BEDTIME 120 capsule 5  . sertraline (ZOLOFT) 100 MG tablet Take 1 tablet (100 mg total) by mouth at bedtime. 90 tablet 1  . cephALEXin (KEFLEX) 500 MG capsule Take 1 capsule (500 mg total) by mouth every 6 (six) hours. 28 capsule 0   No current facility-administered medications for this visit.    Facility-Administered Medications Ordered in Other Visits  Medication  Dose Route Frequency Provider Last Rate Last Dose  . heparin lock flush 100 unit/mL  500 Units Intravenous Once Leia Alf, MD      . sodium chloride 0.9 % injection 10 mL  10 mL Intravenous PRN Ma Hillock, Sandeep, MD      . sodium chloride 0.9 % injection 10 mL  10 mL Intravenous PRN Leia Alf, MD   10 mL at 11/28/14 1120  . sodium chloride flush (NS) 0.9 % injection 10 mL  10 mL Intravenous PRN Evlyn Kanner, NP   10 mL at 01/03/16 1413  . sodium chloride flush (NS) 0.9 % injection 10 mL  10 mL Intravenous PRN Lloyd Huger, MD   10 mL at 11/15/16 1111  . sodium chloride flush (NS) 0.9 % injection 10 mL  10 mL Intravenous PRN Cammie Sickle, MD   10 mL at 11/24/17 1434    PHYSICAL EXAMINATION: ECOG PERFORMANCE STATUS: 1 - Symptomatic but completely ambulatory  BP (!) 152/86   Pulse 76   Temp (!) 95.7 F (35.4 C)   Resp 18   Wt 245 lb 12.8 oz (111.5 kg)   LMP 12/11/2013   BMI 40.90 kg/m   Filed Weights   01/27/19 1356  Weight: 245 lb 12.8 oz (111.5 kg)    Physical Exam  Constitutional: She is oriented to person,  place, and time and well-developed, well-nourished, and in no distress.  Obese.  She is alone.  HENT:  Head: Normocephalic and atraumatic.  Mouth/Throat: Oropharynx is clear and moist. No oropharyngeal exudate.  Eyes: Pupils are equal, round, and reactive to light.  Neck: Normal range of motion. Neck supple.  Cardiovascular: Normal rate and regular rhythm.  Pulmonary/Chest: Effort normal and breath sounds normal. No respiratory distress. She has no wheezes.  Abdominal: Soft. Bowel sounds are normal. She exhibits no distension and no mass. There is no abdominal tenderness. There is no rebound and no guarding.  Colostomy pouch in place.  Musculoskeletal: Normal range of motion.        General: No tenderness or edema.  Neurological: She is alert and oriented to person, place, and time.  Skin: Skin is warm.  Psychiatric: Affect normal.       LABORATORY DATA:  I have reviewed the data as listed    Component Value Date/Time   NA 140 01/27/2019 1313   NA 144 08/29/2014 0908   K 4.0 01/27/2019 1313   K 3.3 (L) 08/29/2014 0908   CL 106 01/27/2019 1313   CL 107 08/29/2014 0908   CO2 27 01/27/2019 1313   CO2 29 08/29/2014 0908   GLUCOSE 100 (H) 01/27/2019 1313   GLUCOSE 88 08/29/2014 0908   BUN 21 (H) 01/27/2019 1313   BUN 18 08/29/2014 0908   CREATININE 1.13 (H) 01/27/2019 1313   CREATININE 0.52 11/07/2014 0902   CALCIUM 8.7 (L) 01/27/2019 1313   CALCIUM 8.4 (L) 08/29/2014 0908   PROT 7.4 01/27/2019 1313   PROT 6.8 11/07/2014 0902   ALBUMIN 4.3 01/27/2019 1313   ALBUMIN 3.5 11/07/2014 0902   AST 37 01/27/2019 1313   AST 41 11/07/2014 0902   ALT 45 (H) 01/27/2019 1313   ALT 31 11/07/2014 0902   ALKPHOS 94 01/27/2019 1313   ALKPHOS 137 (H) 11/07/2014 0902   BILITOT 0.6 01/27/2019 1313   BILITOT 0.8 11/07/2014 0902   GFRNONAA 55 (L) 01/27/2019 1313   GFRNONAA >60 11/07/2014 0902   GFRAA >60 01/27/2019 1313   GFRAA >60 11/07/2014 0902  No results found for: SPEP,  UPEP  Lab Results  Component Value Date   WBC 7.0 01/27/2019   NEUTROABS 4.3 01/27/2019   HGB 13.3 01/27/2019   HCT 40.5 01/27/2019   MCV 83.7 01/27/2019   PLT 205 01/27/2019      Chemistry      Component Value Date/Time   NA 140 01/27/2019 1313   NA 144 08/29/2014 0908   K 4.0 01/27/2019 1313   K 3.3 (L) 08/29/2014 0908   CL 106 01/27/2019 1313   CL 107 08/29/2014 0908   CO2 27 01/27/2019 1313   CO2 29 08/29/2014 0908   BUN 21 (H) 01/27/2019 1313   BUN 18 08/29/2014 0908   CREATININE 1.13 (H) 01/27/2019 1313   CREATININE 0.52 11/07/2014 0902      Component Value Date/Time   CALCIUM 8.7 (L) 01/27/2019 1313   CALCIUM 8.4 (L) 08/29/2014 0908   ALKPHOS 94 01/27/2019 1313   ALKPHOS 137 (H) 11/07/2014 0902   AST 37 01/27/2019 1313   AST 41 11/07/2014 0902   ALT 45 (H) 01/27/2019 1313   ALT 31 11/07/2014 0902   BILITOT 0.6 01/27/2019 1313   BILITOT 0.8 11/07/2014 0902       RADIOGRAPHIC STUDIES: I have personally reviewed the radiological images as listed and agreed with the findings in the report. No results found.   ASSESSMENT & PLAN:  Rectal cancer ([place]) # Rectal cancer, clinical TXN2M0, at least stage III [2015].  Clinically no evidence of recurrence-see discussion on liver lesions [below].  #Recommend follow-up with Dr. Allen Brune GI regarding surveillance colonoscopy-due again in summer 2020.  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  Stable  #Mild AST ALT elevation-likely fatty liver; stable recommend weight loss.   # slightly elevated creatnine-recommend better control blood pressure/weight loss.  DISPOSITION:  #Follow-up in 1 year- MD; labs- CBC CMP CEA.- Dr.B   Orders Placed This Encounter  Procedures  . CBC with Differential    Standing Status:   Future    Standing Expiration Date:   07/29/2020  . Comprehensive metabolic panel    Standing Status:   Future    Standing Expiration Date:   07/29/2020  . CEA     Standing Status:   Future    Standing Expiration Date:   07/29/2020   All questions were answered. The patient knows to call the clinic with any problems, questions or concerns.      Cammie Sickle, MD 01/27/2019 9:11 PM

## 2019-01-28 LAB — CEA: CEA: 3.6 ng/mL (ref 0.0–4.7)

## 2019-02-03 ENCOUNTER — Encounter: Payer: Self-pay | Admitting: Internal Medicine

## 2019-02-19 ENCOUNTER — Other Ambulatory Visit: Payer: Self-pay | Admitting: Family

## 2019-02-19 ENCOUNTER — Other Ambulatory Visit: Payer: Self-pay | Admitting: Internal Medicine

## 2019-02-19 DIAGNOSIS — F419 Anxiety disorder, unspecified: Secondary | ICD-10-CM

## 2019-02-19 DIAGNOSIS — C20 Malignant neoplasm of rectum: Secondary | ICD-10-CM

## 2019-02-19 DIAGNOSIS — F329 Major depressive disorder, single episode, unspecified: Secondary | ICD-10-CM

## 2019-03-21 ENCOUNTER — Other Ambulatory Visit: Payer: Self-pay | Admitting: Family

## 2019-03-21 DIAGNOSIS — F419 Anxiety disorder, unspecified: Secondary | ICD-10-CM

## 2019-03-21 DIAGNOSIS — F329 Major depressive disorder, single episode, unspecified: Secondary | ICD-10-CM

## 2019-03-24 ENCOUNTER — Other Ambulatory Visit: Payer: Self-pay

## 2019-03-24 DIAGNOSIS — F32A Depression, unspecified: Secondary | ICD-10-CM

## 2019-03-24 DIAGNOSIS — F329 Major depressive disorder, single episode, unspecified: Secondary | ICD-10-CM

## 2019-03-24 MED ORDER — ALPRAZOLAM 0.25 MG PO TABS: 0.2500 mg | ORAL_TABLET | Freq: Two times a day (BID) | ORAL | 1 refills | Status: DC | PRN

## 2019-03-24 NOTE — Telephone Encounter (Signed)
I looked up patient on  Controlled Substances Reporting System and saw no activity that raised concern of inappropriate use.   

## 2019-05-10 ENCOUNTER — Other Ambulatory Visit: Payer: Self-pay | Admitting: Family

## 2019-05-10 DIAGNOSIS — F329 Major depressive disorder, single episode, unspecified: Secondary | ICD-10-CM

## 2019-05-10 DIAGNOSIS — F419 Anxiety disorder, unspecified: Secondary | ICD-10-CM

## 2019-05-20 ENCOUNTER — Other Ambulatory Visit: Payer: Self-pay | Admitting: Family

## 2019-05-20 DIAGNOSIS — F419 Anxiety disorder, unspecified: Secondary | ICD-10-CM

## 2019-05-20 DIAGNOSIS — F329 Major depressive disorder, single episode, unspecified: Secondary | ICD-10-CM

## 2019-05-20 NOTE — Telephone Encounter (Signed)
Last Office visit 7/20 last refill 9/20 OK to fill?

## 2019-05-21 NOTE — Telephone Encounter (Signed)
rx ok'd for xanax #60 with no refills.  (check PMP Aware).

## 2019-06-22 ENCOUNTER — Other Ambulatory Visit: Payer: Self-pay | Admitting: Internal Medicine

## 2019-06-22 DIAGNOSIS — F329 Major depressive disorder, single episode, unspecified: Secondary | ICD-10-CM

## 2019-06-22 DIAGNOSIS — F419 Anxiety disorder, unspecified: Secondary | ICD-10-CM

## 2019-07-04 IMAGING — MR MR ABDOMEN WO/W CM
16 of 17 series · 44 of 48 positions shown · IV contrast (multihance)
Comparison: 07/18/2017

CLINICAL DATA: Rectal cancer.  Followup liver lesion.

EXAM:
MRI ABDOMEN WITHOUT AND WITH CONTRAST
TECHNIQUE: Multiplanar multisequence MR imaging of the abdomen was performed
both before and after the administration of intravenous contrast.
CONTRAST:  20mL MULTIHANCE GADOBENATE DIMEGLUMINE 529 MG/ML IV SOLN

[Series 2: cor ssfse / · coronal · 7.0mm · 1.48mm/px · 2 of 32 slices shown]
[im 1/32]
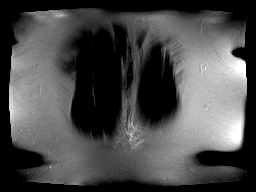
[im 32/32]
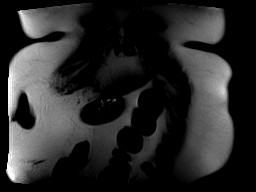

[Series 3: T2 fat-sat · axial · 6.0mm · 1.48mm/px · z∈[-124,+121]mm · 2 of 35 slices shown]
[im 1/35]
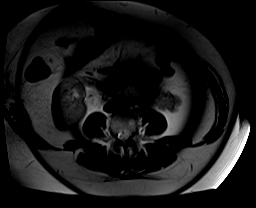
[im 35/35]
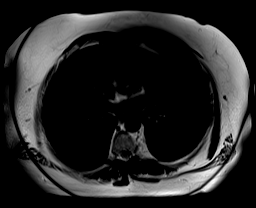

[Series 4: T1 · axial · 6.0mm · 0.74mm/px · z∈[-116,+107]mm · 4 of 64 slices shown]
[im 1/64]
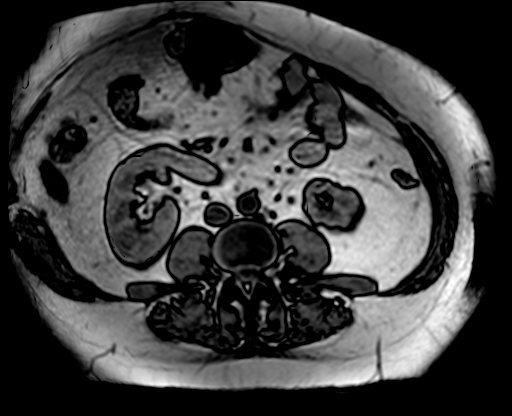
[im 22/64]
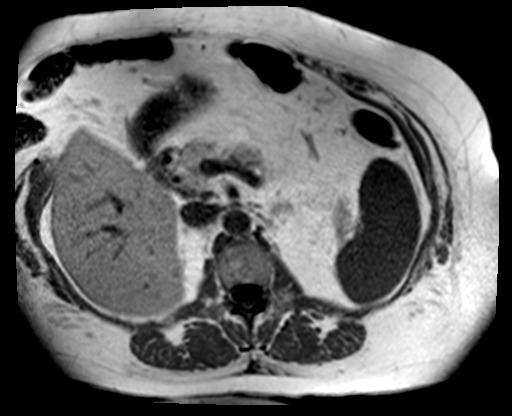
[im 43/64]
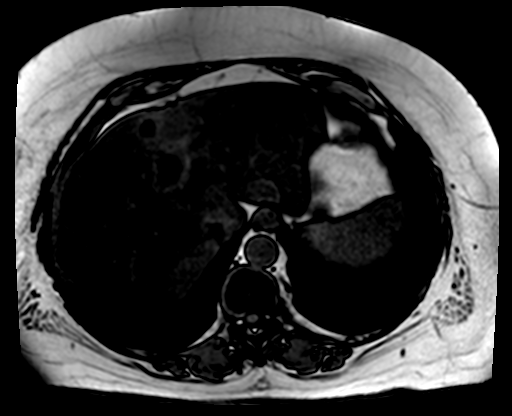
[im 64/64]
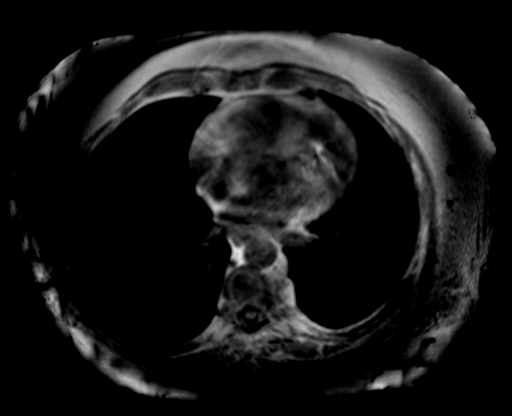

[Series 6: DWI · axial · 6.0mm · 2.00mm/px · z∈[-124,+121]mm · 5 of 105 slices shown (1 of 2)]
[im 1/105]
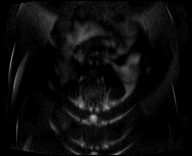
[im 27/105]
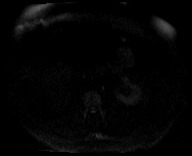
[im 53/105]
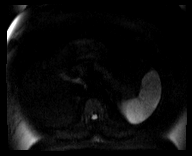
[im 79/105]
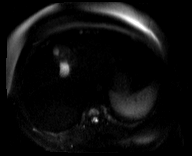
[im 105/105]
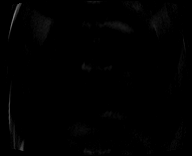

[Series 7: DWI · axial · 6.0mm · 2.00mm/px · z∈[-124,+121]mm · 2 of 35 slices shown (2 of 2)]
[im 1/35]
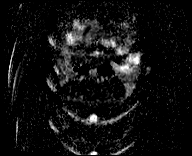
[im 35/35]
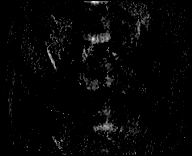

[Series 8: bSSFP · axial · 6.0mm · 0.74mm/px · 1 of 35 slices shown]
[im 1/35]
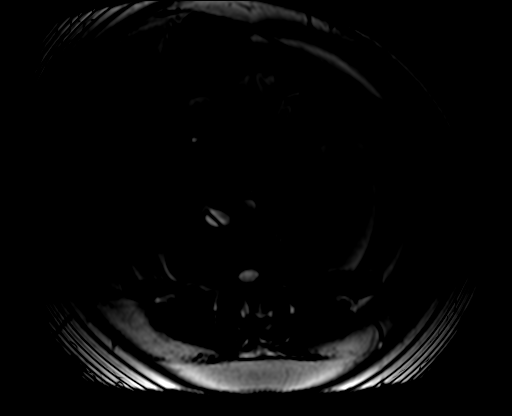

[Series 9: axial dynamic pre · axial · non-contrast · 3.0mm · 1.19mm/px · z∈[-120,+117]mm · 3 of 80 slices shown]
[im 1/80]
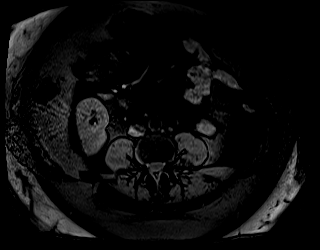
[im 40/80]
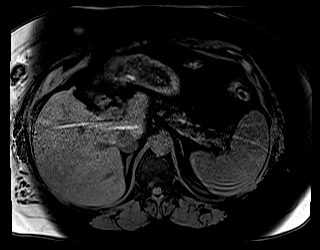
[im 80/80]
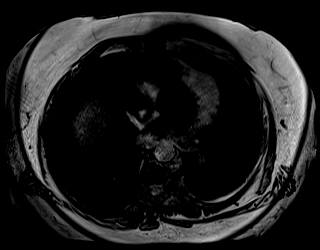

[Series 10: axial dynamic post · axial · 3.0mm · 1.19mm/px · z∈[-120,+117]mm · 3 of 80 slices shown (1 of 6)]
[im 1/80]
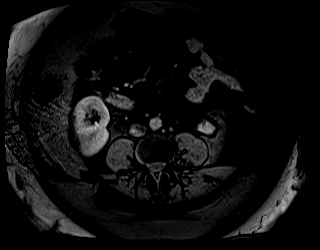
[im 40/80]
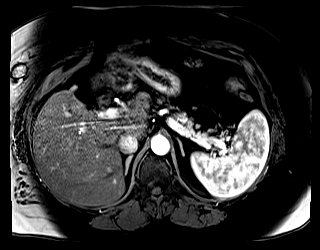
[im 80/80]
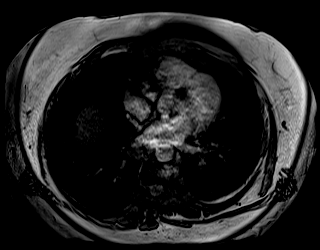

[Series 11: axial dynamic post · axial · 3.0mm · 1.19mm/px · z∈[-120,+117]mm · 3 of 80 slices shown (2 of 6)]
[im 1/80]
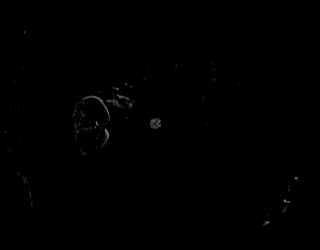
[im 40/80]
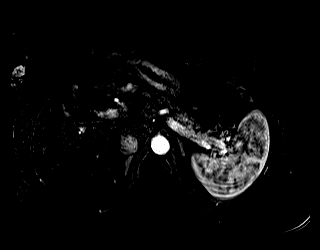
[im 80/80]
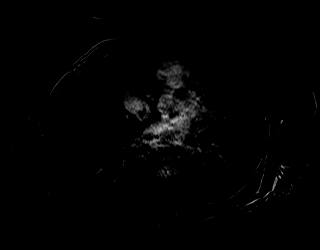

[Series 12: axial dynamic post · axial · 3.0mm · 1.19mm/px · z∈[-120,+117]mm · 3 of 80 slices shown (3 of 6)]
[im 1/80]
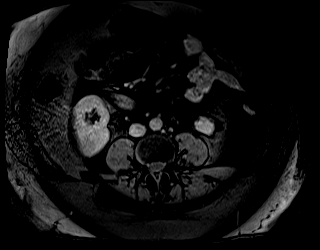
[im 40/80]
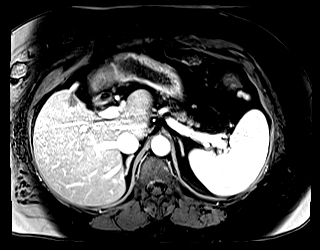
[im 80/80]
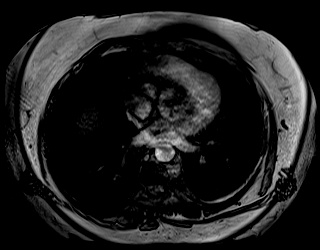

[Series 13: axial dynamic post · axial · 3.0mm · 1.19mm/px · z∈[-120,+117]mm · 3 of 80 slices shown (4 of 6)]
[im 1/80]
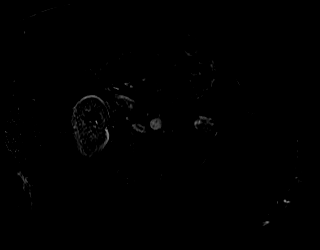
[im 40/80]
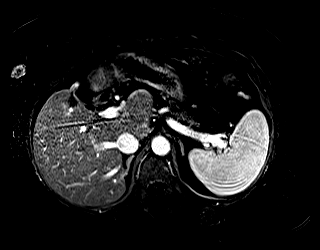
[im 80/80]
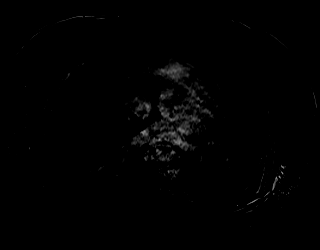

[Series 14: axial dynamic post · axial · 3.0mm · 1.19mm/px · z∈[-120,+117]mm · 3 of 80 slices shown (5 of 6)]
[im 1/80]
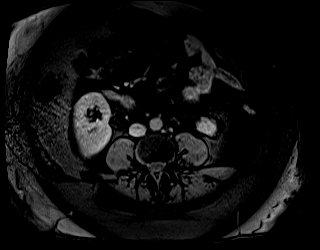
[im 40/80]
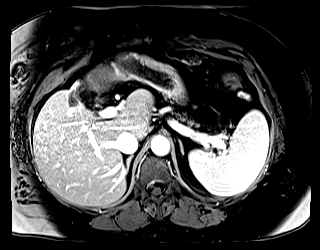
[im 80/80]
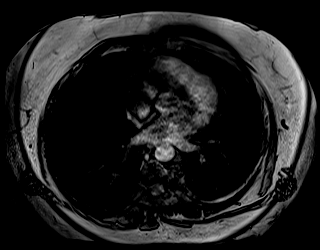

[Series 15: axial dynamic post · axial · 3.0mm · 1.19mm/px · z∈[-120,+117]mm · 3 of 80 slices shown (6 of 6)]
[im 1/80]
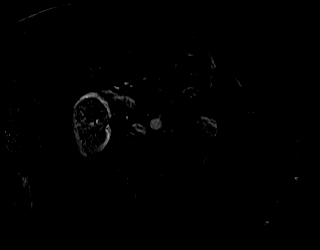
[im 40/80]
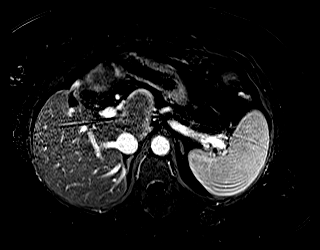
[im 80/80]
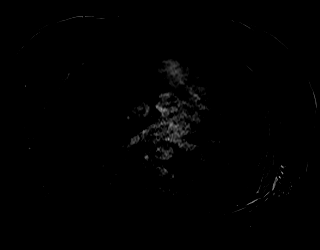

[Series 17: axial dynamic delayed · axial · 3.0mm · 1.19mm/px · z∈[-120,+117]mm · 3 of 80 slices shown]
[im 1/80]
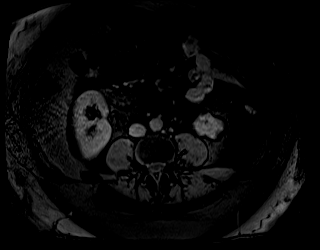
[im 40/80]
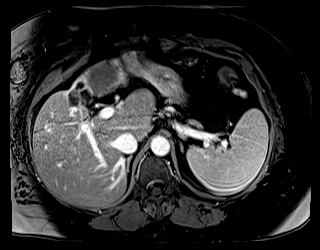
[im 80/80]
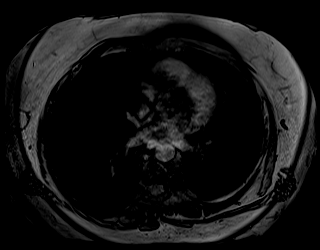

[Series 18: axial dynamic delayed_sub · axial · 3.0mm · 1.19mm/px · z∈[-120,+117]mm · 3 of 80 slices shown]
[im 1/80]
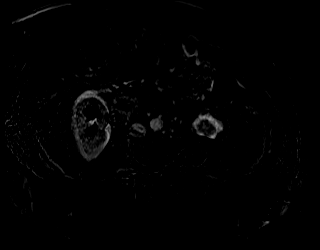
[im 40/80]
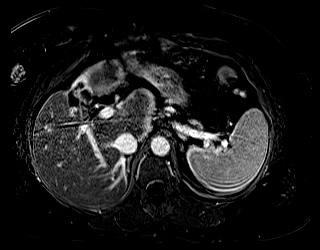
[im 80/80]
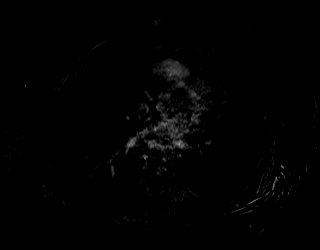

[Series 19: axial ssfse / · axial · 6.0mm · 1.19mm/px · 1 of 32 slices shown]
[im 1/32]
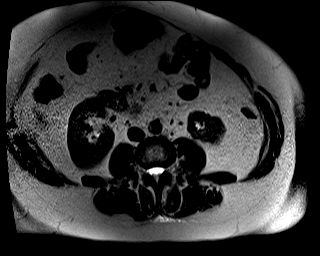

[44 of 48 positions shown; findings below may reference images not displayed]

FINDINGS: Lower chest: No acute findings.

Hepatobiliary: Marked diffuse hepatic steatosis. Complex cystic
lesion in segment 4 B is again noted. This exhibits mild increased
T1 and T2 signal intensity measuring 4.7 by 5.0 cm, image 46/4. On
07/11/2016 this measured 5.4 x 5.4 cm. No internal enhancement
associated with this structure. There is an adjacent multi septated
cystic structure which measures 2.4 cm, image 36/12 39/14. This is
mildly increased in size from 07/11/2016 when it measured 2.1 cm.
Before that, this structure measured 7.6 cm. Within segment 4 a
there are 2 cystic lesions which appears similar to 07/18/2017
measuring up to 2.8 cm. No suspicious enhancing liver lesions
specific for metastatic disease identified.Previous cholecystectomy.
No biliary dilatation.

Pancreas: No mass, inflammatory changes, or other parenchymal
abnormality identified.

Spleen:  Within normal limits in size and appearance.

Adrenals/Urinary Tract: Normal appearance of the adrenal glands.
Asymmetric left renal cortical scarring identified. Normal
appearance of the right kidney.

Stomach/Bowel: Visualized portions within the abdomen are
unremarkable.

Vascular/Lymphatic: No pathologically enlarged lymph nodes
identified. No abdominal aortic aneurysm demonstrated.

Other:  No free fluid or fluid collection.

Musculoskeletal: No suspicious bone lesions identified.
IMPRESSION: 1. Again seen are multiple complex cystic lesions involving left
lobe of liver. These lesions have been present over multiple prior
examinations and over the course of these exams have demonstrated
intermittent increase [REDACTED]rease in size dating back to 12/27/2013.
These are most likely represent benign lesions.
2. No suspicious enhancing liver abnormalities identified at this
time to suggest liver metastasis.
3. Hepatic steatosis.
4. Left renal cortical scarring.

## 2019-07-24 ENCOUNTER — Other Ambulatory Visit: Payer: Self-pay | Admitting: Internal Medicine

## 2019-07-24 DIAGNOSIS — F329 Major depressive disorder, single episode, unspecified: Secondary | ICD-10-CM

## 2019-07-24 DIAGNOSIS — F419 Anxiety disorder, unspecified: Secondary | ICD-10-CM

## 2019-07-26 NOTE — Telephone Encounter (Signed)
Call pt   I have refilled your xanax  However I wanted to remind you that this is controlled substance.   In order for me to prescribe medication,  patients must be seen every 3 months.   Please make follow-up appointment this month for any further refills.    I looked up patient on [place] Controlled Substances Reporting System and saw no activity that raised concern of inappropriate use.   

## 2019-07-27 NOTE — Telephone Encounter (Signed)
Mychart message sent.

## 2019-08-23 ENCOUNTER — Other Ambulatory Visit: Payer: Self-pay | Admitting: Internal Medicine

## 2019-08-23 ENCOUNTER — Other Ambulatory Visit: Payer: Self-pay | Admitting: Family

## 2019-08-23 DIAGNOSIS — F329 Major depressive disorder, single episode, unspecified: Secondary | ICD-10-CM

## 2019-08-23 DIAGNOSIS — C20 Malignant neoplasm of rectum: Secondary | ICD-10-CM

## 2019-08-23 DIAGNOSIS — F419 Anxiety disorder, unspecified: Secondary | ICD-10-CM

## 2019-08-25 ENCOUNTER — Other Ambulatory Visit: Payer: Self-pay

## 2019-09-10 ENCOUNTER — Other Ambulatory Visit: Payer: Self-pay | Admitting: Family

## 2019-09-10 DIAGNOSIS — F329 Major depressive disorder, single episode, unspecified: Secondary | ICD-10-CM

## 2019-09-10 DIAGNOSIS — F419 Anxiety disorder, unspecified: Secondary | ICD-10-CM

## 2019-09-14 ENCOUNTER — Other Ambulatory Visit: Payer: Self-pay | Admitting: Family

## 2019-09-14 DIAGNOSIS — F329 Major depressive disorder, single episode, unspecified: Secondary | ICD-10-CM

## 2019-09-14 DIAGNOSIS — F32A Depression, unspecified: Secondary | ICD-10-CM

## 2019-09-14 DIAGNOSIS — F419 Anxiety disorder, unspecified: Secondary | ICD-10-CM

## 2019-09-14 MED ORDER — ALPRAZOLAM 0.25 MG PO TABS: 0.2500 mg | ORAL_TABLET | Freq: Two times a day (BID) | ORAL | 1 refills | Status: DC | PRN

## 2019-09-14 NOTE — Telephone Encounter (Signed)
Patient has upcoming appoint with me in March.  I looked up patient on [place] Controlled Substances Reporting System and saw no activity that raised concern of inappropriate use.

## 2019-09-14 NOTE — Addendum Note (Signed)
Addended by: Burnard Hawthorne on: 09/14/2019 01:19 PM   Modules accepted: Orders

## 2019-09-14 NOTE — Telephone Encounter (Signed)
Refill request for xanax, last seen 01-20-19, last filled 07-26-19.  Please advise.

## 2019-09-14 NOTE — Telephone Encounter (Signed)
Pt will need a refill on ALPRAZolam (XANAX) 0.25 MG tablet next week sent [place]  Pt scheduled an appt for a med refill on 10/12/19

## 2019-09-16 ENCOUNTER — Other Ambulatory Visit: Payer: Self-pay

## 2019-09-16 DIAGNOSIS — F329 Major depressive disorder, single episode, unspecified: Secondary | ICD-10-CM

## 2019-09-16 DIAGNOSIS — F32A Depression, unspecified: Secondary | ICD-10-CM

## 2019-09-16 MED ORDER — BUPROPION HCL ER (XL) 150 MG PO TB24: 300.0000 mg | ORAL_TABLET | Freq: Every morning | ORAL | 3 refills | Status: DC

## 2019-10-12 ENCOUNTER — Telehealth: Payer: Self-pay | Admitting: Family

## 2019-10-12 ENCOUNTER — Telehealth (INDEPENDENT_AMBULATORY_CARE_PROVIDER_SITE_OTHER): Payer: Self-pay | Admitting: Family

## 2019-10-12 ENCOUNTER — Encounter: Payer: Self-pay | Admitting: Family

## 2019-10-12 VITALS — BP 121/63 | HR 68 | Ht 65.0 in | Wt 245.0 lb

## 2019-10-12 DIAGNOSIS — C20 Malignant neoplasm of rectum: Secondary | ICD-10-CM

## 2019-10-12 DIAGNOSIS — F329 Major depressive disorder, single episode, unspecified: Secondary | ICD-10-CM

## 2019-10-12 DIAGNOSIS — I10 Essential (primary) hypertension: Secondary | ICD-10-CM

## 2019-10-12 DIAGNOSIS — F419 Anxiety disorder, unspecified: Secondary | ICD-10-CM

## 2019-10-12 DIAGNOSIS — Z85038 Personal history of other malignant neoplasm of large intestine: Secondary | ICD-10-CM

## 2019-10-12 DIAGNOSIS — Z1231 Encounter for screening mammogram for malignant neoplasm of breast: Secondary | ICD-10-CM

## 2019-10-12 MED ORDER — SERTRALINE HCL 50 MG PO TABS: 50.0000 mg | ORAL_TABLET | Freq: Every day | ORAL | 3 refills | Status: DC

## 2019-10-12 NOTE — Assessment & Plan Note (Signed)
Uncontrolled.  Will increase Zoloft to 150 mg see if helps with depression, insomnia.  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  We jointly agreed referral to psychiatry was appropriate next step. Referral has been placed.

## 2019-10-12 NOTE — Assessment & Plan Note (Signed)
Stable, continue amlodipine.  

## 2019-10-12 NOTE — Assessment & Plan Note (Signed)
Continues to follow Dr Burlene Arnt.  Due for colonoscopy.  I have placed a referral.

## 2019-10-12 NOTE — Progress Notes (Signed)
Virtual Visit via Video Note  I connected with@  on 10/12/19 at 10:00 AM EDT by a video enabled telemedicine application and verified that I am speaking with the correct person using two identifiers.  Location patient: home Location provider:work Persons participating in the virtual visit: patient, provider  I discussed the limitations of evaluation and management by telemedicine and the availability of in person appointments. The patient expressed understanding and agreed to proceed.   HPI:  Worsening depression. Trouble sleeping. Trouble getting going in the morning. 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 No exercise.  Trazodone was not helpful.   Taking zoloft, wellbutrin. Uses xanax BID which is somewhat helpful.  No si/hi.   HTN- 121/63.Denies exertional chest pain or pressure, numbness or tingling radiating to left arm or jaw, palpitations, dizziness, frequent headaches, changes in vision, or shortness of breath.   ROS: See pertinent positives and negatives per HPI.  Due colonscopy, mammogram Past Medical History:  Diagnosis Date  . Anemia    prior to CA dx  . Asthma    seasonal,never hospitalized  . Blood in stool   . Colostomy in place [facility])   . History of ERCP   . Hypertension   . Migraine   . 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 PTSD (post-traumatic stress disorder)   . Rectal cancer ([place])   . Wears contact lenses     Past Surgical History:  Procedure Laterality Date  . ABDOMINAL HYSTERECTOMY  04/12/2014   [facility] with CA surgery; no cervix ( M.Michael Ventresca 01/2017)  . CHOLECYSTECTOMY  2012   Dr. Jamal Collin  . COLONOSCOPY WITH PROPOFOL N/A 01/26/2016   Procedure: COLONOSCOPY WITH PROPOFOL through colostomy;  Surgeon: Lucilla Lame, MD;  Location: [facility];  Service: Endoscopy;  Laterality: N/A;  port-a-cath  . POLYPECTOMY  01/26/2016   Procedure: POLYPECTOMY;  Surgeon: Lucilla Lame, MD;  Location: [facility];  Service: Endoscopy;;  . PORTA CATH REMOVAL N/A  01/28/2018   Procedure: PORTA CATH REMOVAL;  Surgeon: Algernon Huxley, MD;  Location: [facility];  Service: Cardiovascular;  Laterality: N/A;  . PORTACATH PLACEMENT    . PROCTECTOMY  04/12/14   [facility] - CA surgery - with colostomy  . SALPINGOOPHORECTOMY Bilateral 04/12/14   [facility] - with CA surg  . VAGINA RECONSTRUCTION SURGERY  04/12/14   [facility] - transabdonimal rectus abdominus muscle reconstruction  . VAGINAL DELIVERY     2, preterm labor    Family History  Problem Relation Age of Onset  . Arthritis Mother   . Stroke Mother   . Hypertension Mother   . Diabetes Mother   . Arthritis Father   . Heart disease Father 50       s/p CABG  . Stroke Father   . Hypertension Father   . Alcohol abuse Maternal Grandmother   . Diabetes Maternal Grandmother   . Alcohol abuse Maternal Grandfather   . Diabetes Maternal Grandfather   . Cancer Paternal Grandmother        breast  . Heart disease Paternal Grandfather   . Breast cancer Paternal Aunt 31  . Colon cancer Neg Hx        Current Outpatient Medications:  .  acetaminophen (TYLENOL) 325 MG tablet, Take 325 mg by mouth every 6 (six) hours as needed for mild pain. , Disp: , Rfl:  .  albuterol (PROVENTIL HFA;VENTOLIN HFA) 108 (90 Base) MCG/ACT inhaler, Inhale 1-2 puffs into the lungs every 4 (four) hours as  needed for wheezing or shortness of breath., Disp: 1 Inhaler, Rfl: 3 .  ALPRAZolam (XANAX) 0.25 MG tablet, Take 1 tablet (0.25 mg total) by mouth 2 (two) times daily as needed. for anxiety, Disp: 60 tablet, Rfl: 1 .  amLODipine (NORVASC) 10 MG tablet, TAKE ONE TABLET BY MOUTH ONCE DAILY, Disp: 90 tablet, Rfl: 4 .  buPROPion (WELLBUTRIN XL) 150 MG 24 hr tablet, Take 2 tablets (300 mg total) by mouth every morning., Disp: 60 tablet, Rfl: 3 .  ferrous sulfate 325 (65 FE) MG tablet, Take by mouth 2 (two) times daily with a meal. , Disp: , Rfl:  .  fluticasone (FLONASE) 50 MCG/ACT nasal spray, Place 2 sprays into both nostrils daily., Disp:  16 g, Rfl: 1 .  gabapentin (NEURONTIN) 300 MG capsule, TAKE ONE CAPSULE BY MOUTH THREE TIMES DAILY & ONE CAPSULE AT BEDTIME, Disp: 120 capsule, Rfl: 5 .  sertraline (ZOLOFT) 100 MG tablet, TAKE ONE TABLET BY MOUTH AT BEDTIME, Disp: 90 tablet, Rfl: 1 .  sertraline (ZOLOFT) 50 MG tablet, Take 1 tablet (50 mg total) by mouth at bedtime., Disp: 90 tablet, Rfl: 3 No current facility-administered medications for this visit.  Facility-Administered Medications Ordered in Other Visits:  .  heparin lock flush 100 unit/mL, 500 Units, Intravenous, Once, Pandit, Sandeep, MD .  sodium chloride 0.9 % injection 10 mL, 10 mL, Intravenous, PRN, Ma Hillock, Sandeep, MD .  sodium chloride 0.9 % injection 10 mL, 10 mL, Intravenous, PRN, Ma Hillock, Sandeep, MD, 10 mL at 11/28/14 1120 .  sodium chloride flush (NS) 0.9 % injection 10 mL, 10 mL, Intravenous, PRN, Herring, Orville Govern, NP, 10 mL at 01/03/16 1413 .  sodium chloride flush (NS) 0.9 % injection 10 mL, 10 mL, Intravenous, PRN, Lloyd Huger, MD, 10 mL at 11/15/16 1111 .  sodium chloride flush (NS) 0.9 % injection 10 mL, 10 mL, Intravenous, PRN, Cammie Sickle, MD, 10 mL at 11/24/17 1434  EXAM:  VITALS per patient if applicable:  GENERAL: alert, oriented, appears well and in no acute distress  HEENT: atraumatic, conjunttiva clear, no obvious abnormalities on inspection of external nose and ears  NECK: normal movements of the head and neck  LUNGS: on inspection no signs of respiratory distress, breathing rate appears normal, no obvious gross SOB, gasping or wheezing  CV: no obvious cyanosis  MS: moves all visible extremities without noticeable abnormality  PSYCH/NEURO: pleasant and cooperative, no obvious depression or anxiety, speech and thought processing grossly intact  ASSESSMENT AND PLAN:  Discussed the following assessment and plan:  Anxiety and depression - Plan: Ambulatory referral to Psychiatry, sertraline (ZOLOFT) 50 MG  tablet  Encounter for screening mammogram for malignant neoplasm of breast - Plan: MM 3D SCREEN BREAST BILATERAL  Personal history of colon cancer - Plan: Ambulatory referral to Gastroenterology  Essential hypertension  Rectal cancer ([place]) Problem List Items Addressed This Visit      Cardiovascular and Mediastinum   Hypertension    Stable, continue amlodipine        Digestive   Rectal cancer ([place])    Continues to follow Dr Burlene Arnt.  Due for colonoscopy.  I have placed a referral.         Other   Anxiety and depression - Primary    Uncontrolled.  Will increase Zoloft to 150 mg see if helps with depression, insomnia.  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  We jointly agreed referral to psychiatry was appropriate next  step. Referral has been placed.       Relevant Medications   sertraline (ZOLOFT) 50 MG tablet   Other Relevant Orders   Ambulatory referral to Psychiatry   Personal history of colon cancer   Relevant Orders   Ambulatory referral to Gastroenterology   Screening for breast cancer   Relevant Orders   MM 3D SCREEN BREAST BILATERAL    Of note, patient states that she will schedule mammogram  -we discussed possible serious and likely etiologies, options for evaluation and workup, limitations of telemedicine visit vs in person visit, treatment, treatment risks and precautions. Pt prefers to treat via telemedicine empirically rather then risking or undertaking an in person visit at this moment. Patient agrees to seek prompt in person care if worsening, new symptoms arise, or if is not improving with treatment.   I discussed the assessment and treatment plan with the patient. The patient was provided an opportunity to ask questions and all were answered. The patient agreed with the plan and demonstrated an understanding of the instructions.   The patient was advised to call back or seek an in-person evaluation if the  symptoms worsen or if the condition fails to improve as anticipated.   [name], FNP

## 2019-10-12 NOTE — Telephone Encounter (Signed)
lvm for pt to schedule 3 month follow up 

## 2019-10-12 NOTE — Patient Instructions (Signed)
Increase of zoloft to 150mg  Referral to psychiatry. Referral for colonoscopy as well. Please schedule mammogram as we discussed.  I ordered today.  Stay safe

## 2019-10-14 ENCOUNTER — Encounter: Payer: Self-pay | Admitting: Family

## 2019-10-22 ENCOUNTER — Ambulatory Visit: Payer: Self-pay | Attending: Internal Medicine

## 2019-10-22 DIAGNOSIS — Z23 Encounter for immunization: Secondary | ICD-10-CM

## 2019-10-22 NOTE — Progress Notes (Signed)
   Covid-19 Vaccination Clinic  Name:  Tammie Robinson    MRN: [id number] DOB: March 21, 1966  10/22/2019  [name] was observed post Covid-19 immunization for 15 minutes without incident. She was provided with Vaccine Information Sheet and instruction to access the V-Safe system.   [name] was instructed to call 911 with any severe reactions post vaccine: Marland Kitchen Difficulty breathing  . Swelling of face and throat  . A fast heartbeat  . A bad rash all over body  . Dizziness and weakness   Immunizations Administered    Name Date Dose VIS Date Route   Pfizer COVID-19 Vaccine 10/22/2019 10:35 AM 0.3 mL 07/02/2019 Intramuscular   Manufacturer: [employer]   Lot: [id number]   [place]: [id number]

## 2019-10-25 ENCOUNTER — Ambulatory Visit (INDEPENDENT_AMBULATORY_CARE_PROVIDER_SITE_OTHER): Payer: Self-pay | Admitting: Gastroenterology

## 2019-10-25 DIAGNOSIS — Z1211 Encounter for screening for malignant neoplasm of colon: Secondary | ICD-10-CM

## 2019-10-25 DIAGNOSIS — Z85038 Personal history of other malignant neoplasm of large intestine: Secondary | ICD-10-CM

## 2019-10-25 MED ORDER — NA SULFATE-K SULFATE-MG SULF 17.5-3.13-1.6 GM/177ML PO SOLN: 1.0000 | Freq: Once | ORAL | 0 refills | Status: AC

## 2019-10-25 NOTE — Progress Notes (Signed)
Gastroenterology Pre-Procedure Review  Request Date: Monday 11/29/19 Requesting Physician: Dr. Allen Locurto  PATIENT REVIEW QUESTIONS: The patient responded to the following health history questions as indicated:    1. Are you having any GI issues? no 2. Do you have a personal history of Polyps? yes (Personal history of rectal cancer) 3. Do you have a family history of Colon Cancer or Polyps? no 4. Diabetes Mellitus? no 5. Joint replacements in the past 12 months?no 6. Major health problems in the past 3 months?no 7. Any artificial heart valves, MVP, or defibrillator?no    MEDICATIONS & ALLERGIES:    Patient reports the following regarding taking any anticoagulation/antiplatelet therapy:   Plavix, Coumadin, Eliquis, Xarelto, Lovenox, Pradaxa, Brilinta, or Effient? no Aspirin? no  Patient confirms/reports the following medications:  Current Outpatient Medications  Medication Sig Dispense Refill  . acetaminophen (TYLENOL) 325 MG tablet Take 325 mg by mouth every 6 (six) hours as needed for mild pain.     Marland Kitchen albuterol (PROVENTIL HFA;VENTOLIN HFA) 108 (90 Base) MCG/ACT inhaler Inhale 1-2 puffs into the lungs every 4 (four) hours as needed for wheezing or shortness of breath. 1 Inhaler 3  . ALPRAZolam (XANAX) 0.25 MG tablet Take 1 tablet (0.25 mg total) by mouth 2 (two) times daily as needed. for anxiety 60 tablet 1  . amLODipine (NORVASC) 10 MG tablet TAKE ONE TABLET BY MOUTH ONCE DAILY 90 tablet 4  . buPROPion (WELLBUTRIN XL) 150 MG 24 hr tablet Take 2 tablets (300 mg total) by mouth every morning. 60 tablet 3  . ferrous sulfate 325 (65 FE) MG tablet Take by mouth 2 (two) times daily with a meal.     . fluticasone (FLONASE) 50 MCG/ACT nasal spray Place 2 sprays into both nostrils daily. 16 g 1  . gabapentin (NEURONTIN) 300 MG capsule TAKE ONE CAPSULE BY MOUTH THREE TIMES DAILY & ONE CAPSULE AT BEDTIME 120 capsule 5  . Na Sulfate-K Sulfate-Mg Sulf 17.5-3.13-1.6 GM/177ML SOLN Take 1 kit by mouth  once for 1 dose. 354 mL 0  . sertraline (ZOLOFT) 100 MG tablet TAKE ONE TABLET BY MOUTH AT BEDTIME 90 tablet 1  . sertraline (ZOLOFT) 50 MG tablet Take 1 tablet (50 mg total) by mouth at bedtime. 90 tablet 3   No current facility-administered medications for this visit.   Facility-Administered Medications Ordered in Other Visits  Medication Dose Route Frequency Provider Last Rate Last Admin  . heparin lock flush 100 unit/mL  500 Units Intravenous Once Leia Alf, MD      . sodium chloride 0.9 % injection 10 mL  10 mL Intravenous PRN Ma Hillock, Sandeep, MD      . sodium chloride 0.9 % injection 10 mL  10 mL Intravenous PRN Leia Alf, MD   10 mL at 11/28/14 1120  . sodium chloride flush (NS) 0.9 % injection 10 mL  10 mL Intravenous PRN Evlyn Kanner, NP   10 mL at 01/03/16 1413  . sodium chloride flush (NS) 0.9 % injection 10 mL  10 mL Intravenous PRN Lloyd Huger, MD   10 mL at 11/15/16 1111  . sodium chloride flush (NS) 0.9 % injection 10 mL  10 mL Intravenous PRN Cammie Sickle, MD   10 mL at 11/24/17 1434    Patient confirms/reports the following allergies:  Allergies  Allergen Reactions  . Lisinopril Hives and Rash    No orders of the defined types were placed in this encounter.   AUTHORIZATION INFORMATION Primary Insurance: 1D#: Group #:  Secondary Insurance: 1D#: Group #:  SCHEDULE INFORMATION: Date: 11/29/19 Time: Location:[facility]

## 2019-11-15 ENCOUNTER — Ambulatory Visit: Payer: Self-pay | Attending: Internal Medicine

## 2019-11-15 DIAGNOSIS — Z23 Encounter for immunization: Secondary | ICD-10-CM

## 2019-11-15 NOTE — Progress Notes (Signed)
   Covid-19 Vaccination Clinic  Name:  Tammie Robinson    MRN: [id number] DOB: 1965/08/22  11/15/2019  Ms. Tammie Robinson was observed post Covid-19 immunization for 15 minutes without incident. She was provided with Vaccine Information Sheet and instruction to access the V-Safe system.   Ms. Tammie Robinson was instructed to call 911 with any severe reactions post vaccine: Marland Kitchen Difficulty breathing  . Swelling of face and throat  . A fast heartbeat  . A bad rash all over body  . Dizziness and weakness   Immunizations Administered    Name Date Dose VIS Date Route   Pfizer COVID-19 Vaccine 11/15/2019 12:12 PM 0.3 mL 09/15/2018 Intramuscular   Manufacturer: [place]   Lot: [id number]   [place]: [id number]

## 2019-11-18 ENCOUNTER — Encounter: Payer: Self-pay | Admitting: Gastroenterology

## 2019-11-18 ENCOUNTER — Other Ambulatory Visit: Payer: Self-pay

## 2019-11-21 ENCOUNTER — Other Ambulatory Visit: Payer: Self-pay | Admitting: Family

## 2019-11-21 DIAGNOSIS — F419 Anxiety disorder, unspecified: Secondary | ICD-10-CM

## 2019-11-21 DIAGNOSIS — F329 Major depressive disorder, single episode, unspecified: Secondary | ICD-10-CM

## 2019-11-22 ENCOUNTER — Other Ambulatory Visit: Payer: Self-pay

## 2019-11-22 ENCOUNTER — Encounter: Payer: Self-pay | Admitting: Family

## 2019-11-22 NOTE — Telephone Encounter (Signed)
Refill request for xanax, last seen 10-12-19, last filled 09-14-19.  Please advise.

## 2019-11-23 ENCOUNTER — Encounter: Payer: Self-pay | Admitting: Family

## 2019-11-25 ENCOUNTER — Other Ambulatory Visit: Payer: Self-pay

## 2019-11-25 ENCOUNTER — Other Ambulatory Visit
Admission: RE | Admit: 2019-11-25 | Discharge: 2019-11-25 | Disposition: A | Discharge status: Home / Self Care | Payer: HRSA Program | Source: Ambulatory Visit | Attending: Gastroenterology | Admitting: Gastroenterology

## 2019-11-25 DIAGNOSIS — Z20822 Contact with and (suspected) exposure to covid-19: Secondary | ICD-10-CM | POA: Insufficient documentation

## 2019-11-25 DIAGNOSIS — Z01812 Encounter for preprocedural laboratory examination: Secondary | ICD-10-CM | POA: Diagnosis present

## 2019-11-25 LAB — SARS CORONAVIRUS 2 (TAT 6-24 HRS): SARS Coronavirus 2: NEGATIVE

## 2019-11-25 NOTE — Discharge Instructions (Signed)
General Anesthesia, Adult, Care After This sheet gives you information about how to care for yourself after your procedure. Your health care provider may also give you more specific instructions. If you have problems or questions, contact your health care provider. What can I expect after the procedure? After the procedure, the following side effects are common:  Pain or discomfort at the IV site.  Nausea.  Vomiting.  Sore throat.  Trouble concentrating.  Feeling cold or chills.  Weak or tired.  Sleepiness and fatigue.  Soreness and body aches. These side effects can affect parts of the body that were not involved in surgery. Follow these instructions at home:  For at least 24 hours after the procedure:  Have a responsible adult stay with you. It is important to have someone help care for you until you are awake and alert.  Rest as needed.  Do not: ? Participate in activities in which you could fall or become injured. ? Drive. ? Use heavy machinery. ? Drink alcohol. ? Take sleeping pills or medicines that cause drowsiness. ? Make important decisions or sign legal documents. ? Take care of children on your own. Eating and drinking  Follow any instructions from your health care provider about eating or drinking restrictions.  When you feel hungry, start by eating small amounts of foods that are soft and easy to digest (bland), such as toast. Gradually return to your regular diet.  Drink enough fluid to keep your urine pale yellow.  If you vomit, rehydrate by drinking water, juice, or clear broth. General instructions  If you have sleep apnea, surgery and certain medicines can increase your risk for breathing problems. Follow instructions from your health care provider about wearing your sleep device: ? Anytime you are sleeping, including during daytime naps. ? While taking prescription pain medicines, sleeping medicines, or medicines that make you drowsy.  Return to  your normal activities as told by your health care provider. Ask your health care provider what activities are safe for you.  Take over-the-counter and prescription medicines only as told by your health care provider.  If you smoke, do not smoke without supervision.  Keep all follow-up visits as told by your health care provider. This is important. Contact a health care provider if:  You have nausea or vomiting that does not get better with medicine.  You cannot eat or drink without vomiting.  You have pain that does not get better with medicine.  You are unable to pass urine.  You develop a skin rash.  You have a fever.  You have redness around your IV site that gets worse. Get help right away if:  You have difficulty breathing.  You have chest pain.  You have blood in your urine or stool, or you vomit blood. Summary  After the procedure, it is common to have a sore throat or nausea. It is also common to feel tired.  Have a responsible adult stay with you for the first 24 hours after general anesthesia. It is important to have someone help care for you until you are awake and alert.  When you feel hungry, start by eating small amounts of foods that are soft and easy to digest (bland), such as toast. Gradually return to your regular diet.  Drink enough fluid to keep your urine pale yellow.  Return to your normal activities as told by your health care provider. Ask your health care provider what activities are safe for you. This information is not   intended to replace advice given to you by your health care provider. Make sure you discuss any questions you have with your health care provider. Document Revised: 07/11/2017 Document Reviewed: 02/21/2017 Elsevier Patient Education  2020 [employer]  

## 2019-11-29 ENCOUNTER — Ambulatory Visit: Payer: Self-pay | Admitting: Anesthesiology

## 2019-11-29 ENCOUNTER — Encounter: Payer: Self-pay | Admitting: Gastroenterology

## 2019-11-29 ENCOUNTER — Other Ambulatory Visit: Payer: Self-pay

## 2019-11-29 ENCOUNTER — Ambulatory Visit
Admission: RE | Admit: 2019-11-29 | Discharge: 2019-11-29 | Disposition: A | Discharge status: Home / Self Care | Payer: Self-pay | Attending: Gastroenterology | Admitting: Gastroenterology

## 2019-11-29 ENCOUNTER — Encounter
Admission: RE | Disposition: A | Discharge status: Home / Self Care | Payer: Self-pay | Source: Home / Self Care | Attending: Gastroenterology

## 2019-11-29 DIAGNOSIS — I10 Essential (primary) hypertension: Secondary | ICD-10-CM | POA: Insufficient documentation

## 2019-11-29 DIAGNOSIS — J45909 Unspecified asthma, uncomplicated: Secondary | ICD-10-CM | POA: Insufficient documentation

## 2019-11-29 DIAGNOSIS — Z860100 Personal history of colon polyps, unspecified: Secondary | ICD-10-CM

## 2019-11-29 DIAGNOSIS — F419 Anxiety disorder, unspecified: Secondary | ICD-10-CM | POA: Insufficient documentation

## 2019-11-29 DIAGNOSIS — F4310 Post-traumatic stress disorder, unspecified: Secondary | ICD-10-CM | POA: Insufficient documentation

## 2019-11-29 DIAGNOSIS — D124 Benign neoplasm of descending colon: Secondary | ICD-10-CM | POA: Insufficient documentation

## 2019-11-29 DIAGNOSIS — Z933 Colostomy status: Secondary | ICD-10-CM | POA: Insufficient documentation

## 2019-11-29 DIAGNOSIS — Z9221 Personal history of antineoplastic chemotherapy: Secondary | ICD-10-CM | POA: Insufficient documentation

## 2019-11-29 DIAGNOSIS — K635 Polyp of colon: Secondary | ICD-10-CM

## 2019-11-29 DIAGNOSIS — Z1211 Encounter for screening for malignant neoplasm of colon: Secondary | ICD-10-CM | POA: Insufficient documentation

## 2019-11-29 DIAGNOSIS — Z85048 Personal history of other malignant neoplasm of rectum, rectosigmoid junction, and anus: Secondary | ICD-10-CM | POA: Insufficient documentation

## 2019-11-29 DIAGNOSIS — Z79899 Other long term (current) drug therapy: Secondary | ICD-10-CM | POA: Insufficient documentation

## 2019-11-29 DIAGNOSIS — Z85038 Personal history of other malignant neoplasm of large intestine: Secondary | ICD-10-CM

## 2019-11-29 HISTORY — PX: COLONOSCOPY WITH PROPOFOL: SHX5780

## 2019-11-29 HISTORY — PX: POLYPECTOMY: SHX5525

## 2019-11-29 HISTORY — DX: Cardiac murmur, unspecified: R01.1

## 2019-11-29 SURGERY — COLONOSCOPY WITH PROPOFOL
Anesthesia: General | Site: Rectum

## 2019-11-29 MED ORDER — LIDOCAINE HCL (CARDIAC) PF 100 MG/5ML IV SOSY
PREFILLED_SYRINGE | INTRAVENOUS | Status: DC | PRN
  Administered 2019-11-29: 30 mg via INTRAVENOUS

## 2019-11-29 MED ORDER — STERILE WATER FOR IRRIGATION IR SOLN
Status: DC | PRN
  Administered 2019-11-29: 50 mL

## 2019-11-29 MED ORDER — PROPOFOL 10 MG/ML IV BOLUS
INTRAVENOUS | Status: DC | PRN
  Administered 2019-11-29: 40 mg via INTRAVENOUS
  Administered 2019-11-29: 20 mg via INTRAVENOUS
  Administered 2019-11-29: 40 mg via INTRAVENOUS
  Administered 2019-11-29: 100 mg via INTRAVENOUS
  Administered 2019-11-29: 40 mg via INTRAVENOUS
  Administered 2019-11-29 (×2): 50 mg via INTRAVENOUS

## 2019-11-29 MED ORDER — LACTATED RINGERS IV SOLN: INTRAVENOUS | Status: DC

## 2019-11-29 SURGICAL SUPPLY — 7 items
GOWN CVR UNV OPN BCK APRN NK (MISCELLANEOUS) ×2 IMPLANT
GOWN ISOL THUMB LOOP REG UNIV (MISCELLANEOUS) ×6
KIT ENDO PROCEDURE OLY (KITS) ×3 IMPLANT
MANIFOLD 4PT FOR NEPTUNE1 (MISCELLANEOUS) ×2 IMPLANT
SNARE SHORT THROW 13M SML OVAL (MISCELLANEOUS) ×2 IMPLANT
TRAP ETRAP POLY (MISCELLANEOUS) ×2 IMPLANT
WATER STERILE IRR 250ML POUR (IV SOLUTION) ×3 IMPLANT

## 2019-11-29 NOTE — Anesthesia Preprocedure Evaluation (Signed)
Anesthesia Evaluation  Patient identified by MRN, date of birth, ID band Patient awake    Reviewed: Allergy & Precautions, H&P , NPO status , Patient's Chart, lab work & pertinent test results  History of Anesthesia Complications Negative for: history of anesthetic complications  Airway Mallampati: II  TM Distance: >3 FB Neck ROM: full    Dental no notable dental hx.    Pulmonary    Pulmonary exam normal breath sounds clear to auscultation       Cardiovascular hypertension, On Medications Normal cardiovascular exam Rhythm:regular Rate:Normal     Neuro/Psych  Headaches, Anxiety    GI/Hepatic negative GI ROS, Neg liver ROS,   Endo/Other  negative endocrine ROS  Renal/GU negative Renal ROS     Musculoskeletal   Abdominal   Peds  Hematology negative hematology ROS (+)   Anesthesia Other Findings   Reproductive/Obstetrics                             Anesthesia Physical Anesthesia Plan  ASA: II  Anesthesia Plan: General   Post-op Pain Management:    Induction:   PONV Risk Score and Plan: 3 and Ondansetron and Propofol infusion  Airway Management Planned:   Additional Equipment:   Intra-op Plan:   Post-operative Plan:   Informed Consent: I have reviewed the patients History and Physical, chart, labs and discussed the procedure including the risks, benefits and alternatives for the proposed anesthesia with the patient or authorized representative who has indicated his/her understanding and acceptance.       Plan Discussed with:   Anesthesia Plan Comments:         Anesthesia Quick Evaluation

## 2019-11-29 NOTE — Anesthesia Procedure Notes (Signed)
Performed by: Ido Wollman, CRNA Pre-anesthesia Checklist: Patient identified, Emergency Drugs available, Suction available, Timeout performed and Patient being monitored Patient Re-evaluated:Patient Re-evaluated prior to induction Oxygen Delivery Method: Nasal cannula Placement Confirmation: positive ETCO2       

## 2019-11-29 NOTE — Anesthesia Postprocedure Evaluation (Signed)
Anesthesia Post Note  Patient: Tammie Robinson  Procedure(s) Performed: COLONOSCOPY WITH PROPOFOL (N/A Rectum) POLYPECTOMY (N/A Rectum)     Patient location during evaluation: PACU Anesthesia Type: General Level of consciousness: awake and alert Pain management: pain level controlled Vital Signs Assessment: post-procedure vital signs reviewed and stable Respiratory status: spontaneous breathing Cardiovascular status: stable Anesthetic complications: no    Jaci Standard, III,  Cadie Sorci D

## 2019-11-29 NOTE — H&P (Signed)
Lucilla Lame, MD [facility] [address] [place], [place] [address] Phone:[phone] Fax : [phone]  Primary Care Physician:  Burnard Hawthorne, FNP Primary Gastroenterologist:  Dr. Allen Sandin  Pre-Procedure History & Physical: HPI:  Tammie Robinson is a 54 y.o. female is here for an colonoscopy.   Past Medical History:  Diagnosis Date  . Anemia    prior to CA dx  . Asthma    seasonal,never hospitalized  . Blood in stool   . Colostomy in place [facility])   . History of ERCP   . Hypertension   . Migraine   . 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 PTSD (post-traumatic stress disorder)   . Rectal cancer ([place])   . Systolic murmur   . Wears contact lenses     Past Surgical History:  Procedure Laterality Date  . ABDOMINAL HYSTERECTOMY  04/12/2014   [facility] with CA surgery; no cervix ( M.Arnett 01/2017)  . CHOLECYSTECTOMY  2012   Dr. Jamal Collin  . COLONOSCOPY WITH PROPOFOL N/A 01/26/2016   Procedure: COLONOSCOPY WITH PROPOFOL through colostomy;  Surgeon: Lucilla Lame, MD;  Location: [place];  Service: Endoscopy;  Laterality: N/A;  port-a-cath  . POLYPECTOMY  01/26/2016   Procedure: POLYPECTOMY;  Surgeon: Lucilla Lame, MD;  Location: [place];  Service: Endoscopy;;  . PORTA CATH REMOVAL N/A 01/28/2018   Procedure: PORTA CATH REMOVAL;  Surgeon: Algernon Huxley, MD;  Location: [place] [facility];  Service: Cardiovascular;  Laterality: N/A;  . PORTACATH PLACEMENT    . PROCTECTOMY  04/12/14   [facility] - CA surgery - with colostomy  . SALPINGOOPHORECTOMY Bilateral 04/12/14   [facility] - with CA surg  . VAGINA RECONSTRUCTION SURGERY  04/12/14   [facility] - transabdonimal rectus abdominus muscle reconstruction  . VAGINAL DELIVERY     2, preterm labor    Prior to Admission medications   Medication Sig Start Date End Date Taking? Authorizing Provider  acetaminophen (TYLENOL) 325 MG tablet Take 325 mg by mouth every 6 (six) hours as needed for mild pain.    Yes [provider]  albuterol (PROVENTIL HFA;VENTOLIN HFA) 108 (90 Base) MCG/ACT inhaler Inhale 1-2 puffs into the lungs every 4 (four) hours as needed for wheezing or shortness of breath. 06/10/18  Yes Burnard Hawthorne, FNP  ALPRAZolam Duanne Moron) 0.25 MG tablet TAKE ONE TABLET BY MOUTH TWICE DAILY AS NEEDED FOR ANXIETY 11/22/19  Yes Burnard Hawthorne, FNP  amLODipine (NORVASC) 10 MG tablet TAKE ONE TABLET BY MOUTH ONCE DAILY 01/08/19  Yes Burnard Hawthorne, FNP  buPROPion (WELLBUTRIN XL) 150 MG 24 hr tablet Take 2 tablets (300 mg total) by mouth every morning. 09/16/19  Yes Burnard Hawthorne, FNP  ferrous sulfate 325 (65 FE) MG tablet Take by mouth 2 (two) times daily with a meal.  02/08/14  Yes [provider]  fluticasone (FLONASE) 50 MCG/ACT nasal spray Place 2 sprays into both nostrils daily. 08/07/17  Yes Withrow, Elyse Jarvis, FNP  gabapentin (NEURONTIN) 300 MG capsule TAKE ONE CAPSULE BY MOUTH THREE TIMES DAILY & ONE CAPSULE AT BEDTIME 08/23/19  Yes Cammie Sickle, MD  sertraline (ZOLOFT) 100 MG tablet TAKE ONE TABLET BY MOUTH AT BEDTIME 08/23/19  Yes Burnard Hawthorne, FNP  sertraline (ZOLOFT) 50 MG tablet Take 1 tablet (50 mg total) by mouth at bedtime. 10/12/19  Yes Burnard Hawthorne, FNP    Allergies as of 10/25/2019 - Review Complete 10/12/2019  Allergen  Reaction Noted  . Lisinopril Hives and Rash 04/14/2012    Family History  Problem Relation Age of Onset  . Arthritis Mother   . Stroke Mother   . Hypertension Mother   . Diabetes Mother   . Arthritis Father   . Heart disease Father 80       s/p CABG  . Stroke Father   . Hypertension Father   . Alcohol abuse Maternal Grandmother   . Diabetes Maternal Grandmother   . Alcohol abuse Maternal Grandfather   . Diabetes Maternal Grandfather   . Cancer Paternal Grandmother        breast  . Heart disease Paternal Grandfather   . Breast cancer Paternal Aunt 82  . Colon cancer Neg Hx     Social History   Socioeconomic History   . Marital status: Married    Spouse name: Not on file  . Number of children: Not on file  . Years of education: Not on file  . Highest education level: Not on file  Occupational History  . Not on file  Tobacco Use  . Smoking status: Never Smoker  . Smokeless tobacco: Never Used  Substance and Sexual Activity  . Alcohol use: No    Comment: rare - holidays  . Drug use: No  . Sexual activity: Not on file  Other Topics Concern  . Not on file  Social History Narrative   Lives in [place] with husband, has 2 children, 1 in college at [employer].      One daughter with leukemia, in remission   Other daughter has DM I.          Work - Chiropractor   Diet - regular   Exercise - none recently   Social Determinants of Radio broadcast assistant Strain:   . Difficulty of Paying Living Expenses:   Food Insecurity:   . Worried About Charity fundraiser in the Last Year:   . Arboriculturist in the Last Year:   Transportation Needs:   . Film/video editor (Medical):   Marland Kitchen Lack of Transportation (Non-Medical):   Physical Activity:   . Days of Exercise per Week:   . Minutes of Exercise per Session:   Stress:   . Feeling of Stress :   Social Connections:   . Frequency of Communication with Friends and Family:   . Frequency of Social Gatherings with Friends and Family:   . Attends Religious Services:   . Active Member of Clubs or Organizations:   . Attends Archivist Meetings:   Marland Kitchen Marital Status:   Intimate Partner Violence:   . Fear of Current or Ex-Partner:   . Emotionally Abused:   Marland Kitchen Physically Abused:   . Sexually Abused:     Review of Systems: See HPI, otherwise negative ROS  Physical Exam: BP (!) 178/90   Pulse 91   Temp 98.1 F (36.7 C) (Temporal)   Resp 16   Ht 5\' 5"  (1.651 m)   Wt 113.9 kg   LMP 12/11/2013   SpO2 98%   BMI 41.77 kg/m  General:   Alert,  pleasant and cooperative in NAD Head:  Normocephalic and atraumatic. Neck:  Supple;  no masses or thyromegaly. Lungs:  Clear throughout to auscultation.    Heart:  Regular rate and rhythm. Abdomen:  Soft, nontender and nondistended. Normal bowel sounds, without guarding, and without rebound.   Neurologic:  Alert and  oriented x4;  grossly normal neurologically.  Impression/Plan: Tammie Robinson is here for an colonoscopy to be performed for history of adenomatous polys with la colonoscopy in 2017  Risks, benefits, limitations, and alternatives regarding  colonoscopy have been reviewed with the patient.  Questions have been answered.  All parties agreeable.   Lucilla Lame, MD  11/29/2019, 9:53 AM

## 2019-11-29 NOTE — Op Note (Signed)
[facility] Gastroenterology Patient Name: Tammie Robinson Procedure Date: 11/29/2019 10:20 AM MRN: [id number] Account #: [id number] Date of Birth: [birth year] Admit Type: Outpatient Age: 54 Room: [facility] OR ROOM 01 Gender: Female Note Status: Finalized Procedure:             Colonoscopy Indications:           High risk colon cancer surveillance: Personal history                         of colonic polyps, High risk colon cancer                         surveillance: Personal history of colon cancer Providers:             Lucilla Lame MD, MD Referring MD:          Yvetta Coder. Arnett (Referring MD) Medicines:             Propofol per Anesthesia Complications:         No immediate complications. Procedure:             Pre-Anesthesia Assessment:                        - Prior to the procedure, a History and Physical was                         performed, and patient medications and allergies were                         reviewed. The patient's tolerance of previous                         anesthesia was also reviewed. The risks and benefits                         of the procedure and the sedation options and risks                         were discussed with the patient. All questions were                         answered, and informed consent was obtained. Prior                         Anticoagulants: The patient has taken no previous                         anticoagulant or antiplatelet agents. ASA Grade                         Assessment: II - A patient with mild systemic disease.                         After reviewing the risks and benefits, the patient                         was deemed in satisfactory condition to undergo the  procedure.                        After obtaining informed consent, the colonoscope was                         passed under direct vision. Throughout the procedure,                         the patient's blood pressure,  pulse, and oxygen                         saturations were monitored continuously. The was                         introduced through the descending colostomy and                         advanced to the the cecum, identified by appendiceal                         orifice and ileocecal valve. The colonoscopy was                         performed without difficulty. The patient tolerated                         the procedure well. The quality of the bowel                         preparation was excellent. Findings:      The perianal and digital rectal examinations were normal.      A 5 mm polyp was found in the descending colon. The polyp was sessile.       The polyp was removed with a cold snare. Resection and retrieval were       complete. Impression:            - One 5 mm polyp in the descending colon, removed with                         a cold snare. Resected and retrieved. Recommendation:        - Discharge patient to home.                        - Resume previous diet.                        - Continue present medications.                        - Await pathology results.                        - Repeat colonoscopy in 5 years for surveillance. Procedure Code(s):     --- Professional ---                        [phone], Colonoscopy through stoma; with removal of  tumor(s), polyp(s), or other lesion(s) by snare                         technique Diagnosis Code(s):     --- Professional ---                        [id number], Personal history of other malignant neoplasm                         of large intestine                        Z86.010, Personal history of colonic polyps                        K63.5, Polyp of colon CPT copyright 2019 American Medical Association. All rights reserved. The codes documented in this report are preliminary and upon coder review may  be revised to meet current compliance requirements. Lucilla Lame MD, MD 11/29/2019 10:53:24 AM This  report has been signed electronically. Number of Addenda: 0 Note Initiated On: 11/29/2019 10:20 AM Scope Withdrawal Time: 0 hours 9 minutes 28 seconds  Total Procedure Duration: 0 hours 13 minutes 12 seconds  Estimated Blood Loss:  Estimated blood loss: none.      [facility]

## 2019-11-29 NOTE — Transfer of Care (Signed)
Immediate Anesthesia Transfer of Care Note  Patient: Tammie Robinson  Procedure(s) Performed: COLONOSCOPY WITH PROPOFOL (N/A Rectum) POLYPECTOMY (N/A Rectum)  Patient Location: PACU  Anesthesia Type: General  Level of Consciousness: awake, alert  and patient cooperative  Airway and Oxygen Therapy: Patient Spontanous Breathing and Patient connected to supplemental oxygen  Post-op Assessment: Post-op Vital signs reviewed, Patient's Cardiovascular Status Stable, Respiratory Function Stable, Patent Airway and No signs of Nausea or vomiting  Post-op Vital Signs: Reviewed and stable  Complications: No apparent anesthesia complications

## 2019-11-30 ENCOUNTER — Encounter: Payer: Self-pay | Admitting: *Deleted

## 2019-11-30 LAB — SURGICAL PATHOLOGY

## 2019-12-01 ENCOUNTER — Encounter: Payer: Self-pay | Admitting: Gastroenterology

## 2019-12-13 ENCOUNTER — Encounter: Payer: Self-pay | Admitting: Gastroenterology

## 2020-01-21 ENCOUNTER — Encounter: Payer: Self-pay | Admitting: Family

## 2020-01-21 ENCOUNTER — Other Ambulatory Visit: Payer: Self-pay

## 2020-01-21 DIAGNOSIS — F32A Depression, unspecified: Secondary | ICD-10-CM

## 2020-01-21 NOTE — Telephone Encounter (Signed)
Left a message to return call and schedule for an appointment with PCP

## 2020-01-25 MED ORDER — SERTRALINE HCL 100 MG PO TABS: 100.0000 mg | ORAL_TABLET | Freq: Every day | ORAL | 1 refills | Status: DC

## 2020-01-25 MED ORDER — SERTRALINE HCL 50 MG PO TABS: 50.0000 mg | ORAL_TABLET | Freq: Every day | ORAL | 3 refills | Status: DC

## 2020-01-25 MED ORDER — ALPRAZOLAM 0.25 MG PO TABS: 0.2500 mg | ORAL_TABLET | Freq: Two times a day (BID) | ORAL | 0 refills | Status: DC | PRN

## 2020-01-28 ENCOUNTER — Inpatient Hospital Stay (HOSPITAL_BASED_OUTPATIENT_CLINIC_OR_DEPARTMENT_OTHER): Payer: Self-pay | Admitting: Internal Medicine

## 2020-01-28 ENCOUNTER — Other Ambulatory Visit: Payer: Self-pay

## 2020-01-28 ENCOUNTER — Inpatient Hospital Stay: Payer: Self-pay | Attending: Internal Medicine

## 2020-01-28 DIAGNOSIS — Z79899 Other long term (current) drug therapy: Secondary | ICD-10-CM | POA: Insufficient documentation

## 2020-01-28 DIAGNOSIS — F4310 Post-traumatic stress disorder, unspecified: Secondary | ICD-10-CM | POA: Insufficient documentation

## 2020-01-28 DIAGNOSIS — C20 Malignant neoplasm of rectum: Secondary | ICD-10-CM

## 2020-01-28 DIAGNOSIS — I10 Essential (primary) hypertension: Secondary | ICD-10-CM | POA: Insufficient documentation

## 2020-01-28 DIAGNOSIS — G629 Polyneuropathy, unspecified: Secondary | ICD-10-CM | POA: Insufficient documentation

## 2020-01-28 DIAGNOSIS — Z9221 Personal history of antineoplastic chemotherapy: Secondary | ICD-10-CM | POA: Insufficient documentation

## 2020-01-28 DIAGNOSIS — Z923 Personal history of irradiation: Secondary | ICD-10-CM | POA: Insufficient documentation

## 2020-01-28 DIAGNOSIS — Z933 Colostomy status: Secondary | ICD-10-CM | POA: Insufficient documentation

## 2020-01-28 DIAGNOSIS — Z85048 Personal history of other malignant neoplasm of rectum, rectosigmoid junction, and anus: Secondary | ICD-10-CM | POA: Insufficient documentation

## 2020-01-28 LAB — COMPREHENSIVE METABOLIC PANEL
ALT: 44 U/L (ref 0–44)
AST: 32 U/L (ref 15–41)
Albumin: 4.3 g/dL (ref 3.5–5.0)
Alkaline Phosphatase: 92 U/L (ref 38–126)
Anion gap: 10 (ref 5–15)
BUN: 16 mg/dL (ref 6–20)
CO2: 25 mmol/L (ref 22–32)
Calcium: 8.8 mg/dL — ABNORMAL LOW (ref 8.9–10.3)
Chloride: 104 mmol/L (ref 98–111)
Creatinine, Ser: 1.12 mg/dL — ABNORMAL HIGH (ref 0.44–1.00)
GFR calc Af Amer: >60 mL/min (ref >60)
GFR calc non Af Amer: 56 mL/min — ABNORMAL LOW (ref >60)
Glucose, Bld: 109 mg/dL — ABNORMAL HIGH (ref 70–99)
Potassium: 3.8 mmol/L (ref 3.5–5.1)
Sodium: 139 mmol/L (ref 135–145)
Total Bilirubin: 0.6 mg/dL (ref 0.3–1.2)
Total Protein: 7.7 g/dL (ref 6.5–8.1)

## 2020-01-28 LAB — CBC WITH DIFFERENTIAL/PLATELET
Abs Immature Granulocytes: 0.03 10*3/uL (ref 0.00–0.07)
Basophils Absolute: 0.1 10*3/uL (ref 0.0–0.1)
Basophils Relative: 1 %
Eosinophils Absolute: 0.2 10*3/uL (ref 0.0–0.5)
Eosinophils Relative: 3 %
HCT: 40.5 % (ref 36.0–46.0)
Hemoglobin: 13.8 g/dL (ref 12.0–15.0)
Immature Granulocytes: 0 %
Lymphocytes Relative: 26 %
Lymphs Abs: 1.7 10*3/uL (ref 0.7–4.0)
MCH: 28.6 pg (ref 26.0–34.0)
MCHC: 34.1 g/dL (ref 30.0–36.0)
MCV: 83.9 fL (ref 80.0–100.0)
Monocytes Absolute: 0.5 10*3/uL (ref 0.1–1.0)
Monocytes Relative: 7 %
Neutro Abs: 4.2 10*3/uL (ref 1.7–7.7)
Neutrophils Relative %: 63 %
Platelets: 208 10*3/uL (ref 150–400)
RBC: 4.83 MIL/uL (ref 3.87–5.11)
RDW: 12.6 % (ref 11.5–15.5)
WBC: 6.7 10*3/uL (ref 4.0–10.5)
nRBC: 0 % (ref 0.0–0.2)

## 2020-01-28 NOTE — Progress Notes (Signed)
[place] OFFICE PROGRESS NOTE  Patient Care Team: Burnard Hawthorne, FNP as PCP - General (Family Medicine) Rico Junker, RN as Registered Nurse Theodore Demark, RN as Registered Nurse  Cancer Staging Rectal cancer [facility]) Staging form: Colon and Rectum, AJCC 7th Edition - Clinical stage from 11/21/2014: Stage Unknown ([place], N2, M0) - Signed by Leia Alf, MD on 11/21/2014    Oncology History Overview Note  # 2015- RECTAL CA STAGE III-  s/p neoadjuvant radiation and concurrent 5FU chemotherapy completed end of July/early Aug 2015.  (initially diagnosed on 12/22/13 - upper GI showed an ulcer and colonoscopy showed a large rectal mass at the most proximal extent 4 cm from the anus). Then had Abdominoperineal resection, radical hysterectomy, vaginectomy with removal of tubes/ovaries with rectus flap myocutaneous vaginal reconstruction at [facility] on 04/12/14. Surgical pathology reports residual invasive rectal adenocarcinoma low-grade, tumor invades muscularis propria, margins uninvolved, no lymphovascular or perineural invasion, 27 lymph nodes examined negative for metastasis (ypT2pN0). S/p FOLFOX adjuvant chemo  # # Complex liver lesion-PET negative in 2015.  Previously reviewed the tumor conference; again MRI of the liver today shows complex cyst; adjacent stable lesion-suspect benign etiology.  No further work-up or imaging is recommended at this time.  # Peripheral Neuropathy- G-2.   # June 2017- colo Dr.Wohl;   DIAGNOSIS: Colon cancer  STAGE:   III      ;GOALS: cure  CURRENT/MOST RECENT THERAPY : surveillaince     Rectal cancer ([place])  01/03/2014 Initial Diagnosis   Rectal cancer ([facility])       INTERVAL HISTORY:  Tammie Robinson 54 y.o.  female pleasant patient above history of stage III colon cancer is here for follow-up.  Status post recent colonoscopy May 2021.  Patient continues to admit to tingling and numbness in activities but denies  any worsening though.  No abdominal pain no nausea vomiting.  No blood in stools black or stools.  Review of Systems  Constitutional: Negative for chills, diaphoresis, fever, malaise/fatigue and weight loss.  HENT: Negative for nosebleeds and sore throat.   Eyes: Negative for double vision.  Respiratory: Negative for cough, hemoptysis, sputum production, shortness of breath and wheezing.   Cardiovascular: Negative for chest pain, palpitations, orthopnea and leg swelling.  Gastrointestinal: Negative for abdominal pain, blood in stool, constipation, diarrhea, heartburn, melena, nausea and vomiting.  Genitourinary: Negative for dysuria, frequency and urgency.  Musculoskeletal: Negative for back pain and joint pain.  Skin: Negative.  Negative for itching and rash.  Neurological: Positive for tingling. Negative for dizziness, focal weakness, weakness and headaches.  Endo/Heme/Allergies: Does not bruise/bleed easily.  Psychiatric/Behavioral: Negative for depression. The patient is not nervous/anxious and does not have insomnia.       PAST MEDICAL HISTORY :  Past Medical History:  Diagnosis Date  . Anemia    prior to CA dx  . Asthma    seasonal,never hospitalized  . Blood in stool   . Colostomy in place [facility])   . History of ERCP   . Hypertension   . Migraine   . 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 PTSD (post-traumatic stress disorder)   . Rectal cancer ([place])   . Systolic murmur   . Wears contact lenses     PAST SURGICAL HISTORY :   Past Surgical History:  Procedure Laterality Date  . ABDOMINAL HYSTERECTOMY  04/12/2014   [facility] with CA surgery; no cervix ( M.Arnett 01/2017)  .  CHOLECYSTECTOMY  2012   Dr. Jamal Collin  . COLONOSCOPY WITH PROPOFOL N/A 01/26/2016   Procedure: COLONOSCOPY WITH PROPOFOL through colostomy;  Surgeon: Lucilla Lame, MD;  Location: [place];  Service: Endoscopy;  Laterality: N/A;  port-a-cath  . COLONOSCOPY WITH PROPOFOL N/A 11/29/2019    Procedure: COLONOSCOPY WITH PROPOFOL;  Surgeon: Lucilla Lame, MD;  Location: [place];  Service: Endoscopy;  Laterality: N/A;  . POLYPECTOMY  01/26/2016   Procedure: POLYPECTOMY;  Surgeon: Lucilla Lame, MD;  Location: [place];  Service: Endoscopy;;  . POLYPECTOMY N/A 11/29/2019   Procedure: POLYPECTOMY;  Surgeon: Lucilla Lame, MD;  Location: [place];  Service: Endoscopy;  Laterality: N/A;  . PORTA CATH REMOVAL N/A 01/28/2018   Procedure: PORTA CATH REMOVAL;  Surgeon: Algernon Huxley, MD;  Location: [place] [facility];  Service: Cardiovascular;  Laterality: N/A;  . PORTACATH PLACEMENT    . PROCTECTOMY  04/12/14   [facility] - CA surgery - with colostomy  . SALPINGOOPHORECTOMY Bilateral 04/12/14   [facility] - with CA surg  . VAGINA RECONSTRUCTION SURGERY  04/12/14   [facility] - transabdonimal rectus abdominus muscle reconstruction  . VAGINAL DELIVERY     2, preterm labor    FAMILY HISTORY :   Family History  Problem Relation Age of Onset  . Arthritis Mother   . Stroke Mother   . Hypertension Mother   . Diabetes Mother   . Arthritis Father   . Heart disease Father 74       s/p CABG  . Stroke Father   . Hypertension Father   . Alcohol abuse Maternal Grandmother   . Diabetes Maternal Grandmother   . Alcohol abuse Maternal Grandfather   . Diabetes Maternal Grandfather   . Cancer Paternal Grandmother        breast  . Heart disease Paternal Grandfather   . Breast cancer Paternal Aunt 51  . Colon cancer Neg Hx     SOCIAL HISTORY:   Social History   Tobacco Use  . Smoking status: Never Smoker  . Smokeless tobacco: Never Used  Vaping Use  . Vaping Use: Never used  Substance Use Topics  . Alcohol use: No    Comment: rare - holidays  . Drug use: No    ALLERGIES:  is allergic to lisinopril.  MEDICATIONS:  Current Outpatient Medications  Medication Sig Dispense Refill  . acetaminophen (TYLENOL) 325 MG tablet Take 325 mg by mouth every 6 (six) hours as needed for  mild pain.     Marland Kitchen albuterol (PROVENTIL HFA;VENTOLIN HFA) 108 (90 Base) MCG/ACT inhaler Inhale 1-2 puffs into the lungs every 4 (four) hours as needed for wheezing or shortness of breath. 1 Inhaler 3  . ALPRAZolam (XANAX) 0.25 MG tablet Take 1 tablet (0.25 mg total) by mouth 2 (two) times daily as needed. for anxiety 60 tablet 0  . amLODipine (NORVASC) 10 MG tablet TAKE ONE TABLET BY MOUTH ONCE DAILY 90 tablet 4  . buPROPion (WELLBUTRIN XL) 150 MG 24 hr tablet Take 2 tablets (300 mg total) by mouth every morning. 60 tablet 3  . ferrous sulfate 325 (65 FE) MG tablet Take by mouth 2 (two) times daily with a meal.     . fluticasone (FLONASE) 50 MCG/ACT nasal spray Place 2 sprays into both nostrils daily. 16 g 1  . gabapentin (NEURONTIN) 300 MG capsule TAKE ONE CAPSULE BY MOUTH THREE TIMES DAILY & ONE CAPSULE AT BEDTIME 120 capsule 5  . sertraline (ZOLOFT) 100 MG tablet  Take 1 tablet (100 mg total) by mouth at bedtime. 90 tablet 1  . sertraline (ZOLOFT) 50 MG tablet Take 1 tablet (50 mg total) by mouth at bedtime. 90 tablet 3   No current facility-administered medications for this visit.   Facility-Administered Medications Ordered in Other Visits  Medication Dose Route Frequency Provider Last Rate Last Admin  . heparin lock flush 100 unit/mL  500 Units Intravenous Once Leia Alf, MD      . sodium chloride 0.9 % injection 10 mL  10 mL Intravenous PRN Ma Hillock, Sandeep, MD      . sodium chloride 0.9 % injection 10 mL  10 mL Intravenous PRN Leia Alf, MD   10 mL at 11/28/14 1120  . sodium chloride flush (NS) 0.9 % injection 10 mL  10 mL Intravenous PRN Evlyn Kanner, NP   10 mL at 01/03/16 1413  . sodium chloride flush (NS) 0.9 % injection 10 mL  10 mL Intravenous PRN Lloyd Huger, MD   10 mL at 11/15/16 1111  . sodium chloride flush (NS) 0.9 % injection 10 mL  10 mL Intravenous PRN Cammie Sickle, MD   10 mL at 11/24/17 1434    PHYSICAL EXAMINATION: ECOG PERFORMANCE  STATUS: 1 - Symptomatic but completely ambulatory  BP 140/77 (BP Location: Left Arm, Patient Position: Sitting, Cuff Size: Large)   Pulse 72   Temp 99.4 F (37.4 C) (Tympanic)   Wt 226 lb (102.5 kg)   LMP 12/11/2013   BMI 37.61 kg/m   Filed Weights   01/28/20 1349  Weight: 226 lb (102.5 kg)    Physical Exam Constitutional:      Comments: Obese.  She is alone.  HENT:     Head: Normocephalic and atraumatic.     Mouth/Throat:     Pharynx: No oropharyngeal exudate.  Eyes:     Pupils: Pupils are equal, round, and reactive to light.  Cardiovascular:     Rate and Rhythm: Normal rate and regular rhythm.  Pulmonary:     Effort: Pulmonary effort is normal. No respiratory distress.     Breath sounds: Normal breath sounds. No wheezing.  Abdominal:     General: Bowel sounds are normal. There is no distension.     Palpations: Abdomen is soft. There is no mass.     Tenderness: There is no abdominal tenderness. There is no guarding or rebound.     Comments: Colostomy pouch in place.  Musculoskeletal:        General: No tenderness. Normal range of motion.     Cervical back: Normal range of motion and neck supple.  Skin:    General: Skin is warm.  Neurological:     Mental Status: She is alert and oriented to person, place, and time.  Psychiatric:        Mood and Affect: Affect normal.        LABORATORY DATA:  I have reviewed the data as listed    Component Value Date/Time   NA 139 01/28/2020 1302   NA 144 08/29/2014 0908   K 3.8 01/28/2020 1302   K 3.3 (L) 08/29/2014 0908   CL 104 01/28/2020 1302   CL 107 08/29/2014 0908   CO2 25 01/28/2020 1302   CO2 29 08/29/2014 0908   GLUCOSE 109 (H) 01/28/2020 1302   GLUCOSE 88 08/29/2014 0908   BUN 16 01/28/2020 1302   BUN 18 08/29/2014 0908   CREATININE 1.12 (H) 01/28/2020 1302   CREATININE 0.52 11/07/2014  6073   CALCIUM 8.8 (L) 01/28/2020 1302   CALCIUM 8.4 (L) 08/29/2014 0908   PROT 7.7 01/28/2020 1302   PROT 6.8  11/07/2014 0902   ALBUMIN 4.3 01/28/2020 1302   ALBUMIN 3.5 11/07/2014 0902   AST 32 01/28/2020 1302   AST 41 11/07/2014 0902   ALT 44 01/28/2020 1302   ALT 31 11/07/2014 0902   ALKPHOS 92 01/28/2020 1302   ALKPHOS 137 (H) 11/07/2014 0902   BILITOT 0.6 01/28/2020 1302   BILITOT 0.8 11/07/2014 0902   GFRNONAA 56 (L) 01/28/2020 1302   GFRNONAA >60 11/07/2014 0902   GFRAA >60 01/28/2020 1302   GFRAA >60 11/07/2014 0902    No results found for: SPEP, UPEP  Lab Results  Component Value Date   WBC 6.7 01/28/2020   NEUTROABS 4.2 01/28/2020   HGB 13.8 01/28/2020   HCT 40.5 01/28/2020   MCV 83.9 01/28/2020   PLT 208 01/28/2020      Chemistry      Component Value Date/Time   NA 139 01/28/2020 1302   NA 144 08/29/2014 0908   K 3.8 01/28/2020 1302   K 3.3 (L) 08/29/2014 0908   CL 104 01/28/2020 1302   CL 107 08/29/2014 0908   CO2 25 01/28/2020 1302   CO2 29 08/29/2014 0908   BUN 16 01/28/2020 1302   BUN 18 08/29/2014 0908   CREATININE 1.12 (H) 01/28/2020 1302   CREATININE 0.52 11/07/2014 0902      Component Value Date/Time   CALCIUM 8.8 (L) 01/28/2020 1302   CALCIUM 8.4 (L) 08/29/2014 0908   ALKPHOS 92 01/28/2020 1302   ALKPHOS 137 (H) 11/07/2014 0902   AST 32 01/28/2020 1302   AST 41 11/07/2014 0902   ALT 44 01/28/2020 1302   ALT 31 11/07/2014 0902   BILITOT 0.6 01/28/2020 1302   BILITOT 0.8 11/07/2014 0902       RADIOGRAPHIC STUDIES: I have personally reviewed the radiological images as listed and agreed with the findings in the report. No results found.   ASSESSMENT & PLAN:  Rectal cancer ([place]) # Rectal cancer, clinical TXN2M0, at least stage III [2015].  Clinically no evidence of recurrence-see discussion on liver lesions [below]. STABLE. Colo May 2021- WNL; 5 mm adenoma; repeat colo- in 2026.  In general recommend weight loss given the history of colon cancer; address risk of obesity.  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  Continue neurontin [300 mg 3 times a day; one extra  pill at nighttime; recommend trial of acupuncture.   # Hypocalcemia-calcium 8.8 recommend vit D 1000  # slightly elevated creatnine-GFR 50s overall stable-again recommend control blood pressure/weight loss.  DISPOSITION:  #Follow-up in 1 year- MD; labs- CBC CMP CEA.- Dr.B   Orders Placed This Encounter  Procedures  . Comprehensive metabolic panel    Standing Status:   Future    Standing Expiration Date:   01/27/2021  . CBC with Differential    Standing Status:   Future    Standing Expiration Date:   01/27/2021  . CEA    Standing Status:   Future    Standing Expiration Date:   01/27/2021   All questions were answered. The patient knows to call the clinic with any problems, questions or concerns.      Cammie Sickle, MD 01/28/2020 2:21 PM

## 2020-01-28 NOTE — Assessment & Plan Note (Addendum)
#   Rectal cancer, clinical TXN2M0, at least stage III [2015].  Clinically no evidence of recurrence-see discussion on liver lesions [below]. STABLE. Colo May 2021- WNL; 5 mm adenoma; repeat colo- in 2026.  In general recommend weight loss given the history of colon cancer; address risk of obesity.  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  Continue neurontin [300 mg 3 times a day; one extra pill at nighttime; recommend trial of acupuncture.   # Hypocalcemia-calcium 8.8 recommend vit D 1000  # slightly elevated creatnine-GFR 50s overall stable-again recommend control blood pressure/weight loss.  DISPOSITION:  #Follow-up in 1 year- MD; labs- CBC CMP CEA.- Dr.B

## 2020-01-29 LAB — CEA: CEA: 3.6 ng/mL (ref 0.0–4.7)

## 2020-02-10 ENCOUNTER — Other Ambulatory Visit: Payer: Self-pay | Admitting: Internal Medicine

## 2020-02-10 ENCOUNTER — Other Ambulatory Visit: Payer: Self-pay | Admitting: Family

## 2020-02-10 DIAGNOSIS — C20 Malignant neoplasm of rectum: Secondary | ICD-10-CM

## 2020-02-10 DIAGNOSIS — F419 Anxiety disorder, unspecified: Secondary | ICD-10-CM

## 2020-02-11 ENCOUNTER — Other Ambulatory Visit: Payer: Self-pay | Admitting: Family

## 2020-02-11 DIAGNOSIS — I10 Essential (primary) hypertension: Secondary | ICD-10-CM

## 2020-02-24 ENCOUNTER — Encounter: Payer: Self-pay | Admitting: Family

## 2020-02-25 ENCOUNTER — Telehealth: Payer: Self-pay | Admitting: Family

## 2020-02-25 DIAGNOSIS — F419 Anxiety disorder, unspecified: Secondary | ICD-10-CM

## 2020-02-25 MED ORDER — ALPRAZOLAM 0.25 MG PO TABS: 0.2500 mg | ORAL_TABLET | Freq: Two times a day (BID) | ORAL | 0 refills | Status: DC | PRN

## 2020-02-25 NOTE — Telephone Encounter (Signed)
Refilled xanax See mychart note  I looked up patient on [place] Controlled Substances Reporting System PMP AWARE and saw no activity that raised concern of inappropriate use.

## 2020-04-11 ENCOUNTER — Other Ambulatory Visit: Payer: Self-pay | Admitting: Family

## 2020-04-11 DIAGNOSIS — F32A Depression, unspecified: Secondary | ICD-10-CM

## 2020-04-13 ENCOUNTER — Telehealth: Payer: Self-pay | Admitting: Family

## 2020-04-13 NOTE — Telephone Encounter (Signed)
LMTCB to schedule an appointment

## 2020-04-13 NOTE — Telephone Encounter (Signed)
Call pt I cannot refill xanax until she has appt to be seen  I sent her mychart message stating this in August It is policy I will have to decrease her xanax and plan to wean off if she cannot make follow up every 3 months for a controlled substance.   Please speak with her first and then I will decide if I wean her down prior to follow up or refill Route this note back to me

## 2020-04-14 ENCOUNTER — Other Ambulatory Visit: Payer: Self-pay | Admitting: Family

## 2020-04-14 NOTE — Telephone Encounter (Signed)
Call pt So far we have been unable to reach  She needs to have more regular follow up for controlled substance I have decreased xanax to one tablet daily  She may make a follow up and we can discuss if she would like.

## 2020-04-18 NOTE — Telephone Encounter (Signed)
LMTCB

## 2020-04-18 NOTE — Telephone Encounter (Signed)
Mychart message also sent to patient to call to make f/u appointment for med refills.

## 2020-05-11 ENCOUNTER — Other Ambulatory Visit: Payer: Self-pay | Admitting: Family

## 2020-05-11 DIAGNOSIS — F419 Anxiety disorder, unspecified: Secondary | ICD-10-CM

## 2020-05-12 ENCOUNTER — Encounter: Payer: Self-pay | Admitting: Family

## 2020-05-12 ENCOUNTER — Other Ambulatory Visit: Payer: Self-pay | Admitting: Family

## 2020-05-12 NOTE — Telephone Encounter (Signed)
Last OV 3/21 and last fill 04/14/20

## 2020-07-12 ENCOUNTER — Other Ambulatory Visit: Payer: Self-pay | Admitting: Internal Medicine

## 2020-07-12 DIAGNOSIS — C20 Malignant neoplasm of rectum: Secondary | ICD-10-CM

## 2020-07-12 DIAGNOSIS — F32A Depression, unspecified: Secondary | ICD-10-CM

## 2020-07-20 ENCOUNTER — Telehealth: Payer: Self-pay | Admitting: Physician Assistant

## 2020-07-20 DIAGNOSIS — J329 Chronic sinusitis, unspecified: Secondary | ICD-10-CM

## 2020-07-20 NOTE — Progress Notes (Signed)
E-prescribing for Augmentin is not working. Medication phoned in to preferred pharmacy.

## 2020-07-20 NOTE — Progress Notes (Signed)

## 2020-07-22 DIAGNOSIS — Z8639 Personal history of other endocrine, nutritional and metabolic disease: Secondary | ICD-10-CM

## 2020-07-22 HISTORY — DX: Personal history of other endocrine, nutritional and metabolic disease: Z86.39

## 2020-08-10 ENCOUNTER — Other Ambulatory Visit: Payer: Self-pay | Admitting: Family

## 2020-08-10 DIAGNOSIS — F32A Depression, unspecified: Secondary | ICD-10-CM

## 2020-08-10 DIAGNOSIS — F419 Anxiety disorder, unspecified: Secondary | ICD-10-CM

## 2020-08-18 ENCOUNTER — Telehealth: Payer: Self-pay | Admitting: Nurse Practitioner

## 2020-08-18 DIAGNOSIS — R0981 Nasal congestion: Secondary | ICD-10-CM

## 2020-08-18 DIAGNOSIS — Z20822 Contact with and (suspected) exposure to covid-19: Secondary | ICD-10-CM

## 2020-08-18 DIAGNOSIS — R519 Headache, unspecified: Secondary | ICD-10-CM

## 2020-08-18 NOTE — Progress Notes (Signed)
E-Visit for Corona Virus Screening  Your current symptoms could be consistent with the coronavirus.  Many health care providers can now test patients at their office but not all are.  [facility] has multiple testing sites. For information on our COVID testing locations and hours go to [email or web address]  You will need to be covid tested for proper treatment. Also you had a sinus infection right at 1 month ago and will need  To see your PCP for any antibiotic treatment.  Testing Information: The COVID-19 Community Testing sites are testing BY APPOINTMENT ONLY.  You can schedule online at [email or web address]  If you do not have access to a smart phone or computer you may call [phone] for an appointment.   Additional testing sites in the Community:  . For [facility] Testing sites in [facility]  FaceUpdate.uy  . For Pop-up testing sites in [place]  BowlDirectory.co.uk  . For [facility] [email or web address]  . For [facility] testing in [place] [employer] [email or web address]  . For [facility] testing in [facility]   [email or web address]  For  more information about community testing call [phone]   Please quarantine yourself while awaiting your test results. Please stay home for a minimum of 10 days from the first day of illness with improving symptoms and you have had 24 hours of no fever (without the use of Tylenol (Acetaminophen) Motrin (Ibuprofen) or any fever reducing medication).  Also - Do not get tested prior to returning to work because once you have had a positive test the test can stay positive for more than a month in  some cases.   You should wear a mask or cloth face covering over your nose and mouth if you must be around other people or animals, including pets (even at home). Try to stay at least 6 feet away from other people. This will protect the people around you.  Please continue good preventive care measures, including:  frequent hand-washing, avoid touching your face, cover coughs/sneezes, stay out of crowds and keep a 6 foot distance from others.  COVID-19 is a respiratory illness with symptoms that are similar to the flu. Symptoms are typically mild to moderate, but there have been cases of severe illness and death due to the virus.   The following symptoms may appear 2-14 days after exposure: . Fever . Cough . Shortness of breath or difficulty breathing . Chills . Repeated shaking with chills . Muscle pain . Headache . Sore throat . New loss of taste or smell . FatigueCongestion or runny nose . Nausea or vomiting . Diarrhea  Go to the nearest hospital ED for assessment if fever/cough/breathlessness are severe or illness seems like a threat to life.  It is vitally important that if you feel that you have an infection such as this virus or any other virus that you stay home and away from places where you may spread it to others.  You should avoid contact with people age 69 and older.    You may also take acetaminophen (Tylenol) as needed for fever.  Reduce your risk of any infection by using the same precautions used for avoiding the common cold or flu:  Marland Kitchen Wash your hands often with soap and warm water for at least 20 seconds.  If soap and water are not readily available, use an alcohol-based hand sanitizer with at least 60% alcohol.  . If coughing or sneezing, cover your mouth and nose by coughing or sneezing into the  elbow areas of your shirt or coat, into a tissue or into your sleeve (not your hands). . Avoid shaking hands with others and consider head nods or verbal greetings only. . Avoid  touching your eyes, nose, or mouth with unwashed hands.  . Avoid close contact with people who are sick. . Avoid places or events with large numbers of people in one location, like concerts or sporting events. . Carefully consider travel plans you have or are making. . If you are planning any travel outside or inside the [place], visit the CDC's Travelers' Health webpage for the latest health notices. . If you have some symptoms but not all symptoms, continue to monitor at home and seek medical attention if your symptoms worsen. . If you are having a medical emergency, call 911.  HOME CARE . Only take medications as instructed by your medical team. . Drink plenty of fluids and get plenty of rest. . A steam or ultrasonic humidifier can help if you have congestion.   GET HELP RIGHT AWAY IF YOU HAVE EMERGENCY WARNING SIGNS** FOR COVID-19. If you or someone is showing any of these signs seek emergency medical care immediately. Call 911 or proceed to your closest emergency facility if: . You develop worsening high fever. . Trouble breathing . Bluish lips or face . Persistent pain or pressure in the chest . New confusion . Inability to wake or stay awake . You cough up blood. . Your symptoms become more severe  **This list is not all possible symptoms. Contact your medical provider for any symptoms that are sever or concerning to you.  MAKE SURE YOU   Understand these instructions.  Will watch your condition.  Will get help right away if you are not doing well or get worse.  Your e-visit answers were reviewed by a board certified advanced clinical practitioner to complete your personal care plan.  Depending on the condition, your plan could have included both over the counter or prescription medications.  If there is a problem please reply once you have received a response from your provider.  Your safety is important to [place].  If you have drug allergies check your prescription carefully.     You can use MyChart to ask questions about today's visit, request a non-urgent call back, or ask for a work or school excuse for 24 hours related to this e-Visit. If it has been greater than 24 hours you will need to follow up with your provider, or enter a new e-Visit to address those concerns. You will get an e-mail in the next two days asking about your experience.  I hope that your e-visit has been valuable and will speed your recovery. Thank you for using e-visits.   5-10 minutes spent reviewing and documenting in chart.

## 2020-09-12 ENCOUNTER — Other Ambulatory Visit: Payer: Self-pay | Admitting: Internal Medicine

## 2020-09-12 ENCOUNTER — Other Ambulatory Visit: Payer: Self-pay | Admitting: Family

## 2020-09-12 DIAGNOSIS — F419 Anxiety disorder, unspecified: Secondary | ICD-10-CM

## 2020-09-12 DIAGNOSIS — F32A Depression, unspecified: Secondary | ICD-10-CM

## 2020-11-13 ENCOUNTER — Other Ambulatory Visit: Payer: Self-pay | Admitting: Internal Medicine

## 2020-11-13 DIAGNOSIS — F419 Anxiety disorder, unspecified: Secondary | ICD-10-CM

## 2020-12-13 ENCOUNTER — Other Ambulatory Visit: Payer: Self-pay | Admitting: Family

## 2020-12-13 DIAGNOSIS — F419 Anxiety disorder, unspecified: Secondary | ICD-10-CM

## 2021-01-13 ENCOUNTER — Other Ambulatory Visit: Payer: Self-pay | Admitting: Family

## 2021-01-13 ENCOUNTER — Other Ambulatory Visit: Payer: Self-pay | Admitting: Internal Medicine

## 2021-01-13 DIAGNOSIS — C20 Malignant neoplasm of rectum: Secondary | ICD-10-CM

## 2021-01-13 DIAGNOSIS — F32A Depression, unspecified: Secondary | ICD-10-CM

## 2021-01-15 ENCOUNTER — Telehealth: Payer: Self-pay | Admitting: Family

## 2021-01-15 NOTE — Telephone Encounter (Signed)
Call pt Sch f/u Explain that she has to be seen twice per year for me to refill prescriptions for HTN and depression annually  Please refill zoloft for one month

## 2021-01-15 NOTE — Telephone Encounter (Signed)
Refilled: 11/13/2020 Last OV:  10/12/2019 Next OV: not scheduled

## 2021-01-15 NOTE — Telephone Encounter (Signed)
See telephone encounter.

## 2021-01-15 NOTE — Telephone Encounter (Signed)
Call out to pt

## 2021-01-15 NOTE — Telephone Encounter (Signed)
LMTCB

## 2021-01-16 NOTE — Telephone Encounter (Signed)
LMTCB

## 2021-01-17 NOTE — Telephone Encounter (Signed)
Gave pt info below, she stated that [facility] is considered a specialist with her insurance and wanted to switch to Dr. Olivia Mackie. I told her that Dr. Olivia Mackie isn't taking any new patients. She would need to contact her insurance company to fix that. Patient stated that she has already contacted her insurance company and they will not fix it. Patient stated that she may call back to schedule a follow up

## 2021-01-17 NOTE — Telephone Encounter (Signed)
I advised patient to call back & ask insurance company to list Dr. Derrel Nip as physician since that is who Joycelyn Schmid works under. Pt stated that she would call them & call back if any issues. I advised too, that she needed to make follow-up with Joycelyn Schmid when she called back as well.

## 2021-01-22 ENCOUNTER — Encounter: Payer: Self-pay | Admitting: Internal Medicine

## 2021-01-23 ENCOUNTER — Encounter: Payer: Self-pay | Admitting: Family

## 2021-01-25 ENCOUNTER — Telehealth: Payer: Self-pay | Admitting: Physician Assistant

## 2021-01-25 DIAGNOSIS — U071 COVID-19: Secondary | ICD-10-CM

## 2021-01-25 MED ORDER — FLUTICASONE PROPIONATE 50 MCG/ACT NA SUSP: 2.0000 | Freq: Every day | NASAL | 0 refills | Status: DC

## 2021-01-25 MED ORDER — BENZONATATE 100 MG PO CAPS: 100.0000 mg | ORAL_CAPSULE | Freq: Three times a day (TID) | ORAL | 0 refills | Status: DC | PRN

## 2021-01-25 NOTE — Progress Notes (Signed)
  E-Visit  for Positive Covid Test Result  We are sorry you are not feeling well. We are here to help!  You have tested positive for COVID-19, meaning that you were infected with the novel coronavirus and could give the virus to others.  It is vitally important that you stay home so you do not spread it to others.      Please continue isolation at home, for at least 10 days since the start of your symptoms and until you have had 24 hours with no fever (without taking a fever reducer) and with improving of symptoms.  If you have no symptoms but tested positive (or all symptoms resolve after 5 days and you have no fever) you can leave your house but continue to wear a mask around others for an additional 5 days. If you have a fever,continue to stay home until you have had 24 hours of no fever. Most cases improve 5-10 days from onset but we have seen a small number of patients who have gotten worse after the 10 days.  Please be sure to watch for worsening symptoms and remain taking the proper precautions.   Go to the nearest hospital ED for assessment if fever/cough/breathlessness are severe or illness seems like a threat to life.    The following symptoms may appear 2-14 days after exposure: Fever Cough Shortness of breath or difficulty breathing Chills Repeated shaking with chills Muscle pain Headache Sore throat New loss of taste or smell Fatigue Congestion or runny nose Nausea or vomiting Diarrhea  You have been enrolled in MyChart Home Monitoring for COVID-19. Daily you will receive a questionnaire within the MyChart website. Our COVID-19 response team will be monitoring your responses daily.  You can use medication such as prescription cough medication called Tessalon Perles 100 mg. You may take 1-2 capsules every 8 hours as needed for cough and prescription for Fluticasone nasal spray 2 sprays in each nostril one time per day  You may also take acetaminophen (Tylenol) as needed  for fever.  HOME CARE: Only take medications as instructed by your medical team. Drink plenty of fluids and get plenty of rest. A steam or ultrasonic humidifier can help if you have congestion.   GET HELP RIGHT AWAY IF YOU HAVE EMERGENCY WARNING SIGNS.  Call 911 or proceed to your closest emergency facility if: You develop worsening high fever. Trouble breathing Bluish lips or face Persistent pain or pressure in the chest New confusion Inability to wake or stay awake You cough up blood. Your symptoms become more severe Inability to hold down food or fluids  This list is not all possible symptoms. Contact your medical provider for any symptoms that are severe or concerning to you.    Your e-visit answers were reviewed by a board certified advanced clinical practitioner to complete your personal care plan.  Depending on the condition, your plan could have included both over the counter or prescription medications.  If there is a problem please reply once you have received a response from your provider.  Your safety is important to us.  If you have drug allergies check your prescription carefully.    You can use MyChart to ask questions about today's visit, request a non-urgent call back, or ask for a work or school excuse for 24 hours related to this e-Visit. If it has been greater than 24 hours you will need to follow up with your provider, or enter a new e-Visit to address those   concerns. You will get an e-mail in the next two days asking about your experience.  I hope that your e-visit has been valuable and will speed your recovery. Thank you for using e-visits.

## 2021-01-25 NOTE — Progress Notes (Signed)
I have spent 5 minutes in review of e-visit questionnaire, review and updating patient chart, medical decision making and response to patient.   Micholas Drumwright Cody Odessie Polzin, PA-C    

## 2021-01-29 ENCOUNTER — Inpatient Hospital Stay: Payer: Self-pay

## 2021-01-29 ENCOUNTER — Inpatient Hospital Stay: Payer: Self-pay | Admitting: Internal Medicine

## 2021-01-30 ENCOUNTER — Telehealth (INDEPENDENT_AMBULATORY_CARE_PROVIDER_SITE_OTHER): Payer: Self-pay | Admitting: Family

## 2021-01-30 ENCOUNTER — Encounter: Payer: Self-pay | Admitting: Family

## 2021-01-30 DIAGNOSIS — R059 Cough, unspecified: Secondary | ICD-10-CM

## 2021-01-30 DIAGNOSIS — U071 COVID-19: Secondary | ICD-10-CM

## 2021-01-30 DIAGNOSIS — R5383 Other fatigue: Secondary | ICD-10-CM

## 2021-01-30 MED ORDER — PROMETHAZINE-DM 6.25-15 MG/5ML PO SYRP
5.0000 mL | ORAL_SOLUTION | Freq: Four times a day (QID) | ORAL | 0 refills | Status: DC | PRN
Start: 2021-01-30 — End: 2021-02-19

## 2021-01-30 MED ORDER — AZITHROMYCIN 250 MG PO TABS
250.0000 mg | ORAL_TABLET | Freq: Every day | ORAL | 0 refills | Status: DC
Start: 2021-01-30 — End: 2021-02-19

## 2021-01-30 NOTE — Progress Notes (Signed)
Virtual Visit via Video   I connected with patient on 01/30/21 at  2:15 PM EDT by a video enabled telemedicine application and verified that I am speaking with the correct person using two identifiers.  Location patient: Home Location provider: [employer], Office Persons participating in the virtual visit: Patient, Provider, CMA  I discussed the limitations of evaluation and management by telemedicine and the availability of in person appointments. The patient expressed understanding and agreed to proceed.  Subjective:   HPI:   55 year old female is in today via video visit with concerns of a persistent cough after a bout with COVID-19. Cough is productive with green-yellow phlegm.  Has been taking  OTC med w/o resolution.    ROS:   See pertinent positives and negatives per HPI.  Patient Active Problem List   Diagnosis Date Noted   Personal history of colonic polyps    Polyp of descending colon    Hair loss 01/20/2019   Cellulitis 08/17/2018   Murmur 08/17/2018   Obesity (BMI 35.0-39.9 without comorbidity) 07/29/2018   Hypocalcemia 01/27/2017   Knee pain 07/29/2016   Benign neoplasm of cecum    Personal history of colon cancer    Rectal cancer ([place]) 01/03/2014   Anemia 01/03/2014   Unsatisfactory cervical Papanicolaou smear 06/25/2012   General medical exam 05/26/2012   Contraception 05/26/2012   Hypertension 04/14/2012   Anxiety and depression 04/14/2012   Insomnia 04/14/2012   Screening for breast cancer 04/14/2012    Social History   Tobacco Use   Smoking status: Never   Smokeless tobacco: Never  Substance Use Topics   Alcohol use: No    Comment: rare - holidays    Current Outpatient Medications:    albuterol (PROVENTIL HFA;VENTOLIN HFA) 108 (90 Base) MCG/ACT inhaler, Inhale 1-2 puffs into the lungs every 4 (four) hours as needed for wheezing or shortness of breath., Disp: 1 Inhaler, Rfl: 3   amLODipine (NORVASC) 10 MG tablet, TAKE ONE TABLET BY  MOUTH ONCE DAILY, Disp: 90 tablet, Rfl: 4   azithromycin (ZITHROMAX Z-PAK) 250 MG tablet, Take 1 tablet (250 mg total) by mouth daily. 2 tabs today, then 1 tab daly x 4 more days., Disp: 1 tablet, Rfl: 0   benzonatate (TESSALON) 100 MG capsule, Take 1 capsule (100 mg total) by mouth 3 (three) times daily as needed for cough., Disp: 30 capsule, Rfl: 0   buPROPion (WELLBUTRIN XL) 150 MG 24 hr tablet, TAKE TWO TABLETS BY MOUTH EVERY MORNING, Disp: 60 tablet, Rfl: 3   fluticasone (FLONASE) 50 MCG/ACT nasal spray, Place 2 sprays into both nostrils daily., Disp: 16 g, Rfl: 0   gabapentin (NEURONTIN) 300 MG capsule, TAKE ONE CAPSULE BY MOUTH THREE TIMES DAILY & ONE CAPSULE AT BEDTIME, Disp: 120 capsule, Rfl: 5   promethazine-dextromethorphan (PROMETHAZINE-DM) 6.25-15 MG/5ML syrup, Take 5 mLs by mouth 4 (four) times daily as needed for cough., Disp: 118 mL, Rfl: 0   sertraline (ZOLOFT) 100 MG tablet, TAKE ONE TABLET BY MOUTH AT BEDTIME, Disp: 30 tablet, Rfl: 1   sertraline (ZOLOFT) 50 MG tablet, TAKE ONE TABLET BY MOUTH AT BEDTIME, Disp: 90 tablet, Rfl: 3   acetaminophen (TYLENOL) 325 MG tablet, Take 325 mg by mouth every 6 (six) hours as needed for mild pain. , Disp: , Rfl:    ferrous sulfate 325 (65 FE) MG tablet, Take by mouth 2 (two) times daily with a meal. , Disp: , Rfl:  No current facility-administered medications for this visit.  Facility-Administered Medications Ordered in Other Visits:    heparin lock flush 100 unit/mL, 500 Units, Intravenous, Once, Pandit, Sandeep, MD   sodium chloride 0.9 % injection 10 mL, 10 mL, Intravenous, PRN, Ma Hillock, Sandeep, MD   sodium chloride 0.9 % injection 10 mL, 10 mL, Intravenous, PRN, Ma Hillock, Sandeep, MD, 10 mL at 11/28/14 1120   sodium chloride flush (NS) 0.9 % injection 10 mL, 10 mL, Intravenous, PRN, Georgeanne Nim F, NP, 10 mL at 01/03/16 1413   sodium chloride flush (NS) 0.9 % injection 10 mL, 10 mL, Intravenous, PRN, Lloyd Huger, MD, 10 mL at  11/15/16 1111   sodium chloride flush (NS) 0.9 % injection 10 mL, 10 mL, Intravenous, PRN, Charlaine Dalton R, MD, 10 mL at 11/24/17 1434  Allergies  Allergen Reactions   Lisinopril Hives and Rash    Objective:   LMP 12/11/2013   Patient is well-developed, well-nourished in no acute distress.  Resting comfortably at home.  Head is normocephalic, atraumatic.  No labored breathing.  Speech is clear and coherent with logical content.  Patient is alert and oriented at baseline.    Assessment and Plan:    Dewanda was seen today for covid positive.  Diagnoses and all orders for this visit:  COVID-19  Cough  Fatigue, unspecified type  Other orders -     promethazine-dextromethorphan (PROMETHAZINE-DM) 6.25-15 MG/5ML syrup; Take 5 mLs by mouth 4 (four) times daily as needed for cough. -     azithromycin (ZITHROMAX Z-PAK) 250 MG tablet; Take 1 tablet (250 mg total) by mouth daily. 2 tabs today, then 1 tab daly x 4 more days.    Call the office if symptoms worsen or persist. Recheck as scheduled and sooner as needed.  Kennyth Arnold, FNP 01/30/2021

## 2021-02-15 ENCOUNTER — Other Ambulatory Visit: Payer: Self-pay | Admitting: Family

## 2021-02-15 DIAGNOSIS — I10 Essential (primary) hypertension: Secondary | ICD-10-CM

## 2021-02-19 ENCOUNTER — Encounter: Payer: Self-pay | Admitting: Family

## 2021-02-19 ENCOUNTER — Telehealth (INDEPENDENT_AMBULATORY_CARE_PROVIDER_SITE_OTHER): Payer: Self-pay | Admitting: Family

## 2021-02-19 VITALS — Ht 65.0 in

## 2021-02-19 DIAGNOSIS — J40 Bronchitis, not specified as acute or chronic: Secondary | ICD-10-CM

## 2021-02-19 MED ORDER — BUDESONIDE-FORMOTEROL FUMARATE 80-4.5 MCG/ACT IN AERO: 2.0000 | INHALATION_SPRAY | Freq: Two times a day (BID) | RESPIRATORY_TRACT | 3 refills | Status: DC

## 2021-02-19 MED ORDER — AZELASTINE HCL 0.1 % NA SOLN: 1.0000 | Freq: Two times a day (BID) | NASAL | 4 refills | Status: DC

## 2021-02-19 MED ORDER — HYDROCODONE BIT-HOMATROP MBR 5-1.5 MG/5ML PO SOLN: 5.0000 mL | Freq: Every evening | ORAL | 0 refills | Status: DC | PRN

## 2021-02-19 NOTE — Patient Instructions (Signed)
Stop promethazine DM cough syrup Start hycodan cough syrup at bedtime Please take cough medication at night only as needed. As we discussed, I do not recommend dosing throughout the day as coughing is a protective mechanism . It also helps to break up thick mucous.  Do not take cough suppressants with alcohol as can lead to trouble breathing. Advise caution if taking cough suppressant and operating machinery ( i.e driving a car) as you may feel very tired.    Start symbicort, azelastine.   Let me know how you are doing

## 2021-02-19 NOTE — Assessment & Plan Note (Addendum)
No acute respiratory distress. She is afebrile. H/o asthma. Suspect lingering effect from viral URI. Start symbicort, azelastine. Hold flonase. Advised to stop promethazine DM. Start hycodan cough syrup. Advised no alcohol.  I looked up patient on [place] Controlled Substances Reporting System PMP AWARE and saw no activity that raised concern of inappropriate use.  She declines CXR and will call back if symptoms not improved this week so I may order.

## 2021-02-19 NOTE — Progress Notes (Signed)
Virtual Visit via Video Note  I connected with@  on 02/19/21 at 12:00 PM EDT by a video enabled telemedicine application and verified that I am speaking with the correct person using two identifiers.  Location patient: home Location provider:work  Persons participating in the virtual visit: patient, provider  I discussed the limitations of evaluation and management by telemedicine and the availability of in person appointments. The patient expressed understanding and agreed to proceed.   HPI: Cough x 3 weeks, improved.  Worse at night.  Cough is productive at times.  Endorses PND. No fever, sob, wheezing, CP, leg swelling, epigastric burning.   She has used albuterol with relief and getting a deep breath.  Diagnosed with asthma at 55 years old and 'never really bothered me' unless have a cold.   Patient had a visit for COVID-19 02/06/2021 a colleague, [place].  She was given Promethazine DM cough syrup and Z-Pak.    ROS: See pertinent positives and negatives per HPI.    EXAM:  VITALS per patient if applicable: Ht '5\' 5"'$  (1.651 m)   LMP 12/11/2013   BMI 37.61 kg/m  BP Readings from Last 3 Encounters:  01/28/20 140/77  11/29/19 137/74  10/12/19 121/63   Wt Readings from Last 3 Encounters:  01/28/20 226 lb (102.5 kg)  11/29/19 251 lb (113.9 kg)  10/12/19 245 lb (111.1 kg)    GENERAL: alert, oriented, appears well and in no acute distress  HEENT: atraumatic, conjunttiva clear, no obvious abnormalities on inspection of external nose and ears  NECK: normal movements of the head and neck  LUNGS: on inspection no signs of respiratory distress, breathing rate appears normal, no obvious gross SOB, gasping or wheezing  CV: no obvious cyanosis  MS: moves all visible extremities without noticeable abnormality  PSYCH/NEURO: pleasant and cooperative, no obvious depression or anxiety, speech and thought processing grossly intact  ASSESSMENT AND PLAN:  Discussed the  following assessment and plan:  Problem List Items Addressed This Visit       Respiratory   Bronchitis - Primary    No acute respiratory distress. She is afebrile. H/o asthma. Suspect lingering effect from viral URI. Start symbicort, azelastine. Hold flonase. Advised to stop promethazine DM. Start hycodan cough syrup. Advised no alcohol.  I looked up patient on [place] Controlled Substances Reporting System PMP AWARE and saw no activity that raised concern of inappropriate use.  She declines CXR and will call back if symptoms not improved this week so I may order.         Relevant Medications   azelastine (ASTELIN) 0.1 % nasal spray   budesonide-formoterol (SYMBICORT) 80-4.5 MCG/ACT inhaler   HYDROcodone bit-homatropine (HYCODAN) 5-1.5 MG/5ML syrup    -we discussed possible serious and likely etiologies, options for evaluation and workup, limitations of telemedicine visit vs in person visit, treatment, treatment risks and precautions. Pt prefers to treat via telemedicine empirically rather then risking or undertaking an in person visit at this moment.  .   I discussed the assessment and treatment plan with the patient. The patient was provided an opportunity to ask questions and all were answered. The patient agreed with the plan and demonstrated an understanding of the instructions.   The patient was advised to call back or seek an in-person evaluation if the symptoms worsen or if the condition fails to improve as anticipated.   [name], FNP

## 2021-02-23 ENCOUNTER — Telehealth: Payer: Self-pay | Admitting: Family

## 2021-02-23 ENCOUNTER — Other Ambulatory Visit: Payer: Self-pay

## 2021-02-23 DIAGNOSIS — J40 Bronchitis, not specified as acute or chronic: Secondary | ICD-10-CM

## 2021-02-23 MED ORDER — FLUTICASONE PROPIONATE HFA 44 MCG/ACT IN AERO: INHALATION_SPRAY | RESPIRATORY_TRACT | 5 refills | Status: DC

## 2021-02-23 NOTE — Telephone Encounter (Signed)
Call patient Insurance did not cover Symbicort so I have sent in Flovent to start instead

## 2021-02-23 NOTE — Telephone Encounter (Signed)
LM that Flovent was sent & if she has questions or concerns to please call back.

## 2021-02-23 NOTE — Telephone Encounter (Signed)
Pt called back & stated that the pharmacy didn't tell her that insurance preferred an alternative. She thought that the $150 was just the cost. Pt has paid & will cough meds plus the nasal spray she stated that she felt that she was really going in the right direction. She does not know that she will need to continue it once she has finished the inhaler she has. I will d/c Flovent from chart.

## 2021-03-13 ENCOUNTER — Other Ambulatory Visit: Payer: Self-pay

## 2021-03-13 ENCOUNTER — Encounter: Payer: Self-pay | Admitting: Family

## 2021-03-13 ENCOUNTER — Ambulatory Visit (INDEPENDENT_AMBULATORY_CARE_PROVIDER_SITE_OTHER): Payer: 59 | Admitting: Family

## 2021-03-13 VITALS — BP 140/78 | HR 89 | Temp 98.2°F | Ht 65.0 in | Wt 225.8 lb

## 2021-03-13 DIAGNOSIS — F32A Depression, unspecified: Secondary | ICD-10-CM

## 2021-03-13 DIAGNOSIS — G629 Polyneuropathy, unspecified: Secondary | ICD-10-CM | POA: Insufficient documentation

## 2021-03-13 DIAGNOSIS — I10 Essential (primary) hypertension: Secondary | ICD-10-CM | POA: Diagnosis not present

## 2021-03-13 DIAGNOSIS — F419 Anxiety disorder, unspecified: Secondary | ICD-10-CM | POA: Diagnosis not present

## 2021-03-13 DIAGNOSIS — G4700 Insomnia, unspecified: Secondary | ICD-10-CM

## 2021-03-13 DIAGNOSIS — G620 Drug-induced polyneuropathy: Secondary | ICD-10-CM | POA: Diagnosis not present

## 2021-03-13 DIAGNOSIS — Z1159 Encounter for screening for other viral diseases: Secondary | ICD-10-CM

## 2021-03-13 DIAGNOSIS — C20 Malignant neoplasm of rectum: Secondary | ICD-10-CM | POA: Diagnosis not present

## 2021-03-13 DIAGNOSIS — Z139 Encounter for screening, unspecified: Secondary | ICD-10-CM

## 2021-03-13 MED ORDER — GABAPENTIN 300 MG PO CAPS: ORAL_CAPSULE | ORAL | 5 refills | Status: DC

## 2021-03-13 MED ORDER — LOSARTAN POTASSIUM 25 MG PO TABS: 25.0000 mg | ORAL_TABLET | Freq: Every day | ORAL | 1 refills | Status: DC

## 2021-03-13 MED ORDER — ESCITALOPRAM OXALATE 10 MG PO TABS: 10.0000 mg | ORAL_TABLET | Freq: Every day | ORAL | 1 refills | Status: DC

## 2021-03-13 NOTE — Assessment & Plan Note (Signed)
Suboptimal control.  Wean down from [place] to start Lexapro.  Directions provided on after visit summary.  She will continue Wellbutrin 200 mg for now.  Close follow-up

## 2021-03-13 NOTE — Assessment & Plan Note (Signed)
Chronic, onset with chemotherapy.  Increase gabapentin to 600 mg morning, '600mg'$  midday and 600 mg at night.  We may ultimately increase to 600 mg 3 times daily.  We did discuss Lyrica as an alternative if ineffective.  We will follow-up

## 2021-03-13 NOTE — Assessment & Plan Note (Addendum)
Uncontrolled.  Concern for sleep as sleep not restorative and patient also snores.  Chronic fatigue.  However discussed this may also be related to inactivity in addition to possible underlying OSA.  Long discussion as it relates to concern for underlying sleep apnea.  Referral to pulmonology for further evaluation.  We will start with changes as noted regarding Lexapro, may consider trazodone or Ambien for sleep at follow-up.

## 2021-03-13 NOTE — Patient Instructions (Addendum)
Referral to pulmonology  Let us know if you dont hear back within a week in regards to an appointment being scheduled.   STOP zoloft '150mg'$  and wean to '100mg'$  zoloft tonight for 3-4 days. Then start zolot '50mg'$  for 3-4 days. After that point, you may STOP zoloft all together  and start lexapro '10mg'$   Increase gabapentin to '600mg'$  in the morning, '300mg'$  at midday, and '600mg'$  at night for 2 weeks.   If experiencing breakpain, may increase to include '600mg'$  at midday. Essentially that will be '600mg'$  three times per day. Start losartan '25mg'$ . Fasting labs in ONE week  It is imperative that you are seen AT least twice per year for labs and monitoring. Monitor blood pressure at home and me 5-6 reading on separate days. Goal is less than 120/80, based on newest guidelines, however we certainly want to be less than 130/80;  if persistently higher, please make sooner follow up appointment so we can recheck you blood pressure and manage/ adjust medications.

## 2021-03-13 NOTE — Assessment & Plan Note (Signed)
She is overdue for follow-up.  Call out with nurse to ensure that patient does arrange follow-up with Dr Burlene Arnt.

## 2021-03-13 NOTE — Progress Notes (Signed)
Subjective:    Patient ID: Tammie Robinson, female    DOB: February 23, 1966, 55 y.o.   MRN: [id number]  CC: Tammie Robinson is a 55 y.o. female who presents today for follow up.   HPI: Complains bilateral numbness in bilateral hands and feet x 7 years.  Numbness is only in the ankles and feet and does not travel up her arms or her legs.  Symptom has been unchanged; the onset correlates to when she had chemotherapy.  She denies any vision changes, headache, neck pain. No leg or back numbness She is compliant with gabapentin 300 mg once in the morning, once midday and '600mg'$  qhs.  She does not feel excessively sedated on this regimen  She complains of decreased motivation and hard to get going.  She does not necessarily feel depressed. She is not sure if zoloft '150mg'$  and wellbutrin '300mg'$  are helping. She continues to have trouble falling  asleep.  Goes to bed 1030pm, up at 530am.  She snores. Endorses fatigue for years which is unchanged. Sleep is not restorative.  Prozac was not effective in the past.  No si/hi No formal exercise.   history of rectal cancer 2015, status post radiation, chemotherapy completed in August 2015.  Reports chronic tingling and numbness in feet.  Last seen by Dr Burlene Arnt, oncology 01/2019.  At that time he prescribed gabapentin 300 mg once in the morning, once midday and '600mg'$  qhs.  Was to follow-up in 1 year.   Colonoscopy up-to-date 11/2019; repeat in 5 years  HTN- compliant with amlodipine '10mg'$ . No cp. No nsaid use.    HISTORY:  Past Medical History:  Diagnosis Date   Anemia    prior to CA dx   Asthma    seasonal,never hospitalized   Blood in stool    Colostomy in place [facility])    History of ERCP    Hypertension    Migraine    Neuropathy    feet and hands due to chemo   PTSD (post-traumatic stress disorder)    Rectal cancer (HCC)    Systolic murmur    Wears contact lenses    Past Surgical History:  Procedure Laterality Date   ABDOMINAL HYSTERECTOMY   04/12/2014   [facility] with CA surgery; no cervix ( M.Jerita Wimbush 01/2017)   CHOLECYSTECTOMY  2012   Dr. Jamal Collin   COLONOSCOPY WITH PROPOFOL N/A 01/26/2016   Procedure: COLONOSCOPY WITH PROPOFOL through colostomy;  Surgeon: Lucilla Lame, MD;  Location: [place];  Service: Endoscopy;  Laterality: N/A;  port-a-cath   COLONOSCOPY WITH PROPOFOL N/A 11/29/2019   Procedure: COLONOSCOPY WITH PROPOFOL;  Surgeon: Lucilla Lame, MD;  Location: [place];  Service: Endoscopy;  Laterality: N/A;   POLYPECTOMY  01/26/2016   Procedure: POLYPECTOMY;  Surgeon: Lucilla Lame, MD;  Location: [place];  Service: Endoscopy;;   POLYPECTOMY N/A 11/29/2019   Procedure: POLYPECTOMY;  Surgeon: Lucilla Lame, MD;  Location: [place];  Service: Endoscopy;  Laterality: N/A;   PORTA CATH REMOVAL N/A 01/28/2018   Procedure: PORTA CATH REMOVAL;  Surgeon: Algernon Huxley, MD;  Location: [place] CV LAB;  Service: Cardiovascular;  Laterality: N/A;   PORTACATH PLACEMENT     PROCTECTOMY  04/12/14   [facility] - CA surgery - with colostomy   SALPINGOOPHORECTOMY Bilateral 04/12/14   [facility] - with CA surg   VAGINA RECONSTRUCTION SURGERY  04/12/14   [facility] - transabdonimal rectus abdominus muscle reconstruction   VAGINAL DELIVERY     2, preterm  labor   Family History  Problem Relation Age of Onset   Arthritis Mother    Stroke Mother    Hypertension Mother    Diabetes Mother    Arthritis Father    Heart disease Father 28       s/p CABG   Stroke Father    Hypertension Father    Alcohol abuse Maternal Grandmother    Diabetes Maternal Grandmother    Alcohol abuse Maternal Grandfather    Diabetes Maternal Grandfather    Cancer Paternal Grandmother        breast   Heart disease Paternal Grandfather    Breast cancer Paternal Aunt 52   Colon cancer Neg Hx     Allergies: Lisinopril Current Outpatient Medications on File Prior to Visit  Medication Sig Dispense Refill   albuterol (PROVENTIL HFA;VENTOLIN HFA)  108 (90 Base) MCG/ACT inhaler Inhale 1-2 puffs into the lungs every 4 (four) hours as needed for wheezing or shortness of breath. 1 Inhaler 3   amLODipine (NORVASC) 10 MG tablet TAKE ONE TABLET BY MOUTH ONCE DAILY 90 tablet 0   azelastine (ASTELIN) 0.1 % nasal spray Place 1 spray into both nostrils 2 (two) times daily. Use in each nostril as directed 30 mL 4   budesonide-formoterol (SYMBICORT) 80-4.5 MCG/ACT inhaler Inhale 2 puffs into the lungs 2 (two) times daily.     buPROPion (WELLBUTRIN XL) 150 MG 24 hr tablet TAKE TWO TABLETS BY MOUTH EVERY MORNING 60 tablet 3   HYDROcodone bit-homatropine (HYCODAN) 5-1.5 MG/5ML syrup Take 5 mLs by mouth at bedtime as needed for cough. 60 mL 0   Current Facility-Administered Medications on File Prior to Visit  Medication Dose Route Frequency Provider Last Rate Last Admin   heparin lock flush 100 unit/mL  500 Units Intravenous Once Leia Alf, MD       sodium chloride 0.9 % injection 10 mL  10 mL Intravenous PRN Leia Alf, MD       sodium chloride 0.9 % injection 10 mL  10 mL Intravenous PRN Leia Alf, MD   10 mL at 11/28/14 1120   sodium chloride flush (NS) 0.9 % injection 10 mL  10 mL Intravenous PRN Evlyn Kanner, NP   10 mL at 01/03/16 1413   sodium chloride flush (NS) 0.9 % injection 10 mL  10 mL Intravenous PRN Lloyd Huger, MD   10 mL at 11/15/16 1111   sodium chloride flush (NS) 0.9 % injection 10 mL  10 mL Intravenous PRN Cammie Sickle, MD   10 mL at 11/24/17 1434    Social History   Tobacco Use   Smoking status: Never   Smokeless tobacco: Never  Vaping Use   Vaping Use: Never used  Substance Use Topics   Alcohol use: No    Comment: rare - holidays   Drug use: No    Review of Systems  Constitutional:  Negative for chills and fever.  Respiratory:  Negative for cough.   Cardiovascular:  Negative for chest pain and palpitations.  Gastrointestinal:  Negative for nausea and vomiting.  Neurological:   Positive for numbness.  Psychiatric/Behavioral:  Positive for sleep disturbance. Negative for suicidal ideas.      Objective:    BP 140/78   Pulse 89   Temp 98.2 F (36.8 C)   Ht '5\' 5"'$  (1.651 m)   Wt 225 lb 12.8 oz (102.4 kg)   LMP 12/11/2013   SpO2 97%   BMI 37.58 kg/m  BP Readings  from Last 3 Encounters:  03/13/21 140/78  01/28/20 140/77  11/29/19 137/74   Wt Readings from Last 3 Encounters:  03/13/21 225 lb 12.8 oz (102.4 kg)  01/28/20 226 lb (102.5 kg)  11/29/19 251 lb (113.9 kg)    Physical Exam Vitals reviewed.  Constitutional:      Appearance: She is well-developed.  Eyes:     Conjunctiva/sclera: Conjunctivae normal.  Cardiovascular:     Rate and Rhythm: Normal rate and regular rhythm.     Pulses: Normal pulses.     Heart sounds: Normal heart sounds.  Pulmonary:     Effort: Pulmonary effort is normal.     Breath sounds: Normal breath sounds. No wheezing, rhonchi or rales.  Skin:    General: Skin is warm and dry.  Neurological:     Mental Status: She is alert.  Psychiatric:        Speech: Speech normal.        Behavior: Behavior normal.        Thought Content: Thought content normal.       Assessment & Plan:   Problem List Items Addressed This Visit       Cardiovascular and Mediastinum   Hypertension    Uncontrolled, start losartan 25 mg.  BMP in 1 week.  Continue amlodipine 10 mg      Relevant Medications   losartan (COZAAR) 25 MG tablet   Other Relevant Orders   Lipid panel   Hemoglobin A1c   Comprehensive metabolic panel   CBC with Differential/Platelet   VITAMIN D 25 Hydroxy (Vit-D Deficiency, Fractures)     Digestive   Rectal cancer [facility])    She is overdue for follow-up.  Call out with nurse to ensure that patient does arrange follow-up with Dr Burlene Arnt.      Relevant Medications   gabapentin (NEURONTIN) 300 MG capsule     Nervous and Auditory   Peripheral neuropathy - Primary    Chronic, onset with chemotherapy.  Increase  gabapentin to 600 mg morning, '600mg'$  midday and 600 mg at night.  We may ultimately increase to 600 mg 3 times daily.  We did discuss Lyrica as an alternative if ineffective.  We will follow-up      Relevant Medications   escitalopram (LEXAPRO) 10 MG tablet   gabapentin (NEURONTIN) 300 MG capsule   Other Relevant Orders   B12 and Folate Panel   TSH     Other   Anxiety and depression    Suboptimal control.  Wean down from [place] to start Lexapro.  Directions provided on after visit summary.  She will continue Wellbutrin 200 mg for now.  Close follow-up      Relevant Medications   escitalopram (LEXAPRO) 10 MG tablet   Other Relevant Orders   Ambulatory referral to Pulmonology   Insomnia    Uncontrolled.  Concern for sleep is not restorative and patient also snores.  Chronic fatigue.  However discussed this may also be related to inactivity.  Long discussion as it relates to concern for underlying sleep apnea.  Referral to pulmonology for further evaluation.  We will start with changes as noted regarding Lexapro, may consider trazodone or Ambien for sleep at follow-up.      Other Visit Diagnoses     Encounter for hepatitis C screening test for low risk patient       Relevant Orders   Hepatitis C antibody   Screening due       Relevant Orders   MM  3D SCREEN BREAST BILATERAL        I have discontinued Marieta Dimambro. Chopin's sertraline and sertraline. I have also changed her gabapentin. Additionally, I am having her start on escitalopram and losartan. Lastly, I am having her maintain her albuterol, buPROPion, amLODipine, azelastine, HYDROcodone bit-homatropine, and budesonide-formoterol.   Meds ordered this encounter  Medications   escitalopram (LEXAPRO) 10 MG tablet    Sig: Take 1 tablet (10 mg total) by mouth daily.    Dispense:  90 tablet    Refill:  1    Order Specific Question:   Supervising Provider    Answer:   Deborra Medina L [[id number]]   losartan (COZAAR) 25 MG tablet     Sig: Take 1 tablet (25 mg total) by mouth daily.    Dispense:  90 tablet    Refill:  1    Order Specific Question:   Supervising Provider    Answer:   Deborra Medina L [[id number]]   gabapentin (NEURONTIN) 300 MG capsule    Sig: Take '600mg'$  qam, '300mg'$  midday and '600mg'$  po qpm.    Dispense:  150 capsule    Refill:  5    This prescription was filled on 01/13/2021. Any refills authorized will be placed on file.    Order Specific Question:   Supervising Provider    Answer:   Crecencio Mc [[id number]]    Return precautions given.   Risks, benefits, and alternatives of the medications and treatment plan prescribed today were discussed, and patient expressed understanding.   Education regarding symptom management and diagnosis given to patient on AVS.  Continue to follow with Burnard Hawthorne, FNP for routine health maintenance.   Lenard Simmer and I agreed with plan.   [name], FNP

## 2021-03-13 NOTE — Assessment & Plan Note (Signed)
Uncontrolled, start losartan 25 mg.  BMP in 1 week.  Continue amlodipine 10 mg

## 2021-03-14 ENCOUNTER — Encounter: Payer: Self-pay | Admitting: Family

## 2021-03-21 ENCOUNTER — Other Ambulatory Visit: Payer: Self-pay | Admitting: Family

## 2021-03-21 DIAGNOSIS — C20 Malignant neoplasm of rectum: Secondary | ICD-10-CM

## 2021-03-22 NOTE — Progress Notes (Signed)
Pt called back & stated this had already been addressed that she was released from Dr. Burlene Arnt.

## 2021-03-22 NOTE — Progress Notes (Signed)
LMTCB

## 2021-03-27 ENCOUNTER — Other Ambulatory Visit (INDEPENDENT_AMBULATORY_CARE_PROVIDER_SITE_OTHER): Payer: 59

## 2021-03-27 ENCOUNTER — Ambulatory Visit
Admission: RE | Admit: 2021-03-27 | Discharge: 2021-03-27 | Disposition: A | Discharge status: Home / Self Care | Payer: 59 | Source: Ambulatory Visit | Attending: Family | Admitting: Family

## 2021-03-27 ENCOUNTER — Other Ambulatory Visit: Payer: Self-pay

## 2021-03-27 DIAGNOSIS — Z1231 Encounter for screening mammogram for malignant neoplasm of breast: Secondary | ICD-10-CM | POA: Diagnosis present

## 2021-03-27 DIAGNOSIS — Z1159 Encounter for screening for other viral diseases: Secondary | ICD-10-CM

## 2021-03-27 DIAGNOSIS — C20 Malignant neoplasm of rectum: Secondary | ICD-10-CM

## 2021-03-27 DIAGNOSIS — I10 Essential (primary) hypertension: Secondary | ICD-10-CM

## 2021-03-27 DIAGNOSIS — Z139 Encounter for screening, unspecified: Secondary | ICD-10-CM

## 2021-03-27 DIAGNOSIS — G620 Drug-induced polyneuropathy: Secondary | ICD-10-CM | POA: Diagnosis not present

## 2021-03-27 LAB — COMPREHENSIVE METABOLIC PANEL
ALT: 19 U/L (ref 0–35)
AST: 14 U/L (ref 0–37)
Albumin: 3.8 g/dL (ref 3.5–5.2)
Alkaline Phosphatase: 128 U/L — ABNORMAL HIGH (ref 39–117)
BUN: 14 mg/dL (ref 6–23)
CO2: 27 mEq/L (ref 19–32)
Calcium: 9.2 mg/dL (ref 8.4–10.5)
Chloride: 106 mEq/L (ref 96–112)
Creatinine, Ser: 0.64 mg/dL (ref 0.40–1.20)
GFR: 99.25 mL/min (ref >60.00)
Glucose, Bld: 99 mg/dL (ref 70–99)
Potassium: 4.3 mEq/L (ref 3.5–5.1)
Sodium: 140 mEq/L (ref 135–145)
Total Bilirubin: 0.5 mg/dL (ref 0.2–1.2)
Total Protein: 6.5 g/dL (ref 6.0–8.3)

## 2021-03-27 LAB — CBC WITH DIFFERENTIAL/PLATELET
Basophils Absolute: 0 10*3/uL (ref 0.0–0.1)
Basophils Relative: 0.5 % (ref 0.0–3.0)
Eosinophils Absolute: 0.3 10*3/uL (ref 0.0–0.7)
Eosinophils Relative: 4 % (ref 0.0–5.0)
HCT: 36.1 % (ref 36.0–46.0)
Hemoglobin: 11.8 g/dL — ABNORMAL LOW (ref 12.0–15.0)
Lymphocytes Relative: 22.9 % (ref 12.0–46.0)
Lymphs Abs: 1.5 10*3/uL (ref 0.7–4.0)
MCHC: 32.8 g/dL (ref 30.0–36.0)
MCV: 81 fl (ref 78.0–100.0)
Monocytes Absolute: 0.5 10*3/uL (ref 0.1–1.0)
Monocytes Relative: 7 % (ref 3.0–12.0)
Neutro Abs: 4.3 10*3/uL (ref 1.4–7.7)
Neutrophils Relative %: 65.6 % (ref 43.0–77.0)
Platelets: 197 10*3/uL (ref 150.0–400.0)
RBC: 4.45 Mil/uL (ref 3.87–5.11)
RDW: 13.4 % (ref 11.5–15.5)
WBC: 6.6 10*3/uL (ref 4.0–10.5)

## 2021-03-27 LAB — LIPID PANEL
Cholesterol: 128 mg/dL (ref 0–200)
HDL: 43.3 mg/dL (ref >39.00)
LDL Cholesterol: 65 mg/dL (ref 0–99)
NonHDL: 84.53
Total CHOL/HDL Ratio: 3
Triglycerides: 100 mg/dL (ref 0.0–149.0)
VLDL: 20 mg/dL (ref 0.0–40.0)

## 2021-03-27 LAB — HEMOGLOBIN A1C: Hgb A1c MFr Bld: 5.3 % (ref 4.6–6.5)

## 2021-03-27 LAB — TSH: TSH: 0 u[IU]/mL — ABNORMAL LOW (ref 0.35–5.50)

## 2021-03-27 LAB — B12 AND FOLATE PANEL
Folate: 8.4 ng/mL (ref >5.9)
Vitamin B-12: 270 pg/mL (ref 211–911)

## 2021-03-27 LAB — VITAMIN D 25 HYDROXY (VIT D DEFICIENCY, FRACTURES): VITD: 20.5 ng/mL — ABNORMAL LOW (ref 30.00–100.00)

## 2021-03-28 LAB — CEA: CEA: 1 ng/mL

## 2021-03-28 LAB — HEPATITIS C ANTIBODY
Hepatitis C Ab: NONREACTIVE
SIGNAL TO CUT-OFF: 0.01 (ref <1.00)

## 2021-04-01 ENCOUNTER — Other Ambulatory Visit: Payer: Self-pay | Admitting: Family

## 2021-04-01 DIAGNOSIS — R899 Unspecified abnormal finding in specimens from other organs, systems and tissues: Secondary | ICD-10-CM

## 2021-04-02 ENCOUNTER — Telehealth: Payer: Self-pay

## 2021-04-02 NOTE — Telephone Encounter (Signed)
LMTCB to get patient scheduled for first month of B-12 injections & labs within the next couple weeks.

## 2021-04-02 NOTE — Telephone Encounter (Signed)
Patient is returning your call from earlier,please advise.

## 2021-04-03 NOTE — Telephone Encounter (Signed)
LMTCB

## 2021-04-04 ENCOUNTER — Ambulatory Visit (INDEPENDENT_AMBULATORY_CARE_PROVIDER_SITE_OTHER): Payer: 59

## 2021-04-04 ENCOUNTER — Other Ambulatory Visit: Payer: Self-pay

## 2021-04-04 DIAGNOSIS — E538 Deficiency of other specified B group vitamins: Secondary | ICD-10-CM | POA: Diagnosis not present

## 2021-04-04 MED ORDER — CYANOCOBALAMIN 1000 MCG/ML IJ SOLN
1000.0000 ug | Freq: Once | INTRAMUSCULAR | Status: AC
  Administered 2021-04-04: 1000 ug via INTRAMUSCULAR

## 2021-04-04 NOTE — Progress Notes (Signed)
Patient presented for B 12 injection to right deltoid, patient voiced no concerns nor showed any signs of distress during injection. 

## 2021-04-06 ENCOUNTER — Encounter: Payer: Self-pay | Admitting: Family

## 2021-04-06 NOTE — Telephone Encounter (Signed)
I have corresponded with patient through mychart.

## 2021-04-11 ENCOUNTER — Other Ambulatory Visit: Payer: Self-pay

## 2021-04-11 ENCOUNTER — Other Ambulatory Visit: Payer: Self-pay | Admitting: Family

## 2021-04-11 ENCOUNTER — Ambulatory Visit (INDEPENDENT_AMBULATORY_CARE_PROVIDER_SITE_OTHER): Payer: 59

## 2021-04-11 DIAGNOSIS — E538 Deficiency of other specified B group vitamins: Secondary | ICD-10-CM

## 2021-04-11 DIAGNOSIS — F419 Anxiety disorder, unspecified: Secondary | ICD-10-CM

## 2021-04-11 DIAGNOSIS — I10 Essential (primary) hypertension: Secondary | ICD-10-CM

## 2021-04-11 DIAGNOSIS — F32A Depression, unspecified: Secondary | ICD-10-CM

## 2021-04-11 MED ORDER — CYANOCOBALAMIN 1000 MCG/ML IJ SOLN
1000.0000 ug | Freq: Once | INTRAMUSCULAR | Status: AC
  Administered 2021-04-11: 1000 ug via INTRAMUSCULAR

## 2021-04-11 NOTE — Progress Notes (Signed)
Patient presented for B 12 injection to right deltoid, patient voiced no concerns nor showed any signs of distress during injection. 

## 2021-04-12 ENCOUNTER — Telehealth: Payer: Self-pay | Admitting: Family

## 2021-04-12 ENCOUNTER — Encounter: Payer: Self-pay | Admitting: *Deleted

## 2021-04-12 NOTE — Telephone Encounter (Signed)
LMTCB and sent my chart have scheduled labs same day and time of B 12 injection just need to make patient aware to have labs drawn before getting B 12 injection.

## 2021-04-12 NOTE — Telephone Encounter (Signed)
Call patient Had ordered further labs to investigate etiology B12 deficiency.  When she comes in on 04/18/2021, please also add lab visit for her.  Labs have already been ordered.  These are non fasting

## 2021-04-16 NOTE — Telephone Encounter (Signed)
I reached out to patient through mychart asking her to please also remind [place] that she needs her labs drawn before her B-12 on Wednesday.

## 2021-04-18 ENCOUNTER — Other Ambulatory Visit: Payer: 59

## 2021-04-18 ENCOUNTER — Ambulatory Visit (INDEPENDENT_AMBULATORY_CARE_PROVIDER_SITE_OTHER): Payer: 59

## 2021-04-18 ENCOUNTER — Other Ambulatory Visit: Payer: Self-pay

## 2021-04-18 DIAGNOSIS — R899 Unspecified abnormal finding in specimens from other organs, systems and tissues: Secondary | ICD-10-CM | POA: Diagnosis not present

## 2021-04-18 DIAGNOSIS — E538 Deficiency of other specified B group vitamins: Secondary | ICD-10-CM | POA: Diagnosis not present

## 2021-04-18 LAB — T4, FREE: Free T4: 3.06 ng/dL — ABNORMAL HIGH (ref 0.60–1.60)

## 2021-04-18 LAB — T3, FREE: T3, Free: 6.4 pg/mL — ABNORMAL HIGH (ref 2.3–4.2)

## 2021-04-18 LAB — B12 AND FOLATE PANEL
Folate: 8 ng/mL (ref >5.9)
Vitamin B-12: 575 pg/mL (ref 211–911)

## 2021-04-18 LAB — TSH: TSH: 0 u[IU]/mL — ABNORMAL LOW (ref 0.35–5.50)

## 2021-04-18 LAB — GAMMA GT: GGT: 38 U/L (ref 7–51)

## 2021-04-18 MED ORDER — CYANOCOBALAMIN 1000 MCG/ML IJ SOLN
1000.0000 ug | Freq: Once | INTRAMUSCULAR | Status: AC
  Administered 2021-04-18: 1000 ug via INTRAMUSCULAR

## 2021-04-18 NOTE — Progress Notes (Signed)
Patient presented for B 12 injection to right deltoid, patient voiced no concerns nor showed any signs of distress during injection. 

## 2021-04-21 DIAGNOSIS — E041 Nontoxic single thyroid nodule: Secondary | ICD-10-CM

## 2021-04-21 HISTORY — DX: Nontoxic single thyroid nodule: E04.1

## 2021-04-22 LAB — METHYLMALONIC ACID, SERUM: Methylmalonic Acid, Quant: 222 nmol/L (ref 87–318)

## 2021-04-22 LAB — INTRINSIC FACTOR ANTIBODIES: Intrinsic Factor: NEGATIVE

## 2021-04-22 LAB — HOMOCYSTEINE: Homocysteine: 11.4 umol/L — ABNORMAL HIGH (ref <10.4)

## 2021-04-22 LAB — ANTI-PARIETAL ANTIBODY: PARIETAL CELL AB SCREEN: NEGATIVE

## 2021-04-23 LAB — ALKALINE PHOSPHATASE, ISOENZYMES
Alkaline Phosphatase: 149 IU/L — ABNORMAL HIGH (ref 44–121)
BONE FRACTION: 71 % — ABNORMAL HIGH (ref 14–68)
INTESTINAL FRAC.: 0 % (ref 0–18)
LIVER FRACTION: 29 % (ref 18–85)

## 2021-04-23 LAB — CELIAC DISEASE AB SCREEN W/RFX
Antigliadin Abs, IgA: 3 units (ref 0–19)
IgA/Immunoglobulin A, Serum: 337 mg/dL (ref 87–352)
Transglutaminase IgA: <2 U/mL (ref 0–3)

## 2021-04-24 ENCOUNTER — Other Ambulatory Visit: Payer: Self-pay | Admitting: Family

## 2021-04-24 ENCOUNTER — Other Ambulatory Visit: Payer: Self-pay

## 2021-04-24 ENCOUNTER — Encounter: Payer: Self-pay | Admitting: Family

## 2021-04-24 ENCOUNTER — Ambulatory Visit (INDEPENDENT_AMBULATORY_CARE_PROVIDER_SITE_OTHER): Payer: 59 | Admitting: Family

## 2021-04-24 VITALS — BP 150/70 | HR 78 | Temp 98.7°F | Ht 65.0 in | Wt 233.8 lb

## 2021-04-24 DIAGNOSIS — F32A Depression, unspecified: Secondary | ICD-10-CM

## 2021-04-24 DIAGNOSIS — Z0000 Encounter for general adult medical examination without abnormal findings: Secondary | ICD-10-CM | POA: Diagnosis not present

## 2021-04-24 DIAGNOSIS — I10 Essential (primary) hypertension: Secondary | ICD-10-CM

## 2021-04-24 DIAGNOSIS — F419 Anxiety disorder, unspecified: Secondary | ICD-10-CM

## 2021-04-24 DIAGNOSIS — E538 Deficiency of other specified B group vitamins: Secondary | ICD-10-CM | POA: Diagnosis not present

## 2021-04-24 DIAGNOSIS — Z23 Encounter for immunization: Secondary | ICD-10-CM | POA: Diagnosis not present

## 2021-04-24 DIAGNOSIS — R748 Abnormal levels of other serum enzymes: Secondary | ICD-10-CM

## 2021-04-24 DIAGNOSIS — R7989 Other specified abnormal findings of blood chemistry: Secondary | ICD-10-CM

## 2021-04-24 MED ORDER — HYDROXYZINE HCL 10 MG PO TABS: 10.0000 mg | ORAL_TABLET | Freq: Two times a day (BID) | ORAL | 0 refills | Status: DC | PRN

## 2021-04-24 NOTE — Progress Notes (Signed)
Subjective:    Patient ID: Tammie Robinson, female    DOB: [birth year], 55 y.o.   MRN: [id number]  CC: Tammie Robinson is a 55 y.o. female who presents today for physical exam and follow up.    HPI: HPI Feels well today No complaints.   B12 deficiency-she has been getting monthly B12 injections.  She is not sure that she can tell a huge difference with B12 as it relates to fatigue however she  would like to continue injections for couple more months.  Hypertension-compliant with losartan 25 mg, amlodipine 10 mg. At home 111/53, 121/63. No cp.   Peripheral neuropathy-compliant with gabapentin 600 mg 3 times daily. Gabapentin is not sedating for her.   Anxiety and depression-compliant with Lexapro 10 mg, Wellbutrin 300 mg. No significant changes. Trouble sleeping and thinks anxiety can be contributory.  Tried ambien 10mg , trazodone with mixed results. Ambien worked better. No alcohol use with ambien. No falls, sleep walking.   Colorectal Cancer Screening: UTD; had this done with Dr. Allen Mcewen 2021.  Repeat in 5 years Breast Cancer Screening: Mammogram UTD Cervical Cancer Screening: History of hysterectomy at [facility] with proctectomy . She states no cervix, ovaries          Tetanus - UTD      Exercise: Gets regular exercise walking dog at night.  Alcohol use:  rare Smoking/tobacco use: Nonsmoker.     HISTORY:  Past Medical History:  Diagnosis Date   Anemia    prior to CA dx   Asthma    seasonal,never hospitalized   Blood in stool    Colostomy in place [facility])    History of ERCP    Hypertension    Migraine    Neuropathy    feet and hands due to chemo   PTSD (post-traumatic stress disorder)    Rectal cancer (HCC)    Systolic murmur    Wears contact lenses     Past Surgical History:  Procedure Laterality Date   ABDOMINAL HYSTERECTOMY  04/12/2014   [facility] with CA surgery; no cervix ( M.Gayl Ivanoff 01/2017)   CHOLECYSTECTOMY  2012   Dr. Jamal Collin   COLONOSCOPY WITH PROPOFOL N/A 01/26/2016    Procedure: COLONOSCOPY WITH PROPOFOL through colostomy;  Surgeon: Lucilla Lame, MD;  Location: [place];  Service: Endoscopy;  Laterality: N/A;  port-a-cath   COLONOSCOPY WITH PROPOFOL N/A 11/29/2019   Procedure: COLONOSCOPY WITH PROPOFOL;  Surgeon: Lucilla Lame, MD;  Location: [place];  Service: Endoscopy;  Laterality: N/A;   POLYPECTOMY  01/26/2016   Procedure: POLYPECTOMY;  Surgeon: Lucilla Lame, MD;  Location: [place];  Service: Endoscopy;;   POLYPECTOMY N/A 11/29/2019   Procedure: POLYPECTOMY;  Surgeon: Lucilla Lame, MD;  Location: [place];  Service: Endoscopy;  Laterality: N/A;   PORTA CATH REMOVAL N/A 01/28/2018   Procedure: PORTA CATH REMOVAL;  Surgeon: Algernon Huxley, MD;  Location: [place] CV LAB;  Service: Cardiovascular;  Laterality: N/A;   PORTACATH PLACEMENT     PROCTECTOMY  04/12/14   [facility] - CA surgery - with colostomy   SALPINGOOPHORECTOMY Bilateral 04/12/14   [facility] - with CA surg   VAGINA RECONSTRUCTION SURGERY  04/12/14   [facility] - transabdonimal rectus abdominus muscle reconstruction   VAGINAL DELIVERY     2, preterm labor   Family History  Problem Relation Age of Onset   Arthritis Mother    Stroke Mother    Hypertension Mother    Diabetes Mother  Arthritis Father    Heart disease Father 66       s/p CABG   Stroke Father    Hypertension Father    Breast cancer Paternal Aunt 43   Alcohol abuse Maternal Grandmother    Diabetes Maternal Grandmother    Alcohol abuse Maternal Grandfather    Diabetes Maternal Grandfather    Breast cancer Paternal Grandmother    Cancer Paternal Grandmother        breast   Heart disease Paternal Grandfather    Colon cancer Neg Hx       ALLERGIES: Lisinopril  Current Outpatient Medications on File Prior to Visit  Medication Sig Dispense Refill   albuterol (PROVENTIL HFA;VENTOLIN HFA) 108 (90 Base) MCG/ACT inhaler Inhale 1-2 puffs into the lungs every 4 (four) hours as needed for  wheezing or shortness of breath. 1 Inhaler 3   amLODipine (NORVASC) 10 MG tablet TAKE ONE TABLET BY MOUTH ONCE DAILY 90 tablet 0   azelastine (ASTELIN) 0.1 % nasal spray Place 1 spray into both nostrils 2 (two) times daily. Use in each nostril as directed 30 mL 4   budesonide-formoterol (SYMBICORT) 80-4.5 MCG/ACT inhaler Inhale 2 puffs into the lungs 2 (two) times daily.     buPROPion (WELLBUTRIN XL) 150 MG 24 hr tablet TAKE TWO TABLETS BY MOUTH EVERY MORNING 60 tablet 3   escitalopram (LEXAPRO) 10 MG tablet TAKE ONE TABLET BY MOUTH ONCE DAILY 90 tablet 1   gabapentin (NEURONTIN) 300 MG capsule Take 600mg  qam, 300mg  midday and 600mg  po qpm. 150 capsule 5   losartan (COZAAR) 25 MG tablet TAKE ONE TABLET BY MOUTH ONCE DAILY 90 tablet 1   Current Facility-Administered Medications on File Prior to Visit  Medication Dose Route Frequency Provider Last Rate Last Admin   heparin lock flush 100 unit/mL  500 Units Intravenous Once Leia Alf, MD       sodium chloride 0.9 % injection 10 mL  10 mL Intravenous PRN Leia Alf, MD       sodium chloride 0.9 % injection 10 mL  10 mL Intravenous PRN Leia Alf, MD   10 mL at 11/28/14 1120   sodium chloride flush (NS) 0.9 % injection 10 mL  10 mL Intravenous PRN Evlyn Kanner, NP   10 mL at 01/03/16 1413   sodium chloride flush (NS) 0.9 % injection 10 mL  10 mL Intravenous PRN Lloyd Huger, MD   10 mL at 11/15/16 1111   sodium chloride flush (NS) 0.9 % injection 10 mL  10 mL Intravenous PRN Cammie Sickle, MD   10 mL at 11/24/17 1434    Social History   Tobacco Use   Smoking status: Never   Smokeless tobacco: Never  Vaping Use   Vaping Use: Never used  Substance Use Topics   Alcohol use: No    Comment: rare - holidays   Drug use: No    Review of Systems  Constitutional:  Negative for chills, fever and unexpected weight change.  HENT:  Negative for congestion.   Respiratory:  Negative for cough.   Cardiovascular:   Negative for chest pain, palpitations and leg swelling.  Gastrointestinal:  Negative for nausea and vomiting.  Musculoskeletal:  Negative for arthralgias and myalgias.  Skin:  Negative for rash.  Neurological:  Positive for numbness (peripheral). Negative for headaches.  Hematological:  Negative for adenopathy.  Psychiatric/Behavioral:  Positive for sleep disturbance. Negative for confusion and suicidal ideas. The patient is nervous/anxious.  Objective:    BP (!) 150/70 (BP Location: Left Arm, Patient Position: Sitting, Cuff Size: Large)   Pulse 78   Temp 98.7 F (37.1 C) (Oral)   Ht 5\' 5"  (1.651 m)   Wt 233 lb 12.8 oz (106.1 kg)   LMP 12/11/2013   SpO2 98%   BMI 38.91 kg/m   BP Readings from Last 3 Encounters:  04/24/21 (!) 150/70  03/13/21 140/78  01/28/20 140/77   Wt Readings from Last 3 Encounters:  04/24/21 233 lb 12.8 oz (106.1 kg)  03/13/21 225 lb 12.8 oz (102.4 kg)  01/28/20 226 lb (102.5 kg)    Physical Exam     Assessment & Plan:   Problem List Items Addressed This Visit       Cardiovascular and Mediastinum   Hypertension    Pressure elevated today however at home blood pressure is very well controlled.  We opted not to change medications at this time due to readings at home; she will continue losartan 25 mg, amlodipine 10 mg.  If blood pressure continue to remain elevated, patient will call me and we will increase losartan to 50 mg        Other   Anxiety and depression - Primary    Chronic,overall controlled.  She is having trouble falling asleep.  Continue Lexapro 10 mg, Wellbutrin 300 mg and start Atarax 10 mg twice daily as needed for anxiety, insomnia      Relevant Medications   hydrOXYzine (ATARAX/VISTARIL) 10 MG tablet   B12 deficiency    She has not derived great benefit from B12 injections as it relates to peripheral neuropathy, fatigue.  She would like to continue for couple more months just to be sure.  Otherwise advised B12 as normalized  that she may stop B12 altogether or she may opt to start a multivitamin which includes B12      Elevated alkaline phosphatase level    Pending referral to endocrinology as appears to more of bone etiology      Low TSH level    Pending referral to endocrinology      Routine physical examination    Clinical breast exam performed, mammogram is up-to-date.  Up-to-date as well as colonoscopy.  Deferred pelvic exam in the absence of complaints and patient's also has had a history of hysterectomy, oophorectomy; She reports that she does not have her cervix.  Encouraged continued walking      Other Visit Diagnoses     Need for immunization against influenza       Relevant Orders   Flu Vaccine QUAD 55mo+IM (Fluarix, Fluzone & Alfiuria Quad PF) (Completed)        I am having Tammie Robinson start on hydrOXYzine. I am also having her maintain her albuterol, amLODipine, azelastine, budesonide-formoterol, gabapentin, buPROPion, escitalopram, and losartan.   Meds ordered this encounter  Medications   hydrOXYzine (ATARAX/VISTARIL) 10 MG tablet    Sig: Take 1 tablet (10 mg total) by mouth 2 (two) times daily as needed for anxiety (insomnia).    Dispense:  90 tablet    Refill:  0    Order Specific Question:   Supervising Provider    Answer:   Crecencio Mc [[id number]]     Return precautions given.   Risks, benefits, and alternatives of the medications and treatment plan prescribed today were discussed, and patient expressed understanding.   Education regarding symptom management and diagnosis given to patient on AVS.   Continue to follow with [name]  G, FNP for routine health maintenance.   Tammie Robinson and I agreed with plan.   [name], FNP

## 2021-04-24 NOTE — Patient Instructions (Addendum)
I have ordered a thyroid ultrasound and also placed referral for endocrinology as discussed.  I am also looking into pulmonology referral as well for sleep apnea evaluation.  Let us know if you dont hear back within a week in regards to an appointment being scheduled.   It is imperative that you are seen AT least twice per year for labs and monitoring. Monitor blood pressure at home and me 5-6 reading on separate days. Goal is less than 120/80, based on newest guidelines, however we certainly want to be less than 130/80;  if persistently higher, please make sooner follow up appointment so we can recheck you blood pressure and manage/ adjust medications.  Health Maintenance for Postmenopausal Women Menopause is a normal process in which your ability to get pregnant comes to an end. This process happens slowly over many months or years, usually between the ages of 66 and 56. Menopause is complete when you have missed your menstrual periods for 12 months. It is important to talk with your health care provider about some of the most common conditions that affect women after menopause (postmenopausal women). These include heart disease, cancer, and bone loss (osteoporosis). Adopting a healthy lifestyle and getting preventive care can help to promote your health and wellness. The actions you take can also lower your chances of developing some of these common conditions. What should I know about menopause? During menopause, you may get a number of symptoms, such as: Hot flashes. These can be moderate or severe. Night sweats. Decrease in sex drive. Mood swings. Headaches. Tiredness. Irritability. Memory problems. Insomnia. Choosing to treat or not to treat these symptoms is a decision that you make with your health care provider. Do I need hormone replacement therapy? Hormone replacement therapy is effective in treating symptoms that are caused by menopause, such as hot flashes and night  sweats. Hormone replacement carries certain risks, especially as you become older. If you are thinking about using estrogen or estrogen with progestin, discuss the benefits and risks with your health care provider. What is my risk for heart disease and stroke? The risk of heart disease, heart attack, and stroke increases as you age. One of the causes may be a change in the body's hormones during menopause. This can affect how your body uses dietary fats, triglycerides, and cholesterol. Heart attack and stroke are medical emergencies. There are many things that you can do to help prevent heart disease and stroke. Watch your blood pressure High blood pressure causes heart disease and increases the risk of stroke. This is more likely to develop in people who have high blood pressure readings, are of African descent, or are overweight. Have your blood pressure checked: Every 3-5 years if you are 50-14 years of age. Every year if you are 44 years old or older. Eat a healthy diet  Eat a diet that includes plenty of vegetables, fruits, low-fat dairy products, and lean protein. Do not eat a lot of foods that are high in solid fats, added sugars, or sodium. Get regular exercise Get regular exercise. This is one of the most important things you can do for your health. Most adults should: Try to exercise for at least 150 minutes each week. The exercise should increase your heart rate and make you sweat (moderate-intensity exercise). Try to do strengthening exercises at least twice each week. Do these in addition to the moderate-intensity exercise. Spend less time sitting. Even light physical activity can be beneficial. Other tips Work with your health  care provider to achieve or maintain a healthy weight. Do not use any products that contain nicotine or tobacco, such as cigarettes, e-cigarettes, and chewing tobacco. If you need help quitting, ask your health care provider. Know your numbers. Ask your  health care provider to check your cholesterol and your blood sugar (glucose). Continue to have your blood tested as directed by your health care provider. Do I need screening for cancer? Depending on your health history and family history, you may need to have cancer screening at different stages of your life. This may include screening for: Breast cancer. Cervical cancer. Lung cancer. Colorectal cancer. What is my risk for osteoporosis? After menopause, you may be at increased risk for osteoporosis. Osteoporosis is a condition in which bone destruction happens more quickly than new bone creation. To help prevent osteoporosis or the bone fractures that can happen because of osteoporosis, you may take the following actions: If you are 61-53 years old, get at least 1,000 mg of calcium and at least 600 mg of vitamin D per day. If you are older than age 67 but younger than age 59, get at least 1,200 mg of calcium and at least 600 mg of vitamin D per day. If you are older than age 11, get at least 1,200 mg of calcium and at least 800 mg of vitamin D per day. Smoking and drinking excessive alcohol increase the risk of osteoporosis. Eat foods that are rich in calcium and vitamin D, and do weight-bearing exercises several times each week as directed by your health care provider. How does menopause affect my mental health? Depression may occur at any age, but it is more common as you become older. Common symptoms of depression include: Low or sad mood. Changes in sleep patterns. Changes in appetite or eating patterns. Feeling an overall lack of motivation or enjoyment of activities that you previously enjoyed. Frequent crying spells. Talk with your health care provider if you think that you are experiencing depression. General instructions See your health care provider for regular wellness exams and vaccines. This may include: Scheduling regular health, dental, and eye exams. Getting and maintaining  your vaccines. These include: Influenza vaccine. Get this vaccine each year before the flu season begins. Pneumonia vaccine. Shingles vaccine. Tetanus, diphtheria, and pertussis (Tdap) booster vaccine. Your health care provider may also recommend other immunizations. Tell your health care provider if you have ever been abused or do not feel safe at home. Summary Menopause is a normal process in which your ability to get pregnant comes to an end. This condition causes hot flashes, night sweats, decreased interest in sex, mood swings, headaches, or lack of sleep. Treatment for this condition may include hormone replacement therapy. Take actions to keep yourself healthy, including exercising regularly, eating a healthy diet, watching your weight, and checking your blood pressure and blood sugar levels. Get screened for cancer and depression. Make sure that you are up to date with all your vaccines. This information is not intended to replace advice given to you by your health care provider. Make sure you discuss any questions you have with your health care provider. Document Revised: 07/01/2018 Document Reviewed: 07/01/2018 Elsevier Patient Education  2022 [employer].

## 2021-04-25 ENCOUNTER — Ambulatory Visit: Payer: 59

## 2021-04-25 ENCOUNTER — Ambulatory Visit (INDEPENDENT_AMBULATORY_CARE_PROVIDER_SITE_OTHER): Payer: 59

## 2021-04-25 ENCOUNTER — Other Ambulatory Visit: Payer: Self-pay

## 2021-04-25 DIAGNOSIS — E538 Deficiency of other specified B group vitamins: Secondary | ICD-10-CM

## 2021-04-25 MED ORDER — CYANOCOBALAMIN 1000 MCG/ML IJ SOLN
1000.0000 ug | Freq: Once | INTRAMUSCULAR | Status: AC
  Administered 2021-04-25: 1000 ug via INTRAMUSCULAR

## 2021-04-25 NOTE — Assessment & Plan Note (Signed)
Clinical breast exam performed, mammogram is up-to-date.  Up-to-date as well as colonoscopy.  Deferred pelvic exam in the absence of complaints and patient's also has had a history of hysterectomy, oophorectomy; She reports that she does not have her cervix.  Encouraged continued walking

## 2021-04-25 NOTE — Assessment & Plan Note (Signed)
Pending referral to endocrinology as appears to more of bone etiology

## 2021-04-25 NOTE — Assessment & Plan Note (Addendum)
Chronic,overall controlled.  She is having trouble falling asleep.  Continue Lexapro 10 mg, Wellbutrin 300 mg and start Atarax 10 mg twice daily as needed for anxiety, insomnia

## 2021-04-25 NOTE — Assessment & Plan Note (Signed)
Pressure elevated today however at home blood pressure is very well controlled.  We opted not to change medications at this time due to readings at home; she will continue losartan 25 mg, amlodipine 10 mg.  If blood pressure continue to remain elevated, patient will call me and we will increase losartan to 50 mg

## 2021-04-25 NOTE — Assessment & Plan Note (Signed)
She has not derived great benefit from B12 injections as it relates to peripheral neuropathy, fatigue.  She would like to continue for couple more months just to be sure.  Otherwise advised B12 as normalized that she may stop B12 altogether or she may opt to start a multivitamin which includes B12

## 2021-04-25 NOTE — Assessment & Plan Note (Signed)
Pending referral to endocrinology

## 2021-04-25 NOTE — Progress Notes (Signed)
Patient presented for B 12 injection to left deltoid, patient voiced no concerns nor showed any signs of distress during injection. 

## 2021-04-29 ENCOUNTER — Other Ambulatory Visit: Payer: Self-pay | Admitting: Family

## 2021-04-29 DIAGNOSIS — R5383 Other fatigue: Secondary | ICD-10-CM

## 2021-04-30 ENCOUNTER — Other Ambulatory Visit: Payer: Self-pay | Admitting: Family

## 2021-04-30 ENCOUNTER — Encounter: Payer: Self-pay | Admitting: Family

## 2021-04-30 DIAGNOSIS — F32A Depression, unspecified: Secondary | ICD-10-CM

## 2021-04-30 MED ORDER — HYDROXYZINE HCL 10 MG PO TABS
20.0000 mg | ORAL_TABLET | Freq: Every evening | ORAL | 0 refills | Status: DC | PRN
Start: 2021-04-30 — End: 2021-05-02

## 2021-05-02 ENCOUNTER — Ambulatory Visit: Payer: 59

## 2021-05-02 ENCOUNTER — Ambulatory Visit
Admission: RE | Admit: 2021-05-02 | Discharge: 2021-05-02 | Disposition: A | Discharge status: Home / Self Care | Payer: 59 | Source: Ambulatory Visit | Attending: Family | Admitting: Family

## 2021-05-02 ENCOUNTER — Other Ambulatory Visit: Payer: Self-pay | Admitting: Family

## 2021-05-02 ENCOUNTER — Other Ambulatory Visit: Payer: Self-pay

## 2021-05-02 DIAGNOSIS — R7989 Other specified abnormal findings of blood chemistry: Secondary | ICD-10-CM | POA: Insufficient documentation

## 2021-05-02 DIAGNOSIS — G4700 Insomnia, unspecified: Secondary | ICD-10-CM

## 2021-05-02 MED ORDER — ZOLPIDEM TARTRATE 5 MG PO TABS: 5.0000 mg | ORAL_TABLET | Freq: Every evening | ORAL | 2 refills | Status: DC | PRN

## 2021-05-02 NOTE — Progress Notes (Signed)
I looked up patient on [place] Controlled Substances Reporting System PMP AWARE and saw no activity that raised concern of inappropriate use.   

## 2021-05-03 ENCOUNTER — Encounter: Payer: Self-pay | Admitting: Family

## 2021-05-09 ENCOUNTER — Ambulatory Visit (INDEPENDENT_AMBULATORY_CARE_PROVIDER_SITE_OTHER): Payer: 59 | Admitting: Family

## 2021-05-09 ENCOUNTER — Other Ambulatory Visit: Payer: Self-pay

## 2021-05-09 ENCOUNTER — Telehealth: Payer: Self-pay | Admitting: Family

## 2021-05-09 VITALS — BP 140/78 | HR 81 | Temp 96.6°F | Ht 65.0 in | Wt 233.0 lb

## 2021-05-09 DIAGNOSIS — G4700 Insomnia, unspecified: Secondary | ICD-10-CM | POA: Diagnosis not present

## 2021-05-09 DIAGNOSIS — I10 Essential (primary) hypertension: Secondary | ICD-10-CM

## 2021-05-09 MED ORDER — ZOLPIDEM TARTRATE ER 6.25 MG PO TBCR: 6.2500 mg | EXTENDED_RELEASE_TABLET | Freq: Every evening | ORAL | 2 refills | Status: DC | PRN

## 2021-05-09 MED ORDER — LOSARTAN POTASSIUM 25 MG PO TABS: ORAL_TABLET | ORAL | 2 refills | Status: DC

## 2021-05-09 NOTE — Assessment & Plan Note (Signed)
Improved, yet controlled.  Agreed that she could try Ambien 6.25 CR with her next refill.  Counseled on risk of Ambien 10 mg dose with excessive sedation and certainly worry as patient ages if on this dose.  Would like to avoid Ambien 10 mg if we can.  Patient verbalized understanding of all. I looked up patient on [place] Controlled Substances Reporting System PMP AWARE and saw no activity that raised concern of inappropriate use.

## 2021-05-09 NOTE — Assessment & Plan Note (Signed)
Uncontrolled.  Increase losartan 25mg   to 1.5 tablet daily.  Continue amlodipine 10 mg.  Patient will monitor blood pressure at home with understanding that her wrist cuff appears to be about 20 points inaccurate ( runs lower). Labs in one week

## 2021-05-09 NOTE — Progress Notes (Signed)
Subjective:    Patient ID: Tammie Robinson, female    DOB: Mar 27, 1966, 55 y.o.   MRN: [id number]  CC: Tammie Robinson is a 55 y.o. female who presents today for follow up.   HPI: Blood pressure follow-up.  She has brought her wrist blood pressure monitor with her today.  Today she got 140/80. On average in 130/70.  Wrist cuff in exam room 120/80.  She is compliant with losartan 25 mg, amlodipine 10mg  . No cp, sob  Insomnia-Atarax is not effective. She  started Ambien 5mg  which has been helpful now that she gets about 4 and half hours of sleep.  She reports that she wakes up in middle of night and cannot go back to sleep.  She previously was on Ambien 10 mg which worked well for her.  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 Jamal Collin   COLONOSCOPY WITH PROPOFOL N/A 01/26/2016   Procedure: COLONOSCOPY WITH PROPOFOL through colostomy;  Surgeon: Lucilla Lame, MD;  Location: [place];  Service: Endoscopy;  Laterality: N/A;  port-a-cath   COLONOSCOPY WITH PROPOFOL N/A 11/29/2019   Procedure: COLONOSCOPY WITH PROPOFOL;  Surgeon: Lucilla Lame, MD;  Location: [place];  Service: Endoscopy;  Laterality: N/A;   POLYPECTOMY  01/26/2016   Procedure: POLYPECTOMY;  Surgeon: Lucilla Lame, MD;  Location: [place];  Service: Endoscopy;;   POLYPECTOMY N/A 11/29/2019   Procedure: POLYPECTOMY;  Surgeon: Lucilla Lame, MD;  Location: [place];  Service:  Endoscopy;  Laterality: N/A;   PORTA CATH REMOVAL N/A 01/28/2018   Procedure: PORTA CATH REMOVAL;  Surgeon: Algernon Huxley, MD;  Location: [place] [facility];  Service: Cardiovascular;  Laterality: N/A;   PORTACATH PLACEMENT     PROCTECTOMY  04/12/14   [facility] - CA surgery - with colostomy   SALPINGOOPHORECTOMY Bilateral 04/12/14   [facility] - with CA surg   VAGINA RECONSTRUCTION SURGERY  04/12/14   [facility] - transabdonimal rectus abdominus muscle reconstruction   VAGINAL DELIVERY     2, preterm labor   Family History  Problem Relation Age of Onset   Arthritis Mother    Stroke Mother    Hypertension Mother    Diabetes Mother    Arthritis Father    Heart disease Father 29       s/p CABG   Stroke Father    Hypertension Father    Breast cancer Paternal Aunt 69   Alcohol abuse Maternal Grandmother    Diabetes Maternal Grandmother    Alcohol abuse Maternal Grandfather    Diabetes Maternal Grandfather    Breast cancer Paternal Grandmother    Cancer Paternal Grandmother        breast   Heart disease Paternal Grandfather    Colon cancer Neg Hx     Allergies:  Lisinopril Current Outpatient Medications on File Prior to Visit  Medication Sig Dispense Refill   albuterol (PROVENTIL HFA;VENTOLIN HFA) 108 (90 Base) MCG/ACT inhaler Inhale 1-2 puffs into the lungs every 4 (four) hours as needed for wheezing or shortness of breath. 1 Inhaler 3   amLODipine (NORVASC) 10 MG tablet TAKE ONE TABLET BY MOUTH ONCE DAILY 90 tablet 0   azelastine (ASTELIN) 0.1 % nasal spray Place 1 spray into both nostrils 2 (two) times daily. Use in each nostril as directed 30 mL 4   budesonide-formoterol (SYMBICORT) 80-4.5 MCG/ACT inhaler Inhale 2 puffs into the lungs 2 (two) times daily.     buPROPion (WELLBUTRIN XL) 150 MG 24 hr tablet TAKE TWO TABLETS BY MOUTH EVERY MORNING 60 tablet 3   escitalopram (LEXAPRO) 10 MG tablet TAKE ONE TABLET BY MOUTH ONCE DAILY 90 tablet 1   gabapentin (NEURONTIN) 300 MG capsule Take 600mg  qam,  300mg  midday and 600mg  po qpm. 150 capsule 5   Current Facility-Administered Medications on File Prior to Visit  Medication Dose Route Frequency Provider Last Rate Last Admin   heparin lock flush 100 unit/mL  500 Units Intravenous Once Leia Alf, MD       sodium chloride 0.9 % injection 10 mL  10 mL Intravenous PRN Leia Alf, MD       sodium chloride 0.9 % injection 10 mL  10 mL Intravenous PRN Leia Alf, MD   10 mL at 11/28/14 1120   sodium chloride flush (NS) 0.9 % injection 10 mL  10 mL Intravenous PRN Evlyn Kanner, NP   10 mL at 01/03/16 1413   sodium chloride flush (NS) 0.9 % injection 10 mL  10 mL Intravenous PRN Lloyd Huger, MD   10 mL at 11/15/16 1111   sodium chloride flush (NS) 0.9 % injection 10 mL  10 mL Intravenous PRN Cammie Sickle, MD   10 mL at 11/24/17 1434    Social History   Tobacco Use   Smoking status: Never   Smokeless tobacco: Never  Vaping Use   Vaping Use: Never used  Substance Use Topics   Alcohol use: No    Comment: rare - holidays   Drug use: No    Review of Systems  Constitutional:  Negative for chills and fever.  Respiratory:  Negative for cough.   Cardiovascular:  Negative for chest pain and palpitations.  Gastrointestinal:  Negative for nausea and vomiting.  Psychiatric/Behavioral:  Positive for sleep disturbance.      Objective:    BP 140/78 Comment: right arm  Pulse 81   Temp (!) 96.6 F (35.9 C)   Ht 5\' 5"  (1.651 m)   Wt 233 lb (105.7 kg)   LMP 12/11/2013   SpO2 99%   BMI 38.77 kg/m  BP Readings from Last 3 Encounters:  05/09/21 140/78  04/24/21 (!) 150/70  03/13/21 140/78   Wt Readings from Last 3 Encounters:  05/09/21 233 lb (105.7 kg)  04/24/21 233 lb 12.8 oz (106.1 kg)  03/13/21 225 lb 12.8 oz (102.4 kg)    Physical Exam Vitals reviewed.  Constitutional:      Appearance: She is well-developed.  Eyes:     Conjunctiva/sclera: Conjunctivae normal.  Cardiovascular:     Rate and  Rhythm: Normal rate and regular rhythm.     Pulses: Normal pulses.     Heart sounds: Normal heart sounds.  Pulmonary:     Effort: Pulmonary effort is normal.     Breath sounds: Normal  breath sounds. No wheezing, rhonchi or rales.  Skin:    General: Skin is warm and dry.  Neurological:     Mental Status: She is alert.  Psychiatric:        Speech: Speech normal.        Behavior: Behavior normal.        Thought Content: Thought content normal.       Assessment & Plan:   Problem List Items Addressed This Visit       Cardiovascular and Mediastinum   Hypertension - Primary    Uncontrolled.  Increase losartan 25mg   to 1.5 tablet daily.  Continue amlodipine 10 mg.  Patient will monitor blood pressure at home with understanding that her wrist cuff appears to be about 20 points inaccurate ( runs lower). Labs in one week      Relevant Medications   losartan (COZAAR) 25 MG tablet   Other Relevant Orders   Basic metabolic panel     Other   Insomnia   Relevant Medications   zolpidem (AMBIEN CR) 6.25 MG CR tablet     I have discontinued Chidinma W. Uselman's losartan and zolpidem. I am also having her start on losartan and zolpidem. Additionally, I am having her maintain her albuterol, amLODipine, azelastine, budesonide-formoterol, gabapentin, buPROPion, and escitalopram.   Meds ordered this encounter  Medications   losartan (COZAAR) 25 MG tablet    Sig: Take 1.5 tablet daily.    Dispense:  180 tablet    Refill:  2    Order Specific Question:   Supervising Provider    Answer:   Deborra Medina L [[id number]]   zolpidem (AMBIEN CR) 6.25 MG CR tablet    Sig: Take 1 tablet (6.25 mg total) by mouth at bedtime as needed for sleep.    Dispense:  30 tablet    Refill:  2    Discontinue ambien 5mg . DNF 06/01/21    Order Specific Question:   Supervising Provider    Answer:   Crecencio Mc [[id number]]    Return precautions given.   Risks, benefits, and alternatives of the medications and  treatment plan prescribed today were discussed, and patient expressed understanding.   Education regarding symptom management and diagnosis given to patient on AVS.  Continue to follow with Burnard Hawthorne, FNP for routine health maintenance.   Lenard Simmer and I agreed with plan.   [name], FNP

## 2021-05-09 NOTE — Telephone Encounter (Signed)
Call endo  Can we move her appt up earlier to this calendar?

## 2021-05-10 NOTE — Telephone Encounter (Signed)
Unfortunately they are booked out due to only have the two providers now at [place].

## 2021-05-11 NOTE — Telephone Encounter (Signed)
I was left on hold with [facility] & will have to call back at a later time.

## 2021-05-11 NOTE — Telephone Encounter (Signed)
Sounds like you spoke with them  To be sure , can she be on cancellation list?

## 2021-05-15 ENCOUNTER — Encounter: Payer: Self-pay | Admitting: Family

## 2021-05-16 ENCOUNTER — Other Ambulatory Visit: Payer: Self-pay | Admitting: Family

## 2021-05-16 DIAGNOSIS — I10 Essential (primary) hypertension: Secondary | ICD-10-CM

## 2021-05-22 ENCOUNTER — Telehealth: Payer: Self-pay

## 2021-05-22 NOTE — Telephone Encounter (Signed)
Patient states has not had a sleep study before. Patient phone number is [phone].

## 2021-05-22 NOTE — Telephone Encounter (Signed)
Called and LVM in regards to if patient has ever had sleep study before.

## 2021-05-22 NOTE — Telephone Encounter (Signed)
Noted.  Will close encounter.  

## 2021-05-23 ENCOUNTER — Encounter: Payer: Self-pay | Admitting: Primary Care

## 2021-05-23 ENCOUNTER — Ambulatory Visit (INDEPENDENT_AMBULATORY_CARE_PROVIDER_SITE_OTHER): Payer: 59 | Admitting: Primary Care

## 2021-05-23 ENCOUNTER — Other Ambulatory Visit: Payer: Self-pay

## 2021-05-23 VITALS — BP 140/60 | HR 84 | Temp 98.2°F | Ht 65.0 in | Wt 232.4 lb

## 2021-05-23 DIAGNOSIS — Z0189 Encounter for other specified special examinations: Secondary | ICD-10-CM | POA: Diagnosis not present

## 2021-05-23 DIAGNOSIS — G4700 Insomnia, unspecified: Secondary | ICD-10-CM | POA: Diagnosis not present

## 2021-05-23 DIAGNOSIS — R0683 Snoring: Secondary | ICD-10-CM

## 2021-05-23 NOTE — Progress Notes (Signed)
Reviewed and agree with assessment/plan.   Chesley Mires, MD [facility] 05/23/2021, 2:49 PM Pager:  [phone]

## 2021-05-23 NOTE — Telephone Encounter (Signed)
Checking one more time Any luck with moving up endo appt?

## 2021-05-23 NOTE — Patient Instructions (Signed)
Untreated sleep apnea puts you at higher risk for cardiac arrhthymias, stroke, pulmonary HTN, DM  Treatment for sleep apnea include weight loss, side sleeping position, oral appliance, CPAP therapy or referral to ENT  Recommendations: Focus on side sleeping position or elevate head of bed at night Maintain healthy weight Get some physical exercise daily Do not drive if experiencing excessive daytime sleepiness   Orders: Home sleep test: snoring   Follow-up: 4-6 weeks virtual video visit or in office to review sleep study results   Sleep Apnea Sleep apnea affects breathing during sleep. It causes breathing to stop for 10 seconds or more, or to become shallow. People with sleep apnea usually snore loudly. It can also increase the risk of: Heart attack. Stroke. Being very overweight (obese). Diabetes. Heart failure. Irregular heartbeat. High blood pressure. The goal of treatment is to help you breathe normally again. What are the causes? The most common cause of this condition is a collapsed or blocked airway. There are three kinds of sleep apnea: Obstructive sleep apnea. This is caused by a blocked or collapsed airway. Central sleep apnea. This happens when the brain does not send the right signals to the muscles that control breathing. Mixed sleep apnea. This is a combination of obstructive and central sleep apnea. What increases the risk? Being overweight. Smoking. Having a small airway. Being older. Being female. Drinking alcohol. Taking medicines to calm yourself (sedatives or tranquilizers). Having family members with the condition. Having a tongue or tonsils that are larger than normal. What are the signs or symptoms? Trouble staying asleep. Loud snoring. Headaches in the morning. Waking up gasping. Dry mouth or sore throat in the morning. Being sleepy or tired during the day. If you are sleepy or tired during the day, you may also: Not be able to focus your  mind (concentrate). Forget things. Get angry a lot and have mood swings. Feel sad (depressed). Have changes in your personality. Have less interest in sex, if you are female. Be unable to have an erection, if you are female. How is this treated?  Sleeping on your side. Using a medicine to get rid of mucus in your nose (decongestant). Avoiding the use of alcohol, medicines to help you relax, or certain pain medicines (narcotics). Losing weight, if needed. Changing your diet. Quitting smoking. Using a machine to open your airway while you sleep, such as: An oral appliance. This is a mouthpiece that shifts your lower jaw forward. A CPAP device. This device blows air through a mask when you breathe out (exhale). An EPAP device. This has valves that you put in each nostril. A BPAP device. This device blows air through a mask when you breathe in (inhale) and breathe out. Having surgery if other treatments do not work. Follow these instructions at home: Lifestyle Make changes that your doctor recommends. Eat a healthy diet. Lose weight if needed. Avoid alcohol, medicines to help you relax, and some pain medicines. Do not smoke or use any products that contain nicotine or tobacco. If you need help quitting, ask your doctor. General instructions Take over-the-counter and prescription medicines only as told by your doctor. If you were given a machine to use while you sleep, use it only as told by your doctor. If you are having surgery, make sure to tell your doctor you have sleep apnea. You may need to bring your device with you. Keep all follow-up visits. Contact a doctor if: The machine that you were given to use during sleep  bothers you or does not seem to be working. You do not get better. You get worse. Get help right away if: Your chest hurts. You have trouble breathing in enough air. You have an uncomfortable feeling in your back, arms, or stomach. You have trouble talking. One  side of your body feels weak. A part of your face is hanging down. These symptoms may be an emergency. Get help right away. Call your local emergency services (911 in the [place] Do not wait to see if the symptoms will go away. Do not drive yourself to the hospital. Summary This condition affects breathing during sleep. The most common cause is a collapsed or blocked airway. The goal of treatment is to help you breathe normally while you sleep. This information is not intended to replace advice given to you by your health care provider. Make sure you discuss any questions you have with your health care provider. Document Revised: 06/16/2020 Document Reviewed: 06/16/2020 Elsevier Patient Education  2022 [employer].

## 2021-05-23 NOTE — Telephone Encounter (Signed)
noted 

## 2021-05-23 NOTE — Assessment & Plan Note (Addendum)
-   Continue Ambien 10mg  at bedtime for insomnia  - Advised not to drive if experiencing excessive daytime sleepiness and to use caution with sleep aid

## 2021-05-23 NOTE — Assessment & Plan Note (Signed)
-   Patient has symptoms of insomnia, restless sleep, loud snoring and daytime sleepiness. No prior sleep study. Epworth 7/24. Needs home sleep test to evaluate for underlying sleep apnea. We discussed risk of untreated sleep apnea including cardiac arrhythmias, stroke, pulmonary HTN or DM. We also reviewed treatment options including weight loss, oral appliance, CPAP therapy or referral to ENT. We will follow-up in 4-6 weeks to review sleep study results and discuss treatment options further if needed. If she does not have sleep apnea we may consider trying different sleep aid.

## 2021-05-23 NOTE — Addendum Note (Signed)
Addended by: Martyn Ehrich on: 05/23/2021 02:00 PM   Modules accepted: Level of Service

## 2021-05-23 NOTE — Progress Notes (Signed)
@Patient  ID: Tammie Robinson, female    DOB: 1965-08-08, 55 y.o.   MRN: [id number]  No chief complaint on file.   Referring provider: Burnard Hawthorne, FNP  HPI: 55 year old female, never smoked. PMH significant for hypertension, rectal cancer, murmur, obesity.  05/23/2021 Patient presents today for sleep consult. She has a hard time falling and staying asleep. She was on Ambien CR 6.25 which did not help. She was recently changed to Ambien 10mg . She wakes up an hour or two after falling asleep. She probably gets a total of 4 hours of sleep a night. She has been told by her husband that she snores. She cares for two family members during the day, she does not get any formal exercise. Denies symptoms of sleep walking, cataplexy, narcolepsy.   Sleep questionnaire Prior sleep study- None  Symptoms- Insomnia, restless sleep, loud snoring, daytime fatigue  Bedtime-10pm-12am Take it takes to fall asleep- A long time  Nocturnal awakenings- once after initially fallings asleep Time out of bed in morning- 6am Weight changes- up and down Epworth- 7   Allergies  Allergen Reactions   Lisinopril Hives and Rash    Immunization History  Administered Date(s) Administered   Influenza Inj Mdck Quad With Preservative 06/10/2017   Influenza Split 04/14/2012   Influenza, Seasonal, Injecte, Preservative Fre 05/09/2009   Influenza,inj,Quad PF,6+ Mos 05/01/2015, 04/24/2021   Influenza-Unspecified 06/10/2017, 05/11/2018, 05/06/2019, 07/07/2020   PFIZER(Purple Top)SARS-COV-2 Vaccination 10/22/2019, 11/15/2019, 07/09/2020   Tdap 04/14/2012, 08/30/2020    Past Medical History:  Diagnosis Date   Anemia    prior to CA dx   Asthma    seasonal,never hospitalized   Blood in stool    Colostomy in place [facility])    History of ERCP    Hypertension    Migraine    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 1 Inhaler 3   amLODipine (NORVASC) 10 MG tablet TAKE ONE TABLET BY MOUTH ONCE DAILY 90 tablet 0   azelastine (ASTELIN) 0.1 % nasal spray Place 1 spray into both nostrils 2 (two) times daily. Use in each nostril as directed 30 mL 4   budesonide-formoterol (SYMBICORT) 80-4.5 MCG/ACT inhaler Inhale 2 puffs into the lungs 2 (two) times daily.     buPROPion (WELLBUTRIN XL) 150 MG 24 hr tablet TAKE TWO TABLETS BY MOUTH EVERY MORNING 60 tablet 3   escitalopram (LEXAPRO) 10 MG tablet TAKE ONE TABLET BY MOUTH ONCE DAILY 90 tablet 1   gabapentin (NEURONTIN) 300 MG capsule Take 600mg  qam, 300mg  midday and 600mg  po qpm. 150 capsule 5   losartan (COZAAR) 25 MG tablet Take 1.5 tablet daily. 180 tablet 2   zolpidem (AMBIEN CR) 6.25 MG CR tablet Take 1 tablet (6.25 mg total) by mouth at bedtime as needed for sleep. 30 tablet 2   Facility-Administered Medications Prior to Visit  Medication Dose Route Frequency Provider Last Rate Last Admin   heparin lock flush 100 unit/mL  500 Units Intravenous Once Leia Alf, MD       sodium chloride 0.9 % injection 10 mL  10 mL Intravenous PRN Leia Alf, MD       sodium chloride 0.9 % injection 10 mL  10 mL Intravenous PRN Leia Alf, MD   10 mL at 11/28/14 1120   sodium chloride flush (NS) 0.9 % injection 10 mL  10 mL Intravenous PRN Evlyn Kanner, NP   10 mL at 01/03/16 1413   sodium chloride flush (NS) 0.9 % injection 10 mL  10 mL Intravenous PRN Lloyd Huger, MD   10 mL at 11/15/16 1111   sodium chloride flush (NS) 0.9 % injection 10 mL  10 mL  Intravenous PRN Cammie Sickle, MD   10 mL at 11/24/17 1434    Review of Systems  Review of Systems  Constitutional:  Positive for fatigue.  HENT: Negative.    Respiratory: Negative.    Psychiatric/Behavioral:  Positive for sleep disturbance.     Physical Exam  BP 140/60 (BP Location: Left Arm, Patient Position: Sitting, Cuff Size: Normal)   Pulse 84   Temp 98.2 F (36.8 C) (Oral)   Ht 5\' 5"  (1.651 m)   Wt 232 lb 6.4 oz (105.4 kg)   LMP 12/11/2013   SpO2 98%   BMI 38.67 kg/m  Physical Exam Constitutional:      Appearance: Normal appearance.  HENT:     Head: Normocephalic and atraumatic.     Mouth/Throat:     Mouth: Mucous membranes are moist.     Pharynx: Oropharynx is clear.     Comments: Mallampati class I-II Cardiovascular:     Rate and Rhythm: Normal rate and regular rhythm.     Heart sounds: Murmur heard.  Pulmonary:     Effort: Pulmonary effort is normal.     Breath sounds: Normal breath sounds.  Skin:    General: Skin is warm and dry.  Neurological:     General: No focal deficit present.     Mental Status: She is alert and oriented to person, place, and time. Mental status is at baseline.  Psychiatric:        Mood and Affect: Mood normal.        Behavior: Behavior normal.        Thought Content: Thought content normal.        Judgment: Judgment normal.     Lab Results:  CBC    Component Value Date/Time   WBC 6.6 03/27/2021 0803   RBC 4.45 03/27/2021 0803   HGB 11.8 (L) 03/27/2021 0803   HGB 12.4 11/14/2014 1013   HCT 36.1 03/27/2021 0803   HCT 37.4 11/14/2014 1013   PLT 197.0 03/27/2021 0803   PLT 69 (L) 11/14/2014 1013   MCV 81.0 03/27/2021 0803   MCV 87 11/14/2014 1013   MCH 28.6 01/28/2020 1302   MCHC 32.8 03/27/2021 0803   RDW 13.4 03/27/2021 0803   RDW 16.1 (H) 11/14/2014 1013   LYMPHSABS 1.5 03/27/2021 0803   LYMPHSABS 0.4 (L) 11/14/2014 1013   MONOABS 0.5 03/27/2021 0803   MONOABS 1.1 (H) 11/14/2014 1013   EOSABS 0.3  03/27/2021 0803   EOSABS 0.2 11/14/2014 1013   BASOSABS 0.0 03/27/2021 0803   BASOSABS 0.0 11/14/2014 1013    BMET    Component Value Date/Time   NA 140 03/27/2021 0803   NA 144 08/29/2014 0908   K 4.3 03/27/2021 0803   K 3.3 (L) 08/29/2014 0908  CL 106 03/27/2021 0803   CL 107 08/29/2014 0908   CO2 27 03/27/2021 0803   CO2 29 08/29/2014 0908   GLUCOSE 99 03/27/2021 0803   GLUCOSE 88 08/29/2014 0908   BUN 14 03/27/2021 0803   BUN 18 08/29/2014 0908   CREATININE 0.64 03/27/2021 0803   CREATININE 0.52 11/07/2014 0902   CALCIUM 9.2 03/27/2021 0803   CALCIUM 8.4 (L) 08/29/2014 0908   GFRNONAA 56 (L) 01/28/2020 1302   GFRNONAA >60 11/07/2014 0902   GFRAA >60 01/28/2020 1302   GFRAA >60 11/07/2014 0902    BNP No results found for: BNP  ProBNP No results found for: PROBNP  Imaging: US THYROID  Result Date: 05/02/2021 CLINICAL DATA:  Other. Low TSH. Elevated T3 and T4. Evaluate for thyroid nodules. EXAM: THYROID ULTRASOUND TECHNIQUE: Ultrasound examination of the thyroid gland and adjacent soft tissues was performed. COMPARISON:  None. FINDINGS: Parenchymal Echotexture: Mildly heterogenous Isthmus: Normal in size measures 0.5 cm in diameter Right lobe: Borderline enlarged measuring 5.1 x 3.2 x 2.6 cm Left lobe: Normal in size measuring 4.5 x 2.1 x 1.6 cm _________________________________________________________ Estimated total number of nodules >/= 1 cm: 2 Number of spongiform nodules >/=  2 cm not described below (TR1): 0 Number of mixed cystic and solid nodules >/= 1.5 cm not described below (TR2): 0 _________________________________________________________ Nodule # 1: Location: Right; Inferior Maximum size: 3.8 cm; Other 2 dimensions: 3.5 x 3.5 cm Composition: solid/almost completely solid (2) Echogenicity: hyperechoic (1) Shape: taller-than-wide (3) Margins: smooth (0) Echogenic foci: none (0) ACR TI-RADS total points: 6. ACR TI-RADS risk category: TR4 (4-6 points). ACR TI-RADS  recommendations: **Given size (>/= 1.5 cm) and appearance, fine needle aspiration of this moderately suspicious nodule should be considered based on TI-RADS criteria. _________________________________________________________ There is an approximately 0.8 cm ill-defined hypoechoic nodule/pseudonodule within the superior pole of the left lobe of the thyroid (labeled 2), which does not meet criteria to recommend percutaneous sampling or continued dedicated follow-up. Questioned 0.6 cm ill-defined hypoechoic nodule within the inferior pole of the left lobe of the thyroid (labeled 3), is favored to represent a pseudonodule as it lacks defined borders on both provided transverse and sagittal images. _________________________________________________________ Note is made of a mildly prominent though non pathologically enlarged left cervical lymph node which measures 0.8 cm in greatest short axis diameter, presumably reactive in etiology. IMPRESSION: 1. Borderline thyromegaly with findings suggestive of multinodular goiter. 2. Nodule #1 meets imaging criteria to recommend percutaneous sampling as indicated. 3. None of the additional discretely measured nodules/pseudo nodules meet imaging criteria to recommend percutaneous sampling or continued dedicated follow-up. The above is in keeping with the ACR TI-RADS recommendations - J Am Coll Radiol 2017;14:587-595. Electronically Signed   By: Sandi Mariscal M.D.   On: 05/02/2021 14:43     Assessment & Plan:   Loud snoring - Patient has symptoms of insomnia, restless sleep, loud snoring and daytime sleepiness. No prior sleep study. Epworth 7/24. Needs home sleep test to evaluate for underlying sleep apnea. We discussed risk of untreated sleep apnea including cardiac arrhythmias, stroke, pulmonary HTN or DM. We also reviewed treatment options including weight loss, oral appliance, CPAP therapy or referral to ENT. We will follow-up in 4-6 weeks to review sleep study results and  discuss treatment options further if needed. If she does not have sleep apnea we may consider trying different sleep aid.   Insomnia - Continue Ambien 10mg  at bedtime for insomnia  - Advised not to drive if experiencing excessive  daytime sleepiness and to use caution with sleep aid    Martyn Ehrich, NP 05/23/2021

## 2021-05-24 ENCOUNTER — Ambulatory Visit (INDEPENDENT_AMBULATORY_CARE_PROVIDER_SITE_OTHER): Payer: 59

## 2021-05-24 ENCOUNTER — Other Ambulatory Visit: Payer: Self-pay

## 2021-05-24 DIAGNOSIS — E538 Deficiency of other specified B group vitamins: Secondary | ICD-10-CM | POA: Diagnosis not present

## 2021-05-24 MED ORDER — CYANOCOBALAMIN 1000 MCG/ML IJ SOLN
1000.0000 ug | Freq: Once | INTRAMUSCULAR | Status: AC
  Administered 2021-05-24: 1000 ug via INTRAMUSCULAR

## 2021-05-24 NOTE — Progress Notes (Signed)
Patient presented for B 12 injection to right deltoid, patient voiced no concerns nor showed any signs of distress during injection. 

## 2021-05-25 ENCOUNTER — Other Ambulatory Visit: Payer: Self-pay

## 2021-05-25 ENCOUNTER — Other Ambulatory Visit (INDEPENDENT_AMBULATORY_CARE_PROVIDER_SITE_OTHER): Payer: 59

## 2021-05-25 DIAGNOSIS — I10 Essential (primary) hypertension: Secondary | ICD-10-CM | POA: Diagnosis not present

## 2021-05-25 LAB — BASIC METABOLIC PANEL
BUN: 16 mg/dL (ref 6–23)
CO2: 29 mEq/L (ref 19–32)
Calcium: 8.9 mg/dL (ref 8.4–10.5)
Chloride: 106 mEq/L (ref 96–112)
Creatinine, Ser: 0.73 mg/dL (ref 0.40–1.20)
GFR: 92.26 mL/min (ref >60.00)
Glucose, Bld: 97 mg/dL (ref 70–99)
Potassium: 4.2 mEq/L (ref 3.5–5.1)
Sodium: 141 mEq/L (ref 135–145)

## 2021-05-28 ENCOUNTER — Ambulatory Visit: Payer: 59

## 2021-05-30 ENCOUNTER — Other Ambulatory Visit: Payer: Self-pay | Admitting: Family

## 2021-05-30 DIAGNOSIS — G4700 Insomnia, unspecified: Secondary | ICD-10-CM

## 2021-05-30 MED ORDER — ZOLPIDEM TARTRATE 10 MG PO TABS: 10.0000 mg | ORAL_TABLET | Freq: Every evening | ORAL | 2 refills | Status: DC | PRN

## 2021-05-30 NOTE — Progress Notes (Signed)
Sent in [place] 10mg   I looked up patient on [place] Controlled Substances Reporting System PMP AWARE and saw no activity that raised concern of inappropriate use.

## 2021-07-13 ENCOUNTER — Encounter: Payer: Self-pay | Admitting: Family

## 2021-07-13 ENCOUNTER — Other Ambulatory Visit: Payer: Self-pay

## 2021-07-13 ENCOUNTER — Ambulatory Visit: Payer: 59

## 2021-07-13 DIAGNOSIS — G4733 Obstructive sleep apnea (adult) (pediatric): Secondary | ICD-10-CM | POA: Diagnosis not present

## 2021-07-13 DIAGNOSIS — I10 Essential (primary) hypertension: Secondary | ICD-10-CM

## 2021-07-13 DIAGNOSIS — Z0189 Encounter for other specified special examinations: Secondary | ICD-10-CM

## 2021-07-13 MED ORDER — LOSARTAN POTASSIUM 25 MG PO TABS: 37.5000 mg | ORAL_TABLET | Freq: Every day | ORAL | 2 refills | Status: DC

## 2021-07-18 ENCOUNTER — Ambulatory Visit: Payer: 59 | Admitting: Primary Care

## 2021-07-24 DIAGNOSIS — G4733 Obstructive sleep apnea (adult) (pediatric): Secondary | ICD-10-CM | POA: Diagnosis not present

## 2021-07-27 ENCOUNTER — Encounter: Payer: Self-pay | Admitting: Family

## 2021-07-30 ENCOUNTER — Telehealth: Payer: Self-pay

## 2021-07-30 NOTE — Telephone Encounter (Signed)
We received fax from [employer] that [place] for Ambien was approved 07/30/21-07/30/24.

## 2021-08-08 ENCOUNTER — Encounter: Payer: Self-pay | Admitting: Primary Care

## 2021-08-08 ENCOUNTER — Other Ambulatory Visit: Payer: Self-pay

## 2021-08-08 ENCOUNTER — Ambulatory Visit: Payer: 59 | Admitting: Primary Care

## 2021-08-08 VITALS — BP 150/80 | HR 87 | Temp 96.9°F | Ht 65.0 in | Wt 232.4 lb

## 2021-08-08 DIAGNOSIS — G4730 Sleep apnea, unspecified: Secondary | ICD-10-CM

## 2021-08-08 DIAGNOSIS — G4700 Insomnia, unspecified: Secondary | ICD-10-CM | POA: Diagnosis not present

## 2021-08-08 DIAGNOSIS — G4733 Obstructive sleep apnea (adult) (pediatric): Secondary | ICD-10-CM

## 2021-08-08 NOTE — Progress Notes (Signed)
@Patient  ID: Tammie Robinson, female    DOB: [birth year], 57 y.o.   MRN: [id number]  No chief complaint on file.   Referring provider: Burnard Hawthorne, FNP  HPI: 56 year old female, never smoked. PMH significant for hypertension, rectal cancer, murmur, obesity.  Previous [facility] pulmonary encounter: 05/23/2021- Sleep consult  Patient presents today for sleep consult. She has a hard time falling and staying asleep. She was on Ambien CR 6.25 which did not help. She was recently changed to Ambien 10mg . She wakes up an hour or two after falling asleep. She probably gets a total of 4 hours of sleep a night. She has been told by her husband that she snores. She cares for two family members during the day, she does not get any formal exercise. Denies symptoms of sleep walking, cataplexy, narcolepsy.   Sleep questionnaire Prior sleep study- None  Symptoms- Insomnia, restless sleep, loud snoring, daytime fatigue  Bedtime-10pm-12am Take it takes to fall asleep- A long time  Nocturnal awakenings- once after initially fallings asleep Time out of bed in morning- 6am Weight changes- up and down Epworth- 7  08/08/2021 - Interim hx  Patient presents today to review sleep study results and discuss treatment options. She is doing well. Currently taking Ambien 10mg  at bedtime which seems to be working well for her. It takes her 30 mins to fall asleep. For the most part she is able to sleep through the night except for the last couple of nights she has been waking up at 4:30. HST on 07/16/21 showed mild OSA, AHI 8.7/hr with SpO2 low 83% (average 93%). We reviewed results with patient and treatment options including weight loss, side sleeping position, oral appliance, CPAP and ENT referral. She is not interested in oral appliance or possible surgical options. She would like to try CPAP device.    Allergies  Allergen Reactions   Lisinopril Hives and Rash    Immunization History  Administered Date(s)  Administered   Influenza Inj Mdck Quad With Preservative 06/10/2017   Influenza Split 04/14/2012   Influenza, Seasonal, Injecte, Preservative Fre 05/09/2009   Influenza,inj,Quad PF,6+ Mos 05/01/2015, 04/24/2021   Influenza-Unspecified 06/10/2017, 05/11/2018, 05/06/2019, 07/07/2020   PFIZER(Purple Top)SARS-COV-2 Vaccination 10/22/2019, 11/15/2019, 07/09/2020   Tdap 04/14/2012, 08/30/2020    Past Medical History:  Diagnosis Date   Anemia    prior to CA dx   Asthma    seasonal,never hospitalized   Blood in stool    Colostomy in place [facility])    History of ERCP    Hypertension    Migraine    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 1 Inhaler 3   amLODipine (NORVASC) 10 MG tablet TAKE ONE TABLET BY MOUTH ONCE DAILY 90 tablet 0   azelastine (ASTELIN) 0.1 % nasal spray Place 1 spray into both nostrils 2 (two) times daily. Use in each nostril as directed 30 mL 4   budesonide-formoterol (SYMBICORT) 80-4.5 MCG/ACT inhaler Inhale 2  puffs into the lungs 2 (two) times daily.     buPROPion (WELLBUTRIN XL) 150 MG 24 hr tablet TAKE TWO TABLETS BY MOUTH EVERY MORNING 60 tablet 3   escitalopram (LEXAPRO) 10 MG tablet TAKE ONE TABLET BY MOUTH ONCE DAILY 90 tablet 1   gabapentin (NEURONTIN) 300 MG capsule Take 600mg  qam, 300mg  midday and 600mg  po qpm. 150 capsule 5   losartan (COZAAR) 25 MG tablet Take 1.5 tablets (37.5 mg total) by mouth daily. Take 1.5 tablet daily. 180 tablet 2   zolpidem (AMBIEN) 10 MG tablet Take  1 tablet (10 mg total) by mouth at bedtime as needed for sleep. 30 tablet 2   Facility-Administered Medications Prior to Visit  Medication Dose Route Frequency Provider Last Rate Last Admin   heparin lock flush 100 unit/mL  500 Units Intravenous Once Leia Alf, MD       sodium chloride 0.9 % injection 10 mL  10 mL Intravenous PRN Leia Alf, MD       sodium chloride 0.9 % injection 10 mL  10 mL Intravenous PRN Leia Alf, MD   10 mL at 11/28/14 1120   sodium chloride flush (NS) 0.9 % injection 10 mL  10 mL Intravenous PRN Evlyn Kanner, NP   10 mL at 01/03/16 1413   sodium chloride flush (NS) 0.9 % injection 10 mL  10 mL Intravenous PRN Lloyd Huger, MD   10 mL at 11/15/16 1111   sodium chloride flush (NS) 0.9 % injection 10 mL  10 mL Intravenous PRN Cammie Sickle, MD   10 mL at 11/24/17 1434   Review of Systems  Review of Systems  Constitutional: Negative.   HENT: Negative.    Respiratory: Negative.    Psychiatric/Behavioral: Negative.      Physical Exam  BP (!) 150/80 (BP Location: Left Arm, Patient Position: Sitting, Cuff Size: Normal)    Pulse 87    Temp (!) 96.9 F (36.1 C) (Oral)    Ht 5\' 5"  (1.651 m)    Wt 232 lb 6.4 oz (105.4 kg)    LMP 12/11/2013    SpO2 97%    BMI 38.67 kg/m  Physical Exam Constitutional:      Appearance: Normal appearance.  HENT:     Head: Normocephalic and atraumatic.     Mouth/Throat:     Mouth: Mucous membranes are moist.     Pharynx: Oropharynx is clear.  Cardiovascular:     Rate and Rhythm: Normal rate and regular rhythm.  Pulmonary:     Breath sounds: Normal breath sounds.  Musculoskeletal:        General: Normal range of motion.  Skin:    General: Skin is warm and dry.  Neurological:     General: No focal deficit present.     Mental Status: She is alert and oriented to person, place, and time. Mental status is at baseline.  Psychiatric:        Mood and Affect: Mood normal.        Behavior: Behavior  normal.        Thought Content: Thought content normal.        Judgment: Judgment normal.     Lab Results:  CBC    Component Value Date/Time   WBC 6.6 03/27/2021 0803   RBC 4.45 03/27/2021 0803   HGB 11.8 (L) 03/27/2021 0803   HGB 12.4 11/14/2014 1013   HCT 36.1 03/27/2021 0803   HCT 37.4 11/14/2014 1013   PLT 197.0  03/27/2021 0803   PLT 69 (L) 11/14/2014 1013   MCV 81.0 03/27/2021 0803   MCV 87 11/14/2014 1013   MCH 28.6 01/28/2020 1302   MCHC 32.8 03/27/2021 0803   RDW 13.4 03/27/2021 0803   RDW 16.1 (H) 11/14/2014 1013   LYMPHSABS 1.5 03/27/2021 0803   LYMPHSABS 0.4 (L) 11/14/2014 1013   MONOABS 0.5 03/27/2021 0803   MONOABS 1.1 (H) 11/14/2014 1013   EOSABS 0.3 03/27/2021 0803   EOSABS 0.2 11/14/2014 1013   BASOSABS 0.0 03/27/2021 0803   BASOSABS 0.0 11/14/2014 1013    BMET    Component Value Date/Time   NA 141 05/25/2021 0752   NA 144 08/29/2014 0908   K 4.2 05/25/2021 0752   K 3.3 (L) 08/29/2014 0908   CL 106 05/25/2021 0752   CL 107 08/29/2014 0908   CO2 29 05/25/2021 0752   CO2 29 08/29/2014 0908   GLUCOSE 97 05/25/2021 0752   GLUCOSE 88 08/29/2014 0908   BUN 16 05/25/2021 0752   BUN 18 08/29/2014 0908   CREATININE 0.73 05/25/2021 0752   CREATININE 0.52 11/07/2014 0902   CALCIUM 8.9 05/25/2021 0752   CALCIUM 8.4 (L) 08/29/2014 0908   GFRNONAA 56 (L) 01/28/2020 1302   GFRNONAA >60 11/07/2014 0902   GFRAA >60 01/28/2020 1302   GFRAA >60 11/07/2014 0902    BNP No results found for: BNP  ProBNP No results found for: PROBNP  Imaging: No results found.   Assessment & Plan:   Mild sleep apnea - HST on 07/16/21 showed mild OSA, AHI 8.7/hr with SpO2 low 83% (average 93%) - Discussed treatment options, patient would like to try CPAP. We will place an order for auto CPAP 5-15cm h20 - Instructed she aim to wear CPAP every night for min 4-6 hours or longer. Advised she work on weight loss and avoid driving when experiencing excessive daytime  fatigue.  - FU in 3 months or sooner if needed   Insomnia - Improved; Continue Ambien 10mg  at bedtime for insomnia    Martyn Ehrich, NP 08/14/2021

## 2021-08-08 NOTE — Patient Instructions (Addendum)
Home sleep study showed mild obstructive sleep apnea, you had on average 8.7 apneic/hypopneic events an hour  Recommendations: Aim to wear CPAP every night for 4-6 hours or longer  Work on weight loss efforts Do not drive if experiencing excessive daytime sleepiness Wash tubing daily and water chamber weekly and change mask/filters/tubing every 3 months   Orders: New CPAP start auto titrate 5-15cm h20 with mask of choice, supplies and enroll in airview   Follow-up: Once you receive CPAP please call to set up follow-up for compliance check    CPAP and BIPAP Information CPAP and BIPAP are methods that use air pressure to keep your airways open and to help you breathe well. CPAP and BIPAP use different amounts of pressure. Your health care provider will tell you whether CPAP or BIPAP would be more helpful for you. CPAP stands for "continuous positive airway pressure." With CPAP, the amount of pressure stays the same while you breathe in (inhale) and out (exhale). BIPAP stands for "bi-level positive airway pressure." With BIPAP, the amount of pressure will be higher when you inhale and lower when you exhale. This allows you to take larger breaths. CPAP or BIPAP may be used in the hospital, or your health care provider may want you to use it at home. You may need to have a sleep study before your health care provider can order a machine for you to use at home. What are the advantages? CPAP or BIPAP can be helpful if you have: Sleep apnea. Chronic obstructive pulmonary disease (COPD). Heart failure. Medical conditions that cause muscle weakness, including muscular dystrophy or amyotrophic lateral sclerosis (ALS). Other problems that cause breathing to be shallow, weak, abnormal, or difficult. CPAP and BIPAP are most commonly used for obstructive sleep apnea (OSA) to keep the airways from collapsing when the muscles relax during sleep. What are the risks? Generally, this is a safe treatment.  However, problems may occur, including: Irritated skin or skin sores if the mask does not fit properly. Dry or stuffy nose or nosebleeds. Dry mouth. Feeling gassy or bloated. Sinus or lung infection if the equipment is not cleaned properly. When should CPAP or BIPAP be used? In most cases, the mask only needs to be worn during sleep. Generally, the mask needs to be worn throughout the night and during any daytime naps. People with certain medical conditions may also need to wear the mask at other times, such as when they are awake. Follow instructions from your health care provider about when to use the machine. What happens during CPAP or BIPAP? Both CPAP and BIPAP are provided by a small machine with a flexible plastic tube that attaches to a plastic mask that you wear. Air is blown through the mask into your nose or mouth. The amount of pressure that is used to blow the air can be adjusted on the machine. Your health care provider will set the pressure setting and help you find the best mask for you. Tips for using the mask Because the mask needs to be snug, some people feel trapped or closed-in (claustrophobic) when first using the mask. If you feel this way, you may need to get used to the mask. One way to do this is to hold the mask loosely over your nose or mouth and then gradually apply the mask more snugly. You can also gradually increase the amount of time that you use the mask. Masks are available in various types and sizes. If your mask does not  fit well, talk with your health care provider about getting a different one. Some common types of masks include: Full face masks, which fit over the mouth and nose. Nasal masks, which fit over the nose. Nasal pillow or prong masks, which fit into the nostrils. If you are using a mask that fits over your nose and you tend to breathe through your mouth, a chin strap may be applied to help keep your mouth closed. Use a skin barrier to protect your  skin as told by your health care provider. Some CPAP and BIPAP machines have alarms that may sound if the mask comes off or develops a leak. If you have trouble with the mask, it is very important that you talk with your health care provider about finding a way to make the mask easier to tolerate. Do not stop using the mask. There could be a negative impact on your health if you stop using the mask. Tips for using the machine Place your CPAP or BIPAP machine on a secure table or stand near an electrical outlet. Know where the on/off switch is on the machine. Follow instructions from your health care provider about how to set the pressure on your machine and when you should use it. Do not eat or drink while the CPAP or BIPAP machine is on. Food or fluids could get pushed into your lungs by the pressure of the CPAP or BIPAP. For home use, CPAP and BIPAP machines can be rented or purchased through home health care companies. Many different brands of machines are available. Renting a machine before purchasing may help you find out which particular machine works well for you. Your health insurance company may also decide which machine you may get. Keep the CPAP or BIPAP machine and attachments clean. Ask your health care provider for specific instructions. Check the humidifier if you have a dry stuffy nose or nosebleeds. Make sure it is working correctly. Follow these instructions at home: Take over-the-counter and prescription medicines only as told by your health care provider. Ask if you can take sinus medicine if your sinuses are blocked. Do not use any products that contain nicotine or tobacco. These products include cigarettes, chewing tobacco, and vaping devices, such as e-cigarettes. If you need help quitting, ask your health care provider. Keep all follow-up visits. This is important. Contact a health care provider if: You have redness or pressure sores on your head, face, mouth, or nose from the  mask or head gear. You have trouble using the CPAP or BIPAP machine. You cannot tolerate wearing the CPAP or BIPAP mask. Someone tells you that you snore even when wearing your CPAP or BIPAP. Get help right away if: You have trouble breathing. You feel confused. Summary CPAP and BIPAP are methods that use air pressure to keep your airways open and to help you breathe well. If you have trouble with the mask, it is very important that you talk with your health care provider about finding a way to make the mask easier to tolerate. Do not stop using the mask. There could be a negative impact to your health if you stop using the mask. Follow instructions from your health care provider about when to use the machine. This information is not intended to replace advice given to you by your health care provider. Make sure you discuss any questions you have with your health care provider. Document Revised: 02/14/2021 Document Reviewed: 06/16/2020 Elsevier Patient Education  2022 [employer].

## 2021-08-10 ENCOUNTER — Other Ambulatory Visit: Payer: Self-pay

## 2021-08-10 DIAGNOSIS — R748 Abnormal levels of other serum enzymes: Secondary | ICD-10-CM

## 2021-08-10 DIAGNOSIS — R7989 Other specified abnormal findings of blood chemistry: Secondary | ICD-10-CM

## 2021-08-14 DIAGNOSIS — G4730 Sleep apnea, unspecified: Secondary | ICD-10-CM | POA: Insufficient documentation

## 2021-08-14 NOTE — Assessment & Plan Note (Signed)
-   HST on 07/16/21 showed mild OSA, AHI 8.7/hr with SpO2 low 83% (average 93%) - Discussed treatment options, patient would like to try CPAP. We will place an order for auto CPAP 5-15cm h20 - Instructed she aim to wear CPAP every night for min 4-6 hours or longer. Advised she work on weight loss and avoid driving when experiencing excessive daytime fatigue.  - FU in 3 months or sooner if needed

## 2021-08-14 NOTE — Assessment & Plan Note (Signed)
-   Improved; Continue Ambien 10mg  at bedtime for insomnia

## 2021-08-14 NOTE — Progress Notes (Signed)
Reviewed and agree with assessment/plan.   Chesley Mires, MD [facility] Pulmonary/Critical Care 08/14/2021, 9:07 AM Pager:  [phone]

## 2021-08-15 ENCOUNTER — Other Ambulatory Visit: Payer: Self-pay | Admitting: Family

## 2021-08-15 DIAGNOSIS — G4700 Insomnia, unspecified: Secondary | ICD-10-CM

## 2021-08-15 DIAGNOSIS — I10 Essential (primary) hypertension: Secondary | ICD-10-CM

## 2021-08-16 ENCOUNTER — Other Ambulatory Visit: Payer: Self-pay

## 2021-08-19 ENCOUNTER — Encounter: Payer: Self-pay | Admitting: Emergency Medicine

## 2021-08-19 ENCOUNTER — Telehealth: Payer: 59 | Admitting: Emergency Medicine

## 2021-08-19 DIAGNOSIS — B9789 Other viral agents as the cause of diseases classified elsewhere: Secondary | ICD-10-CM

## 2021-08-19 DIAGNOSIS — J329 Chronic sinusitis, unspecified: Secondary | ICD-10-CM

## 2021-08-19 MED ORDER — AZELASTINE HCL 0.1 % NA SOLN: 1.0000 | Freq: Two times a day (BID) | NASAL | 12 refills | Status: DC

## 2021-08-19 NOTE — Progress Notes (Signed)
E-Visit for Sinus Problems  We are sorry that you are not feeling well.  Here is how we plan to help!  Based on what you have shared with me it looks like you have sinusitis.  Sinusitis is inflammation and infection in the sinus cavities of the head.  Based on your presentation I believe you most likely have Acute Viral Sinusitis.This is an infection most likely caused by a virus. There is not specific treatment for viral sinusitis other than to help you with the symptoms until the infection runs its course.  You may use an oral decongestant such as Mucinex D or if you have glaucoma or high blood pressure use plain Mucinex. Saline nasal spray help and can safely be used as often as needed for congestion, I have prescribed: Azelastine nasal spray 2 sprays in each nostril twice a day  Some authorities believe that zinc sprays or the use of Echinacea may shorten the course of your symptoms.  Sinus infections are not as easily transmitted as other respiratory infection, however we still recommend that you avoid close contact with loved ones, especially the very young and elderly.  Remember to wash your hands thoroughly throughout the day as this is the number one way to prevent the spread of infection!  Home Care: Only take medications as instructed by your medical team. Do not take these medications with alcohol. A steam or ultrasonic humidifier can help congestion.  You can place a towel over your head and breathe in the steam from hot water coming from a faucet. Avoid close contacts especially the very young and the elderly. Cover your mouth when you cough or sneeze. Always remember to wash your hands.  Get Help Right Away If: You develop worsening fever or sinus pain. You develop a severe head ache or visual changes. Your symptoms persist after you have completed your treatment plan.  Make sure you Understand these instructions. Will watch your condition. Will get help right away if you are  not doing well or get worse.   Thank you for choosing an e-visit.  Your e-visit answers were reviewed by a board certified advanced clinical practitioner to complete your personal care plan. Depending upon the condition, your plan could have included both over the counter or prescription medications.  Please review your pharmacy choice. Make sure the pharmacy is open so you can pick up prescription now. If there is a problem, you may contact your provider through MyChart messaging and have the prescription routed to another pharmacy.  Your safety is important to us. If you have drug allergies check your prescription carefully.   For the next 24 hours you can use MyChart to ask questions about today's visit, request a non-urgent call back, or ask for a work or school excuse. You will get an email in the next two days asking about your experience. I hope that your e-visit has been valuable and will speed your recovery.   

## 2021-08-19 NOTE — Progress Notes (Signed)
I have spent 5 minutes in review of e-visit questionnaire, review and updating patient chart, medical decision making and response to patient.   Caroleen Stoermer, PA-C    

## 2021-08-29 ENCOUNTER — Ambulatory Visit (INDEPENDENT_AMBULATORY_CARE_PROVIDER_SITE_OTHER): Payer: 59 | Admitting: Family

## 2021-08-29 ENCOUNTER — Other Ambulatory Visit: Payer: Self-pay

## 2021-08-29 ENCOUNTER — Encounter: Payer: Self-pay | Admitting: Family

## 2021-08-29 ENCOUNTER — Telehealth: Payer: Self-pay | Admitting: Family

## 2021-08-29 VITALS — BP 130/64 | HR 81 | Temp 98.8°F | Ht 65.0 in | Wt 234.6 lb

## 2021-08-29 DIAGNOSIS — F419 Anxiety disorder, unspecified: Secondary | ICD-10-CM

## 2021-08-29 DIAGNOSIS — I10 Essential (primary) hypertension: Secondary | ICD-10-CM

## 2021-08-29 DIAGNOSIS — G4700 Insomnia, unspecified: Secondary | ICD-10-CM | POA: Diagnosis not present

## 2021-08-29 DIAGNOSIS — E041 Nontoxic single thyroid nodule: Secondary | ICD-10-CM | POA: Diagnosis not present

## 2021-08-29 DIAGNOSIS — R69 Illness, unspecified: Secondary | ICD-10-CM | POA: Diagnosis not present

## 2021-08-29 DIAGNOSIS — R748 Abnormal levels of other serum enzymes: Secondary | ICD-10-CM | POA: Diagnosis not present

## 2021-08-29 DIAGNOSIS — E538 Deficiency of other specified B group vitamins: Secondary | ICD-10-CM | POA: Diagnosis not present

## 2021-08-29 DIAGNOSIS — R7989 Other specified abnormal findings of blood chemistry: Secondary | ICD-10-CM | POA: Diagnosis not present

## 2021-08-29 DIAGNOSIS — F32A Depression, unspecified: Secondary | ICD-10-CM

## 2021-08-29 DIAGNOSIS — Z13820 Encounter for screening for osteoporosis: Secondary | ICD-10-CM

## 2021-08-29 NOTE — Assessment & Plan Note (Signed)
Chronic, stable.  Especially pleased with average blood pressure readings at home.  Continue losartan 37.5mg  QD, amlodipine 10mg 

## 2021-08-29 NOTE — Assessment & Plan Note (Signed)
B12 responded supplementation.  Advised patient that she may stop B12 injections and that she may take a women's multivitamin which includes B12

## 2021-08-29 NOTE — Patient Instructions (Addendum)
For b12, may take 1030mcg daily in lieu of the injections here.   For wellbutrin, start one tablet ( 150mg ) every morning tomorrow for 3-5 days, then stop completely.   We are working diligently on getting you an endocrine appointment with Dr. Loanne Drilling.   Also placed a referral general surgery, Dr. Armandina Gemma.  Please call us in 1 week if you not heard in this regard.

## 2021-08-29 NOTE — Telephone Encounter (Signed)
looks like Dr Loanne Drilling from endo approved ref.   Please call his office to see if we can get her scheduled   Let me know

## 2021-08-29 NOTE — Assessment & Plan Note (Signed)
Chronic, stable.  We agreed to wean off of Wellbutrin and I provided her with detail in how to do this.  She will continue Lexapro 10 mg

## 2021-08-29 NOTE — Assessment & Plan Note (Signed)
Unfortunately patient was not contact for endocrine referral.  Thyroid ultrasound revealed thyroid nodules which require biopsy.  Referral to general surgery Dr. Armandina Gemma for thyroid nodule.  Separately I also placed a referral to [facility] endocrinology to see if patient can be seen sooner  pending DEXA

## 2021-08-29 NOTE — Assessment & Plan Note (Signed)
Chronic, stable.  Continue Ambien 10 mg 

## 2021-08-29 NOTE — Progress Notes (Signed)
Subjective:    Patient ID: Tammie Robinson, female    DOB: 05-Oct-1965, 56 y.o.   MRN: [id number]  CC: Tammie Robinson is a 56 y.o. female who presents today for follow up.   HPI: Feels well today No complaints   Anxiety and depression-she feels well on Lexapro 10 mg.  She does not think she needs Wellbutrin anymore and she is interested in weaning off of this medication  Insomnia-compliant with Ambien 10 mg.  This medication has also been helpful for her  HTN-compliant with losartan 37.5mg  QD, amlodipine 10mg . BP at home 145/70, 120/60, 134/64, 132/57. No cp.   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 Jamal Collin   COLONOSCOPY WITH PROPOFOL N/A 01/26/2016   Procedure: COLONOSCOPY WITH PROPOFOL through colostomy;  Surgeon: Lucilla Lame, MD;  Location: [place];  Service: Endoscopy;  Laterality: N/A;  port-a-cath   COLONOSCOPY WITH PROPOFOL N/A 11/29/2019   Procedure: COLONOSCOPY WITH PROPOFOL;  Surgeon: Lucilla Lame, MD;  Location: [place];  Service: Endoscopy;  Laterality: N/A;   POLYPECTOMY  01/26/2016   Procedure: POLYPECTOMY;  Surgeon: Lucilla Lame, MD;  Location: [place];  Service: Endoscopy;;   POLYPECTOMY N/A 11/29/2019   Procedure: POLYPECTOMY;  Surgeon: Lucilla Lame, MD;  Location: [place];  Service: Endoscopy;   Laterality: N/A;   PORTA CATH REMOVAL N/A 01/28/2018   Procedure: PORTA CATH REMOVAL;  Surgeon: Algernon Huxley, MD;  Location: [place] [facility];  Service: Cardiovascular;  Laterality: N/A;   PORTACATH PLACEMENT     PROCTECTOMY  04/12/14   [facility] - CA surgery - with colostomy   SALPINGOOPHORECTOMY Bilateral 04/12/14   [facility] - with CA surg   VAGINA RECONSTRUCTION SURGERY  04/12/14   [facility] - transabdonimal rectus abdominus muscle reconstruction   VAGINAL DELIVERY     2, preterm labor   Family History  Problem Relation Age of Onset   Arthritis Mother    Stroke Mother    Hypertension Mother    Diabetes Mother    Arthritis Father    Heart disease Father 35       s/p CABG   Stroke Father    Hypertension Father    Breast cancer Paternal Aunt 85   Alcohol abuse Maternal Grandmother    Diabetes Maternal Grandmother    Alcohol abuse Maternal Grandfather    Diabetes Maternal Grandfather    Breast cancer Paternal Grandmother    Cancer Paternal Grandmother        breast   Heart disease Paternal Grandfather    Colon cancer Neg Hx     Allergies: Lisinopril Current Outpatient Medications on File Prior to Visit  Medication Sig Dispense Refill   albuterol (  PROVENTIL HFA;VENTOLIN HFA) 108 (90 Base) MCG/ACT inhaler Inhale 1-2 puffs into the lungs every 4 (four) hours as needed for wheezing or shortness of breath. 1 Inhaler 3   amLODipine (NORVASC) 10 MG tablet TAKE ONE TABLET BY MOUTH ONCE DAILY 90 tablet 0   azelastine (ASTELIN) 0.1 % nasal spray Place 1 spray into both nostrils 2 (two) times daily. Use in each nostril as directed 30 mL 12   budesonide-formoterol (SYMBICORT) 80-4.5 MCG/ACT inhaler Inhale 2 puffs into the lungs 2 (two) times daily.     escitalopram (LEXAPRO) 10 MG tablet TAKE ONE TABLET BY MOUTH ONCE DAILY 90 tablet 1   gabapentin (NEURONTIN) 300 MG capsule Take 600mg  qam, 300mg  midday and 600mg  po qpm. 150 capsule 5   losartan (COZAAR) 25 MG tablet  Take 1.5 tablets (37.5 mg total) by mouth daily. Take 1.5 tablet daily. 180 tablet 2   zolpidem (AMBIEN) 10 MG tablet TAKE ONE TABLET BY MOUTH AT BEDTIME AS NEEDED FOR SLEEP 30 tablet 2   Current Facility-Administered Medications on File Prior to Visit  Medication Dose Route Frequency Provider Last Rate Last Admin   heparin lock flush 100 unit/mL  500 Units Intravenous Once Tammie Alf, MD       sodium chloride 0.9 % injection 10 mL  10 mL Intravenous PRN Tammie Alf, MD       sodium chloride 0.9 % injection 10 mL  10 mL Intravenous PRN Tammie Alf, MD   10 mL at 11/28/14 1120   sodium chloride flush (NS) 0.9 % injection 10 mL  10 mL Intravenous PRN Tammie Kanner, NP   10 mL at 01/03/16 1413   sodium chloride flush (NS) 0.9 % injection 10 mL  10 mL Intravenous PRN Tammie Huger, MD   10 mL at 11/15/16 1111   sodium chloride flush (NS) 0.9 % injection 10 mL  10 mL Intravenous PRN Tammie Sickle, MD   10 mL at 11/24/17 1434    Social History   Tobacco Use   Smoking status: Never   Smokeless tobacco: Never  Vaping Use   Vaping Use: Never used  Substance Use Topics   Alcohol use: No    Comment: rare - holidays   Drug use: No    Review of Systems  Constitutional:  Negative for chills and fever.  Respiratory:  Negative for cough.   Cardiovascular:  Negative for chest pain and palpitations.  Gastrointestinal:  Negative for nausea and vomiting.     Objective:    BP 130/64 (BP Location: Left Arm, Patient Position: Sitting, Cuff Size: Large)    Pulse 81    Temp 98.8 F (37.1 C) (Oral)    Ht 5\' 5"  (1.651 m)    Wt 234 lb 9.6 oz (106.4 kg)    LMP 12/11/2013    SpO2 96%    BMI 39.04 kg/m  BP Readings from Last 3 Encounters:  08/29/21 130/64  08/08/21 (!) 150/80  05/23/21 140/60   Wt Readings from Last 3 Encounters:  08/29/21 234 lb 9.6 oz (106.4 kg)  08/08/21 232 lb 6.4 oz (105.4 kg)  05/23/21 232 lb 6.4 oz (105.4 kg)    Physical Exam Vitals  reviewed.  Constitutional:      Appearance: She is well-developed.  Eyes:     Conjunctiva/sclera: Conjunctivae normal.  Cardiovascular:     Rate and Rhythm: Normal rate and regular rhythm.     Pulses: Normal pulses.     Heart sounds: Normal heart sounds.  Pulmonary:     Effort: Pulmonary effort is normal.     Breath sounds: Normal breath sounds. No wheezing, rhonchi or rales.  Skin:    General: Skin is warm and dry.  Neurological:     Mental Status: She is alert.  Psychiatric:        Speech: Speech normal.        Behavior: Behavior normal.        Thought Content: Thought content normal.       Assessment & Plan:   Problem List Items Addressed This Visit       Cardiovascular and Mediastinum   Hypertension    Chronic, stable.  Especially pleased with average blood pressure readings at home.  Continue losartan 37.5mg  QD, amlodipine 10mg         Endocrine   Thyroid nodule    Pending consult with general surgery, Tammie Robinson      Relevant Orders   Ambulatory referral to General Surgery     Other   Anxiety and depression    Chronic, stable.  We agreed to wean off of Wellbutrin and I provided her with detail in how to do this.  She will continue Lexapro 10 mg      B12 deficiency    B12 responded supplementation.  Advised patient that she may stop B12 injections and that she may take a women's multivitamin which includes B12      Elevated alkaline phosphatase level    Unfortunately patient was not contact for endocrine referral.  Thyroid ultrasound revealed thyroid nodules which require biopsy.  Referral to general surgery Tammie Robinson for thyroid nodule.  Separately I also placed a referral to [facility] endocrinology to see if patient can be seen sooner  pending DEXA      Relevant Orders   DG Bone Density   Insomnia    Chronic, stable.  Continue Ambien 10 mg      Low TSH level    Pending endocrine referral.  Call out to endocrine to check status of appointment       Other Visit Diagnoses     Screening for osteoporosis    -  Primary   Relevant Orders   DG Bone Density        I have discontinued Tammie Robinson. Tammie Robinson. I am also having her maintain her albuterol, budesonide-formoterol, gabapentin, escitalopram, losartan, zolpidem, amLODipine, and azelastine.   No orders of the defined types were placed in this encounter.   Return precautions given.   Risks, benefits, and alternatives of the medications and treatment plan prescribed today were discussed, and patient expressed understanding.   Education regarding symptom management and diagnosis given to patient on AVS.  Continue to follow with Tammie Hawthorne, Tammie Robinson for routine health maintenance.   Tammie Robinson and I agreed with plan.   Tammie Paris, Tammie Robinson

## 2021-08-29 NOTE — Assessment & Plan Note (Signed)
Pending endocrine referral.  Call out to endocrine to check status of appointment

## 2021-08-29 NOTE — Assessment & Plan Note (Signed)
Pending consult with general surgery, Dr. Harlow Asa

## 2021-09-03 NOTE — Telephone Encounter (Signed)
LMTCB or mychart that she got message with appointment date & time for endocrinology appointment.

## 2021-09-04 ENCOUNTER — Other Ambulatory Visit: Payer: Self-pay | Admitting: Family

## 2021-09-04 DIAGNOSIS — C20 Malignant neoplasm of rectum: Secondary | ICD-10-CM

## 2021-09-06 ENCOUNTER — Encounter: Payer: Self-pay | Admitting: Family

## 2021-09-13 ENCOUNTER — Encounter: Payer: Self-pay | Admitting: Endocrinology

## 2021-09-13 ENCOUNTER — Other Ambulatory Visit: Payer: Self-pay

## 2021-09-13 ENCOUNTER — Ambulatory Visit: Payer: 59 | Admitting: Endocrinology

## 2021-09-13 VITALS — BP 148/80 | HR 87 | Ht 65.0 in | Wt 227.0 lb

## 2021-09-13 DIAGNOSIS — R7989 Other specified abnormal findings of blood chemistry: Secondary | ICD-10-CM

## 2021-09-13 LAB — TSH: TSH: 0 u[IU]/mL — ABNORMAL LOW (ref 0.35–5.50)

## 2021-09-13 LAB — T4, FREE: Free T4: 4.63 ng/dL — ABNORMAL HIGH (ref 0.60–1.60)

## 2021-09-13 MED ORDER — METHIMAZOLE 10 MG PO TABS
40.0000 mg | ORAL_TABLET | Freq: Two times a day (BID) | ORAL | 3 refills | Status: DC
Start: 2021-09-13 — End: 2021-09-14

## 2021-09-13 NOTE — Progress Notes (Signed)
Subjective:    Patient ID: Tammie Robinson, female    DOB: [birth year], 56 y.o.   MRN: [id number]  HPI Pt is referred by [name], NP, for hyperthyroidism.  Pt reports he was dx'ed with hyperthyroidism in 2022.  she has never been on therapy for this.  she has never had XRT to the anterior neck, or thyroid surgery.  she has never had thyroid imaging.  she does not consume non-prescribed thyroid medication.  she has never been on amiodarone.  She has sweating, tremor, and heat intolerance.   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 Jamal Collin   COLONOSCOPY WITH PROPOFOL N/A 01/26/2016   Procedure: COLONOSCOPY WITH PROPOFOL through colostomy;  Surgeon: Lucilla Lame, MD;  Location: [place];  Service: Endoscopy;  Laterality: N/A;  port-a-cath   COLONOSCOPY WITH PROPOFOL N/A 11/29/2019   Procedure: COLONOSCOPY WITH PROPOFOL;  Surgeon: Lucilla Lame, MD;  Location: [place];  Service: Endoscopy;  Laterality: N/A;   POLYPECTOMY  01/26/2016   Procedure: POLYPECTOMY;  Surgeon: Lucilla Lame, MD;  Location: [place];  Service: Endoscopy;;   POLYPECTOMY N/A 11/29/2019   Procedure: POLYPECTOMY;  Surgeon: Lucilla Lame, MD;  Location: [place];  Service: Endoscopy;  Laterality: N/A;   PORTA CATH REMOVAL N/A 01/28/2018   Procedure: PORTA CATH REMOVAL;  Surgeon: Algernon Huxley, MD;  Location: [place] [facility];  Service: Cardiovascular;  Laterality: N/A;   PORTACATH PLACEMENT      PROCTECTOMY  04/12/14   [facility] - CA surgery - with colostomy   SALPINGOOPHORECTOMY Bilateral 04/12/14   [facility] - with CA surg   VAGINA RECONSTRUCTION SURGERY  04/12/14   [facility] - transabdonimal rectus abdominus muscle reconstruction   VAGINAL DELIVERY     2, preterm labor    Social History   Socioeconomic History   Marital status: Married    Spouse name: Not on file   Number of children: Not on file   Years of education: Not on file   Highest education level: Not on file  Occupational History   Not on file  Tobacco Use   Smoking status: Never   Smokeless tobacco: Never  Vaping Use   Vaping Use: Never used  Substance and Sexual Activity   Alcohol use: No    Comment: rare - holidays   Drug use: No   Sexual activity: Not on file  Other Topics Concern   Not on file  Social History Narrative   Lives in [place] with husband, has 2 children, 1 in college at [employer].      One daughter with leukemia, in remission   Other daughter has DM I.          Work - Chiropractor   Diet - regular   Exercise - none recently   Social Determinants of  Health   Financial Resource Strain: Not on file  Food Insecurity: Not on file  Transportation Needs: Not on file  Physical Activity: Not on file  Stress: Not on file  Social Connections: Not on file  Intimate Partner Violence: Not on file    Current Outpatient Medications on File Prior to Visit  Medication Sig Dispense Refill   albuterol (PROVENTIL HFA;VENTOLIN HFA) 108 (90 Base) MCG/ACT inhaler Inhale 1-2 puffs into the lungs every 4 (four) hours as needed for wheezing or shortness of breath. 1 Inhaler 3   amLODipine (NORVASC) 10 MG tablet TAKE ONE TABLET BY MOUTH ONCE DAILY 90 tablet 0   azelastine (ASTELIN) 0.1 % nasal spray Place 1 spray into both nostrils 2 (two) times daily. Use in each nostril as directed 30 mL 12   budesonide-formoterol (SYMBICORT) 80-4.5 MCG/ACT inhaler Inhale 2 puffs into the lungs 2 (two) times daily.      escitalopram (LEXAPRO) 10 MG tablet TAKE ONE TABLET BY MOUTH ONCE DAILY 90 tablet 1   gabapentin (NEURONTIN) 300 MG capsule TAKE TWO CAPSULES BY MOUTH EVERY MORNING, ONE CAPSULE midday AND TWO CAPSULES EVERY EVENING 150 capsule 5   losartan (COZAAR) 25 MG tablet Take 1.5 tablets (37.5 mg total) by mouth daily. Take 1.5 tablet daily. 180 tablet 2   zolpidem (AMBIEN) 10 MG tablet TAKE ONE TABLET BY MOUTH AT BEDTIME AS NEEDED FOR SLEEP 30 tablet 2   Current Facility-Administered Medications on File Prior to Visit  Medication Dose Route Frequency Provider Last Rate Last Admin   heparin lock flush 100 unit/mL  500 Units Intravenous Once Leia Alf, MD       sodium chloride 0.9 % injection 10 mL  10 mL Intravenous PRN Leia Alf, MD       sodium chloride 0.9 % injection 10 mL  10 mL Intravenous PRN Leia Alf, MD   10 mL at 11/28/14 1120   sodium chloride flush (NS) 0.9 % injection 10 mL  10 mL Intravenous PRN Evlyn Kanner, NP   10 mL at 01/03/16 1413   sodium chloride flush (NS) 0.9 % injection 10 mL  10 mL Intravenous PRN Lloyd Huger, MD   10 mL at 11/15/16 1111   sodium chloride flush (NS) 0.9 % injection 10 mL  10 mL Intravenous PRN Cammie Sickle, MD   10 mL at 11/24/17 1434    Allergies  Allergen Reactions   Lisinopril Hives and Rash    Family History  Problem Relation Age of Onset   Thyroid disease Mother    Arthritis Mother    Stroke Mother    Hypertension Mother    Diabetes Mother    Arthritis Father    Heart disease Father 77       s/p CABG   Stroke Father    Hypertension Father    Alcohol abuse Maternal Grandmother    Diabetes Maternal Grandmother    Alcohol abuse Maternal Grandfather    Diabetes Maternal Grandfather    Breast cancer Paternal Grandmother    Cancer Paternal Grandmother        breast   Heart disease Paternal Grandfather    Breast cancer Paternal Aunt 44   Colon cancer Neg Hx     BP (!) 148/80 (BP Location: Left Arm,  Patient Position: Sitting, Cuff Size: Normal)    Pulse 87    Ht 5\' 5"  (1.651 m)    Wt 227 lb (103 kg)    LMP 12/11/2013  SpO2 96%    BMI 37.77 kg/m    Review of Systems denies weight loss, palpitations, sob, and anxiety.      Objective:   Physical Exam VS: see vs page GEN: no distress HEAD: head: no deformity eyes: no periorbital swelling, no proptosis external nose and ears are normal NECK: 4 cm right thyroid nodule is noted CHEST WALL: no deformity LUNGS: clear to auscultation CV: reg rate and rhythm, no murmur.  MUSCULOSKELETAL: gait is normal and steady EXTEMITIES: no deformity.  no leg edema NEURO:  readily moves all 4's.  sensation is intact to touch on all 4's.  Slight fine tremor of the hands.  SKIN:  Normal texture and temperature.  No rash or suspicious lesion is visible.  Slightly diaphoretic.   NODES:  None palpable at the neck PSYCH: alert, well-oriented.  Does not appear anxious nor depressed.    Lab Results  Component Value Date   ALT 19 03/27/2021   AST 14 03/27/2021   ALKPHOS 163 (H) 09/13/2021   BILITOT 0.5 03/27/2021    [place]: Borderline thyromegaly with findings suggestive of multinodular goiter. 2. Nodule #1 meets imaging criteria to recommend percutaneous sampling as indicated.  I have reviewed outside records, and summarized: Pt was noted to have abnormal TFT, and referred here.  HTN and anxiety were also addressed  Lab Results  Component Value Date   TSH 0.00 (L) 09/13/2021       Assessment & Plan:  Hyperthyroidism: uncontrolled Alev AP, uncertain if related to the above.   Patient Instructions  Blood tests are requested for you today.  We'll let you know about the results.  If the thyroid is overactive, I will prescribe for you a pill to lower it.  We'll recheck the alkaline phosphatase when the thyroid is better.   The nodule can be further evaluated with a biopsy or nuclear medicine scan.   Please come back for a follow-up appointment  in 2-3 weeks.

## 2021-09-13 NOTE — Patient Instructions (Addendum)
Blood tests are requested for you today.  We'll let you know about the results.  If the thyroid is overactive, I will prescribe for you a pill to lower it.  We'll recheck the alkaline phosphatase when the thyroid is better.   The nodule can be further evaluated with a biopsy or nuclear medicine scan.   Please come back for a follow-up appointment in 2-3 weeks.

## 2021-09-14 ENCOUNTER — Encounter: Payer: Self-pay | Admitting: Endocrinology

## 2021-09-14 MED ORDER — METHIMAZOLE 10 MG PO TABS: 40.0000 mg | ORAL_TABLET | Freq: Two times a day (BID) | ORAL | 3 refills | Status: DC

## 2021-09-16 LAB — ALKALINE PHOSPHATASE, ISOENZYMES
Alkaline Phosphatase: 163 IU/L — ABNORMAL HIGH (ref 44–121)
BONE FRACTION: 79 % — ABNORMAL HIGH (ref 14–68)
INTESTINAL FRAC.: 2 % (ref 0–18)
LIVER FRACTION: 19 % (ref 18–85)

## 2021-09-17 ENCOUNTER — Telehealth: Payer: Self-pay | Admitting: Family

## 2021-09-17 DIAGNOSIS — I10 Essential (primary) hypertension: Secondary | ICD-10-CM

## 2021-09-17 NOTE — Telephone Encounter (Signed)
Patient has a lab appt 09/18/2021, there are no orders in.

## 2021-09-17 NOTE — Addendum Note (Signed)
Addended by: Burnard Hawthorne on: 09/17/2021 01:03 PM   Modules accepted: Orders

## 2021-09-18 ENCOUNTER — Other Ambulatory Visit (INDEPENDENT_AMBULATORY_CARE_PROVIDER_SITE_OTHER): Payer: 59

## 2021-09-18 ENCOUNTER — Other Ambulatory Visit: Payer: Self-pay

## 2021-09-18 DIAGNOSIS — I10 Essential (primary) hypertension: Secondary | ICD-10-CM

## 2021-09-18 LAB — CBC WITH DIFFERENTIAL/PLATELET
Basophils Absolute: 0 10*3/uL (ref 0.0–0.1)
Basophils Relative: 0.6 % (ref 0.0–3.0)
Eosinophils Absolute: 0.3 10*3/uL (ref 0.0–0.7)
Eosinophils Relative: 5.5 % — ABNORMAL HIGH (ref 0.0–5.0)
HCT: 37.3 % (ref 36.0–46.0)
Hemoglobin: 12.2 g/dL (ref 12.0–15.0)
Lymphocytes Relative: 34.8 % (ref 12.0–46.0)
Lymphs Abs: 1.9 10*3/uL (ref 0.7–4.0)
MCHC: 32.8 g/dL (ref 30.0–36.0)
MCV: 81 fl (ref 78.0–100.0)
Monocytes Absolute: 0.5 10*3/uL (ref 0.1–1.0)
Monocytes Relative: 8.4 % (ref 3.0–12.0)
Neutro Abs: 2.8 10*3/uL (ref 1.4–7.7)
Neutrophils Relative %: 50.7 % (ref 43.0–77.0)
Platelets: 196 10*3/uL (ref 150.0–400.0)
RBC: 4.6 Mil/uL (ref 3.87–5.11)
RDW: 12.6 % (ref 11.5–15.5)
WBC: 5.5 10*3/uL (ref 4.0–10.5)

## 2021-09-18 LAB — BASIC METABOLIC PANEL
BUN: 15 mg/dL (ref 6–23)
CO2: 28 mEq/L (ref 19–32)
Calcium: 9.3 mg/dL (ref 8.4–10.5)
Chloride: 105 mEq/L (ref 96–112)
Creatinine, Ser: 0.61 mg/dL (ref 0.40–1.20)
GFR: 100.07 mL/min (ref >60.00)
Glucose, Bld: 99 mg/dL (ref 70–99)
Potassium: 4.4 mEq/L (ref 3.5–5.1)
Sodium: 140 mEq/L (ref 135–145)

## 2021-09-19 ENCOUNTER — Other Ambulatory Visit: Payer: Self-pay | Admitting: Family

## 2021-09-19 ENCOUNTER — Other Ambulatory Visit: Payer: Self-pay | Admitting: Endocrinology

## 2021-09-19 DIAGNOSIS — R899 Unspecified abnormal finding in specimens from other organs, systems and tissues: Secondary | ICD-10-CM

## 2021-09-19 MED ORDER — TRIAMCINOLONE ACETONIDE 0.1 % EX CREA: 1.0000 "application " | TOPICAL_CREAM | Freq: Four times a day (QID) | CUTANEOUS | 1 refills | Status: DC | PRN

## 2021-10-01 DIAGNOSIS — E0590 Thyrotoxicosis, unspecified without thyrotoxic crisis or storm: Secondary | ICD-10-CM | POA: Diagnosis not present

## 2021-10-01 DIAGNOSIS — E041 Nontoxic single thyroid nodule: Secondary | ICD-10-CM | POA: Diagnosis not present

## 2021-10-02 ENCOUNTER — Ambulatory Visit: Payer: 59 | Admitting: Endocrinology

## 2021-10-02 ENCOUNTER — Other Ambulatory Visit: Payer: Self-pay

## 2021-10-02 ENCOUNTER — Encounter: Payer: Self-pay | Admitting: Endocrinology

## 2021-10-02 VITALS — BP 146/74 | HR 74 | Ht 65.0 in | Wt 226.8 lb

## 2021-10-02 DIAGNOSIS — R748 Abnormal levels of other serum enzymes: Secondary | ICD-10-CM

## 2021-10-02 DIAGNOSIS — R7989 Other specified abnormal findings of blood chemistry: Secondary | ICD-10-CM | POA: Diagnosis not present

## 2021-10-02 LAB — TSH: TSH: <0.01 u[IU]/mL — ABNORMAL LOW (ref 0.35–5.50)

## 2021-10-02 LAB — T4, FREE: Free T4: 2.09 ng/dL — ABNORMAL HIGH (ref 0.60–1.60)

## 2021-10-02 NOTE — Progress Notes (Signed)
? ?Subjective:  ? ? Patient ID: ALEASE Robinson, female    DOB: 11-27-1965, 56 y.o.   MRN: [id number] ? ?HPI ?Pt returns for f/u of hyperthyroidism (dx'ed 2022: US showed MNG--bx advised; she was rx'ed tapazole).  Since on tapazole, pt states she feels only slightly better in general.  Itching persists.  She saw Dr Harlow Asa, who requested bx.   ?Past Medical History:  ?Diagnosis Date  ? Anemia   ? prior to CA dx  ? Asthma   ? seasonal,never hospitalized  ? Blood in stool   ? Colostomy in place [facility])   ? History of ERCP   ? Hypertension   ? Migraine   ? Neuropathy   ? 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 PTSD (post-traumatic stress disorder)   ? Rectal cancer ([place])   ? Systolic murmur   ? Wears contact lenses   ? ? ?Past Surgical History:  ?Procedure Laterality Date  ? ABDOMINAL HYSTERECTOMY  04/12/2014  ? [facility] with CA surgery; no cervix ( [facility] 01/2017)  ? CHOLECYSTECTOMY  2012  ? Dr. Jamal Collin  ? COLONOSCOPY WITH PROPOFOL N/A 01/26/2016  ? Procedure: COLONOSCOPY WITH PROPOFOL through colostomy;  Surgeon: Lucilla Lame, MD;  Location: [place];  Service: Endoscopy;  Laterality: N/A;  port-a-cath  ? COLONOSCOPY WITH PROPOFOL N/A 11/29/2019  ? Procedure: COLONOSCOPY WITH PROPOFOL;  Surgeon: Lucilla Lame, MD;  Location: [place];  Service: Endoscopy;  Laterality: N/A;  ? POLYPECTOMY  01/26/2016  ? Procedure: POLYPECTOMY;  Surgeon: Lucilla Lame, MD;  Location: [place];  Service: Endoscopy;;  ? POLYPECTOMY N/A 11/29/2019  ? Procedure: POLYPECTOMY;  Surgeon: Lucilla Lame, MD;  Location: [place];  Service: Endoscopy;  Laterality: N/A;  ? PORTA CATH REMOVAL N/A 01/28/2018  ? Procedure: PORTA CATH REMOVAL;  Surgeon: Algernon Huxley, MD;  Location: [place] [facility];  Service: Cardiovascular;  Laterality: N/A;  ? PORTACATH PLACEMENT    ? PROCTECTOMY  04/12/14  ? [facility] - CA surgery - with colostomy  ? SALPINGOOPHORECTOMY Bilateral 04/12/14  ? [facility] - with CA surg  ? VAGINA RECONSTRUCTION SURGERY  04/12/14   ? [facility] - transabdonimal rectus abdominus muscle reconstruction  ? VAGINAL DELIVERY    ? 2, preterm labor  ? ? ?Social History  ? ?Socioeconomic History  ? Marital status: Married  ?  Spouse name: Not on file  ? Number of children: Not on file  ? Years of education: Not on file  ? Highest education level: Not on file  ?Occupational History  ? Not on file  ?Tobacco Use  ? Smoking status: Never  ? Smokeless tobacco: Never  ?Vaping Use  ? Vaping Use: Never used  ?Substance and Sexual Activity  ? Alcohol use: No  ?  Comment: rare - holidays  ? Drug use: No  ? Sexual activity: Not on file  ?Other Topics Concern  ? Not on file  ?Social History Narrative  ? Lives in [place] with husband, has 2 children, 1 in college at [employer].  ?   ? One daughter with leukemia, in remission  ? Other daughter has DM I.   ?   ?   ? Work - Chiropractor  ? Diet - regular  ? Exercise - none recently  ? ?Social Determinants of Health  ? ?Financial Resource Strain: Not on file  ?Food Insecurity: Not on file  ?Transportation Needs: Not on file  ?Physical Activity: Not on file  ?Stress: Not on  file  ?Social Connections: Not on file  ?Intimate Partner Violence: Not on file  ? ? ?Current Outpatient Medications on File Prior to Visit  ?Medication Sig Dispense Refill  ? albuterol (PROVENTIL HFA;VENTOLIN HFA) 108 (90 Base) MCG/ACT inhaler Inhale 1-2 puffs into the lungs every 4 (four) hours as needed for wheezing or shortness of breath. 1 Inhaler 3  ? amLODipine (NORVASC) 10 MG tablet TAKE ONE TABLET BY MOUTH ONCE DAILY 90 tablet 0  ? azelastine (ASTELIN) 0.1 % nasal spray Place 1 spray into both nostrils 2 (two) times daily. Use in each nostril as directed 30 mL 12  ? budesonide-formoterol (SYMBICORT) 80-4.5 MCG/ACT inhaler Inhale 2 puffs into the lungs 2 (two) times daily.    ? escitalopram (LEXAPRO) 10 MG tablet TAKE ONE TABLET BY MOUTH ONCE DAILY 90 tablet 1  ? gabapentin (NEURONTIN) 300 MG capsule TAKE TWO CAPSULES BY MOUTH EVERY MORNING,  ONE CAPSULE midday AND TWO CAPSULES EVERY EVENING 150 capsule 5  ? losartan (COZAAR) 25 MG tablet Take 1.5 tablets (37.5 mg total) by mouth daily. Take 1.5 tablet daily. 180 tablet 2  ? methimazole (TAPAZOLE) 10 MG tablet Take 4 tablets (40 mg total) by mouth 2 (two) times daily. 240 tablet 3  ? triamcinolone cream (KENALOG) 0.1 % Apply 1 application topically 4 (four) times daily as needed. 80 g 1  ? zolpidem (AMBIEN) 10 MG tablet TAKE ONE TABLET BY MOUTH AT BEDTIME AS NEEDED FOR SLEEP 30 tablet 2  ? ?Current Facility-Administered Medications on File Prior to Visit  ?Medication Dose Route Frequency Provider Last Rate Last Admin  ? heparin lock flush 100 unit/mL  500 Units Intravenous Once Leia Alf, MD      ? sodium chloride 0.9 % injection 10 mL  10 mL Intravenous PRN Leia Alf, MD      ? sodium chloride 0.9 % injection 10 mL  10 mL Intravenous PRN Leia Alf, MD   10 mL at 11/28/14 1120  ? sodium chloride flush (NS) 0.9 % injection 10 mL  10 mL Intravenous PRN Evlyn Kanner, NP   10 mL at 01/03/16 1413  ? sodium chloride flush (NS) 0.9 % injection 10 mL  10 mL Intravenous PRN Lloyd Huger, MD   10 mL at 11/15/16 1111  ? sodium chloride flush (NS) 0.9 % injection 10 mL  10 mL Intravenous PRN Cammie Sickle, MD   10 mL at 11/24/17 1434  ? ? ?Allergies  ?Allergen Reactions  ? Lisinopril Hives and Rash  ? ? ?Family History  ?Problem Relation Age of Onset  ? Thyroid disease Mother   ? Arthritis Mother   ? Stroke Mother   ? Hypertension Mother   ? Diabetes Mother   ? Arthritis Father   ? Heart disease Father 6  ?     s/p CABG  ? Stroke Father   ? Hypertension Father   ? Alcohol abuse Maternal Grandmother   ? Diabetes Maternal Grandmother   ? Alcohol abuse Maternal Grandfather   ? Diabetes Maternal Grandfather   ? Breast cancer Paternal Grandmother   ? Cancer Paternal Grandmother   ?     breast  ? Heart disease Paternal Grandfather   ? Breast cancer Paternal Aunt 69  ? Colon cancer  Neg Hx   ? ? ?BP (!) 146/74 (BP Location: Left Arm, Patient Position: Sitting, Cuff Size: Normal)   Pulse 74   Ht '5\' 5"'$  (1.651 m)   Wt 226 lb 12.8 oz (  102.9 kg)   LMP 12/11/2013   SpO2 97%   BMI 37.74 kg/m?  ? ? ?Review of Systems ?Denies fever ?   ?Objective:  ? Physical Exam ?VS: see vs page ?GEN: no distress.   ?NECK: 5 cm right thyroid nodule is again noted ? ?Lab Results  ?Component Value Date  ? TSH <0.01 (L) 10/02/2021  ? ?   ?Assessment & Plan:  ?Hyperthyroidism: uncontrolled ?MNG: we discussed low risk of cancer in setting of hyperthyroidism.  However, I advised her to do the bx as advised.   ? ?

## 2021-10-02 NOTE — Patient Instructions (Addendum)
Blood tests are requested for you today.  We'll let you know about the results.   ?If ever you have fever while taking methimazole, stop it and call us, even if the reason is obvious, because of the risk of a rare side-effect. ?It is best to never miss the medication.  However, if you do miss it, next best is to double up the next time.   ?Please do the biopsy as scheduled.   ?Please come back for a follow-up appointment in 1 month.   ?

## 2021-10-06 ENCOUNTER — Telehealth: Payer: 59 | Admitting: Nurse Practitioner

## 2021-10-06 DIAGNOSIS — R051 Acute cough: Secondary | ICD-10-CM

## 2021-10-06 DIAGNOSIS — J301 Allergic rhinitis due to pollen: Secondary | ICD-10-CM | POA: Diagnosis not present

## 2021-10-06 MED ORDER — BENZONATATE 100 MG PO CAPS: 100.0000 mg | ORAL_CAPSULE | Freq: Three times a day (TID) | ORAL | 0 refills | Status: DC | PRN

## 2021-10-06 MED ORDER — FLUTICASONE PROPIONATE 50 MCG/ACT NA SUSP: 2.0000 | Freq: Every day | NASAL | 6 refills | Status: DC

## 2021-10-06 MED ORDER — ALBUTEROL SULFATE HFA 108 (90 BASE) MCG/ACT IN AERS: 2.0000 | INHALATION_SPRAY | Freq: Four times a day (QID) | RESPIRATORY_TRACT | 0 refills | Status: DC | PRN

## 2021-10-06 NOTE — Progress Notes (Signed)
We are sorry that you are not feeling well.  Here is how we plan to help! ? ?Based on your presentation I believe you most likely have A cough due to allergies.  I recommend that you start the an over-the counter-allergy medication such as Claritin 10 mg or Zyrtec 10 mg daily.  We will also call in a prescription for an allergy nasal spray.  ?  ?We cannot prescribe any narcotics or controlled substances within eVisits.  This includes cough medications that contain codeine. If you feel that you need these, a face to face with a provider is required.  We will refill your inhaler and you should be using this in combination with allergy medicine to help with the cough that is being caused by post nasal drainage.  ? ? ?USE OF BRONCHODILATOR ("RESCUE") INHALERS: ?There is a risk from using your bronchodilator too frequently.  The risk is that over-reliance on a medication which only relaxes the muscles surrounding the breathing tubes can reduce the effectiveness of medications prescribed to reduce swelling and congestion of the tubes themselves.  Although you feel brief relief from the bronchodilator inhaler, your asthma may actually be worsening with the tubes becoming more swollen and filled with mucus.  This can delay other crucial treatments, such as oral steroid medications. If you need to use a bronchodilator inhaler daily, several times per day, you should discuss this with your provider.  There are probably better treatments that could be used to keep your asthma under control.  ?   ?HOME CARE ?Only take medications as instructed by your medical team. ?Complete the entire course of an antibiotic. ?Drink plenty of fluids and get plenty of rest. ?Avoid close contacts especially the very young and the elderly ?Cover your mouth if you cough or cough into your sleeve. ?Always remember to wash your hands ?A steam or ultrasonic humidifier can help congestion.  ? ?GET HELP RIGHT AWAY IF: ?You develop worsening fever. ?You  become short of breath ?You cough up blood. ?Your symptoms persist after you have completed your treatment plan ?MAKE SURE YOU  ?Understand these instructions. ?Will watch your condition. ?Will get help right away if you are not doing well or get worse. ?  ? ?Thank you for choosing an e-visit. ? ?Your e-visit answers were reviewed by a board certified advanced clinical practitioner to complete your personal care plan. Depending upon the condition, your plan could have included both over the counter or prescription medications. ? ?Please review your pharmacy choice. Make sure the pharmacy is open so you can pick up prescription now. If there is a problem, you may contact your provider through [employer] and have the prescription routed to another pharmacy.  Your safety is important to [place]. If you have drug allergies check your prescription carefully.  ? ?For the next 24 hours you can use MyChart to ask questions about today's visit, request a non-urgent call back, or ask for a work or school excuse. ?You will get an email in the next two days asking about your experience. I hope that your e-visit has been valuable and will speed your recovery.  ? ?I spent approximately 7 minutes reviewing the patient's history, current symptoms and coordinating their plan of care today.   ? ?Meds ordered this encounter  ?Medications  ? albuterol (VENTOLIN HFA) 108 (90 Base) MCG/ACT inhaler  ?  Sig: Inhale 2 puffs into the lungs every 6 (six) hours as needed for wheezing or shortness of breath.  ?  Dispense:  8 g  ?  Refill:  0  ? fluticasone (FLONASE) 50 MCG/ACT nasal spray  ?  Sig: Place 2 sprays into both nostrils daily.  ?  Dispense:  16 g  ?  Refill:  6  ?  ?

## 2021-10-06 NOTE — Progress Notes (Signed)
Helene Kelp, ? ?You can take the pearls, I will call some in for you to use. Be sure to also start all the allergy and asthma medications as well. ? ?Thank you ?Apolonio Schneiders FNP-C ? ?Meds ordered this encounter  ?Medications  ? albuterol (VENTOLIN HFA) 108 (90 Base) MCG/ACT inhaler  ?  Sig: Inhale 2 puffs into the lungs every 6 (six) hours as needed for wheezing or shortness of breath.  ?  Dispense:  8 g  ?  Refill:  0  ? fluticasone (FLONASE) 50 MCG/ACT nasal spray  ?  Sig: Place 2 sprays into both nostrils daily.  ?  Dispense:  16 g  ?  Refill:  6  ? benzonatate (TESSALON) 100 MG capsule  ?  Sig: Take 1 capsule (100 mg total) by mouth 3 (three) times daily as needed.  ?  Dispense:  30 capsule  ?  Refill:  0  ?  ?

## 2021-10-06 NOTE — Addendum Note (Signed)
Addended by: Apolonio Schneiders E on: 10/06/2021 07:51 AM ? ? Modules accepted: Orders ? ?

## 2021-10-07 ENCOUNTER — Encounter: Payer: Self-pay | Admitting: Endocrinology

## 2021-10-07 LAB — ALKALINE PHOSPHATASE, ISOENZYMES
Alkaline Phosphatase: 167 IU/L — ABNORMAL HIGH (ref 44–121)
BONE FRACTION: 76 % — ABNORMAL HIGH (ref 14–68)
INTESTINAL FRAC.: 0 % (ref 0–18)
LIVER FRACTION: 24 % (ref 18–85)

## 2021-10-08 ENCOUNTER — Encounter: Payer: Self-pay | Admitting: Endocrinology

## 2021-10-08 ENCOUNTER — Encounter: Payer: Self-pay | Admitting: Family

## 2021-10-08 ENCOUNTER — Other Ambulatory Visit: Payer: Self-pay | Admitting: Endocrinology

## 2021-10-08 ENCOUNTER — Ambulatory Visit (INDEPENDENT_AMBULATORY_CARE_PROVIDER_SITE_OTHER): Payer: 59 | Admitting: Family

## 2021-10-08 ENCOUNTER — Telehealth: Payer: Self-pay | Admitting: Endocrinology

## 2021-10-08 ENCOUNTER — Other Ambulatory Visit: Payer: Self-pay

## 2021-10-08 ENCOUNTER — Other Ambulatory Visit: Payer: Self-pay | Admitting: Family

## 2021-10-08 VITALS — BP 128/68 | HR 78 | Temp 97.9°F | Ht 62.0 in | Wt 230.4 lb

## 2021-10-08 DIAGNOSIS — J40 Bronchitis, not specified as acute or chronic: Secondary | ICD-10-CM | POA: Diagnosis not present

## 2021-10-08 DIAGNOSIS — D72819 Decreased white blood cell count, unspecified: Secondary | ICD-10-CM

## 2021-10-08 DIAGNOSIS — R899 Unspecified abnormal finding in specimens from other organs, systems and tissues: Secondary | ICD-10-CM

## 2021-10-08 DIAGNOSIS — R748 Abnormal levels of other serum enzymes: Secondary | ICD-10-CM | POA: Diagnosis not present

## 2021-10-08 LAB — CBC WITH DIFFERENTIAL/PLATELET
Basophils Absolute: 0 10*3/uL (ref 0.0–0.1)
Basophils Relative: 0.8 % (ref 0.0–3.0)
Eosinophils Absolute: 0.3 10*3/uL (ref 0.0–0.7)
Eosinophils Relative: 9.2 % — ABNORMAL HIGH (ref 0.0–5.0)
HCT: 36.3 % (ref 36.0–46.0)
Hemoglobin: 12 g/dL (ref 12.0–15.0)
Lymphocytes Relative: 21.8 % (ref 12.0–46.0)
Lymphs Abs: 0.8 10*3/uL (ref 0.7–4.0)
MCHC: 33.1 g/dL (ref 30.0–36.0)
MCV: 80 fl (ref 78.0–100.0)
Monocytes Absolute: 0.4 10*3/uL (ref 0.1–1.0)
Monocytes Relative: 12.5 % — ABNORMAL HIGH (ref 3.0–12.0)
Neutro Abs: 1.9 10*3/uL (ref 1.4–7.7)
Neutrophils Relative %: 55.7 % (ref 43.0–77.0)
Platelets: 187 10*3/uL (ref 150.0–400.0)
RBC: 4.53 Mil/uL (ref 3.87–5.11)
RDW: 12.9 % (ref 11.5–15.5)
WBC: 3.5 10*3/uL — ABNORMAL LOW (ref 4.0–10.5)

## 2021-10-08 LAB — GAMMA GT: GGT: 27 U/L (ref 7–51)

## 2021-10-08 MED ORDER — HYDROCOD POLI-CHLORPHE POLI ER 10-8 MG/5ML PO SUER: 5.0000 mL | Freq: Two times a day (BID) | ORAL | 0 refills | Status: DC | PRN

## 2021-10-08 MED ORDER — PREDNISONE 10 MG PO TABS
ORAL_TABLET | ORAL | 0 refills | Status: DC
Start: 2021-10-08 — End: 2021-10-19

## 2021-10-08 MED ORDER — AZITHROMYCIN 250 MG PO TABS: ORAL_TABLET | ORAL | 0 refills | Status: AC

## 2021-10-08 NOTE — Patient Instructions (Addendum)
As discussed today, I will send Dr Burlene Arnt and note in regards to elevation alkaline phosphatase.  If you not hear back from me within a week, please let me know so I can reach back out to him. ? ? ?Please call  and schedule your 3D mammogram and /or bone density scan as we discussed. ? ? [facility]  ( new location in 2023) ? ?[address], [place], [place] [address] ? ?[place], [place]  ?[phone] ? ?Start azithromycin.  Start Tussionex.  Start prednisone however if you cannot get medication started prior to 2 PM today, I would not start until tomorrow as it can interfere with your sleep ? ? ?Please take cough medication only as needed. As we discussed, I do not recommend dosing throughout the day as coughing is a protective mechanism . It also helps to break up thick mucous.  ?Do not take cough suppressants with alcohol as can lead to trouble breathing. Advise caution if taking cough suppressant and operating machinery ( i.e driving a car) as you may feel very tired.  ? ?Ensure to take probiotics while on antibiotics and also for 2 weeks after completion. This can either be by eating yogurt daily or taking a probiotic supplement over the counter such as Culturelle.It is important to re-colonize the gut with good bacteria and also to prevent any diarrheal infections associated with antibiotic use.  ? ? ?Let me know how you are doing. ? ? ? ? ?

## 2021-10-08 NOTE — Assessment & Plan Note (Signed)
Afebrile.  No acute respiratory distress.  Few expiratory wheezes on exam.  COVID and flu negative.  Strep negative.  Advised to go ahead and start azithromycin based on duration of symptoms.  Due to expiratory wheezes and history of asthma, also provided her with a short prednisone taper.  She will take Tussionex for her cough as Tessalon Perles were ineffective for her.  She will let me know if symptoms do not improve.  She declines chest x-ray today. ?

## 2021-10-08 NOTE — Telephone Encounter (Signed)
please contact patient: ?Your white blood cells are slightly low.  Please recheck this blood test in 2-3 days.   ?

## 2021-10-08 NOTE — Progress Notes (Signed)
? ?Subjective:  ? ? Patient ID: Tammie Robinson, female    DOB: [birth year], 56 y.o.   MRN: [id number] ? ?CC: Tammie Robinson is a 56 y.o. female who presents today for an acute visit.   ? ?HPI: Acute visit ?Complains of congestion x 5 days ?Endorses PND.  ?She has been using albuterol , symbicort ?Coughing all day and all night ?No cp, sob, wheezing, chills, fever, ear pain, sore throat.  ? ?Covid test negative yesterday.  ?No recent antibiotic.  ? ?She has low back pain on rare occasion after walking long distances.  ? ?Following with Dr. Loanne Drilling for thyroid nodule, hyperthyroidism.  ? ?Evisit 10/06/21 and given tessalon and flonase which are not helpful.  ? ?HISTORY:  ?Past Medical History:  ?Diagnosis Date  ? Anemia   ? prior to CA dx  ? Asthma   ? seasonal,never hospitalized  ? Blood in stool   ? Colostomy in place [facility])   ? History of ERCP   ? Hypertension   ? Migraine   ? Neuropathy   ? 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 PTSD (post-traumatic stress disorder)   ? Rectal cancer ([place])   ? Systolic murmur   ? Wears contact lenses   ? ?Past Surgical History:  ?Procedure Laterality Date  ? ABDOMINAL HYSTERECTOMY  04/12/2014  ? [facility] with CA surgery; no cervix ( M.Dayshia Ballinas 01/2017)  ? CHOLECYSTECTOMY  2012  ? Dr. Jamal Collin  ? COLONOSCOPY WITH PROPOFOL N/A 01/26/2016  ? Procedure: COLONOSCOPY WITH PROPOFOL through colostomy;  Surgeon: Lucilla Lame, MD;  Location: [place];  Service: Endoscopy;  Laterality: N/A;  port-a-cath  ? COLONOSCOPY WITH PROPOFOL N/A 11/29/2019  ? Procedure: COLONOSCOPY WITH PROPOFOL;  Surgeon: Lucilla Lame, MD;  Location: [place];  Service: Endoscopy;  Laterality: N/A;  ? POLYPECTOMY  01/26/2016  ? Procedure: POLYPECTOMY;  Surgeon: Lucilla Lame, MD;  Location: [place];  Service: Endoscopy;;  ? POLYPECTOMY N/A 11/29/2019  ? Procedure: POLYPECTOMY;  Surgeon: Lucilla Lame, MD;  Location: [place];  Service: Endoscopy;  Laterality: N/A;  ? PORTA CATH REMOVAL N/A 01/28/2018   ? Procedure: PORTA CATH REMOVAL;  Surgeon: Algernon Huxley, MD;  Location: [place] CV LAB;  Service: Cardiovascular;  Laterality: N/A;  ? PORTACATH PLACEMENT    ? PROCTECTOMY  04/12/14  ? [facility] - CA surgery - with colostomy  ? SALPINGOOPHORECTOMY Bilateral 04/12/14  ? [facility] - with CA surg  ? VAGINA RECONSTRUCTION SURGERY  04/12/14  ? [facility] - transabdonimal rectus abdominus muscle reconstruction  ? VAGINAL DELIVERY    ? 2, preterm labor  ? ?Family History  ?Problem Relation Age of Onset  ? Thyroid disease Mother   ? Arthritis Mother   ? Stroke Mother   ? Hypertension Mother   ? Diabetes Mother   ? Arthritis Father   ? Heart disease Father 58  ?     s/p CABG  ? Stroke Father   ? Hypertension Father   ? Alcohol abuse Maternal Grandmother   ? Diabetes Maternal Grandmother   ? Alcohol abuse Maternal Grandfather   ? Diabetes Maternal Grandfather   ? Breast cancer Paternal Grandmother   ? Cancer Paternal Grandmother   ?     breast  ? Heart disease Paternal Grandfather   ? Breast cancer Paternal Aunt 45  ? Colon cancer Neg Hx   ? ? ?Allergies: Lisinopril ?Current Outpatient Medications on File Prior to Visit  ?Medication Sig Dispense Refill  ?  albuterol (PROVENTIL HFA;VENTOLIN HFA) 108 (90 Base) MCG/ACT inhaler Inhale 1-2 puffs into the lungs every 4 (four) hours as needed for wheezing or shortness of breath. 1 Inhaler 3  ? albuterol (VENTOLIN HFA) 108 (90 Base) MCG/ACT inhaler Inhale 2 puffs into the lungs every 6 (six) hours as needed for wheezing or shortness of breath. 8 g 0  ? amLODipine (NORVASC) 10 MG tablet TAKE ONE TABLET BY MOUTH ONCE DAILY 90 tablet 0  ? azelastine (ASTELIN) 0.1 % nasal spray Place 1 spray into both nostrils 2 (two) times daily. Use in each nostril as directed 30 mL 12  ? benzonatate (TESSALON) 100 MG capsule Take 1 capsule (100 mg total) by mouth 3 (three) times daily as needed. 30 capsule 0  ? budesonide-formoterol (SYMBICORT) 80-4.5 MCG/ACT inhaler Inhale 2 puffs into the lungs 2 (two) times  daily.    ? escitalopram (LEXAPRO) 10 MG tablet TAKE ONE TABLET BY MOUTH ONCE DAILY 90 tablet 1  ? fluticasone (FLONASE) 50 MCG/ACT nasal spray Place 2 sprays into both nostrils daily. 16 g 6  ? gabapentin (NEURONTIN) 300 MG capsule TAKE TWO CAPSULES BY MOUTH EVERY MORNING, ONE CAPSULE midday AND TWO CAPSULES EVERY EVENING 150 capsule 5  ? losartan (COZAAR) 25 MG tablet Take 1.5 tablets (37.5 mg total) by mouth daily. Take 1.5 tablet daily. 180 tablet 2  ? methimazole (TAPAZOLE) 10 MG tablet Take 4 tablets (40 mg total) by mouth 2 (two) times daily. 240 tablet 3  ? triamcinolone cream (KENALOG) 0.1 % Apply 1 application topically 4 (four) times daily as needed. 80 g 1  ? zolpidem (AMBIEN) 10 MG tablet TAKE ONE TABLET BY MOUTH AT BEDTIME AS NEEDED FOR SLEEP 30 tablet 2  ? ?Current Facility-Administered Medications on File Prior to Visit  ?Medication Dose Route Frequency Provider Last Rate Last Admin  ? heparin lock flush 100 unit/mL  500 Units Intravenous Once Leia Alf, MD      ? sodium chloride 0.9 % injection 10 mL  10 mL Intravenous PRN Leia Alf, MD      ? sodium chloride 0.9 % injection 10 mL  10 mL Intravenous PRN Leia Alf, MD   10 mL at 11/28/14 1120  ? sodium chloride flush (NS) 0.9 % injection 10 mL  10 mL Intravenous PRN Evlyn Kanner, NP   10 mL at 01/03/16 1413  ? sodium chloride flush (NS) 0.9 % injection 10 mL  10 mL Intravenous PRN Lloyd Huger, MD   10 mL at 11/15/16 1111  ? sodium chloride flush (NS) 0.9 % injection 10 mL  10 mL Intravenous PRN Cammie Sickle, MD   10 mL at 11/24/17 1434  ? ? ?Social History  ? ?Tobacco Use  ? Smoking status: Never  ? Smokeless tobacco: Never  ?Vaping Use  ? Vaping Use: Never used  ?Substance Use Topics  ? Alcohol use: No  ?  Comment: rare - holidays  ? Drug use: No  ? ? ?Review of Systems  ?Constitutional:  Negative for chills and fever.  ?HENT:  Positive for congestion and postnasal drip. Negative for ear pain, sinus pain, sore  throat and trouble swallowing.   ?Respiratory:  Positive for cough and wheezing. Negative for shortness of breath.   ?Cardiovascular:  Negative for chest pain and palpitations.  ?Gastrointestinal:  Negative for nausea and vomiting.  ?Neurological:  Negative for headaches.  ?   ?Objective:  ?  ?BP 128/68 (BP Location: Left Arm, Patient Position: Sitting, Cuff  Size: Normal)   Pulse 78   Temp 97.9 ?F (36.6 ?C) (Oral)   Ht '5\' 2"'$  (1.575 m)   Wt 230 lb 6.4 oz (104.5 kg)   LMP 12/11/2013   SpO2 98%   BMI 42.14 kg/m?  ? ? ?Physical Exam ?Vitals reviewed.  ?Constitutional:   ?   Appearance: She is well-developed.  ?HENT:  ?   Head: Normocephalic and atraumatic.  ?   Right Ear: Hearing, tympanic membrane, ear canal and external ear normal. No decreased hearing noted. No drainage, swelling or tenderness. No middle ear effusion. No foreign body. Tympanic membrane is not erythematous or bulging.  ?   Left Ear: Hearing, tympanic membrane, ear canal and external ear normal. No decreased hearing noted. No drainage, swelling or tenderness.  No middle ear effusion. No foreign body. Tympanic membrane is not erythematous or bulging.  ?   Nose: Nose normal. No rhinorrhea.  ?   Right Sinus: No maxillary sinus tenderness or frontal sinus tenderness.  ?   Left Sinus: No maxillary sinus tenderness or frontal sinus tenderness.  ?   Mouth/Throat:  ?   Pharynx: Uvula midline. No oropharyngeal exudate or posterior oropharyngeal erythema.  ?   Tonsils: No tonsillar abscesses.  ?Eyes:  ?   Conjunctiva/sclera: Conjunctivae normal.  ?Cardiovascular:  ?   Rate and Rhythm: Regular rhythm.  ?   Pulses: Normal pulses.  ?   Heart sounds: Normal heart sounds.  ?Pulmonary:  ?   Effort: Pulmonary effort is normal.  ?   Breath sounds: Normal breath sounds. No wheezing, rhonchi or rales.  ?   Comments: Few expiratory wheezes ?Lymphadenopathy:  ?   Head:  ?   Right side of head: No submental, submandibular, tonsillar, preauricular, posterior auricular  or occipital adenopathy.  ?   Left side of head: No submental, submandibular, tonsillar, preauricular, posterior auricular or occipital adenopathy.  ?   Cervical: No cervical adenopathy.  ?Skin: ?   General: Sk

## 2021-10-08 NOTE — Assessment & Plan Note (Addendum)
Remains elevated 128-167. Normal calcium. GGT normal. Alk phos more fractionated out to be bone etiology.I  have sent a message to Dr Rogue Bussing whom she previously saw for h/o rectal cancer regarding either NM Pet initial skull base to thigh or NM Pet initial whole body. ?Pending bone density. I have also sent message to Dr Loanne Drilling, endocrine, regarding further evaluation of elevation alkaline phosphatase, particularly as it relates to Paget's disease.  Deferred xray imaged of low back and hip to evaluated for Paget's disease without gross symptoms at this time unless Dr Loanne Drilling advises otherwise.  ?

## 2021-10-09 ENCOUNTER — Other Ambulatory Visit: Payer: Self-pay | Admitting: Surgery

## 2021-10-09 ENCOUNTER — Encounter: Payer: Self-pay | Admitting: Family

## 2021-10-09 DIAGNOSIS — E041 Nontoxic single thyroid nodule: Secondary | ICD-10-CM

## 2021-10-09 NOTE — Telephone Encounter (Signed)
MyChart message now sent. ?

## 2021-10-10 ENCOUNTER — Telehealth: Payer: Self-pay | Admitting: Family

## 2021-10-10 DIAGNOSIS — C20 Malignant neoplasm of rectum: Secondary | ICD-10-CM

## 2021-10-10 DIAGNOSIS — R748 Abnormal levels of other serum enzymes: Secondary | ICD-10-CM

## 2021-10-10 NOTE — Telephone Encounter (Signed)
-----  Message from Renato Shin, MD sent at 10/09/2021  4:48 PM EDT ----- ?I would prob order nuc med bone scan if I was you.  I hope this helps. ? ?----- Message ----- ?From: Burnard Hawthorne, FNP ?Sent: 10/08/2021   1:09 PM EDT ?To: Renato Shin, MD ? ?Dr Loanne Drilling,  ? ?Hope you are well.  ? ?I wanted to circle back with pt's elevated alk phos.  ?She has normal GGT in the past so very much looks like bone etiology  ? ?She has h/o rectal cancer so I am consulting with her previous oncologist regarding bone scan ? ?However I wanted ask if I needed to order any plain film to evaluate for paget disease. She doenst complain of hip or back pain.  ?I have also ordered DEXA for her to schedule as well ? ?Just wanted to primarily ensure that I didn't need to evaluate further for paget disease. If you do recommend xrays per above- would lumbar and pelvis give the most information/reassurance?   ? ?Appreciate your advice ? ?Tammie Robinson ?Cell [phone] ? ? ? ?

## 2021-10-10 NOTE — Telephone Encounter (Signed)
Call pt ? ?Consulted with dr Loanne Drilling ?Please ensure she has seen result note regarding lab here cbc ( I have ordered) in a couple of weeks AND bone scan  ? ? ? ?

## 2021-10-10 NOTE — Telephone Encounter (Signed)
Spoke to patient about consult and recommendation for bone scan.Pt stated that she would contact the office if she decided to get one or not. Scheduled appointment for lab for 10/11/21 ?

## 2021-10-10 NOTE — Telephone Encounter (Signed)
I called pt to sch , Pt stated she will speak with husband first and then call back to sch. ?

## 2021-10-11 ENCOUNTER — Ambulatory Visit
Admission: RE | Admit: 2021-10-11 | Discharge: 2021-10-11 | Disposition: A | Discharge status: Home / Self Care | Payer: 59 | Source: Ambulatory Visit | Attending: Surgery | Admitting: Surgery

## 2021-10-11 ENCOUNTER — Other Ambulatory Visit (HOSPITAL_COMMUNITY)
Admission: RE | Admit: 2021-10-11 | Discharge: 2021-10-11 | Disposition: A | Discharge status: Home / Self Care | Payer: 59 | Source: Ambulatory Visit | Attending: Surgery | Admitting: Surgery

## 2021-10-11 ENCOUNTER — Other Ambulatory Visit (INDEPENDENT_AMBULATORY_CARE_PROVIDER_SITE_OTHER): Payer: 59

## 2021-10-11 ENCOUNTER — Other Ambulatory Visit: Payer: Self-pay

## 2021-10-11 DIAGNOSIS — E0789 Other specified disorders of thyroid: Secondary | ICD-10-CM | POA: Diagnosis not present

## 2021-10-11 DIAGNOSIS — E041 Nontoxic single thyroid nodule: Secondary | ICD-10-CM | POA: Diagnosis not present

## 2021-10-11 DIAGNOSIS — R899 Unspecified abnormal finding in specimens from other organs, systems and tissues: Secondary | ICD-10-CM | POA: Diagnosis not present

## 2021-10-11 LAB — CBC WITH DIFFERENTIAL/PLATELET
Basophils Absolute: 0 10*3/uL (ref 0.0–0.1)
Basophils Relative: 0.4 % (ref 0.0–3.0)
Eosinophils Absolute: 0 10*3/uL (ref 0.0–0.7)
Eosinophils Relative: 0.6 % (ref 0.0–5.0)
HCT: 37.3 % (ref 36.0–46.0)
Hemoglobin: 12.3 g/dL (ref 12.0–15.0)
Lymphocytes Relative: 24.9 % (ref 12.0–46.0)
Lymphs Abs: 1.4 10*3/uL (ref 0.7–4.0)
MCHC: 33 g/dL (ref 30.0–36.0)
MCV: 79.5 fl (ref 78.0–100.0)
Monocytes Absolute: 0.2 10*3/uL (ref 0.1–1.0)
Monocytes Relative: 4.2 % (ref 3.0–12.0)
Neutro Abs: 3.9 10*3/uL (ref 1.4–7.7)
Neutrophils Relative %: 69.9 % (ref 43.0–77.0)
Platelets: 211 10*3/uL (ref 150.0–400.0)
RBC: 4.69 Mil/uL (ref 3.87–5.11)
RDW: 12.9 % (ref 11.5–15.5)
WBC: 5.6 10*3/uL (ref 4.0–10.5)

## 2021-10-12 LAB — CYTOLOGY - NON PAP

## 2021-10-12 NOTE — Telephone Encounter (Signed)
Noted ?Regarding NM bone scan ?

## 2021-10-14 ENCOUNTER — Encounter: Payer: Self-pay | Admitting: Family

## 2021-10-17 ENCOUNTER — Telehealth: Payer: Self-pay | Admitting: Family

## 2021-10-17 DIAGNOSIS — E041 Nontoxic single thyroid nodule: Secondary | ICD-10-CM | POA: Diagnosis not present

## 2021-10-17 NOTE — Telephone Encounter (Signed)
Lft pt vm to call ofc to sch NM bone scan. Thank you ?

## 2021-10-18 ENCOUNTER — Encounter: Payer: Self-pay | Admitting: Family

## 2021-10-18 NOTE — Telephone Encounter (Signed)
LMTCB to schedule follow-up appointment.  

## 2021-10-19 ENCOUNTER — Ambulatory Visit (INDEPENDENT_AMBULATORY_CARE_PROVIDER_SITE_OTHER): Payer: 59

## 2021-10-19 ENCOUNTER — Encounter: Payer: Self-pay | Admitting: Family

## 2021-10-19 ENCOUNTER — Ambulatory Visit (INDEPENDENT_AMBULATORY_CARE_PROVIDER_SITE_OTHER): Payer: 59 | Admitting: Family

## 2021-10-19 VITALS — BP 130/72 | HR 77 | Temp 98.3°F | Ht 62.0 in | Wt 230.0 lb

## 2021-10-19 DIAGNOSIS — R748 Abnormal levels of other serum enzymes: Secondary | ICD-10-CM | POA: Diagnosis not present

## 2021-10-19 DIAGNOSIS — R059 Cough, unspecified: Secondary | ICD-10-CM | POA: Diagnosis not present

## 2021-10-19 DIAGNOSIS — J40 Bronchitis, not specified as acute or chronic: Secondary | ICD-10-CM

## 2021-10-19 MED ORDER — PREDNISONE 10 MG PO TABS: ORAL_TABLET | ORAL | 0 refills | Status: DC

## 2021-10-19 MED ORDER — BUDESONIDE-FORMOTEROL FUMARATE 160-4.5 MCG/ACT IN AERO: 2.0000 | INHALATION_SPRAY | Freq: Two times a day (BID) | RESPIRATORY_TRACT | 3 refills | Status: DC

## 2021-10-19 MED ORDER — HYDROCOD POLI-CHLORPHE POLI ER 10-8 MG/5ML PO SUER: 10.0000 mL | Freq: Every day | ORAL | 0 refills | Status: DC | PRN

## 2021-10-19 NOTE — Progress Notes (Signed)
? ?Subjective:  ? ? Patient ID: Tammie Robinson, female    DOB: 06/27/1966, 56 y.o.   MRN: [id number] ? ?CC: Tammie Robinson is a 56 y.o. female who presents today for follow up.  ? ?HPI: Follow-up 11 days after bronchitis diagnosis. Cough and congestion for 16 days which has overall improved.  ?Cough is most bothersome for her. Occasionally sputum with cough. Endorses PND.  Cough is 'sporadic' ; could occur rest or when walking. She sleeps on the recliner so she doesn't cough. Continues have nasal congestion and occasional wheezing.  No facial pain, sob, fever, leg swelling ? ? She completed azithromycin, prednisone 4 day taper.  She has been using Tussionex, Tessalon Perles  ? ? ?She is compliant with Symbicort inhaler.She has used albuterol 'a couple of times.'  ? ?pending NM Bone scan-she intends to schedule after she sees endocrinology late April. ?HISTORY:  ?Past Medical History:  ?Diagnosis Date  ? Anemia   ? prior to CA dx  ? Asthma   ? seasonal,never hospitalized  ? Blood in stool   ? Colostomy in place [facility])   ? History of ERCP   ? Hypertension   ? Migraine   ? Neuropathy   ? 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 PTSD (post-traumatic stress disorder)   ? Rectal cancer ([place])   ? Systolic murmur   ? Wears contact lenses   ? ?Past Surgical History:  ?Procedure Laterality Date  ? ABDOMINAL HYSTERECTOMY  04/12/2014  ? [facility] with CA surgery; no cervix ( M.Bilaal Leib 01/2017)  ? CHOLECYSTECTOMY  2012  ? Dr. Jamal Collin  ? COLONOSCOPY WITH PROPOFOL N/A 01/26/2016  ? Procedure: COLONOSCOPY WITH PROPOFOL through colostomy;  Surgeon: Lucilla Lame, MD;  Location: [place];  Service: Endoscopy;  Laterality: N/A;  port-a-cath  ? COLONOSCOPY WITH PROPOFOL N/A 11/29/2019  ? Procedure: COLONOSCOPY WITH PROPOFOL;  Surgeon: Lucilla Lame, MD;  Location: [place];  Service: Endoscopy;  Laterality: N/A;  ? POLYPECTOMY  01/26/2016  ? Procedure: POLYPECTOMY;  Surgeon: Lucilla Lame, MD;  Location: [place];  Service:  Endoscopy;;  ? POLYPECTOMY N/A 11/29/2019  ? Procedure: POLYPECTOMY;  Surgeon: Lucilla Lame, MD;  Location: [place];  Service: Endoscopy;  Laterality: N/A;  ? PORTA CATH REMOVAL N/A 01/28/2018  ? Procedure: PORTA CATH REMOVAL;  Surgeon: Algernon Huxley, MD;  Location: [place] CV LAB;  Service: Cardiovascular;  Laterality: N/A;  ? PORTACATH PLACEMENT    ? PROCTECTOMY  04/12/14  ? [facility] - CA surgery - with colostomy  ? SALPINGOOPHORECTOMY Bilateral 04/12/14  ? [facility] - with CA surg  ? VAGINA RECONSTRUCTION SURGERY  04/12/14  ? [facility] - transabdonimal rectus abdominus muscle reconstruction  ? VAGINAL DELIVERY    ? 2, preterm labor  ? ?Family History  ?Problem Relation Age of Onset  ? Thyroid disease Mother   ? Arthritis Mother   ? Stroke Mother   ? Hypertension Mother   ? Diabetes Mother   ? Arthritis Father   ? Heart disease Father 18  ?     s/p CABG  ? Stroke Father   ? Hypertension Father   ? Alcohol abuse Maternal Grandmother   ? Diabetes Maternal Grandmother   ? Alcohol abuse Maternal Grandfather   ? Diabetes Maternal Grandfather   ? Breast cancer Paternal Grandmother   ? Cancer Paternal Grandmother   ?     breast  ? Heart disease Paternal Grandfather   ? Breast cancer Paternal  Aunt 70  ? Colon cancer Neg Hx   ? ? ?Allergies: Lisinopril ?Current Outpatient Medications on File Prior to Visit  ?Medication Sig Dispense Refill  ? albuterol (PROVENTIL HFA;VENTOLIN HFA) 108 (90 Base) MCG/ACT inhaler Inhale 1-2 puffs into the lungs every 4 (four) hours as needed for wheezing or shortness of breath. 1 Inhaler 3  ? albuterol (VENTOLIN HFA) 108 (90 Base) MCG/ACT inhaler Inhale 2 puffs into the lungs every 6 (six) hours as needed for wheezing or shortness of breath. 8 g 0  ? amLODipine (NORVASC) 10 MG tablet TAKE ONE TABLET BY MOUTH ONCE DAILY 90 tablet 0  ? azelastine (ASTELIN) 0.1 % nasal spray Place 1 spray into both nostrils 2 (two) times daily. Use in each nostril as directed 30 mL 12  ? benzonatate (TESSALON) 100  MG capsule Take 1 capsule (100 mg total) by mouth 3 (three) times daily as needed. 30 capsule 0  ? escitalopram (LEXAPRO) 10 MG tablet TAKE ONE TABLET BY MOUTH ONCE DAILY 90 tablet 1  ? fluticasone (FLONASE) 50 MCG/ACT nasal spray Place 2 sprays into both nostrils daily. 16 g 6  ? gabapentin (NEURONTIN) 300 MG capsule TAKE TWO CAPSULES BY MOUTH EVERY MORNING, ONE CAPSULE midday AND TWO CAPSULES EVERY EVENING 150 capsule 5  ? losartan (COZAAR) 25 MG tablet Take 1.5 tablets (37.5 mg total) by mouth daily. Take 1.5 tablet daily. 180 tablet 2  ? methimazole (TAPAZOLE) 10 MG tablet Take 4 tablets (40 mg total) by mouth 2 (two) times daily. 240 tablet 3  ? triamcinolone cream (KENALOG) 0.1 % Apply 1 application topically 4 (four) times daily as needed. 80 g 1  ? zolpidem (AMBIEN) 10 MG tablet TAKE ONE TABLET BY MOUTH AT BEDTIME AS NEEDED FOR SLEEP 30 tablet 2  ? ?Current Facility-Administered Medications on File Prior to Visit  ?Medication Dose Route Frequency Provider Last Rate Last Admin  ? heparin lock flush 100 unit/mL  500 Units Intravenous Once Leia Alf, MD      ? sodium chloride 0.9 % injection 10 mL  10 mL Intravenous PRN Leia Alf, MD      ? sodium chloride 0.9 % injection 10 mL  10 mL Intravenous PRN Leia Alf, MD   10 mL at 11/28/14 1120  ? sodium chloride flush (NS) 0.9 % injection 10 mL  10 mL Intravenous PRN Evlyn Kanner, NP   10 mL at 01/03/16 1413  ? sodium chloride flush (NS) 0.9 % injection 10 mL  10 mL Intravenous PRN Lloyd Huger, MD   10 mL at 11/15/16 1111  ? sodium chloride flush (NS) 0.9 % injection 10 mL  10 mL Intravenous PRN Cammie Sickle, MD   10 mL at 11/24/17 1434  ? ? ?Social History  ? ?Tobacco Use  ? Smoking status: Never  ? Smokeless tobacco: Never  ?Vaping Use  ? Vaping Use: Never used  ?Substance Use Topics  ? Alcohol use: No  ?  Comment: rare - holidays  ? Drug use: No  ? ? ?Review of Systems  ?Constitutional:  Negative for chills and fever.   ?HENT:  Positive for congestion and postnasal drip.   ?Respiratory:  Positive for cough and wheezing. Negative for shortness of breath.   ?Cardiovascular:  Negative for chest pain and palpitations.  ?Gastrointestinal:  Negative for nausea and vomiting.  ?   ?Objective:  ?  ?BP 130/72 (BP Location: Left Arm, Patient Position: Sitting, Cuff Size: Large)   Pulse 77  Temp 98.3 ?F (36.8 ?C) (Oral)   Ht '5\' 2"'$  (1.575 m)   Wt 230 lb (104.3 kg)   LMP 12/11/2013   SpO2 98%   BMI 42.07 kg/m?  ?BP Readings from Last 3 Encounters:  ?10/19/21 130/72  ?10/08/21 128/68  ?10/02/21 (!) 146/74  ? ?Wt Readings from Last 3 Encounters:  ?10/19/21 230 lb (104.3 kg)  ?10/08/21 230 lb 6.4 oz (104.5 kg)  ?10/02/21 226 lb 12.8 oz (102.9 kg)  ? ? ?Physical Exam ?Vitals reviewed.  ?Constitutional:   ?   Appearance: She is well-developed.  ?Eyes:  ?   Conjunctiva/sclera: Conjunctivae normal.  ?Cardiovascular:  ?   Rate and Rhythm: Normal rate and regular rhythm.  ?   Pulses: Normal pulses.  ?   Heart sounds: Normal heart sounds.  ?Pulmonary:  ?   Effort: Pulmonary effort is normal.  ?   Breath sounds: Normal breath sounds. No wheezing, rhonchi or rales.  ?Skin: ?   General: Skin is warm and dry.  ?Neurological:  ?   Mental Status: She is alert.  ?Psychiatric:     ?   Speech: Speech normal.     ?   Behavior: Behavior normal.     ?   Thought Content: Thought content normal.  ? ? ?   ?Assessment & Plan:  ? ?Problem List Items Addressed This Visit   ? ?  ? Respiratory  ? Bronchitis - Primary  ?  Presentation consistent with bronchitis.  She has completed azithromycin and advised her today that I felt less likely bacterial in presentation.  Pending chest x-ray to ensure no pneumonia . Start second course of prednisone. I have increased symbicort to '160mg'$ . Refilled tussionex and since 42m dose hadnt been helpful, increased to 143mQD prn. She will me know how she is doing.  ?  ?  ? Relevant Medications  ? predniSONE (DELTASONE) 10 MG tablet   ? chlorpheniramine-HYDROcodone (TUSSIONEX PENNKINETIC ER) 10-8 MG/5ML  ? budesonide-formoterol (SYMBICORT) 160-4.5 MCG/ACT inhaler  ? Other Relevant Orders  ? DG Chest 2 View  ?  ? Other  ? Elevated alkaline phosphatas

## 2021-10-19 NOTE — Patient Instructions (Addendum)
Do not take tussionex with ambien as too sedating ? ?I have increased Tussionex to 10 mL.  ? ?Please take cough medication at night only as needed. As we discussed, I do not recommend dosing throughout the day as coughing is a protective mechanism . It also helps to break up thick mucous.  ?Do not take cough suppressants with alcohol as can lead to trouble breathing. Advise caution if taking cough suppressant and operating machinery ( i.e driving a car) as you may feel very tired.  ? ?I have restarted a prednisone taper.  I have also increased Symbicort dosing ? ?Please let me know how you are doing and if the cough does not completely resolve ?

## 2021-10-19 NOTE — Assessment & Plan Note (Signed)
Presentation consistent with bronchitis.  She has completed azithromycin and advised her today that I felt less likely bacterial in presentation.  Pending chest x-ray to ensure no pneumonia . Start second course of prednisone. I have increased symbicort to '160mg'$ . Refilled tussionex and since 26m dose hadnt been helpful, increased to 142mQD prn. She will me know how she is doing.  ?

## 2021-10-19 NOTE — Assessment & Plan Note (Signed)
NM Bone scan has been ordered due to h/o rectal cancer. Patient will call referral coordinator to schedule this.  ?

## 2021-10-29 NOTE — Progress Notes (Signed)
FNA shows atypia and has been sent for [facility] testing.  This will take 10-14 days to get results.  Will notify patient when results available. ? ?tmg ? ?Armandina Gemma, MD ?[facility] ?A [facility] practice ?Office: [phone] ?

## 2021-10-30 ENCOUNTER — Encounter (HOSPITAL_COMMUNITY): Payer: Self-pay

## 2021-10-30 NOTE — Progress Notes (Signed)
Good news!  AFIRMA test is benign, meaning risk of cancer is less than 4%.  We can observe these nodules with ultrasound. ? ?We do need to get a thyroid radioactive iodine uptake scan to see if this is a "hot" nodule.  We discussed this previously. ? ?Claiborne Billings - please order uptake scan. ? ?tmg ? ?Armandina Gemma, MD ?[facility] ?A [facility] practice ?Office: [phone] ?

## 2021-10-31 NOTE — Telephone Encounter (Signed)
Patient called referrals, patient states go ahead and schedule bone scan. ?

## 2021-11-06 ENCOUNTER — Telehealth: Payer: Self-pay

## 2021-11-06 DIAGNOSIS — G4700 Insomnia, unspecified: Secondary | ICD-10-CM

## 2021-11-06 DIAGNOSIS — C20 Malignant neoplasm of rectum: Secondary | ICD-10-CM

## 2021-11-06 MED ORDER — GABAPENTIN 300 MG PO CAPS: ORAL_CAPSULE | ORAL | 5 refills | Status: DC

## 2021-11-07 MED ORDER — ZOLPIDEM TARTRATE 10 MG PO TABS: 10.0000 mg | ORAL_TABLET | Freq: Every evening | ORAL | 2 refills | Status: DC | PRN

## 2021-11-08 ENCOUNTER — Ambulatory Visit
Admission: RE | Admit: 2021-11-08 | Discharge: 2021-11-08 | Disposition: A | Discharge status: Home / Self Care | Payer: 59 | Source: Ambulatory Visit | Attending: Family | Admitting: Family

## 2021-11-08 ENCOUNTER — Ambulatory Visit (HOSPITAL_COMMUNITY): Payer: 59

## 2021-11-08 ENCOUNTER — Other Ambulatory Visit: Payer: 59

## 2021-11-08 DIAGNOSIS — Z85048 Personal history of other malignant neoplasm of rectum, rectosigmoid junction, and anus: Secondary | ICD-10-CM | POA: Diagnosis not present

## 2021-11-08 DIAGNOSIS — R748 Abnormal levels of other serum enzymes: Secondary | ICD-10-CM | POA: Diagnosis not present

## 2021-11-08 DIAGNOSIS — C20 Malignant neoplasm of rectum: Secondary | ICD-10-CM | POA: Insufficient documentation

## 2021-11-08 MED ORDER — TECHNETIUM TC 99M MEDRONATE IV KIT
20.0000 | PACK | Freq: Once | INTRAVENOUS | Status: AC | PRN
  Administered 2021-11-08: 22.44 via INTRAVENOUS

## 2021-11-08 NOTE — Telephone Encounter (Signed)
Pt returning call. Pt requesting callback.  °

## 2021-11-08 NOTE — Telephone Encounter (Signed)
LMTCB

## 2021-11-09 ENCOUNTER — Ambulatory Visit: Payer: 59 | Admitting: Endocrinology

## 2021-11-09 ENCOUNTER — Encounter: Payer: Self-pay | Admitting: Endocrinology

## 2021-11-09 VITALS — BP 144/82 | HR 91 | Ht 62.0 in | Wt 233.2 lb

## 2021-11-09 DIAGNOSIS — R7989 Other specified abnormal findings of blood chemistry: Secondary | ICD-10-CM | POA: Diagnosis not present

## 2021-11-09 LAB — TSH: TSH: <0.01 u[IU]/mL — ABNORMAL LOW (ref 0.35–5.50)

## 2021-11-09 LAB — T4, FREE: Free T4: 0.66 ng/dL (ref 0.60–1.60)

## 2021-11-09 MED ORDER — METHIMAZOLE 10 MG PO TABS: 20.0000 mg | ORAL_TABLET | Freq: Two times a day (BID) | ORAL | 1 refills | Status: DC

## 2021-11-09 NOTE — Progress Notes (Signed)
? ?Subjective:  ? ? Patient ID: Tammie Robinson, female    DOB: 07-Feb-1966, 56 y.o.   MRN: [id number] ? ?HPI ?Pt returns for f/u of hyperthyroidism (dx'ed 2022: US showed MNG--bx advised; she was rx'ed tapazole).  pt states she feels better in general.  However, fatigue persists.  bx was low risk on Afirma  ?Tremor is much better.   ?Past Medical History:  ?Diagnosis Date  ? Anemia   ? prior to CA dx  ? Asthma   ? seasonal,never hospitalized  ? Blood in stool   ? Colostomy in place [facility])   ? History of ERCP   ? Hypertension   ? Migraine   ? Neuropathy   ? 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 PTSD (post-traumatic stress disorder)   ? Rectal cancer ([place])   ? Systolic murmur   ? Wears contact lenses   ? ? ?Past Surgical History:  ?Procedure Laterality Date  ? ABDOMINAL HYSTERECTOMY  04/12/2014  ? [facility] with CA surgery; no cervix ( M.Arnett 01/2017)  ? CHOLECYSTECTOMY  2012  ? Dr. Jamal Collin  ? COLONOSCOPY WITH PROPOFOL N/A 01/26/2016  ? Procedure: COLONOSCOPY WITH PROPOFOL through colostomy;  Surgeon: Lucilla Lame, MD;  Location: [place];  Service: Endoscopy;  Laterality: N/A;  port-a-cath  ? COLONOSCOPY WITH PROPOFOL N/A 11/29/2019  ? Procedure: COLONOSCOPY WITH PROPOFOL;  Surgeon: Lucilla Lame, MD;  Location: [place];  Service: Endoscopy;  Laterality: N/A;  ? POLYPECTOMY  01/26/2016  ? Procedure: POLYPECTOMY;  Surgeon: Lucilla Lame, MD;  Location: [place];  Service: Endoscopy;;  ? POLYPECTOMY N/A 11/29/2019  ? Procedure: POLYPECTOMY;  Surgeon: Lucilla Lame, MD;  Location: [place];  Service: Endoscopy;  Laterality: N/A;  ? PORTA CATH REMOVAL N/A 01/28/2018  ? Procedure: PORTA CATH REMOVAL;  Surgeon: Algernon Huxley, MD;  Location: [place] [facility];  Service: Cardiovascular;  Laterality: N/A;  ? PORTACATH PLACEMENT    ? PROCTECTOMY  04/12/14  ? [facility] - CA surgery - with colostomy  ? SALPINGOOPHORECTOMY Bilateral 04/12/14  ? [facility] - with CA surg  ? VAGINA RECONSTRUCTION SURGERY  04/12/14  ? [facility] -  transabdonimal rectus abdominus muscle reconstruction  ? VAGINAL DELIVERY    ? 2, preterm labor  ? ? ?Social History  ? ?Socioeconomic History  ? Marital status: Married  ?  Spouse name: Not on file  ? Number of children: Not on file  ? Years of education: Not on file  ? Highest education level: Not on file  ?Occupational History  ? Not on file  ?Tobacco Use  ? Smoking status: Never  ? Smokeless tobacco: Never  ?Vaping Use  ? Vaping Use: Never used  ?Substance and Sexual Activity  ? Alcohol use: No  ?  Comment: rare - holidays  ? Drug use: No  ? Sexual activity: Not on file  ?Other Topics Concern  ? Not on file  ?Social History Narrative  ? Lives in [place] with husband, has 2 children, 1 in college at [employer].  ?   ? One daughter with leukemia, in remission  ? Other daughter has DM I.   ?   ?   ? Work - Chiropractor  ? Diet - regular  ? Exercise - none recently  ? ?Social Determinants of Health  ? ?Financial Resource Strain: Not on file  ?Food Insecurity: Not on file  ?Transportation Needs: Not on file  ?Physical Activity: Not on file  ?Stress: Not on  file  ?Social Connections: Not on file  ?Intimate Partner Violence: Not on file  ? ? ?Current Outpatient Medications on File Prior to Visit  ?Medication Sig Dispense Refill  ? albuterol (PROVENTIL HFA;VENTOLIN HFA) 108 (90 Base) MCG/ACT inhaler Inhale 1-2 puffs into the lungs every 4 (four) hours as needed for wheezing or shortness of breath. 1 Inhaler 3  ? albuterol (VENTOLIN HFA) 108 (90 Base) MCG/ACT inhaler Inhale 2 puffs into the lungs every 6 (six) hours as needed for wheezing or shortness of breath. 8 g 0  ? amLODipine (NORVASC) 10 MG tablet TAKE ONE TABLET BY MOUTH ONCE DAILY 90 tablet 0  ? azelastine (ASTELIN) 0.1 % nasal spray Place 1 spray into both nostrils 2 (two) times daily. Use in each nostril as directed 30 mL 12  ? benzonatate (TESSALON) 100 MG capsule Take 1 capsule (100 mg total) by mouth 3 (three) times daily as needed. 30 capsule 0  ?  budesonide-formoterol (SYMBICORT) 160-4.5 MCG/ACT inhaler Inhale 2 puffs into the lungs 2 (two) times daily. 1 each 3  ? chlorpheniramine-HYDROcodone (TUSSIONEX PENNKINETIC ER) 10-8 MG/5ML Take 10 mLs by mouth daily as needed for cough. 115 mL 0  ? escitalopram (LEXAPRO) 10 MG tablet TAKE ONE TABLET BY MOUTH ONCE DAILY 90 tablet 1  ? fluticasone (FLONASE) 50 MCG/ACT nasal spray Place 2 sprays into both nostrils daily. 16 g 6  ? gabapentin (NEURONTIN) 300 MG capsule TAKE TWO CAPSULES BY MOUTH EVERY MORNING, ONE CAPSULE midday AND TWO CAPSULES EVERY EVENING 150 capsule 5  ? losartan (COZAAR) 25 MG tablet Take 1.5 tablets (37.5 mg total) by mouth daily. Take 1.5 tablet daily. 180 tablet 2  ? triamcinolone cream (KENALOG) 0.1 % Apply 1 application topically 4 (four) times daily as needed. 80 g 1  ? zolpidem (AMBIEN) 10 MG tablet Take 1 tablet (10 mg total) by mouth at bedtime as needed. for sleep 30 tablet 2  ? ?Current Facility-Administered Medications on File Prior to Visit  ?Medication Dose Route Frequency Provider Last Rate Last Admin  ? heparin lock flush 100 unit/mL  500 Units Intravenous Once Leia Alf, MD      ? sodium chloride 0.9 % injection 10 mL  10 mL Intravenous PRN Leia Alf, MD      ? sodium chloride 0.9 % injection 10 mL  10 mL Intravenous PRN Leia Alf, MD   10 mL at 11/28/14 1120  ? sodium chloride flush (NS) 0.9 % injection 10 mL  10 mL Intravenous PRN Evlyn Kanner, NP   10 mL at 01/03/16 1413  ? sodium chloride flush (NS) 0.9 % injection 10 mL  10 mL Intravenous PRN Lloyd Huger, MD   10 mL at 11/15/16 1111  ? sodium chloride flush (NS) 0.9 % injection 10 mL  10 mL Intravenous PRN Cammie Sickle, MD   10 mL at 11/24/17 1434  ? ? ?Allergies  ?Allergen Reactions  ? Lisinopril Hives and Rash  ? ? ?Family History  ?Problem Relation Age of Onset  ? Thyroid disease Mother   ? Arthritis Mother   ? Stroke Mother   ? Hypertension Mother   ? Diabetes Mother   ? Arthritis  Father   ? Heart disease Father 70  ?     s/p CABG  ? Stroke Father   ? Hypertension Father   ? Alcohol abuse Maternal Grandmother   ? Diabetes Maternal Grandmother   ? Alcohol abuse Maternal Grandfather   ? Diabetes Maternal Grandfather   ?  Breast cancer Paternal Grandmother   ? Cancer Paternal Grandmother   ?     breast  ? Heart disease Paternal Grandfather   ? Breast cancer Paternal Aunt 35  ? Colon cancer Neg Hx   ? ? ?BP (!) 144/82 (BP Location: Left Arm, Patient Position: Sitting, Cuff Size: Normal)   Pulse 91   Ht '5\' 2"'$  (1.575 m)   Wt 233 lb 3.2 oz (105.8 kg)   LMP 12/11/2013   SpO2 96%   BMI 42.65 kg/m?  ? ? ?Review of Systems ?Denies fever ?   ?Objective:  ? Physical Exam ?VS: see vs page ?GEN: no distress.   ?NECK: 5 cm right thyroid nodule is again noted.   ? ? ?Lab Results  ?Component Value Date  ? TSH <0.01 (L) 11/09/2021  ?Free T4=0.66 ?   ?Assessment & Plan:  ?Hyperthyroidism: improved.  Reduce tapazole to 20-BID.  Recheck TFT 30d.   ?Thyroid mass.  We discussed low risk of malignancy.  Update on AVS: she can go off tapazole x 2 weeks only, for nuc med scan if necessary.   ? ?

## 2021-11-09 NOTE — Patient Instructions (Signed)
Blood tests are requested for you today.  We'll let you know about the results.   ?If ever you have fever while taking methimazole, stop it and call us, even if the reason is obvious, because of the risk of a rare side-effect. ?It is best to never miss the medication.  However, if you do miss it, next best is to double up the next time.   ?It is not safe to stop the methimazole for the nuclear medicine scan until the thyroid blood tests are better.  If the thyroid is still overactive, you could have it done on the medication, but [facility] would not be willing to do this.   ?You should have an endocrinology follow-up appointment in 3 months.   ?

## 2021-11-13 ENCOUNTER — Other Ambulatory Visit: Payer: Self-pay | Admitting: Family

## 2021-11-19 ENCOUNTER — Encounter: Payer: Self-pay | Admitting: Endocrinology

## 2021-11-20 ENCOUNTER — Other Ambulatory Visit: Payer: Self-pay | Admitting: Family

## 2021-11-20 ENCOUNTER — Encounter: Payer: Self-pay | Admitting: Family

## 2021-11-20 DIAGNOSIS — R748 Abnormal levels of other serum enzymes: Secondary | ICD-10-CM

## 2021-11-26 ENCOUNTER — Ambulatory Visit: Payer: 59 | Admitting: Family

## 2021-12-05 ENCOUNTER — Other Ambulatory Visit: Payer: Self-pay | Admitting: Surgery

## 2021-12-05 DIAGNOSIS — E0590 Thyrotoxicosis, unspecified without thyrotoxic crisis or storm: Secondary | ICD-10-CM

## 2021-12-05 DIAGNOSIS — E041 Nontoxic single thyroid nodule: Secondary | ICD-10-CM

## 2021-12-19 ENCOUNTER — Other Ambulatory Visit: Payer: Self-pay | Admitting: Family

## 2021-12-19 DIAGNOSIS — I10 Essential (primary) hypertension: Secondary | ICD-10-CM

## 2022-01-16 ENCOUNTER — Encounter: Payer: Self-pay | Admitting: Family

## 2022-01-16 ENCOUNTER — Ambulatory Visit (INDEPENDENT_AMBULATORY_CARE_PROVIDER_SITE_OTHER): Payer: 59 | Admitting: Family

## 2022-01-16 VITALS — BP 128/86 | HR 66 | Temp 97.9°F | Ht 65.0 in | Wt 245.0 lb

## 2022-01-16 DIAGNOSIS — G4700 Insomnia, unspecified: Secondary | ICD-10-CM | POA: Diagnosis not present

## 2022-01-16 DIAGNOSIS — Z1231 Encounter for screening mammogram for malignant neoplasm of breast: Secondary | ICD-10-CM | POA: Diagnosis not present

## 2022-01-16 DIAGNOSIS — I10 Essential (primary) hypertension: Secondary | ICD-10-CM | POA: Diagnosis not present

## 2022-01-16 DIAGNOSIS — E041 Nontoxic single thyroid nodule: Secondary | ICD-10-CM

## 2022-01-16 DIAGNOSIS — R748 Abnormal levels of other serum enzymes: Secondary | ICD-10-CM

## 2022-01-16 LAB — TSH: TSH: 64.55 u[IU]/mL — ABNORMAL HIGH (ref 0.35–5.50)

## 2022-01-16 LAB — T4, FREE: Free T4: 0.27 ng/dL — ABNORMAL LOW (ref 0.60–1.60)

## 2022-01-16 NOTE — Patient Instructions (Signed)
Please let me know if vaginal bleeding persists   please call  and schedule your 3D mammogram and /or bone density scan as we discussed.   [facility]  ( new location in 2023)  [address], [place], [place] [address]  [place], [place]  [phone]   I have ordered transvaginal ultrasound.  Let us know if you dont hear back within a week in regards to an appointment being scheduled.   So that you are aware, if you are [facility] MyChart user , please pay attention to your MyChart messages as you may receive a MyChart message with a phone number to call and schedule this test/appointment own your own from our referral coordinator. This is a new process so I do not want you to miss this message.  If you are not a MyChart user, you will receive a phone call.   

## 2022-01-16 NOTE — Assessment & Plan Note (Signed)
She is scheduling NM thyroid uptake with Dr Harlow Asa. Establishing with Dr Dwyane Dee 01/2022 Compliant with methimazole '40mg'$ . Will follow.

## 2022-01-16 NOTE — Assessment & Plan Note (Signed)
NM whole body 11/08/21 without evidence of skeletal metastatic disease. Pending bone density and she will schedule.  alkaline phosphatase 167 ( prior 163). Will collaborate with Dr Dwyane Dee once we have results of DEXA. Will continue to follow.

## 2022-01-16 NOTE — Progress Notes (Signed)
Patient stated that she thinks she needs a CBC w differential

## 2022-01-16 NOTE — Assessment & Plan Note (Signed)
Chronic, stable.  Continue Ambien 10 mg

## 2022-01-16 NOTE — Progress Notes (Signed)
Subjective:    Patient ID: Tammie Robinson, female    DOB: 12/17/1965, 56 y.o.   MRN: [id number]  CC: Tammie Robinson is a 56 y.o. female who presents today for follow up.   HPI: Well today.  No new complaints   HTN- compliant with amlodipine 10 mg, losartan 25 mg Insomnia-compliant with his Ambien 10 mg . She is sleeping well.  elevated alk phos-NM whole body 11/08/21 without evidence of skeletal metastatic disease  She plans to schedule bone density  Appointment to establish with Dr Tammie Robinson next month  Thyroid biopsy with Dr Tammie Robinson 10/2021. She is scheduling NM thyroid update. Compliant with methimazole 54m.         HISTORY:  Past Medical History:  Diagnosis Date   Anemia    prior to CA dx   Asthma    seasonal,never hospitalized   Blood in stool    Col[facility] in [facility] ([facility]    History of ERCP    Hypertension    Migraine    Neuropathy    feet and hands due to chemo   PTSD (post-traumatic stress disorder)    Rectal cancer ([facility])    Systolic murmur    Wears contact lenses    Past Surgical History:  Procedure Laterality Date   ABDOMINAL HYSTERECTOMY  04/12/2014   [facility] with CA surgery; no cervix ( Tammie Robinson 01/2017)   CHOLECYSTECTOMY  2012   Dr. SJamal Robinson  COLONOSCOPY WITH PROPOFOL N/A 01/26/2016   Procedure: COLONOSCOPY WITH PROPOFOL through colostomy;  Surgeon: DLucilla Lame MD;  [place]  Service: Endoscopy;  Laterality: N/A;  port-a-cath   COLONOSCOPY WITH PROPOFOL N/A 11/29/2019   Procedure: COLONOSCOPY WITH PROPOFOL;  Surgeon: WLucilla Lame MD;  [place]  Service: Endoscopy;  Laterality: N/A;   POLYPECTOMY  01/26/2016   Procedure: POLYPECTOMY;  Surgeon: DLucilla Lame MD;  [place]  Service: Endoscopy;;   POLYPECTOMY N/A 11/29/2019   Procedure: POLYPECTOMY;  Surgeon: WLucilla Lame MD;  [place]  Service: Endoscopy;  Laterality: N/A;   PORTA CATH REMOVAL N/A 01/28/2018   Procedure:  PORTA CATH REMOVAL;  Surgeon: DAlgernon Huxley MD;  [facility];  Service: Cardiovascular;  Laterality: N/A;   PORTACATH PLACEMENT     PROCTECTOMY  04/12/14   [facility] - CA surgery - with colostomy   SALPINGOOPHORECTOMY Bilateral 04/12/14   [facility] - with CA surg   VAGINA RECONSTRUCTION SURGERY  04/12/14   [facility] - transabdonimal rectus abdominus muscle reconstruction   VAGINAL DELIVERY     2, preterm labor   Family History  Problem Relation Age of Onset   Thyroid disease Mother    Arthritis Mother    Stroke Mother    Hypertension Mother    Diabetes Mother    Arthritis Father    Heart disease Father 663      s/p CABG   Stroke Father    Hypertension Father    Alcohol abuse Maternal Grandmother    Diabetes Maternal Grandmother    Alcohol abuse Maternal Grandfather    Diabetes Maternal Grandfather    Breast cancer Paternal Grandmother    Cancer Paternal Grandmother        breast   Heart disease Paternal Grandfather    Breast cancer Paternal Aunt [phone]  Colon cancer Neg Hx     Allergies: Lisinopril Current Outpatient Medications on File Prior to Visit  Medication Sig Dispense Refill   albuterol (  PROVENTIL HFA;VENTOLIN HFA) 108 (90 Base) MCG/ACT inhaler Inhale 1-2 puffs into the lungs every 4 (four) hours as needed for wheezing or shortness of breath. 1 Inhaler 3   albuterol (VENTOLIN HFA) 108 (90 Base) MCG/ACT inhaler Inhale 2 puffs into the lungs every 6 (six) hours as needed for wheezing or shortness of breath. 8 g 0   amLODipine (NORVASC) 10 MG tablet TAKE (1) TABLET DAILY. 90 tablet 1   azelastine (ASTELIN) 0.1 % nasal spray Place 1 spray into both nostrils 2 (two) times daily. Use in each nostril as directed 30 mL 12   budesonide-formoterol (SYMBICORT) 160-4.5 MCG/ACT inhaler Inhale 2 puffs into the lungs 2 (two) times daily. 1 each 3   escitalopram (LEXAPRO) 10 MG tablet TAKE ONE TABLET BY MOUTH ONCE DAILY 90 tablet 1   fluticasone (FLONASE) 50 MCG/ACT nasal spray  Place 2 sprays into both nostrils daily. 16 g 6   gabapentin (NEURONTIN) 300 MG capsule TAKE TWO CAPSULES BY MOUTH EVERY MORNING, ONE CAPSULE midday AND TWO CAPSULES EVERY EVENING 150 capsule 5   losartan (COZAAR) 25 MG tablet Take 1.5 tablets (37.5 mg total) by mouth daily. Take 1.5 tablet daily. 180 tablet 2   methimazole (TAPAZOLE) 10 MG tablet Take 2 tablets (20 mg total) by mouth 2 (two) times daily. 360 tablet 1   triamcinolone cream (KENALOG) 0.1 % Apply 1 application topically 4 (four) times daily as needed. 80 g 1   zolpidem (AMBIEN) 10 MG tablet Take 1 tablet (10 mg total) by mouth at bedtime as needed. for sleep 30 tablet 2   Current Facility-Administered Medications on File Prior to Visit  Medication Dose Route Frequency Provider Last Rate Last Admin   heparin lock flush 100 unit/mL  500 Units Intravenous Once Tammie Alf, MD       sodium chloride 0.9 % injection 10 mL  10 mL Intravenous PRN Tammie Alf, MD       sodium chloride 0.9 % injection 10 mL  10 mL Intravenous PRN Tammie Alf, MD   10 mL at 11/28/14 1120   sodium chloride flush (NS) 0.9 % injection 10 mL  10 mL Intravenous PRN Tammie Kanner, NP   10 mL at 01/03/16 1413   sodium chloride flush (NS) 0.9 % injection 10 mL  10 mL Intravenous PRN Tammie Huger, MD   10 mL at 11/15/16 1111   sodium chloride flush (NS) 0.9 % injection 10 mL  10 mL Intravenous PRN Cammie Sickle, MD   10 mL at 11/24/17 1434    Social History   Tobacco Use   Smoking status: Never   Smokeless tobacco: Never  Vaping Use   Vaping Use: Never used  Substance Use Topics   Alcohol use: No    Comment: rare - holidays   Drug use: No    Review of Systems  Constitutional:  Negative for chills and fever.  Respiratory:  Negative for cough.   Cardiovascular:  Negative for chest pain and palpitations.  Gastrointestinal:  Negative for nausea and vomiting.  Psychiatric/Behavioral:  Negative for sleep disturbance.        Objective:    BP 128/86 (BP Location: Left Arm, Patient Position: Sitting, Cuff Size: Large)   Pulse 66   Temp 97.9 F (36.6 C) (Oral)   Ht 5' 5" (1.651 m)   Wt 245 lb (111.1 kg)   LMP 12/11/2013   SpO2 98%   BMI 40.77 kg/m  BP Readings from Last 3 Encounters:  01/16/22 128/86  11/09/21 (!) 144/82  10/19/21 130/72   Wt Readings from Last 3 Encounters:  01/16/22 245 lb (111.1 kg)  11/09/21 233 lb 3.2 oz (105.8 kg)  10/19/21 230 lb (104.3 kg)    Physical Exam Vitals reviewed.  Constitutional:      Appearance: She is well-developed.  Eyes:     Conjunctiva/sclera: Conjunctivae normal.  Cardiovascular:     Rate and Rhythm: Normal rate and regular rhythm.     Pulses: Normal pulses.     Heart sounds: Normal heart sounds.  Pulmonary:     Effort: Pulmonary effort is normal.     Breath sounds: Normal breath sounds. No wheezing, rhonchi or rales.  Skin:    General: Skin is warm and dry.  Neurological:     Mental Status: She is alert.  Psychiatric:        Speech: Speech normal.        Behavior: Behavior normal.        Thought Content: Thought content normal.        Assessment & Plan:   Problem List Items Addressed This Visit       Cardiovascular and Mediastinum   Hypertension    Chronic, stable. Continue amlodipine 10 mg, losartan 25 mg        Endocrine   Thyroid nodule - Primary    She is scheduling NM thyroid uptake with Dr Tammie Robinson. Establishing with Dr Tammie Robinson 01/2022 Compliant with methimazole 74m. Will follow.       Relevant Orders   TSH   T4, free     Other   Elevated alkaline phosphatase level    NM whole body 11/08/21 without evidence of skeletal metastatic disease. Pending bone density and she will schedule.  alkaline phosphatase 167 ( prior 163). Will collaborate with Dr KDwyane Deeonce we have results of DEXA. Will continue to follow.        Insomnia    Chronic, stable.  Continue Ambien 10 mg      Screening for breast cancer   Relevant Orders    MM 3D SCREEN BREAST BILATERAL     I have discontinued Tammie Robinson's benzonatate and chlorpheniramine-HYDROcodone. I am also having her maintain her albuterol, escitalopram, losartan, azelastine, triamcinolone cream, albuterol, fluticasone, budesonide-formoterol, gabapentin, zolpidem, methimazole, and amLODipine.   No orders of the defined types were placed in this encounter.   Return precautions given.   Risks, benefits, and alternatives of the medications and treatment plan prescribed today were discussed, and patient expressed understanding.   Education regarding symptom management and diagnosis given to patient on AVS.  Continue to follow with ABurnard Hawthorne FNP for routine health maintenance.   TLenard Simmerand I agreed with plan.   MMable Paris FNP

## 2022-01-16 NOTE — Assessment & Plan Note (Signed)
Chronic, stable. Continue amlodipine 10 mg, losartan 25 mg

## 2022-01-18 ENCOUNTER — Encounter: Payer: Self-pay | Admitting: Endocrinology

## 2022-01-21 ENCOUNTER — Other Ambulatory Visit: Payer: Self-pay | Admitting: Endocrinology

## 2022-01-21 DIAGNOSIS — E041 Nontoxic single thyroid nodule: Secondary | ICD-10-CM

## 2022-01-21 DIAGNOSIS — R7989 Other specified abnormal findings of blood chemistry: Secondary | ICD-10-CM

## 2022-02-02 ENCOUNTER — Other Ambulatory Visit: Payer: Self-pay | Admitting: Endocrinology

## 2022-02-06 ENCOUNTER — Other Ambulatory Visit (INDEPENDENT_AMBULATORY_CARE_PROVIDER_SITE_OTHER): Payer: 59

## 2022-02-06 DIAGNOSIS — R7989 Other specified abnormal findings of blood chemistry: Secondary | ICD-10-CM

## 2022-02-06 DIAGNOSIS — E041 Nontoxic single thyroid nodule: Secondary | ICD-10-CM | POA: Diagnosis not present

## 2022-02-06 LAB — T4, FREE: Free T4: 1.43 ng/dL (ref 0.60–1.60)

## 2022-02-06 LAB — TSH: TSH: 0.03 u[IU]/mL — ABNORMAL LOW (ref 0.35–5.50)

## 2022-02-12 ENCOUNTER — Other Ambulatory Visit: Payer: Self-pay

## 2022-02-12 ENCOUNTER — Encounter: Payer: Self-pay | Admitting: Endocrinology

## 2022-02-12 ENCOUNTER — Ambulatory Visit: Payer: 59 | Admitting: Endocrinology

## 2022-02-12 VITALS — BP 144/72 | HR 97 | Ht 65.0 in | Wt 244.4 lb

## 2022-02-12 DIAGNOSIS — E0590 Thyrotoxicosis, unspecified without thyrotoxic crisis or storm: Secondary | ICD-10-CM

## 2022-02-12 DIAGNOSIS — E041 Nontoxic single thyroid nodule: Secondary | ICD-10-CM | POA: Diagnosis not present

## 2022-02-12 NOTE — Progress Notes (Signed)
Patient ID: Tammie Robinson, female   DOB: Sep 06, 1965, 56 y.o.   MRN: [id number]                                                                                                               Reason for Appointment:  Hyperthyroidism    Chief complaint: Feeling warm   History of Present Illness:   At the time of diagnosis she was having some symptoms of feeling warm, sweating and shakiness.  May have also started losing weight.  Did not have any significant palpitations Her PCP currently was doing screening lab work and had abnormal thyroid levels Initial abnormal free T4 was 3.1 as of 04/18/2021, previously TSH was undetectable on 03/27/2021 and prior to that normal   She was started on methimazole for her hyperthyroidism in 08/2021 initially with 80 mg a day With this her symptoms improved and methimazole was reduced subsequently  More recent history: In June she started feeling cold and having some joint pains, may have been a little more tired and had a weight gain of about 12 pounds  Since her thyroid levels showed hypothyroidism on 6/28 her methimazole has been on hold Prior to that she was taking 20 mg twice a day  She had also been evaluated by the thyroid surgeon and recommended thyroid scan for her right-sided thyroid nodule but she still has not scheduled this  Wt Readings from Last 3 Encounters:  02/12/22 244 lb 6.4 oz (110.9 kg)  01/16/22 245 lb (111.1 kg)  11/09/21 233 lb 3.2 oz (105.8 kg)    Thyroid function tests as follows:     Lab Results  Component Value Date   FREET4 1.43 02/06/2022   FREET4 0.27 (L) 01/16/2022   FREET4 0.66 11/09/2021   T3FREE 6.4 (H) 04/18/2021   TSH 0.03 (L) 02/06/2022   TSH 64.55 (H) 01/16/2022   TSH <0.01 (L) 11/09/2021    No results found for: "THYROTRECAB"  THYROID NODULE  This was initially diagnosed in 10/22 and subsequently benign on biopsy as seen on the Afirma testing  Ultrasound exam of 05/02/2021 showed the  following  1. Borderline thyromegaly with findings suggestive of multinodular goiter. 2. Nodule #1 measuring 3.8 cm meets imaging criteria to recommend percutaneous sampling as indicated  Allergies as of 02/12/2022       Reactions   Lisinopril Hives, Rash        Medication List        Accurate as of February 12, 2022 10:53 AM. If you have any questions, ask your nurse or doctor.          albuterol 108 (90 Base) MCG/ACT inhaler Commonly known as: VENTOLIN HFA Inhale 1-2 puffs into the lungs every 4 (four) hours as needed for wheezing or shortness of breath.   albuterol 108 (90 Base) MCG/ACT inhaler Commonly known as: VENTOLIN HFA Inhale 2 puffs into the lungs every 6 (six) hours as needed for wheezing or shortness of breath.   amLODipine 10 MG tablet Commonly known as: NORVASC  TAKE (1) TABLET DAILY.   azelastine 0.1 % nasal spray Commonly known as: ASTELIN Place 1 spray into both nostrils 2 (two) times daily. Use in each nostril as directed   budesonide-formoterol 160-4.5 MCG/ACT inhaler Commonly known as: SYMBICORT Inhale 2 puffs into the lungs 2 (two) times daily.   escitalopram 10 MG tablet Commonly known as: LEXAPRO TAKE ONE TABLET BY MOUTH ONCE DAILY   fluticasone 50 MCG/ACT nasal spray Commonly known as: FLONASE Place 2 sprays into both nostrils daily.   gabapentin 300 MG capsule Commonly known as: NEURONTIN TAKE TWO CAPSULES BY MOUTH EVERY MORNING, ONE CAPSULE midday AND TWO CAPSULES EVERY EVENING   losartan 25 MG tablet Commonly known as: COZAAR Take 1.5 tablets (37.5 mg total) by mouth daily. Take 1.5 tablet daily.   methimazole 10 MG tablet Commonly known as: TAPAZOLE Take 2 tablets (20 mg total) by mouth 2 (two) times daily.   triamcinolone cream 0.1 % Commonly known as: KENALOG Apply 1 application topically 4 (four) times daily as needed.   zolpidem 10 MG tablet Commonly known as: AMBIEN Take 1 tablet (10 mg total) by mouth at bedtime as  needed. for sleep            Past Medical History:  Diagnosis Date   Anemia    prior to CA dx   Asthma    seasonal,never hospitalized   Blood in stool    Colostomy in place 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 Jamal Collin   COLONOSCOPY WITH PROPOFOL N/A 01/26/2016   Procedure: COLONOSCOPY WITH PROPOFOL through colostomy;  Surgeon: Lucilla Lame, MD;  Location: [place];  Service: Endoscopy;  Laterality: N/A;  port-a-cath   COLONOSCOPY WITH PROPOFOL N/A 11/29/2019   Procedure: COLONOSCOPY WITH PROPOFOL;  Surgeon: Lucilla Lame, MD;  Location: [place];  Service: Endoscopy;  Laterality: N/A;   POLYPECTOMY  01/26/2016   Procedure: POLYPECTOMY;  Surgeon: Lucilla Lame, MD;  Location: [place];  Service: Endoscopy;;   POLYPECTOMY N/A 11/29/2019   Procedure: POLYPECTOMY;  Surgeon: Lucilla Lame, MD;  Location: [place];  Service: Endoscopy;  Laterality: N/A;   PORTA CATH REMOVAL N/A 01/28/2018   Procedure: PORTA CATH REMOVAL;  Surgeon: Algernon Huxley, MD;  Location: [place] CV LAB;  Service: Cardiovascular;  Laterality: N/A;   PORTACATH PLACEMENT     PROCTECTOMY  04/12/14   [facility] - CA surgery - with colostomy   SALPINGOOPHORECTOMY Bilateral 04/12/14   [facility] - with CA surg   VAGINA RECONSTRUCTION SURGERY  04/12/14   [facility] - transabdonimal rectus abdominus muscle reconstruction   VAGINAL DELIVERY     2, preterm labor    Family History  Problem Relation Age of Onset   Thyroid disease Mother    Arthritis Mother    Stroke Mother    Hypertension Mother    Diabetes Mother    Arthritis Father    Heart disease  Father 71       s/p CABG   Stroke Father    Hypertension Father  Alcohol abuse Maternal Grandmother    Diabetes Maternal Grandmother    Alcohol abuse Maternal Grandfather    Diabetes Maternal Grandfather    Breast cancer Paternal Grandmother    Cancer Paternal Grandmother        breast   Heart disease Paternal Grandfather    Breast cancer Paternal Aunt 70   Colon cancer Neg Hx     Social History:  reports that she has never smoked. She has never used smokeless tobacco. She reports that she does not drink alcohol and does not use drugs.  Allergies:  Allergies  Allergen Reactions   Lisinopril Hives and Rash     Review of Systems  Constitutional:  Positive for weight gain.  Gastrointestinal:  Negative for constipation and diarrhea.  Endocrine: Positive for heat intolerance.  Musculoskeletal:  Positive for joint pain.  Psychiatric/Behavioral:  Positive for nervousness.   Hypertension history:  BP Readings from Last 3 Encounters:  02/12/22 (!) 144/72  01/16/22 128/86  11/09/21 (!) 144/82   No history of leukopenia recently  Lab Results  Component Value Date   WBC 5.6 10/11/2021   HGB 12.3 10/11/2021   HCT 37.3 10/11/2021   MCV 79.5 10/11/2021   PLT 211.0 10/11/2021      Examination:   BP (!) 144/72   Pulse 97   Ht '5\' 5"'$  (1.651 m)   Wt 244 lb 6.4 oz (110.9 kg)   LMP 12/11/2013   SpO2 98%   BMI 40.67 kg/m    General Appearance:  well-built and nourished, pleasant, not anxious or hyperkinetic.         Eyes: No abnormal prominence, no stare present.  No swelling of the eyelids   Neck: The thyroid is palpable laterally on the right side, relatively soft and indistinct, appears to be about a 3-4 cm nodule better felt on swallowing.  This is smooth Left lobe not palpable  There is no lymphadenopathy in the neck .            Extremities: hands are slightly warm. No ankle edema.  Neurological:  No fine tremors are present. Deep tendon reflexes at biceps  are brisk.  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 All questions were answered satisfactorily    Elayne Snare 02/12/2022, 10:53 AM    Note: This office note was prepared with Dragon voice recognition system technology. Any transcriptional errors that result from this process are unintentional.

## 2022-02-22 ENCOUNTER — Encounter: Payer: Self-pay | Admitting: Family

## 2022-02-25 ENCOUNTER — Other Ambulatory Visit: Payer: Self-pay | Admitting: Family

## 2022-02-25 DIAGNOSIS — G4700 Insomnia, unspecified: Secondary | ICD-10-CM

## 2022-02-25 MED ORDER — ZOLPIDEM TARTRATE 10 MG PO TABS: 10.0000 mg | ORAL_TABLET | Freq: Every evening | ORAL | 2 refills | Status: DC | PRN

## 2022-02-25 NOTE — Progress Notes (Signed)
I looked up patient on [place] Controlled Substances Reporting System PMP AWARE and saw no activity that raised concern of inappropriate use.   

## 2022-03-01 ENCOUNTER — Encounter: Payer: Self-pay | Admitting: Endocrinology

## 2022-03-04 ENCOUNTER — Ambulatory Visit
Admission: RE | Admit: 2022-03-04 | Discharge: 2022-03-04 | Disposition: A | Discharge status: Home / Self Care | Payer: 59 | Source: Ambulatory Visit | Attending: Surgery | Admitting: Surgery

## 2022-03-04 ENCOUNTER — Encounter
Admission: RE | Admit: 2022-03-04 | Discharge: 2022-03-04 | Disposition: A | Discharge status: Home / Self Care | Payer: 59 | Source: Ambulatory Visit | Attending: Surgery | Admitting: Surgery

## 2022-03-04 DIAGNOSIS — E041 Nontoxic single thyroid nodule: Secondary | ICD-10-CM

## 2022-03-04 DIAGNOSIS — E0590 Thyrotoxicosis, unspecified without thyrotoxic crisis or storm: Secondary | ICD-10-CM | POA: Insufficient documentation

## 2022-03-04 MED ORDER — SODIUM IODIDE I-123 7.4 MBQ CAPS
304.5000 | ORAL_CAPSULE | Freq: Once | ORAL | Status: AC
  Administered 2022-03-04: 304.5 via ORAL

## 2022-03-05 ENCOUNTER — Encounter
Admission: RE | Admit: 2022-03-05 | Discharge: 2022-03-05 | Disposition: A | Discharge status: Home / Self Care | Payer: 59 | Source: Ambulatory Visit | Attending: Surgery | Admitting: Surgery

## 2022-03-05 DIAGNOSIS — E041 Nontoxic single thyroid nodule: Secondary | ICD-10-CM | POA: Diagnosis not present

## 2022-03-05 DIAGNOSIS — E0590 Thyrotoxicosis, unspecified without thyrotoxic crisis or storm: Secondary | ICD-10-CM | POA: Diagnosis not present

## 2022-03-11 ENCOUNTER — Other Ambulatory Visit: Payer: Self-pay | Admitting: Family

## 2022-03-11 DIAGNOSIS — F419 Anxiety disorder, unspecified: Secondary | ICD-10-CM

## 2022-03-13 NOTE — Progress Notes (Signed)
FNA biopsy with [facility] of right sided nodule is benign.  Good news.  Nuclear scan shows this is a cold nodule, and therefore not responsible for the patient's hyperthyroidism.  She is therefore a candidate for RAI treatment of her hyperthyroidism if desired.  I left her a message and recommended follow up with endocrinology to discuss RAI treatment versus surgery.  While Dr. Loanne Drilling has retired, she could see Dr. Kelton Pillar or Dr. Cruzita Lederer.  I'll copy Dr. Kelton Pillar on this note.  I'll be available if needed.  [place], MD [facility] [place] [facility] Office: [phone]

## 2022-03-20 NOTE — Telephone Encounter (Signed)
Spoke to patient and she stated that she was better so no need for triage or appt at this time.

## 2022-03-27 ENCOUNTER — Encounter: Payer: Self-pay | Admitting: Family

## 2022-03-28 ENCOUNTER — Telehealth: Payer: Self-pay | Admitting: Family

## 2022-03-28 NOTE — Telephone Encounter (Signed)
Patient has a lab appt 04/01/22, there are no orders in.

## 2022-03-29 ENCOUNTER — Other Ambulatory Visit: Payer: Self-pay | Admitting: Family

## 2022-03-29 ENCOUNTER — Other Ambulatory Visit: Payer: Self-pay

## 2022-03-29 DIAGNOSIS — Z0000 Encounter for general adult medical examination without abnormal findings: Secondary | ICD-10-CM

## 2022-03-29 DIAGNOSIS — R7989 Other specified abnormal findings of blood chemistry: Secondary | ICD-10-CM

## 2022-03-29 DIAGNOSIS — I10 Essential (primary) hypertension: Secondary | ICD-10-CM

## 2022-03-29 NOTE — Telephone Encounter (Signed)
Please order cpe lab set

## 2022-03-29 NOTE — Telephone Encounter (Signed)
Will do!

## 2022-04-01 ENCOUNTER — Encounter: Payer: Self-pay | Admitting: Endocrinology

## 2022-04-01 ENCOUNTER — Other Ambulatory Visit (INDEPENDENT_AMBULATORY_CARE_PROVIDER_SITE_OTHER): Payer: 59

## 2022-04-01 ENCOUNTER — Encounter: Payer: Self-pay | Admitting: Family

## 2022-04-01 DIAGNOSIS — Z0000 Encounter for general adult medical examination without abnormal findings: Secondary | ICD-10-CM

## 2022-04-01 DIAGNOSIS — R7989 Other specified abnormal findings of blood chemistry: Secondary | ICD-10-CM | POA: Diagnosis not present

## 2022-04-01 DIAGNOSIS — I10 Essential (primary) hypertension: Secondary | ICD-10-CM | POA: Diagnosis not present

## 2022-04-01 LAB — COMPREHENSIVE METABOLIC PANEL
ALT: 19 U/L (ref 0–35)
AST: 16 U/L (ref 0–37)
Albumin: 3.9 g/dL (ref 3.5–5.2)
Alkaline Phosphatase: 179 U/L — ABNORMAL HIGH (ref 39–117)
BUN: 13 mg/dL (ref 6–23)
CO2: 30 mEq/L (ref 19–32)
Calcium: 8.9 mg/dL (ref 8.4–10.5)
Chloride: 105 mEq/L (ref 96–112)
Creatinine, Ser: 0.96 mg/dL (ref 0.40–1.20)
GFR: 66.02 mL/min (ref >60.00)
Glucose, Bld: 95 mg/dL (ref 70–99)
Potassium: 4.3 mEq/L (ref 3.5–5.1)
Sodium: 140 mEq/L (ref 135–145)
Total Bilirubin: 0.6 mg/dL (ref 0.2–1.2)
Total Protein: 6.5 g/dL (ref 6.0–8.3)

## 2022-04-01 LAB — CBC WITH DIFFERENTIAL/PLATELET
Basophils Absolute: 0.1 10*3/uL (ref 0.0–0.1)
Basophils Relative: 1 % (ref 0.0–3.0)
Eosinophils Absolute: 0.4 10*3/uL (ref 0.0–0.7)
Eosinophils Relative: 7.1 % — ABNORMAL HIGH (ref 0.0–5.0)
HCT: 39.4 % (ref 36.0–46.0)
Hemoglobin: 13 g/dL (ref 12.0–15.0)
Lymphocytes Relative: 27 % (ref 12.0–46.0)
Lymphs Abs: 1.6 10*3/uL (ref 0.7–4.0)
MCHC: 33.1 g/dL (ref 30.0–36.0)
MCV: 82.4 fl (ref 78.0–100.0)
Monocytes Absolute: 0.4 10*3/uL (ref 0.1–1.0)
Monocytes Relative: 7 % (ref 3.0–12.0)
Neutro Abs: 3.4 10*3/uL (ref 1.4–7.7)
Neutrophils Relative %: 57.9 % (ref 43.0–77.0)
Platelets: 201 10*3/uL (ref 150.0–400.0)
RBC: 4.79 Mil/uL (ref 3.87–5.11)
RDW: 13 % (ref 11.5–15.5)
WBC: 5.8 10*3/uL (ref 4.0–10.5)

## 2022-04-01 LAB — VITAMIN D 25 HYDROXY (VIT D DEFICIENCY, FRACTURES): VITD: 9.24 ng/mL — ABNORMAL LOW (ref 30.00–100.00)

## 2022-04-01 LAB — LIPID PANEL
Cholesterol: 135 mg/dL (ref 0–200)
HDL: 42.2 mg/dL (ref >39.00)
LDL Cholesterol: 71 mg/dL (ref 0–99)
NonHDL: 92.61
Total CHOL/HDL Ratio: 3
Triglycerides: 108 mg/dL (ref 0.0–149.0)
VLDL: 21.6 mg/dL (ref 0.0–40.0)

## 2022-04-01 LAB — HEMOGLOBIN A1C: Hgb A1c MFr Bld: 5.5 % (ref 4.6–6.5)

## 2022-04-01 LAB — TSH: TSH: 0.06 u[IU]/mL — ABNORMAL LOW (ref 0.35–5.50)

## 2022-04-02 ENCOUNTER — Other Ambulatory Visit: Payer: Self-pay | Admitting: Family

## 2022-04-02 DIAGNOSIS — E041 Nontoxic single thyroid nodule: Secondary | ICD-10-CM

## 2022-04-03 ENCOUNTER — Other Ambulatory Visit: Payer: 59

## 2022-04-03 ENCOUNTER — Other Ambulatory Visit: Payer: Self-pay | Admitting: Endocrinology

## 2022-04-03 DIAGNOSIS — E0590 Thyrotoxicosis, unspecified without thyrotoxic crisis or storm: Secondary | ICD-10-CM

## 2022-04-04 ENCOUNTER — Other Ambulatory Visit (INDEPENDENT_AMBULATORY_CARE_PROVIDER_SITE_OTHER): Payer: 59

## 2022-04-04 ENCOUNTER — Other Ambulatory Visit: Payer: 59

## 2022-04-04 ENCOUNTER — Other Ambulatory Visit: Payer: Self-pay

## 2022-04-04 DIAGNOSIS — E0590 Thyrotoxicosis, unspecified without thyrotoxic crisis or storm: Secondary | ICD-10-CM | POA: Diagnosis not present

## 2022-04-04 LAB — TSH: TSH: 0.17 u[IU]/mL — ABNORMAL LOW (ref 0.35–5.50)

## 2022-04-04 NOTE — Addendum Note (Signed)
Addended by: Kaylyn Lim I on: 04/04/2022 08:16 AM   Modules accepted: Orders

## 2022-04-04 NOTE — Addendum Note (Signed)
Addended by: Kaylyn Lim I on: 04/04/2022 08:15 AM   Modules accepted: Orders

## 2022-04-04 NOTE — Progress Notes (Unsigned)
Patient ID: Tammie Robinson, female   DOB: [birth year], 56 y.o.   MRN: [id number]                                                                                                               Reason for Appointment:  Hyperthyroidism    Chief complaint: Feeling warm   History of Present Illness:   At the time of diagnosis she was having some symptoms of feeling warm, sweating and shakiness.  May have also started losing weight.  Did not have any significant palpitations Her PCP currently was doing screening lab work and had abnormal thyroid levels Initial abnormal free T4 was 3.1 as of 04/18/2021, previously TSH was undetectable on 03/27/2021 and prior to that normal   She was started on methimazole for her hyperthyroidism in 08/2021 initially with 80 mg a day With this her symptoms improved and methimazole was reduced subsequently  More recent history: In June she started feeling cold and having some joint pains, may have been a little more tired and had a weight gain of about 12 pounds  Since her thyroid levels showed hypothyroidism on 6/28 her methimazole has been on hold Prior to that she was taking 20 mg twice a day  She had also been evaluated by the thyroid surgeon and recommended thyroid scan for her right-sided thyroid nodule but she still has not scheduled this  Wt Readings from Last 3 Encounters:  02/12/22 244 lb 6.4 oz (110.9 kg)  01/16/22 245 lb (111.1 kg)  11/09/21 233 lb 3.2 oz (105.8 kg)    Thyroid function tests as follows:     Lab Results  Component Value Date   FREET4 1.43 02/06/2022   FREET4 0.27 (L) 01/16/2022   FREET4 0.66 11/09/2021   T3FREE 6.4 (H) 04/18/2021   TSH 0.17 (L) 04/04/2022   TSH 0.06 (L) 04/01/2022   TSH 0.03 (L) 02/06/2022    No results found for: "THYROTRECAB"  THYROID NODULE  This was initially diagnosed in 10/22 and subsequently benign on biopsy as seen on the Afirma testing  Ultrasound exam of 05/02/2021 showed the following  1.  Borderline thyromegaly with findings suggestive of multinodular goiter. 2. Nodule #1 measuring 3.8 cm meets imaging criteria to recommend percutaneous sampling as indicated  Allergies as of 04/05/2022       Reactions   Lisinopril Hives, Rash        Medication List        Accurate as of April 04, 2022  8:50 PM. If you have any questions, ask your nurse or doctor.          albuterol 108 (90 Base) MCG/ACT inhaler Commonly known as: VENTOLIN HFA Inhale 1-2 puffs into the lungs every 4 (four) hours as needed for wheezing or shortness of breath.   albuterol 108 (90 Base) MCG/ACT inhaler Commonly known as: VENTOLIN HFA Inhale 2 puffs into the lungs every 6 (six) hours as needed for wheezing or shortness of breath.   amLODipine 10 MG tablet Commonly known as:  NORVASC TAKE (1) TABLET DAILY.   azelastine 0.1 % nasal spray Commonly known as: ASTELIN Place 1 spray into both nostrils 2 (two) times daily. Use in each nostril as directed   budesonide-formoterol 160-4.5 MCG/ACT inhaler Commonly known as: SYMBICORT Inhale 2 puffs into the lungs 2 (two) times daily.   escitalopram 10 MG tablet Commonly known as: LEXAPRO TAKE 1 TABLET DAILY.   fluticasone 50 MCG/ACT nasal spray Commonly known as: FLONASE Place 2 sprays into both nostrils daily.   gabapentin 300 MG capsule Commonly known as: NEURONTIN TAKE TWO CAPSULES BY MOUTH EVERY MORNING, ONE CAPSULE midday AND TWO CAPSULES EVERY EVENING   losartan 25 MG tablet Commonly known as: COZAAR Take 1.5 tablets (37.5 mg total) by mouth daily. Take 1.5 tablet daily.   methimazole 10 MG tablet Commonly known as: TAPAZOLE Take 2 tablets (20 mg total) by mouth 2 (two) times daily.   triamcinolone cream 0.1 % Commonly known as: KENALOG Apply 1 application topically 4 (four) times daily as needed.   zolpidem 10 MG tablet Commonly known as: AMBIEN Take 1 tablet (10 mg total) by mouth at bedtime as needed. 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 Jamal Collin   COLONOSCOPY WITH PROPOFOL N/A 01/26/2016   Procedure: COLONOSCOPY WITH PROPOFOL through colostomy;  Surgeon: Lucilla Lame, MD;  Location: [place];  Service: Endoscopy;  Laterality: N/A;  port-a-cath   COLONOSCOPY WITH PROPOFOL N/A 11/29/2019   Procedure: COLONOSCOPY WITH PROPOFOL;  Surgeon: Lucilla Lame, MD;  Location: [place];  Service: Endoscopy;  Laterality: N/A;   POLYPECTOMY  01/26/2016   Procedure: POLYPECTOMY;  Surgeon: Lucilla Lame, MD;  Location: [place];  Service: Endoscopy;;   POLYPECTOMY N/A 11/29/2019   Procedure: POLYPECTOMY;  Surgeon: Lucilla Lame, MD;  Location: [place];  Service: Endoscopy;  Laterality: N/A;   PORTA CATH REMOVAL N/A 01/28/2018   Procedure: PORTA CATH REMOVAL;  Surgeon: Algernon Huxley, MD;  Location: [place] CV LAB;  Service: Cardiovascular;  Laterality: N/A;   PORTACATH PLACEMENT     PROCTECTOMY  04/12/14   [facility] - CA surgery - with colostomy   SALPINGOOPHORECTOMY Bilateral 04/12/14   [facility] - with CA surg   VAGINA RECONSTRUCTION SURGERY  04/12/14   [facility] - transabdonimal rectus abdominus muscle reconstruction   VAGINAL DELIVERY     2, preterm labor    Family History  Problem Relation Age of Onset   Thyroid disease Mother    Arthritis Mother    Stroke Mother    Hypertension Mother    Diabetes Mother    Arthritis Father    Heart disease Father 73       s/p CABG    Stroke Father    Hypertension Father  Alcohol abuse Maternal Grandmother    Diabetes Maternal Grandmother    Alcohol abuse Maternal Grandfather    Diabetes Maternal Grandfather    Breast cancer Paternal Grandmother    Cancer Paternal Grandmother        breast   Heart disease Paternal Grandfather    Breast cancer Paternal Aunt 70   Colon cancer Neg Hx     Social History:  reports that she has never smoked. She has never used smokeless tobacco. She reports that she does not drink alcohol and does not use drugs.  Allergies:  Allergies  Allergen Reactions   Lisinopril Hives and Rash     Review of Systems Hypertension history:  BP Readings from Last 3 Encounters:  02/12/22 (!) 144/72  01/16/22 128/86  11/09/21 (!) 144/82   No history of leukopenia recently  Lab Results  Component Value Date   WBC 5.8 04/01/2022   HGB 13.0 04/01/2022   HCT 39.4 04/01/2022   MCV 82.4 04/01/2022   PLT 201.0 04/01/2022      Examination:   LMP 12/11/2013    General Appearance:  well-built and nourished, pleasant, not anxious or hyperkinetic.         Eyes: No abnormal prominence, no stare present.  No swelling of the eyelids   Neck: The thyroid is palpable laterally on the right side, relatively soft and indistinct, appears to be about a 3-4 cm nodule better felt on swallowing.  This is smooth Left lobe not palpable  There is no lymphadenopathy in the neck .            Extremities: hands are slightly warm. No ankle edema.  Neurological:  No fine tremors are present. Deep tendon reflexes at biceps are brisk.  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 All questions were answered satisfactorily    Elayne Snare 04/04/2022, 8:50 PM    Note: This office note was prepared with Dragon voice recognition system technology. Any transcriptional errors that result from this process are unintentional.

## 2022-04-05 ENCOUNTER — Ambulatory Visit: Payer: 59 | Admitting: Endocrinology

## 2022-04-05 ENCOUNTER — Other Ambulatory Visit: Payer: Self-pay | Admitting: Family

## 2022-04-05 ENCOUNTER — Encounter: Payer: Self-pay | Admitting: Family

## 2022-04-05 ENCOUNTER — Encounter: Payer: Self-pay | Admitting: Endocrinology

## 2022-04-05 VITALS — BP 128/76 | HR 78 | Ht 65.0 in | Wt 240.2 lb

## 2022-04-05 DIAGNOSIS — Z780 Asymptomatic menopausal state: Secondary | ICD-10-CM

## 2022-04-05 DIAGNOSIS — E041 Nontoxic single thyroid nodule: Secondary | ICD-10-CM

## 2022-04-05 DIAGNOSIS — E0590 Thyrotoxicosis, unspecified without thyrotoxic crisis or storm: Secondary | ICD-10-CM | POA: Diagnosis not present

## 2022-04-05 DIAGNOSIS — E559 Vitamin D deficiency, unspecified: Secondary | ICD-10-CM

## 2022-04-05 MED ORDER — CHOLECALCIFEROL 1.25 MG (50000 UT) PO TABS: ORAL_TABLET | ORAL | 0 refills | Status: DC

## 2022-04-06 LAB — T4, FREE: Free T4: 0.85 ng/dL (ref 0.82–1.77)

## 2022-04-08 ENCOUNTER — Other Ambulatory Visit: Payer: Self-pay | Admitting: Family

## 2022-04-08 DIAGNOSIS — F419 Anxiety disorder, unspecified: Secondary | ICD-10-CM

## 2022-04-10 ENCOUNTER — Other Ambulatory Visit: Payer: Self-pay

## 2022-04-10 ENCOUNTER — Other Ambulatory Visit: Payer: Self-pay | Admitting: Family

## 2022-04-10 DIAGNOSIS — C20 Malignant neoplasm of rectum: Secondary | ICD-10-CM

## 2022-04-10 DIAGNOSIS — E559 Vitamin D deficiency, unspecified: Secondary | ICD-10-CM

## 2022-04-11 ENCOUNTER — Other Ambulatory Visit: Payer: Self-pay | Admitting: Endocrinology

## 2022-04-11 DIAGNOSIS — E0590 Thyrotoxicosis, unspecified without thyrotoxic crisis or storm: Secondary | ICD-10-CM

## 2022-04-12 ENCOUNTER — Other Ambulatory Visit: Payer: Self-pay

## 2022-04-12 DIAGNOSIS — E0590 Thyrotoxicosis, unspecified without thyrotoxic crisis or storm: Secondary | ICD-10-CM

## 2022-04-13 LAB — THYROTROPIN RECEPTOR AUTOABS: Thyrotropin Receptor Ab: 17 IU/L — ABNORMAL HIGH (ref 0.00–1.75)

## 2022-04-16 ENCOUNTER — Ambulatory Visit: Payer: 59 | Admitting: Endocrinology

## 2022-04-18 NOTE — Telephone Encounter (Signed)
LVM to inform patient that appt is changed to a physical on 04/22/22 and she can do labs before appt?

## 2022-04-19 ENCOUNTER — Other Ambulatory Visit (INDEPENDENT_AMBULATORY_CARE_PROVIDER_SITE_OTHER): Payer: 59

## 2022-04-19 ENCOUNTER — Ambulatory Visit: Payer: 59 | Admitting: Family

## 2022-04-19 DIAGNOSIS — E559 Vitamin D deficiency, unspecified: Secondary | ICD-10-CM

## 2022-04-19 DIAGNOSIS — C20 Malignant neoplasm of rectum: Secondary | ICD-10-CM

## 2022-04-19 LAB — CBC WITH DIFFERENTIAL/PLATELET
Basophils Absolute: 0.1 10*3/uL (ref 0.0–0.1)
Basophils Relative: 1.1 % (ref 0.0–3.0)
Eosinophils Absolute: 0.3 10*3/uL (ref 0.0–0.7)
Eosinophils Relative: 5.1 % — ABNORMAL HIGH (ref 0.0–5.0)
HCT: 39.8 % (ref 36.0–46.0)
Hemoglobin: 13.2 g/dL (ref 12.0–15.0)
Lymphocytes Relative: 32.1 % (ref 12.0–46.0)
Lymphs Abs: 1.8 10*3/uL (ref 0.7–4.0)
MCHC: 33.2 g/dL (ref 30.0–36.0)
MCV: 83.2 fl (ref 78.0–100.0)
Monocytes Absolute: 0.4 10*3/uL (ref 0.1–1.0)
Monocytes Relative: 6.7 % (ref 3.0–12.0)
Neutro Abs: 3.1 10*3/uL (ref 1.4–7.7)
Neutrophils Relative %: 55 % (ref 43.0–77.0)
Platelets: 194 10*3/uL (ref 150.0–400.0)
RBC: 4.79 Mil/uL (ref 3.87–5.11)
RDW: 13.7 % (ref 11.5–15.5)
WBC: 5.6 10*3/uL (ref 4.0–10.5)

## 2022-04-19 LAB — VITAMIN D 25 HYDROXY (VIT D DEFICIENCY, FRACTURES): VITD: 17.6 ng/mL — ABNORMAL LOW (ref 30.00–100.00)

## 2022-04-20 LAB — CEA: CEA: <2 ng/mL

## 2022-04-22 ENCOUNTER — Encounter: Payer: Self-pay | Admitting: Family

## 2022-04-22 ENCOUNTER — Ambulatory Visit (INDEPENDENT_AMBULATORY_CARE_PROVIDER_SITE_OTHER): Payer: 59 | Admitting: Family

## 2022-04-22 VITALS — BP 130/78 | HR 60 | Temp 97.8°F | Ht 65.0 in | Wt 242.6 lb

## 2022-04-22 DIAGNOSIS — Z0000 Encounter for general adult medical examination without abnormal findings: Secondary | ICD-10-CM

## 2022-04-22 DIAGNOSIS — Z23 Encounter for immunization: Secondary | ICD-10-CM | POA: Diagnosis not present

## 2022-04-22 NOTE — Addendum Note (Signed)
Addended by: [place], Elka Satterfield on: 04/22/2022 02:54 PM   Modules accepted: Orders

## 2022-04-22 NOTE — Assessment & Plan Note (Addendum)
Deferred pelvic exam in the absence of complaints and patient had a hysterectomy with cervix removed.  Patient will schedule mammogram.  Colonoscopy is up-to-date.  Encouraged walking program.  Influenza and shingrex vaccine provided today

## 2022-04-22 NOTE — Patient Instructions (Signed)
Health Maintenance for Postmenopausal Women Menopause is a normal process in which your ability to get pregnant comes to an end. This process happens slowly over many months or years, usually between the ages of 48 and 55. Menopause is complete when you have missed your menstrual period for 12 months. It is important to talk with your health care provider about some of the most common conditions that affect women after menopause (postmenopausal women). These include heart disease, cancer, and bone loss (osteoporosis). Adopting a healthy lifestyle and getting preventive care can help to promote your health and wellness. The actions you take can also lower your chances of developing some of these common conditions. What are the signs and symptoms of menopause? During menopause, you may have the following symptoms: Hot flashes. These can be moderate or severe. Night sweats. Decrease in sex drive. Mood swings. Headaches. Tiredness (fatigue). Irritability. Memory problems. Problems falling asleep or staying asleep. Talk with your health care provider about treatment options for your symptoms. Do I need hormone replacement therapy? Hormone replacement therapy is effective in treating symptoms that are caused by menopause, such as hot flashes and night sweats. Hormone replacement carries certain risks, especially as you become older. If you are thinking about using estrogen or estrogen with progestin, discuss the benefits and risks with your health care provider. How can I reduce my risk for heart disease and stroke? The risk of heart disease, heart attack, and stroke increases as you age. One of the causes may be a change in the body's hormones during menopause. This can affect how your body uses dietary fats, triglycerides, and cholesterol. Heart attack and stroke are medical emergencies. There are many things that you can do to help prevent heart disease and stroke. Watch your blood pressure High  blood pressure causes heart disease and increases the risk of stroke. This is more likely to develop in people who have high blood pressure readings or are overweight. Have your blood pressure checked: Every 3-5 years if you are 18-39 years of age. Every year if you are 40 years old or older. Eat a healthy diet  Eat a diet that includes plenty of vegetables, fruits, low-fat dairy products, and lean protein. Do not eat a lot of foods that are high in solid fats, added sugars, or sodium. Get regular exercise Get regular exercise. This is one of the most important things you can do for your health. Most adults should: Try to exercise for at least 150 minutes each week. The exercise should increase your heart rate and make you sweat (moderate-intensity exercise). Try to do strengthening exercises at least twice each week. Do these in addition to the moderate-intensity exercise. Spend less time sitting. Even light physical activity can be beneficial. Other tips Work with your health care provider to achieve or maintain a healthy weight. Do not use any products that contain nicotine or tobacco. These products include cigarettes, chewing tobacco, and vaping devices, such as e-cigarettes. If you need help quitting, ask your health care provider. Know your numbers. Ask your health care provider to check your cholesterol and your blood sugar (glucose). Continue to have your blood tested as directed by your health care provider. Do I need screening for cancer? Depending on your health history and family history, you may need to have cancer screenings at different stages of your life. This may include screening for: Breast cancer. Cervical cancer. Lung cancer. Colorectal cancer. What is my risk for osteoporosis? After menopause, you may be   at increased risk for osteoporosis. Osteoporosis is a condition in which bone destruction happens more quickly than new bone creation. To help prevent osteoporosis or  the bone fractures that can happen because of osteoporosis, you may take the following actions: If you are 19-50 years old, get at least 1,000 mg of calcium and at least 600 international units (IU) of vitamin D per day. If you are older than age 50 but younger than age 70, get at least 1,200 mg of calcium and at least 600 international units (IU) of vitamin D per day. If you are older than age 70, get at least 1,200 mg of calcium and at least 800 international units (IU) of vitamin D per day. Smoking and drinking excessive alcohol increase the risk of osteoporosis. Eat foods that are rich in calcium and vitamin D, and do weight-bearing exercises several times each week as directed by your health care provider. How does menopause affect my mental health? Depression may occur at any age, but it is more common as you become older. Common symptoms of depression include: Feeling depressed. Changes in sleep patterns. Changes in appetite or eating patterns. Feeling an overall lack of motivation or enjoyment of activities that you previously enjoyed. Frequent crying spells. Talk with your health care provider if you think that you are experiencing any of these symptoms. General instructions See your health care provider for regular wellness exams and vaccines. This may include: Scheduling regular health, dental, and eye exams. Getting and maintaining your vaccines. These include: Influenza vaccine. Get this vaccine each year before the flu season begins. Pneumonia vaccine. Shingles vaccine. Tetanus, diphtheria, and pertussis (Tdap) booster vaccine. Your health care provider may also recommend other immunizations. Tell your health care provider if you have ever been abused or do not feel safe at home. Summary Menopause is a normal process in which your ability to get pregnant comes to an end. This condition causes hot flashes, night sweats, decreased interest in sex, mood swings, headaches, or lack  of sleep. Treatment for this condition may include hormone replacement therapy. Take actions to keep yourself healthy, including exercising regularly, eating a healthy diet, watching your weight, and checking your blood pressure and blood sugar levels. Get screened for cancer and depression. Make sure that you are up to date with all your vaccines. This information is not intended to replace advice given to you by your health care provider. Make sure you discuss any questions you have with your health care provider. Document Revised: 11/27/2020 Document Reviewed: 11/27/2020 Elsevier Patient Education  2023 Elsevier Inc.  

## 2022-04-22 NOTE — Progress Notes (Signed)
Subjective:    Patient ID: Tammie Robinson, female    DOB: 1965/09/10, 55 y.o.   MRN: [id number]  CC: Tammie Robinson is a 56 y.o. female who presents today for physical exam.    HPI: Feels well today.  No new complaints    Colorectal Cancer Screening: h/o rectal cancer.  UTD , dr Allen Laube 11/29/2019, repeat in 5 years  Breast Cancer Screening: Mammogram due Cervical Cancer Screening: No cervix status post abdominal hysterectomy Bone Health screening/DEXA for 65+: No increased fracture risk. Defer screening at this time.        Tetanus - utd        Pneumococcal - Candidate for due to asthma; she plans to schedule at another visit  Labs: Screening labs done prior Exercise: No regular exercise.   Alcohol use:  rare Smoking/tobacco use: Nonsmoker.     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 Jamal Collin   COLONOSCOPY WITH PROPOFOL N/A 01/26/2016   Procedure: COLONOSCOPY WITH PROPOFOL through colostomy;  Surgeon: Lucilla Lame, MD;  Location: [facility];  Service: Endoscopy;  Laterality: N/A;  port-a-cath   COLONOSCOPY WITH PROPOFOL N/A 11/29/2019   Procedure: COLONOSCOPY WITH PROPOFOL;  Surgeon: Lucilla Lame, MD;  Location: [place];  Service: Endoscopy;  Laterality: N/A;   POLYPECTOMY  01/26/2016   Procedure: POLYPECTOMY;  Surgeon: Lucilla Lame, MD;  Location: [place];  Service: Endoscopy;;   POLYPECTOMY N/A 11/29/2019   Procedure: POLYPECTOMY;  Surgeon: Lucilla Lame, MD;  Location: [place];  Service: Endoscopy;  Laterality: N/A;   PORTA CATH REMOVAL N/A 01/28/2018   Procedure: PORTA CATH REMOVAL;  Surgeon: Algernon Huxley, MD;  Location: [place] CV LAB;  Service: Cardiovascular;  Laterality: N/A;   PORTACATH PLACEMENT     PROCTECTOMY  04/12/14   [facility] - CA surgery - with colostomy   SALPINGOOPHORECTOMY Bilateral 04/12/14   [facility] - with CA surg   VAGINA RECONSTRUCTION SURGERY  04/12/14   [facility] - transabdonimal rectus abdominus muscle reconstruction   VAGINAL DELIVERY     2, preterm labor   Family History  Problem Relation Age of Onset   Thyroid disease Mother    Arthritis Mother    Stroke Mother    Hypertension Mother    Diabetes Mother    Arthritis Father    Heart disease Father 79       s/p CABG   Stroke Father    Hypertension Father    Alcohol abuse Maternal Grandmother    Diabetes Maternal Grandmother    Alcohol abuse Maternal Grandfather    Diabetes Maternal Grandfather    Breast cancer Paternal Grandmother    Cancer Paternal Grandmother        breast   Heart disease Paternal Grandfather    Breast cancer Paternal Aunt  70   Colon cancer Neg Hx       ALLERGIES: Lisinopril  Current Outpatient Medications on File Prior to Visit  Medication Sig Dispense Refill   albuterol (PROVENTIL HFA;VENTOLIN HFA) 108 (90 Base) MCG/ACT inhaler Inhale 1-2 puffs into the lungs every 4 (four) hours as needed for wheezing or shortness of breath. 1 Inhaler 3   albuterol (VENTOLIN HFA) 108 (90 Base) MCG/ACT inhaler Inhale 2 puffs into the lungs every 6 (six) hours as needed for wheezing or shortness of breath. 8 g 0   amLODipine (NORVASC) 10 MG tablet TAKE (1) TABLET DAILY. 90 tablet 1   azelastine (ASTELIN) 0.1 % nasal spray Place 1 spray into both nostrils 2 (two) times daily. Use in each nostril as directed 30 mL 12   budesonide-formoterol (SYMBICORT) 160-4.5 MCG/ACT inhaler Inhale 2 puffs into the lungs 2 (two) times daily. 1 each 3    Cholecalciferol 1.25 MG (50000 UT) TABS 50,000 units PO qwk for 8 weeks. 8 tablet 0   escitalopram (LEXAPRO) 10 MG tablet TAKE 1 TABLET DAILY. 90 tablet 3   fluticasone (FLONASE) 50 MCG/ACT nasal spray Place 2 sprays into both nostrils daily. 16 g 6   gabapentin (NEURONTIN) 300 MG capsule TAKE TWO CAPSULES BY MOUTH EVERY MORNING, ONE CAPSULE midday AND TWO CAPSULES EVERY EVENING 150 capsule 5   losartan (COZAAR) 25 MG tablet Take 1.5 tablets (37.5 mg total) by mouth daily. Take 1.5 tablet daily. 180 tablet 2   methimazole (TAPAZOLE) 10 MG tablet Take 2 tablets (20 mg total) by mouth 2 (two) times daily. 360 tablet 1   triamcinolone cream (KENALOG) 0.1 % Apply 1 application topically 4 (four) times daily as needed. 80 g 1   zolpidem (AMBIEN) 10 MG tablet Take 1 tablet (10 mg total) by mouth at bedtime as needed. for sleep 30 tablet 2   Current Facility-Administered Medications on File Prior to Visit  Medication Dose Route Frequency Provider Last Rate Last Admin   heparin lock flush 100 unit/mL  500 Units Intravenous Once Leia Alf, MD       sodium chloride 0.9 % injection 10 mL  10 mL Intravenous PRN Leia Alf, MD       sodium chloride 0.9 % injection 10 mL  10 mL Intravenous PRN Leia Alf, MD   10 mL at 11/28/14 1120   sodium chloride flush (NS) 0.9 % injection 10 mL  10 mL Intravenous PRN Evlyn Kanner, NP   10 mL at 01/03/16 1413   sodium chloride flush (NS) 0.9 % injection 10 mL  10 mL Intravenous PRN Lloyd Huger, MD   10 mL at 11/15/16 1111   sodium chloride flush (NS) 0.9 % injection 10 mL  10 mL Intravenous PRN Cammie Sickle, MD   10 mL at 11/24/17 1434    Social History   Tobacco Use   Smoking status: Never   Smokeless tobacco: Never  Vaping Use   Vaping Use: Never used  Substance Use Topics   Alcohol use: No    Comment: rare - holidays   Drug use: No    Review of Systems  Constitutional:  Negative for chills, fever and unexpected  weight change.  HENT:  Negative for congestion.   Respiratory:  Negative for cough.   Cardiovascular:  Negative for chest pain, palpitations and leg swelling.  Gastrointestinal:  Negative for nausea and vomiting.  Musculoskeletal:  Negative for arthralgias and myalgias.  Skin:  Negative for rash.  Neurological:  Negative for headaches.  Hematological:  Negative for adenopathy.  Psychiatric/Behavioral:  Negative for confusion.       Objective:    BP 130/78   Pulse 60   Temp 97.8 F (36.6 C) (Oral)   Ht '5\' 5"'$  (1.651 m)   Wt 242 lb 9.6 oz (110 kg)   LMP 12/11/2013   SpO2 97%   BMI 40.37 kg/m   BP Readings from Last 3 Encounters:  04/22/22 130/78  04/05/22 128/76  02/12/22 (!) 144/72   Wt Readings from Last 3 Encounters:  04/22/22 242 lb 9.6 oz (110 kg)  04/05/22 240 lb 3.2 oz (109 kg)  02/12/22 244 lb 6.4 oz (110.9 kg)    Physical Exam Vitals reviewed.  Constitutional:      Appearance: She is well-developed.  Eyes:     Conjunctiva/sclera: Conjunctivae normal.  Neck:     Thyroid: No thyroid mass or thyromegaly.  Cardiovascular:     Rate and Rhythm: Normal rate and regular rhythm.     Pulses: Normal pulses.     Heart sounds: Normal heart sounds.  Pulmonary:     Effort: Pulmonary effort is normal.     Breath sounds: Normal breath sounds. No wheezing, rhonchi or rales.  Chest:  Breasts:    Breasts are symmetrical.     Right: No inverted nipple, mass, nipple discharge, skin change or tenderness.     Left: No inverted nipple, mass, nipple discharge, skin change or tenderness.  Lymphadenopathy:     Head:     Right side of head: No submental, submandibular, tonsillar, preauricular, posterior auricular or occipital adenopathy.     Left side of head: No submental, submandibular, tonsillar, preauricular, posterior auricular or occipital adenopathy.     Cervical: No cervical adenopathy.     Right cervical: No superficial, deep or posterior cervical adenopathy.    Left  cervical: No superficial, deep or posterior cervical adenopathy.  Skin:    General: Skin is warm and dry.  Neurological:     Mental Status: She is alert.  Psychiatric:        Speech: Speech normal.        Behavior: Behavior normal.        Thought Content: Thought content normal.        Assessment & Plan:   Problem List Items Addressed This Visit       Other   Routine physical examination    Deferred pelvic exam in the absence of complaints and patient had a hysterectomy with cervix removed.  Patient will schedule mammogram.  Colonoscopy is up-to-date.  Encouraged walking program.  Influenza and shingrex vaccine provided today        I am having Tammie Robinson maintain her albuterol, losartan, azelastine, triamcinolone cream, albuterol, fluticasone, budesonide-formoterol, gabapentin, methimazole, amLODipine, zolpidem, Cholecalciferol, and escitalopram.   No orders of the defined types were placed in this encounter.   Return precautions given.   Risks, benefits, and alternatives of the medications and treatment plan prescribed today were discussed, and patient expressed understanding.   Education regarding symptom management and diagnosis given to patient on AVS.   Continue to follow with Burnard Hawthorne, FNP for routine health maintenance.   Tammie Robinson and I agreed with plan.   [name], FNP

## 2022-05-02 LAB — THYROTROPIN RECEPTOR AUTOABS

## 2022-05-02 LAB — SPECIMEN STATUS REPORT

## 2022-05-16 ENCOUNTER — Other Ambulatory Visit: Payer: Self-pay | Admitting: Family

## 2022-05-16 ENCOUNTER — Other Ambulatory Visit (INDEPENDENT_AMBULATORY_CARE_PROVIDER_SITE_OTHER): Payer: 59

## 2022-05-16 DIAGNOSIS — E0590 Thyrotoxicosis, unspecified without thyrotoxic crisis or storm: Secondary | ICD-10-CM | POA: Diagnosis not present

## 2022-05-16 DIAGNOSIS — C20 Malignant neoplasm of rectum: Secondary | ICD-10-CM

## 2022-05-16 LAB — T3, FREE: T3, Free: 2.6 pg/mL (ref 2.3–4.2)

## 2022-05-16 LAB — T4, FREE: Free T4: 0.39 ng/dL — ABNORMAL LOW (ref 0.60–1.60)

## 2022-05-16 LAB — TSH: TSH: 50.63 u[IU]/mL — ABNORMAL HIGH (ref 0.35–5.50)

## 2022-05-21 ENCOUNTER — Encounter: Payer: Self-pay | Admitting: Endocrinology

## 2022-05-21 ENCOUNTER — Ambulatory Visit: Payer: 59 | Admitting: Endocrinology

## 2022-05-21 VITALS — BP 122/70 | HR 58 | Ht 65.0 in | Wt 246.6 lb

## 2022-05-21 DIAGNOSIS — E0590 Thyrotoxicosis, unspecified without thyrotoxic crisis or storm: Secondary | ICD-10-CM | POA: Diagnosis not present

## 2022-05-21 MED ORDER — METHIMAZOLE 5 MG PO TABS: 7.5000 mg | ORAL_TABLET | Freq: Every day | ORAL | 1 refills | Status: DC

## 2022-05-21 NOTE — Patient Instructions (Signed)
Stop Methimazole for 7 week then 7.'5mg'$  daily

## 2022-05-21 NOTE — Progress Notes (Signed)
Patient ID: Tammie Robinson, female   DOB: October 19, 1965, 56 y.o.   MRN: [id number]                                                                                                               Reason for Appointment:  Hyperthyroidism    Chief complaint: Feeling warm   History of Present Illness:   At the time of diagnosis she was having some symptoms of feeling warm, sweating and shakiness.  May have also started losing weight.  Did not have any significant palpitations Her PCP currently was doing screening lab work and had abnormal thyroid levels Initial abnormal free T4 was 3.1 as of 04/18/2021, previously TSH was undetectable on 03/27/2021 and prior to that normal   She was started on methimazole for her hyperthyroidism in 08/2021 initially with 80 mg a day With this her symptoms improved and methimazole was reduced subsequently  More recent history: In June she started feeling cold and having some joint pains, may have been a little more tired and had a weight gain of 12 pounds  Since her thyroid levels showed hypothyroidism on 6/28 her methimazole was stopped, previous dose was 20 mg twice daily However since then she started becoming hyperthyroid again with symptoms of palpitations   She is now continuing on methimazole 10 mg twice a day No change was made in September Free T4 was normal at 0.85 at that time but her thyrotropin receptor antibody was significantly high  She tends to have fatigue but she does not think this is any worse More cold intolerance recently Has gained 6 pounds Thyroid levels now show marked hypothyroidism with TSH 51  Wt Readings from Last 3 Encounters:  05/21/22 246 lb 9.6 oz (111.9 kg)  04/22/22 242 lb 9.6 oz (110 kg)  04/05/22 240 lb 3.2 oz (109 kg)    Thyroid function tests as follows:     Lab Results  Component Value Date   FREET4 0.39 (L) 05/16/2022   FREET4 0.85 04/04/2022   FREET4 1.43 02/06/2022   T3FREE 2.6 05/16/2022   T3FREE 6.4 (H)  04/18/2021   TSH 50.63 (H) 05/16/2022   TSH 0.17 (L) 04/04/2022   TSH 0.06 (L) 04/01/2022    Lab Results  Component Value Date   THYROTRECAB 17.00 (H) 04/12/2022   THYROTRECAB CANCELED 04/04/2022    THYROID NODULE  This was initially diagnosed in 10/22 and subsequently benign on biopsy as seen on the Afirma testing  Ultrasound exam of 05/02/2021 showed the following  1. Borderline thyromegaly with findings suggestive of multinodular goiter. 2. Nodule #1 measuring 3.8 cm meets imaging criteria to recommend percutaneous sampling as indicated  Thyroid scan of 03/04/2022 showed the following  24 hour I-123 uptake = 81.1% (normal 10-30%) Large cold RIGHT thyroid nodule, benign by FNA with Afirma.   Homogeneous increased tracer uptake in the thyroid lobes bilaterally with markedly elevated 4 hour and 24 hour radio iodine uptakes.  Allergies as of 05/21/2022       Reactions  Lisinopril Hives, Rash        Medication List        Accurate as of May 21, 2022 11:54 AM. If you have any questions, ask your nurse or doctor.          albuterol 108 (90 Base) MCG/ACT inhaler Commonly known as: VENTOLIN HFA Inhale 1-2 puffs into the lungs every 4 (four) hours as needed for wheezing or shortness of breath.   albuterol 108 (90 Base) MCG/ACT inhaler Commonly known as: VENTOLIN HFA Inhale 2 puffs into the lungs every 6 (six) hours as needed for wheezing or shortness of breath.   amLODipine 10 MG tablet Commonly known as: NORVASC TAKE (1) TABLET DAILY.   azelastine 0.1 % nasal spray Commonly known as: ASTELIN Place 1 spray into both nostrils 2 (two) times daily. Use in each nostril as directed   budesonide-formoterol 160-4.5 MCG/ACT inhaler Commonly known as: SYMBICORT Inhale 2 puffs into the lungs 2 (two) times daily.   Cholecalciferol 1.25 MG (50000 UT) Tabs 50,000 units PO qwk for 8 weeks.   escitalopram 10 MG tablet Commonly known as: LEXAPRO TAKE 1 TABLET  DAILY.   fluticasone 50 MCG/ACT nasal spray Commonly known as: FLONASE Place 2 sprays into both nostrils daily.   gabapentin 300 MG capsule Commonly known as: NEURONTIN TAKE TWO CAPSULES BY MOUTH EVERY MORNING, ONE CAPSULE midday AND TWO CAPSULES EVERY EVENING   losartan 25 MG tablet Commonly known as: COZAAR Take 1.5 tablets (37.5 mg total) by mouth daily. Take 1.5 tablet daily.   methimazole 10 MG tablet Commonly known as: TAPAZOLE Take 2 tablets (20 mg total) by mouth 2 (two) times daily.   triamcinolone cream 0.1 % Commonly known as: KENALOG Apply 1 application topically 4 (four) times daily as needed.   zolpidem 10 MG tablet Commonly known as: AMBIEN Take 1 tablet (10 mg total) by mouth at bedtime as needed. 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 Jamal Collin   COLONOSCOPY WITH PROPOFOL N/A 01/26/2016   Procedure: COLONOSCOPY WITH PROPOFOL through colostomy;  Surgeon: Lucilla Lame, MD;  Location: [place];  Service: Endoscopy;  Laterality: N/A;  port-a-cath   COLONOSCOPY WITH PROPOFOL N/A 11/29/2019   Procedure: COLONOSCOPY WITH PROPOFOL;  Surgeon: Lucilla Lame, MD;  Location: [place];  Service: Endoscopy;  Laterality: N/A;   POLYPECTOMY  01/26/2016   Procedure: POLYPECTOMY;  Surgeon: Lucilla Lame, MD;  Location: [place];  Service: Endoscopy;;   POLYPECTOMY N/A 11/29/2019   Procedure: POLYPECTOMY;  Surgeon: Lucilla Lame, MD;  Location:  [place];  Service: Endoscopy;  Laterality: N/A;   PORTA CATH REMOVAL N/A 01/28/2018   Procedure: PORTA CATH REMOVAL;  Surgeon: Algernon Huxley, MD;  Location: [place] CV LAB;  Service:  Cardiovascular;  Laterality: N/A;   PORTACATH PLACEMENT     PROCTECTOMY  04/12/14   [facility] - CA surgery - with colostomy   SALPINGOOPHORECTOMY Bilateral 04/12/14   [facility] - with CA surg   VAGINA RECONSTRUCTION SURGERY  04/12/14   [facility] - transabdonimal rectus abdominus muscle reconstruction   VAGINAL DELIVERY     2, preterm labor    Family History  Problem Relation Age of Onset   Thyroid disease Mother    Arthritis Mother    Stroke Mother    Hypertension Mother    Diabetes Mother    Arthritis Father    Heart disease Father 36       s/p CABG   Stroke Father    Hypertension Father    Alcohol abuse Maternal Grandmother    Diabetes Maternal Grandmother    Alcohol abuse Maternal Grandfather    Diabetes Maternal Grandfather    Breast cancer Paternal Grandmother    Cancer Paternal Grandmother        breast   Heart disease Paternal Grandfather    Breast cancer Paternal Aunt 64   Colon cancer Neg Hx     Social History:  reports that she has never smoked. She has never used smokeless tobacco. She reports that she does not drink alcohol and does not use drugs.  Allergies:  Allergies  Allergen Reactions   Lisinopril Hives and Rash     Review of Systems  Has history of hypertension  BP Readings from Last 3 Encounters:  05/21/22 122/70  04/22/22 130/78  04/05/22 128/76   No history of leukopenia   Lab Results  Component Value Date   WBC 5.6 04/19/2022   HGB 13.2 04/19/2022   HCT 39.8 04/19/2022   MCV 83.2 04/19/2022   PLT 194.0 04/19/2022      Examination:   BP 122/70   Pulse (!) 58   Ht '5\' 5"'$  (1.651 m)   Wt 246 lb 9.6 oz (111.9 kg)   LMP 12/11/2013   SpO2 99%   BMI 41.04 kg/m   Mild hoarseness present  THYROID exam: Right lobe is enlarged about 2-2.5 times  normal and slightly firm Left lobe not palpable          Reflexes are normal at biceps   Assessment/Plan:   Hyperthyroidism, secondary to Graves' disease  With maintaining her on 10 mg methimazole now she is abruptly becoming hypothyroid without significant symptoms or signs This is despite high thyrotropin receptor antibody last month  Goiter may be slightly smaller  She will stop methimazole for 1 week and then start at 7.5 mg daily, follow-up in December If she continues to require lower doses of methimazole may not need I-131 treatment and this was discussed  Elayne Snare 05/21/2022, 11:54 AM    Note: This office note was prepared with Dragon voice recognition system technology. Any transcriptional errors that result from this process are unintentional.

## 2022-06-12 ENCOUNTER — Other Ambulatory Visit: Payer: Self-pay | Admitting: Family

## 2022-06-12 DIAGNOSIS — I10 Essential (primary) hypertension: Secondary | ICD-10-CM

## 2022-06-14 ENCOUNTER — Other Ambulatory Visit: Payer: Self-pay | Admitting: Family

## 2022-06-14 DIAGNOSIS — C20 Malignant neoplasm of rectum: Secondary | ICD-10-CM

## 2022-06-24 ENCOUNTER — Encounter: Payer: Self-pay | Admitting: Family

## 2022-06-25 ENCOUNTER — Other Ambulatory Visit: Payer: Self-pay | Admitting: Family

## 2022-06-25 DIAGNOSIS — G4700 Insomnia, unspecified: Secondary | ICD-10-CM

## 2022-06-25 MED ORDER — ZOLPIDEM TARTRATE 10 MG PO TABS: 10.0000 mg | ORAL_TABLET | Freq: Every evening | ORAL | 2 refills | Status: DC | PRN

## 2022-06-25 NOTE — Progress Notes (Signed)
I looked up patient on [place] Controlled Substances Reporting System PMP AWARE and saw no activity that raised concern of inappropriate use.   

## 2022-06-26 ENCOUNTER — Other Ambulatory Visit (INDEPENDENT_AMBULATORY_CARE_PROVIDER_SITE_OTHER): Payer: 59

## 2022-06-26 DIAGNOSIS — E0590 Thyrotoxicosis, unspecified without thyrotoxic crisis or storm: Secondary | ICD-10-CM | POA: Diagnosis not present

## 2022-06-26 LAB — T3, FREE: T3, Free: 3 pg/mL (ref 2.3–4.2)

## 2022-06-26 LAB — TSH: TSH: 3.57 u[IU]/mL (ref 0.35–5.50)

## 2022-06-26 LAB — T4, FREE: Free T4: 0.61 ng/dL (ref 0.60–1.60)

## 2022-06-27 LAB — THYROTROPIN RECEPTOR AUTOABS: Thyrotropin Receptor Ab: 13.2 IU/L — ABNORMAL HIGH (ref 0.00–1.75)

## 2022-07-01 ENCOUNTER — Other Ambulatory Visit: Payer: Self-pay | Admitting: Family

## 2022-07-03 NOTE — Progress Notes (Unsigned)
Patient ID: Tammie Robinson, female   DOB: 1966-03-03, 56 y.o.   MRN: [id number]                                                                                                               Reason for Appointment:  Hyperthyroidism    Chief complaint: Follow-up   History of Present Illness:   At the time of diagnosis she was having some symptoms of feeling warm, sweating and shakiness.  May have also started losing weight.  Did not have any significant palpitations Her PCP currently was doing screening lab work and had abnormal thyroid levels Initial abnormal free T4 was 3.1 as of 04/18/2021, previously TSH was undetectable on 03/27/2021 and prior to that normal   She was started on methimazole for her hyperthyroidism in 08/2021 initially with 80 mg a day With this her symptoms improved and methimazole was reduced subsequently  More recent history:  She has had marked variability in her thyroid levels and methimazole requirements in 2023 She was markedly hypothyroid in 10/23 and symptomatic also along with some weight gain and TSH of 51  She was told to hold off her methimazole for 1 week and then start with 7.5 mg daily She does not feel as tired and overall feels good Her weight is about the same  She still has a lot of questions about I-131 treatment and this was discussed in detail again.  She is also asking about this compared to surgical thyroidectomy  Wt Readings from Last 3 Encounters:  07/04/22 248 lb 3.2 oz (112.6 kg)  05/21/22 246 lb 9.6 oz (111.9 kg)  04/22/22 242 lb 9.6 oz (110 kg)    Thyroid function tests as follows:     Lab Results  Component Value Date   FREET4 0.61 06/26/2022   FREET4 0.39 (L) 05/16/2022   FREET4 0.85 04/04/2022   T3FREE 3.0 06/26/2022   T3FREE 2.6 05/16/2022   T3FREE 6.4 (H) 04/18/2021   TSH 3.57 06/26/2022   TSH 50.63 (H) 05/16/2022   TSH 0.17 (L) 04/04/2022    Lab Results  Component Value Date   THYROTRECAB 13.20 (H) 06/26/2022    THYROTRECAB 17.00 (H) 04/12/2022   THYROTRECAB CANCELED 04/04/2022    THYROID NODULE  This was initially diagnosed in 10/22 and subsequently benign on biopsy as seen on the Afirma testing  Ultrasound exam of 05/02/2021 showed the following  1. Borderline thyromegaly with findings suggestive of multinodular goiter. 2. Nodule #1 measuring 3.8 cm meets imaging criteria to recommend percutaneous sampling as indicated  Thyroid scan of 03/04/2022 showed the following  24 hour I-123 uptake = 81.1% (normal 10-30%) Large cold RIGHT thyroid nodule, benign by FNA with Afirma.   Homogeneous increased tracer uptake in the thyroid lobes bilaterally with markedly elevated 4 hour and 24 hour radio iodine uptakes.  Allergies as of 07/04/2022       Reactions   Lisinopril Hives, Rash        Medication List  Accurate as of July 04, 2022  2:29 PM. If you have any questions, ask your nurse or doctor.          albuterol 108 (90 Base) MCG/ACT inhaler Commonly known as: VENTOLIN HFA Inhale 1-2 puffs into the lungs every 4 (four) hours as needed for wheezing or shortness of breath.   albuterol 108 (90 Base) MCG/ACT inhaler Commonly known as: VENTOLIN HFA Inhale 2 puffs into the lungs every 6 (six) hours as needed for wheezing or shortness of breath.   amLODipine 10 MG tablet Commonly known as: NORVASC TAKE (1) TABLET DAILY.   azelastine 0.1 % nasal spray Commonly known as: ASTELIN Place 1 spray into both nostrils 2 (two) times daily. Use in each nostril as directed   budesonide-formoterol 160-4.5 MCG/ACT inhaler Commonly known as: SYMBICORT Inhale 2 puffs into the lungs 2 (two) times daily.   Cholecalciferol 1.25 MG (50000 UT) Tabs 50,000 units PO qwk for 8 weeks.   escitalopram 10 MG tablet Commonly known as: LEXAPRO TAKE 1 TABLET DAILY.   fluticasone 50 MCG/ACT nasal spray Commonly known as: FLONASE Place 2 sprays into both nostrils daily.   gabapentin 300 MG  capsule Commonly known as: NEURONTIN TAKE TWO CAPSULES BY MOUTH EVERY MORNING, ONE CAPSULE midday AND TWO CAPSULES EVERY EVENING   losartan 25 MG tablet Commonly known as: COZAAR Take 1.5 tablets (37.5 mg total) by mouth daily. Take 1.5 tablet daily.   methimazole 5 MG tablet Commonly known as: TAPAZOLE Take 1.5 tablets (7.5 mg total) by mouth daily.   triamcinolone cream 0.1 % Commonly known as: KENALOG Apply 1 application topically 4 (four) times daily as needed.   zolpidem 10 MG tablet Commonly known as: AMBIEN Take 1 tablet (10 mg total) by mouth at bedtime as needed. 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 Jamal Collin   COLONOSCOPY WITH PROPOFOL N/A 01/26/2016   Procedure: COLONOSCOPY WITH PROPOFOL through colostomy;  Surgeon: Lucilla Lame, MD;  Location: [place];  Service: Endoscopy;  Laterality: N/A;  port-a-cath   COLONOSCOPY WITH PROPOFOL N/A 11/29/2019   Procedure: COLONOSCOPY WITH PROPOFOL;  Surgeon: Lucilla Lame, MD;  Location: [place];  Service: Endoscopy;  Laterality: N/A;   POLYPECTOMY  01/26/2016   Procedure: POLYPECTOMY;  Surgeon: Lucilla Lame, MD;  Location: [place];  Service: Endoscopy;;   POLYPECTOMY N/A 11/29/2019   Procedure: POLYPECTOMY;  Surgeon: Lucilla Lame, MD;  Location: [place];  Service: Endoscopy;  Laterality: N/A;   PORTA CATH REMOVAL N/A 01/28/2018   Procedure: PORTA CATH REMOVAL;  Surgeon: Algernon Huxley, MD;  Location: [place] [facility];  Service: Cardiovascular;  Laterality: N/A;   PORTACATH PLACEMENT     PROCTECTOMY  04/12/14   [facility] - CA surgery -  with colostomy   SALPINGOOPHORECTOMY Bilateral 04/12/14   [facility] - with CA surg   VAGINA RECONSTRUCTION SURGERY  04/12/14   [facility] - transabdonimal rectus abdominus muscle reconstruction   VAGINAL DELIVERY     2, preterm labor    Family History  Problem Relation Age of Onset   Thyroid disease Mother    Arthritis Mother    Stroke Mother    Hypertension Mother    Diabetes Mother    Arthritis Father    Heart disease Father 68       s/p CABG   Stroke Father    Hypertension Father    Alcohol abuse Maternal Grandmother    Diabetes Maternal Grandmother    Alcohol abuse Maternal Grandfather    Diabetes Maternal Grandfather    Breast cancer Paternal Grandmother    Cancer Paternal Grandmother        breast   Heart disease Paternal Grandfather    Breast cancer Paternal Aunt 98   Colon cancer Neg Hx     Social History:  reports that she has never smoked. She has never used smokeless tobacco. She reports that she does not drink alcohol and does not use drugs.  Allergies:  Allergies  Allergen Reactions   Lisinopril Hives and Rash     Review of Systems  Has history of hypertension  BP Readings from Last 3 Encounters:  05/21/22 122/70  04/22/22 130/78  04/05/22 128/76   No history of leukopenia   Lab Results  Component Value Date   WBC 5.6 04/19/2022   HGB 13.2 04/19/2022   HCT 39.8 04/19/2022   MCV 83.2 04/19/2022   PLT 194.0 04/19/2022      Examination:   Ht '5\' 5"'$  (1.651 m)   Wt 248 lb 3.2 oz (112.6 kg)   LMP 12/11/2013   BMI 41.30 kg/m     THYROID exam: Right lobe is enlarged about 2 times normal and slightly firm Left lobe not palpable          Reflexes are normal at biceps   Assessment/Plan:   Hyperthyroidism, secondary to Graves' disease  She has had hyperthyroidism since 03/2021  With 7.5  methimazole now she is subjectively doing well and her thyroid levels are normal  Goiter is slightly smaller  Since her thyrotropin receptor antibody is still significantly higher she agrees to do I-131 treatment Discussed in detail how this is going to be done and efficacy, side effects and reassured her about the safety She will do this at the end of the month and reminded her to hold her methimazole for 5 to 6 days and restart for 3 weeks after 3 days of the treatment  Elayne Snare 07/04/2022, 2:29 PM    Note: This office note was prepared with Dragon voice recognition system technology. Any transcriptional errors that result from this process are unintentional.

## 2022-07-04 ENCOUNTER — Encounter: Payer: Self-pay | Admitting: Endocrinology

## 2022-07-04 ENCOUNTER — Ambulatory Visit: Payer: 59 | Admitting: Endocrinology

## 2022-07-04 VITALS — BP 156/80 | Ht 65.0 in | Wt 248.2 lb

## 2022-07-04 DIAGNOSIS — E0590 Thyrotoxicosis, unspecified without thyrotoxic crisis or storm: Secondary | ICD-10-CM

## 2022-07-05 NOTE — Written Directive (Addendum)
MOLECULAR IMAGING AND THERAPEUTICS WRITTEN DIRECTIVE   PATIENT NAME: Tammie Robinson  PT DOB:   Dec 28, 1965                                              MRN: [id number]  ---------------------------------------------------------------------------------------------------------------------   I-131 WHOLE THYROID THERAPY (NON-CANCER)    RADIOPHARMACEUTICAL:   Iodine-131 Capsule    PRESCRIBED DOSE FOR ADMINISTRATION: 12 mCi  (dose recommendation by Dr. Dwyane Dee, Agree w/ dose Leonia Reeves)   ROUTE OFADMINISTRATION: PO   DIAGNOSIS:  Hyperthyroidism, Throtoxicosis w/o crisis, Graves Disease   REFERRING PHYSICIAN: Dr Elayne Snare   TSH:    Lab Results  Component Value Date   TSH 3.57 06/26/2022   TSH 50.63 (H) 05/16/2022   TSH 0.17 (L) 04/04/2022     PRIOR I-131 THERAPY (Date and Dose):   PRIOR RADIOLOGY EXAMS (Results and Date): NM THYROID MULT UPTAKE W/IMAGING  Result Date: 03/05/2022 CLINICAL DATA:  Hyperthyroidism, TSH 0.03 EXAM: THYROID SCAN AND UPTAKE - 4 AND 24 HOURS TECHNIQUE: Following oral administration of I-123 capsule, anterior planar imaging was acquired at 24 hours. Thyroid uptake was calculated with a thyroid probe at 4-6 hours and 24 hours. RADIOPHARMACEUTICALS:  304.5 uCi I-123 sodium iodide p.o. COMPARISON:  Thyroid ultrasound 05/02/2021 FINDINGS: Large cold nodule RIGHT lobe, underwent recent FNA, by Afirma [place] benign. Homogeneous tracer uptake in remaining thyroid tissue bilaterally. 4 hour I-123 uptake = 70.7% (normal 5-20%) 24 hour I-123 uptake = 81.1% (normal 10-30%) IMPRESSION: Large cold RIGHT thyroid nodule, benign by FNA with Afirma. Homogeneous increased tracer uptake in the thyroid lobes bilaterally with markedly elevated 4 hour and 24 hour radio iodine uptakes. Findings consistent with Graves disease. Electronically Signed   By: Lavonia Dana M.D.   On: 03/05/2022 14:24   US THYROID  Result Date: 05/02/2021 CLINICAL DATA:  Other. Low TSH. Elevated T3 and  T4. Evaluate for thyroid nodules. EXAM: THYROID ULTRASOUND TECHNIQUE: Ultrasound examination of the thyroid gland and adjacent soft tissues was performed. COMPARISON:  None. FINDINGS: Parenchymal Echotexture: Mildly heterogenous Isthmus: Normal in size measures 0.5 cm in diameter Right lobe: Borderline enlarged measuring 5.1 x 3.2 x 2.6 cm Left lobe: Normal in size measuring 4.5 x 2.1 x 1.6 cm _________________________________________________________ Estimated total number of nodules >/= 1 cm: 2 Number of spongiform nodules >/=  2 cm not described below (TR1): 0 Number of mixed cystic and solid nodules >/= 1.5 cm not described below (TR2): 0 _________________________________________________________ Nodule # 1: Location: Right; Inferior Maximum size: 3.8 cm; Other 2 dimensions: 3.5 x 3.5 cm Composition: solid/almost completely solid (2) Echogenicity: hyperechoic (1) Shape: taller-than-wide (3) Margins: smooth (0) Echogenic foci: none (0) ACR TI-RADS total points: 6. ACR TI-RADS risk category: TR4 (4-6 points). ACR TI-RADS recommendations: **Given size (>/= 1.5 cm) and appearance, fine needle aspiration of this moderately suspicious nodule should be considered based on TI-RADS criteria. _________________________________________________________ There is an approximately 0.8 cm ill-defined hypoechoic nodule/pseudonodule within the superior pole of the left lobe of the thyroid (labeled 2), which does not meet criteria to recommend percutaneous sampling or continued dedicated follow-up. Questioned 0.6 cm ill-defined hypoechoic nodule within the inferior pole of the left lobe of the thyroid (labeled 3), is favored to represent a pseudonodule as it lacks defined borders on both provided transverse and sagittal images. _________________________________________________________ Note is made of a mildly prominent though non pathologically  enlarged left cervical lymph node which measures 0.8 cm in greatest short axis diameter,  presumably reactive in etiology. IMPRESSION: 1. Borderline thyromegaly with findings suggestive of multinodular goiter. 2. Nodule #1 meets imaging criteria to recommend percutaneous sampling as indicated. 3. None of the additional discretely measured nodules/pseudo nodules meet imaging criteria to recommend percutaneous sampling or continued dedicated follow-up. The above is in keeping with the ACR TI-RADS recommendations - Perlie Stene Am Coll Radiol 2017;14:587-595. Electronically Signed   By: Sandi Mariscal M.D.   On: 05/02/2021 14:43      ADDITIONAL PHYSICIAN COMMENTS/NOTES :  Dr Dwyane Dee commented on his order  61mi for Graves disease.  Patien is taking 7.5 Methimazole.  Dr KDwyane Deeinstructed pt to stop taking 5 to 6 days before I131 Therapy .     AUTHORIZED USER SIGNATURE & TIME STAMP: JRennis Golden MD   07/05/22    3:49 PM

## 2022-07-10 ENCOUNTER — Other Ambulatory Visit: Payer: Self-pay | Admitting: Family

## 2022-07-10 DIAGNOSIS — I10 Essential (primary) hypertension: Secondary | ICD-10-CM

## 2022-07-11 ENCOUNTER — Other Ambulatory Visit: Payer: Self-pay | Admitting: Family

## 2022-07-15 ENCOUNTER — Other Ambulatory Visit: Payer: Self-pay | Admitting: Endocrinology

## 2022-07-15 ENCOUNTER — Other Ambulatory Visit: Payer: Self-pay | Admitting: Family

## 2022-07-15 DIAGNOSIS — I10 Essential (primary) hypertension: Secondary | ICD-10-CM

## 2022-07-16 ENCOUNTER — Other Ambulatory Visit: Payer: Self-pay | Admitting: Family

## 2022-07-17 ENCOUNTER — Other Ambulatory Visit: Payer: Self-pay | Admitting: Family

## 2022-07-17 ENCOUNTER — Other Ambulatory Visit: Payer: Self-pay | Admitting: *Deleted

## 2022-07-17 ENCOUNTER — Telehealth: Payer: Self-pay | Admitting: Endocrinology

## 2022-07-17 DIAGNOSIS — E559 Vitamin D deficiency, unspecified: Secondary | ICD-10-CM

## 2022-07-17 NOTE — Telephone Encounter (Signed)
Patient is calling to say that she needs the authorization code for her RAI therapy procedure tomorrow 07/18/2022.  Patient states that the hospital has told her that it will be canceled if she does not get the code.

## 2022-07-17 NOTE — Telephone Encounter (Signed)
Called patient no answer. Looks like authorization is pending review from [employer]. Voicemail left

## 2022-07-17 NOTE — Telephone Encounter (Signed)
Pt has an OV on 07/24/22, can check at visit. Future order placed

## 2022-07-18 ENCOUNTER — Ambulatory Visit
Admission: RE | Admit: 2022-07-18 | Discharge: 2022-07-18 | Disposition: A | Discharge status: Home / Self Care | Payer: 59 | Source: Ambulatory Visit | Attending: Endocrinology | Admitting: Endocrinology

## 2022-07-18 DIAGNOSIS — E0590 Thyrotoxicosis, unspecified without thyrotoxic crisis or storm: Secondary | ICD-10-CM | POA: Diagnosis not present

## 2022-07-18 MED ORDER — SODIUM IODIDE I 131 CAPSULE
12.5300 | Freq: Once | INTRAVENOUS | Status: AC | PRN
  Administered 2022-07-18: 12.53 via ORAL

## 2022-07-23 ENCOUNTER — Other Ambulatory Visit: Payer: Self-pay | Admitting: Endocrinology

## 2022-07-23 ENCOUNTER — Encounter: Payer: Self-pay | Admitting: Endocrinology

## 2022-07-23 ENCOUNTER — Telehealth: Payer: Self-pay | Admitting: Family

## 2022-07-23 MED ORDER — METOPROLOL SUCCINATE ER 25 MG PO TB24: 25.0000 mg | ORAL_TABLET | Freq: Every day | ORAL | 3 refills | Status: DC

## 2022-07-23 NOTE — Telephone Encounter (Signed)
Pt called stating her endocrinology want to see if the provider can do a EKG on her. Pt would like to be called

## 2022-07-23 NOTE — Telephone Encounter (Signed)
Spoke to pt regarding her message. Made appt for 07/26/22 for EKG

## 2022-07-24 ENCOUNTER — Ambulatory Visit: Payer: 59 | Admitting: Family

## 2022-07-26 ENCOUNTER — Encounter: Payer: 59 | Admitting: Family

## 2022-07-26 ENCOUNTER — Encounter: Payer: Self-pay | Admitting: Family

## 2022-07-26 NOTE — Progress Notes (Unsigned)
Assessment & Plan:  There are no diagnoses linked to this encounter.   Return precautions given.   Risks, benefits, and alternatives of the medications and treatment plan prescribed today were discussed, and patient expressed understanding.   Education regarding symptom management and diagnosis given to patient on AVS either electronically or printed.  No follow-ups on file.  [name], FNP  Subjective:    Patient ID: Tammie Robinson, female    DOB: 05-Jul-1966, 57 y.o.   MRN: [id number]  CC: Tammie Robinson is a 57 y.o. female who presents today for follow up.   HPI: HPI  Allergies: Lisinopril Current Outpatient Medications on File Prior to Visit  Medication Sig Dispense Refill   metoprolol succinate (TOPROL-XL) 25 MG 24 hr tablet Take 1 tablet (25 mg total) by mouth daily. 30 tablet 3   albuterol (PROVENTIL HFA;VENTOLIN HFA) 108 (90 Base) MCG/ACT inhaler Inhale 1-2 puffs into the lungs every 4 (four) hours as needed for wheezing or shortness of breath. 1 Inhaler 3   albuterol (VENTOLIN HFA) 108 (90 Base) MCG/ACT inhaler Inhale 2 puffs into the lungs every 6 (six) hours as needed for wheezing or shortness of breath. 8 g 0   amLODipine (NORVASC) 10 MG tablet TAKE (1) TABLET DAILY. 90 tablet 2   azelastine (ASTELIN) 0.1 % nasal spray Place 1 spray into both nostrils 2 (two) times daily. Use in each nostril as directed 30 mL 12   budesonide-formoterol (SYMBICORT) 160-4.5 MCG/ACT inhaler Inhale 2 puffs into the lungs 2 (two) times daily. 1 each 3   Cholecalciferol 1.25 MG (50000 UT) TABS 50,000 units PO qwk for 8 weeks. 8 tablet 0   escitalopram (LEXAPRO) 10 MG tablet TAKE 1 TABLET DAILY. 90 tablet 3   fluticasone (FLONASE) 50 MCG/ACT nasal spray Place 2 sprays into both nostrils daily. 16 g 6   gabapentin (NEURONTIN) 300 MG capsule TAKE TWO CAPSULES BY MOUTH EVERY MORNING, ONE CAPSULE midday AND TWO CAPSULES EVERY EVENING 150 capsule 5   losartan (COZAAR) 25 MG tablet TAKE ONE  AND ONE-HALF TABLETS DAILY. 45 tablet 0   methimazole (TAPAZOLE) 5 MG tablet Take 1.5 tablets (7.5 mg total) by mouth daily. 45 tablet 0   triamcinolone cream (KENALOG) 0.1 % Apply 1 application topically 4 (four) times daily as needed. 80 g 1   zolpidem (AMBIEN) 10 MG tablet Take 1 tablet (10 mg total) by mouth at bedtime as needed. for sleep 30 tablet 2   Current Facility-Administered Medications on File Prior to Visit  Medication Dose Route Frequency Provider Last Rate Last Admin   heparin lock flush 100 unit/mL  500 Units Intravenous Once Leia Alf, MD       sodium chloride 0.9 % injection 10 mL  10 mL Intravenous PRN Leia Alf, MD       sodium chloride 0.9 % injection 10 mL  10 mL Intravenous PRN Leia Alf, MD   10 mL at 11/28/14 1120   sodium chloride flush (NS) 0.9 % injection 10 mL  10 mL Intravenous PRN Evlyn Kanner, NP   10 mL at 01/03/16 1413   sodium chloride flush (NS) 0.9 % injection 10 mL  10 mL Intravenous PRN Lloyd Huger, MD   10 mL at 11/15/16 1111   sodium chloride flush (NS) 0.9 % injection 10 mL  10 mL Intravenous PRN Cammie Sickle, MD   10 mL at 11/24/17 1434    Review of Systems    Objective:  LMP 12/11/2013  BP Readings from Last 3 Encounters:  07/04/22 (!) 156/80  05/21/22 122/70  04/22/22 130/78   Wt Readings from Last 3 Encounters:  07/04/22 248 lb 3.2 oz (112.6 kg)  05/21/22 246 lb 9.6 oz (111.9 kg)  04/22/22 242 lb 9.6 oz (110 kg)    Physical Exam    This encounter was created in error - please disregard.

## 2022-07-29 DIAGNOSIS — I499 Cardiac arrhythmia, unspecified: Secondary | ICD-10-CM | POA: Diagnosis not present

## 2022-07-29 DIAGNOSIS — I10 Essential (primary) hypertension: Secondary | ICD-10-CM | POA: Diagnosis not present

## 2022-07-29 DIAGNOSIS — E0590 Thyrotoxicosis, unspecified without thyrotoxic crisis or storm: Secondary | ICD-10-CM | POA: Diagnosis not present

## 2022-08-06 DIAGNOSIS — I499 Cardiac arrhythmia, unspecified: Secondary | ICD-10-CM | POA: Diagnosis not present

## 2022-08-09 ENCOUNTER — Ambulatory Visit: Payer: 59 | Admitting: Family

## 2022-08-09 ENCOUNTER — Encounter: Payer: Self-pay | Admitting: Family

## 2022-08-09 VITALS — BP 124/80 | HR 50 | Temp 97.9°F | Ht 65.0 in | Wt 241.4 lb

## 2022-08-09 DIAGNOSIS — Z23 Encounter for immunization: Secondary | ICD-10-CM | POA: Diagnosis not present

## 2022-08-09 DIAGNOSIS — G4700 Insomnia, unspecified: Secondary | ICD-10-CM

## 2022-08-09 DIAGNOSIS — I10 Essential (primary) hypertension: Secondary | ICD-10-CM | POA: Diagnosis not present

## 2022-08-09 MED ORDER — METHIMAZOLE 5 MG PO TABS: 10.0000 mg | ORAL_TABLET | Freq: Every day | ORAL | 0 refills | Status: DC

## 2022-08-09 NOTE — Assessment & Plan Note (Signed)
Chronic, stable.  Continue Ambien 10 mg qpm.

## 2022-08-09 NOTE — Progress Notes (Signed)
Assessment & Plan:  Primary hypertension Assessment & Plan: Chronic, stable.  Heart rate on low end of normal today.  Long discussion with patient and fortunately she is asymptomatic.  Palpitations have resolved on metoprolol succinate 25 mg daily.  Advised if she were to develop dizziness, to reduce metoprolol succinate dose by half or discontinue altogether and to notify me.  Continue amlodipine 10 mg qd, losartan 25 mg qd   Insomnia, unspecified type Assessment & Plan: Chronic, stable.  Continue Ambien 10 mg qpm.    Other orders -     methIMAzole; Take 2 tablets (10 mg total) by mouth daily.  Dispense: 45 tablet; Refill: 0     Return precautions given.   Risks, benefits, and alternatives of the medications and treatment plan prescribed today were discussed, and patient expressed understanding.   Education regarding symptom management and diagnosis given to patient on AVS either electronically or printed.  Return in about 3 months (around 11/08/2022).  [name], FNP  Subjective:    Patient ID: Tammie Robinson, female    DOB: 04/02/1966, 57 y.o.   MRN: [id number]  CC: Tammie Robinson is a 57 y.o. female who presents today for follow up.   HPI: Palpitations have resolved. She feels better since starting metoprol.  No dizziness, syncope, CP.    HTN- compliant with amlodipine '10mg'$ , losartan '25mg'$  qd, metoprolol succinate '25mg'$  qd.  Follow up cardiology 07/29/22.  Follow-up palpitations.  Holter if palpitations recur.  Pending echocardiogram Follow-up endocrinology 07/04/2022.  She is compliant with methimazole 10 mg, increased 07/23/22.   Insomnia-doing well on Ambien 10 mg  Due for Shingrix 2 of 2 today Allergies: Lisinopril Current Outpatient Medications on File Prior to Visit  Medication Sig Dispense Refill   albuterol (PROVENTIL HFA;VENTOLIN HFA) 108 (90 Base) MCG/ACT inhaler Inhale 1-2 puffs into the lungs every 4 (four) hours as needed for wheezing or shortness of  breath. 1 Inhaler 3   albuterol (VENTOLIN HFA) 108 (90 Base) MCG/ACT inhaler Inhale 2 puffs into the lungs every 6 (six) hours as needed for wheezing or shortness of breath. 8 g 0   amLODipine (NORVASC) 10 MG tablet TAKE (1) TABLET DAILY. 90 tablet 2   azelastine (ASTELIN) 0.1 % nasal spray Place 1 spray into both nostrils 2 (two) times daily. Use in each nostril as directed 30 mL 12   budesonide-formoterol (SYMBICORT) 160-4.5 MCG/ACT inhaler Inhale 2 puffs into the lungs 2 (two) times daily. 1 each 3   Cholecalciferol 1.25 MG (50000 UT) TABS 50,000 units PO qwk for 8 weeks. 8 tablet 0   escitalopram (LEXAPRO) 10 MG tablet TAKE 1 TABLET DAILY. 90 tablet 3   fluticasone (FLONASE) 50 MCG/ACT nasal spray Place 2 sprays into both nostrils daily. 16 g 6   gabapentin (NEURONTIN) 300 MG capsule TAKE TWO CAPSULES BY MOUTH EVERY MORNING, ONE CAPSULE midday AND TWO CAPSULES EVERY EVENING 150 capsule 5   losartan (COZAAR) 25 MG tablet TAKE ONE AND ONE-HALF TABLETS DAILY. 45 tablet 0   metoprolol succinate (TOPROL-XL) 25 MG 24 hr tablet Take 1 tablet (25 mg total) by mouth daily. 30 tablet 3   triamcinolone cream (KENALOG) 0.1 % Apply 1 application topically 4 (four) times daily as needed. 80 g 1   zolpidem (AMBIEN) 10 MG tablet Take 1 tablet (10 mg total) by mouth at bedtime as needed. for sleep 30 tablet 2   Current Facility-Administered Medications on File Prior to Visit  Medication Dose Route Frequency Provider Last  Rate Last Admin   heparin lock flush 100 unit/mL  500 Units Intravenous Once Leia Alf, MD       sodium chloride 0.9 % injection 10 mL  10 mL Intravenous PRN Leia Alf, MD       sodium chloride 0.9 % injection 10 mL  10 mL Intravenous PRN Leia Alf, MD   10 mL at 11/28/14 1120   sodium chloride flush (NS) 0.9 % injection 10 mL  10 mL Intravenous PRN Evlyn Kanner, NP   10 mL at 01/03/16 1413   sodium chloride flush (NS) 0.9 % injection 10 mL  10 mL Intravenous PRN  Lloyd Huger, MD   10 mL at 11/15/16 1111   sodium chloride flush (NS) 0.9 % injection 10 mL  10 mL Intravenous PRN Cammie Sickle, MD   10 mL at 11/24/17 1434    Review of Systems    Objective:    BP 124/80   Pulse (!) 50   Temp 97.9 F (36.6 C) (Oral)   Ht '5\' 5"'$  (1.651 m)   Wt 241 lb 6.4 oz (109.5 kg)   LMP 12/11/2013   SpO2 97%   BMI 40.17 kg/m  BP Readings from Last 3 Encounters:  08/09/22 124/80  07/04/22 (!) 156/80  05/21/22 122/70   Wt Readings from Last 3 Encounters:  08/09/22 241 lb 6.4 oz (109.5 kg)  07/04/22 248 lb 3.2 oz (112.6 kg)  05/21/22 246 lb 9.6 oz (111.9 kg)    Physical Exam

## 2022-08-09 NOTE — Assessment & Plan Note (Signed)
Chronic, stable.  Heart rate on low end of normal today.  Long discussion with patient and fortunately she is asymptomatic.  Palpitations have resolved on metoprolol succinate 25 mg daily.  Advised if she were to develop dizziness, to reduce metoprolol succinate dose by half or discontinue altogether and to notify me.  Continue amlodipine 10 mg qd, losartan 25 mg qd

## 2022-08-09 NOTE — Addendum Note (Signed)
Addended by: [place], [place] on: 08/09/2022 09:14 AM   Modules accepted: Orders

## 2022-08-12 ENCOUNTER — Other Ambulatory Visit: Payer: Self-pay | Admitting: Family

## 2022-08-12 DIAGNOSIS — I10 Essential (primary) hypertension: Secondary | ICD-10-CM

## 2022-08-13 DIAGNOSIS — I361 Nonrheumatic tricuspid (valve) insufficiency: Secondary | ICD-10-CM | POA: Diagnosis not present

## 2022-08-13 DIAGNOSIS — E0590 Thyrotoxicosis, unspecified without thyrotoxic crisis or storm: Secondary | ICD-10-CM | POA: Diagnosis not present

## 2022-08-13 DIAGNOSIS — I10 Essential (primary) hypertension: Secondary | ICD-10-CM | POA: Diagnosis not present

## 2022-08-16 ENCOUNTER — Other Ambulatory Visit (INDEPENDENT_AMBULATORY_CARE_PROVIDER_SITE_OTHER): Payer: 59

## 2022-08-16 DIAGNOSIS — E0590 Thyrotoxicosis, unspecified without thyrotoxic crisis or storm: Secondary | ICD-10-CM

## 2022-08-16 LAB — T4, FREE: Free T4: 1.1 ng/dL (ref 0.60–1.60)

## 2022-08-16 LAB — TSH: TSH: 0.07 u[IU]/mL — ABNORMAL LOW (ref 0.35–5.50)

## 2022-08-20 ENCOUNTER — Other Ambulatory Visit: Payer: 59

## 2022-08-23 ENCOUNTER — Encounter: Payer: Self-pay | Admitting: Endocrinology

## 2022-08-23 ENCOUNTER — Ambulatory Visit: Payer: 59 | Admitting: Endocrinology

## 2022-08-23 VITALS — BP 118/74 | HR 51 | Ht 65.0 in | Wt 244.4 lb

## 2022-08-23 DIAGNOSIS — E0590 Thyrotoxicosis, unspecified without thyrotoxic crisis or storm: Secondary | ICD-10-CM | POA: Diagnosis not present

## 2022-08-23 NOTE — Progress Notes (Signed)
Patient ID: Tammie Robinson, female   DOB: 01-01-1966, 57 y.o.   MRN: [id number]                                                                                                               Reason for Appointment:  Hyperthyroidism    Chief complaint: Follow-up   History of Present Illness:   At the time of diagnosis she was having some symptoms of feeling warm, sweating and shakiness.  May have also started losing weight.  Did not have any significant palpitations Her PCP currently was doing screening lab work and had abnormal thyroid levels Initial abnormal free T4 was 3.1 as of 04/18/2021, previously TSH was undetectable on 03/27/2021 and prior to that normal   She was started on methimazole for her hyperthyroidism in 08/2021 initially with 80 mg a day With this her symptoms improved and methimazole was reduced subsequently  More recent history:  She had marked variability in her thyroid levels and methimazole requirements in 2023  Since she has had persistent hyperthyroidism and most recent thyrotropin receptor antibody persistently high at about 12 she was referred for I-131 treatment She had this on 07/18/2022 and received 12.55 mCi  Subsequently she reported some palpitations and was started back on methimazole 10 mg daily, no atrial fibrillation was documented with her PCP and cardiologist Palpitations are better and also she has no unusual fatigue Weight is slightly higher  Wt Readings from Last 3 Encounters:  08/23/22 244 lb 6.4 oz (110.9 kg)  08/09/22 241 lb 6.4 oz (109.5 kg)  07/04/22 248 lb 3.2 oz (112.6 kg)    Thyroid levels show relatively better free T4 level and low TSH  Lab Results  Component Value Date   FREET4 1.10 08/16/2022   FREET4 0.61 06/26/2022   FREET4 0.39 (L) 05/16/2022   T3FREE 3.0 06/26/2022   T3FREE 2.6 05/16/2022   T3FREE 6.4 (H) 04/18/2021   TSH 0.07 (L) 08/16/2022   TSH 3.57 06/26/2022   TSH 50.63 (H) 05/16/2022    Lab Results   Component Value Date   THYROTRECAB 13.20 (H) 06/26/2022   THYROTRECAB 17.00 (H) 04/12/2022   THYROTRECAB CANCELED 04/04/2022    THYROID NODULE  This was initially diagnosed in 10/22 and subsequently benign on biopsy as seen on the Afirma testing  Ultrasound exam of 05/02/2021 showed the following  1. Borderline thyromegaly with findings suggestive of multinodular goiter. 2. Nodule #1 measuring 3.8 cm meets imaging criteria to recommend percutaneous sampling as indicated  Thyroid scan of 03/04/2022 showed the following  24 hour I-123 uptake = 81.1% (normal 10-30%) Large cold RIGHT thyroid nodule, benign by FNA with Afirma.   Homogeneous increased tracer uptake in the thyroid lobes bilaterally with markedly elevated 4 hour and 24 hour radio iodine uptakes.  Allergies as of 08/23/2022       Reactions   Lisinopril Hives, Rash        Medication List        Accurate as of August 23, 2022 11:38 AM.  If you have any questions, ask your nurse or doctor.          albuterol 108 (90 Base) MCG/ACT inhaler Commonly known as: VENTOLIN HFA Inhale 1-2 puffs into the lungs every 4 (four) hours as needed for wheezing or shortness of breath.   albuterol 108 (90 Base) MCG/ACT inhaler Commonly known as: VENTOLIN HFA Inhale 2 puffs into the lungs every 6 (six) hours as needed for wheezing or shortness of breath.   amLODipine 10 MG tablet Commonly known as: NORVASC TAKE (1) TABLET DAILY.   azelastine 0.1 % nasal spray Commonly known as: ASTELIN Place 1 spray into both nostrils 2 (two) times daily. Use in each nostril as directed   budesonide-formoterol 160-4.5 MCG/ACT inhaler Commonly known as: SYMBICORT Inhale 2 puffs into the lungs 2 (two) times daily.   Cholecalciferol 1.25 MG (50000 UT) Tabs 50,000 units PO qwk for 8 weeks.   escitalopram 10 MG tablet Commonly known as: LEXAPRO TAKE 1 TABLET DAILY.   fluticasone 50 MCG/ACT nasal spray Commonly known as: FLONASE Place 2  sprays into both nostrils daily.   gabapentin 300 MG capsule Commonly known as: NEURONTIN TAKE TWO CAPSULES BY MOUTH EVERY MORNING, ONE CAPSULE midday AND TWO CAPSULES EVERY EVENING   losartan 25 MG tablet Commonly known as: COZAAR TAKE ONE AND ONE-HALF TABLETS DAILY.   methimazole 5 MG tablet Commonly known as: TAPAZOLE Take 2 tablets (10 mg total) by mouth daily.   metoprolol succinate 25 MG 24 hr tablet Commonly known as: TOPROL-XL Take 1 tablet (25 mg total) by mouth daily.   triamcinolone cream 0.1 % Commonly known as: KENALOG Apply 1 application topically 4 (four) times daily as needed.   zolpidem 10 MG tablet Commonly known as: AMBIEN Take 1 tablet (10 mg total) by mouth at bedtime as needed. 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 Jamal Collin   COLONOSCOPY WITH PROPOFOL N/A 01/26/2016   Procedure: COLONOSCOPY WITH PROPOFOL through colostomy;  Surgeon: Lucilla Lame, MD;  Location: [place];  Service: Endoscopy;  Laterality: N/A;  port-a-cath   COLONOSCOPY WITH PROPOFOL N/A 11/29/2019   Procedure: COLONOSCOPY WITH PROPOFOL;  Surgeon: Lucilla Lame, MD;  Location: [place];  Service: Endoscopy;  Laterality: N/A;   POLYPECTOMY  01/26/2016   Procedure: POLYPECTOMY;  Surgeon: Lucilla Lame, MD;  Location: [place];  Service: Endoscopy;;   POLYPECTOMY N/A 11/29/2019   Procedure: POLYPECTOMY;  Surgeon: Lucilla Lame, MD;  Location: [place];  Service: Endoscopy;  Laterality: N/A;   PORTA CATH REMOVAL N/A 01/28/2018   Procedure: PORTA CATH REMOVAL;  Surgeon: Algernon Huxley, MD;  Location: [place] [facility];  Service: Cardiovascular;  Laterality: N/A;   PORTACATH PLACEMENT     PROCTECTOMY  04/12/14  [facility] - CA surgery - with colostomy   SALPINGOOPHORECTOMY Bilateral 04/12/14   [facility] - with CA surg   VAGINA RECONSTRUCTION SURGERY  04/12/14   [facility] - transabdonimal rectus abdominus muscle reconstruction   VAGINAL DELIVERY     2, preterm labor    Family History  Problem Relation Age of Onset   Thyroid disease Mother    Arthritis Mother    Stroke Mother    Hypertension Mother    Diabetes Mother    Arthritis Father    Heart disease Father 28       s/p CABG   Stroke Father    Hypertension Father    Alcohol abuse Maternal Grandmother    Diabetes Maternal Grandmother    Alcohol abuse Maternal Grandfather    Diabetes Maternal Grandfather    Breast cancer Paternal Grandmother    Cancer Paternal Grandmother        breast   Heart disease Paternal Grandfather    Breast cancer Paternal Aunt 53   Colon cancer Neg Hx     Social History:  reports that she has never smoked. She has never used smokeless tobacco. She reports that she does not drink alcohol and does not use drugs.  Allergies:  Allergies  Allergen Reactions   Lisinopril Hives and Rash     Review of Systems  Has history of hypertension  BP Readings from Last 3 Encounters:  08/23/22 118/74  08/09/22 124/80  07/04/22 (!) 156/80   No history of leukopenia   Lab Results  Component Value Date   WBC 5.6 04/19/2022   HGB 13.2 04/19/2022   HCT 39.8 04/19/2022   MCV 83.2 04/19/2022   PLT 194.0 04/19/2022      Examination:   BP 118/74   Pulse (!) 51   Ht '5\' 5"'$  (1.651 m)   Wt 244 lb 6.4 oz (110.9 kg)   LMP 12/11/2013   SpO2 95%   BMI 40.67 kg/m     THYROID exam: Right lobe is enlarged about 2 times normal and firm Left lobe just  palpable          Reflexes are normal at biceps   Assessment/Plan:   Hyperthyroidism, secondary to Graves' disease  She had hyperthyroidism since 03/2021 She had I-131 treatment on 07/18/2022  With 10 methimazole now she is not having any recurrence of hyperthyroid symptoms or palpitation Doing well and her free T4 is excellent although TSH suppressed again  She will now taper off her methimazole with taking half tablet for 2 weeks and then stop Metoprolol will also be reduced to 12.5 mg for 2 weeks and then stop  Elayne Snare 08/23/2022, 11:38 AM    Note: This office note was prepared with Estate agent. Any transcriptional errors that result from this process are unintentional.

## 2022-08-23 NOTE — Patient Instructions (Addendum)
$'5mg'Y$  for 2 weeks then stop  Metoprolol 1/2 daily x14 then off

## 2022-08-28 ENCOUNTER — Other Ambulatory Visit: Payer: Self-pay | Admitting: Endocrinology

## 2022-09-09 ENCOUNTER — Ambulatory Visit: Payer: 59 | Admitting: Family

## 2022-09-09 ENCOUNTER — Other Ambulatory Visit: Payer: Self-pay | Admitting: Family

## 2022-09-09 ENCOUNTER — Encounter: Payer: Self-pay | Admitting: Family

## 2022-09-09 VITALS — BP 124/78 | HR 99 | Temp 97.5°F | Ht 65.0 in | Wt 247.5 lb

## 2022-09-09 DIAGNOSIS — J40 Bronchitis, not specified as acute or chronic: Secondary | ICD-10-CM

## 2022-09-09 DIAGNOSIS — I10 Essential (primary) hypertension: Secondary | ICD-10-CM

## 2022-09-09 DIAGNOSIS — R051 Acute cough: Secondary | ICD-10-CM | POA: Diagnosis not present

## 2022-09-09 LAB — POC COVID19 BINAXNOW: SARS Coronavirus 2 Ag: NEGATIVE

## 2022-09-09 LAB — POCT INFLUENZA A/B
Influenza A, POC: NEGATIVE
Influenza B, POC: NEGATIVE

## 2022-09-09 MED ORDER — PREDNISONE 10 MG PO TABS: ORAL_TABLET | ORAL | 0 refills | Status: DC

## 2022-09-09 MED ORDER — GUAIFENESIN-CODEINE 100-10 MG/5ML PO SYRP: 5.0000 mL | ORAL_SOLUTION | Freq: Every evening | ORAL | 0 refills | Status: DC | PRN

## 2022-09-09 NOTE — Progress Notes (Signed)
Assessment & Plan:  Acute cough -     POC COVID-19 BinaxNow -     POCT Influenza A/B -     predniSONE; Take 4 tablets ( total 40 mg) by mouth for 2 days; take 3 tablets ( total 30 mg) by mouth for 2 days; take 2 tablets ( total 20 mg) by mouth for 1 day; take 1 tablet ( total 10 mg) by mouth for 1 day.  Dispense: 17 tablet; Refill: 0 -     guaiFENesin-Codeine; Take 5 mLs by mouth at bedtime as needed for cough.  Dispense: 70 mL; Refill: 0  Bronchitis Assessment & Plan: Afebrile and in no acute respiratory stress.  Coarse lung sounds with wheezing.  History of asthma.  Flu and COVID-negative today.  I suspect viral etiology creating asthma exacerbation.  Will start 6-day prednisone taper.  Counseled on how to take prednisone and side effects . patient declines chest x-ray as she prefers start prednisone first.  She will let me know how she is doing.       Return precautions given.   Risks, benefits, and alternatives of the medications and treatment plan prescribed today were discussed, and patient expressed understanding.   Education regarding symptom management and diagnosis given to patient on AVS either electronically or printed.  No follow-ups on file.  [name], FNP  Subjective:    Patient ID: Tammie Robinson, female    DOB: [birth year], 57 y.o.   MRN: [id number]  CC: Tammie Robinson is a 57 y.o. female who presents today for an acute visit.    HPI: Complains of sore throat , nasal congestion , HA x 4 days, worsening. Cough is worse at night.  Endorses occassional wheeze which resolves with albuterol. Using albuterol 2x per day.  She is OTC cloridin, symbicort, flonase.   No fever, chills,bodyaches, ear pain, sore throat.      She has taken prednisone numerous times in the past and done well on medication.   History of asthma, sleep apnea, hypertension, rectal cancer, thyroid nodule.  Never smoker Allergies: Lisinopril Current Outpatient Medications on File  Prior to Visit  Medication Sig Dispense Refill   albuterol (PROVENTIL HFA;VENTOLIN HFA) 108 (90 Base) MCG/ACT inhaler Inhale 1-2 puffs into the lungs every 4 (four) hours as needed for wheezing or shortness of breath. 1 Inhaler 3   albuterol (VENTOLIN HFA) 108 (90 Base) MCG/ACT inhaler Inhale 2 puffs into the lungs every 6 (six) hours as needed for wheezing or shortness of breath. 8 g 0   amLODipine (NORVASC) 10 MG tablet TAKE (1) TABLET DAILY. 90 tablet 2   azelastine (ASTELIN) 0.1 % nasal spray Place 1 spray into both nostrils 2 (two) times daily. Use in each nostril as directed 30 mL 12   budesonide-formoterol (SYMBICORT) 160-4.5 MCG/ACT inhaler Inhale 2 puffs into the lungs 2 (two) times daily. 1 each 3   escitalopram (LEXAPRO) 10 MG tablet TAKE 1 TABLET DAILY. 90 tablet 3   fluticasone (FLONASE) 50 MCG/ACT nasal spray Place 2 sprays into both nostrils daily. 16 g 6   gabapentin (NEURONTIN) 300 MG capsule TAKE TWO CAPSULES BY MOUTH EVERY MORNING, ONE CAPSULE midday AND TWO CAPSULES EVERY EVENING 150 capsule 5   triamcinolone cream (KENALOG) 0.1 % Apply 1 application topically 4 (four) times daily as needed. 80 g 1   zolpidem (AMBIEN) 10 MG tablet Take 1 tablet (10 mg total) by mouth at bedtime as needed. for sleep 30 tablet 2  Cholecalciferol 1.25 MG (50000 UT) TABS 50,000 units PO qwk for 8 weeks. 8 tablet 0   methimazole (TAPAZOLE) 5 MG tablet Take 2 tablets (10 mg total) by mouth daily. 45 tablet 0   metoprolol succinate (TOPROL-XL) 25 MG 24 hr tablet Take 1 tablet (25 mg total) by mouth daily. 30 tablet 3   Current Facility-Administered Medications on File Prior to Visit  Medication Dose Route Frequency Provider Last Rate Last Admin   heparin lock flush 100 unit/mL  500 Units Intravenous Once Leia Alf, MD       sodium chloride 0.9 % injection 10 mL  10 mL Intravenous PRN Leia Alf, MD       sodium chloride 0.9 % injection 10 mL  10 mL Intravenous PRN Leia Alf, MD   10  mL at 11/28/14 1120   sodium chloride flush (NS) 0.9 % injection 10 mL  10 mL Intravenous PRN Evlyn Kanner, NP   10 mL at 01/03/16 1413   sodium chloride flush (NS) 0.9 % injection 10 mL  10 mL Intravenous PRN Lloyd Huger, MD   10 mL at 11/15/16 1111   sodium chloride flush (NS) 0.9 % injection 10 mL  10 mL Intravenous PRN Cammie Sickle, MD   10 mL at 11/24/17 1434    Review of Systems  Constitutional:  Negative for chills and fever.  HENT:  Positive for congestion. Negative for ear pain and sore throat.   Respiratory:  Positive for cough and wheezing.   Cardiovascular:  Negative for chest pain and palpitations.  Gastrointestinal:  Negative for nausea and vomiting.      Objective:    BP 124/78   Pulse 99   Temp (!) 97.5 F (36.4 C) (Oral)   Ht 5' 5"$  (1.651 m)   Wt 247 lb 8 oz (112.3 kg)   LMP 12/11/2013   SpO2 96%   BMI 41.19 kg/m   BP Readings from Last 3 Encounters:  09/09/22 124/78  08/23/22 118/74  08/09/22 124/80   Wt Readings from Last 3 Encounters:  09/09/22 247 lb 8 oz (112.3 kg)  08/23/22 244 lb 6.4 oz (110.9 kg)  08/09/22 241 lb 6.4 oz (109.5 kg)    Physical Exam Vitals reviewed.  Constitutional:      Appearance: She is well-developed.  HENT:     Head: Normocephalic and atraumatic.     Right Ear: Hearing, tympanic membrane, ear canal and external ear normal. No decreased hearing noted. No drainage, swelling or tenderness. No middle ear effusion. No foreign body. Tympanic membrane is not erythematous or bulging.     Left Ear: Hearing, tympanic membrane, ear canal and external ear normal. No decreased hearing noted. No drainage, swelling or tenderness.  No middle ear effusion. No foreign body. Tympanic membrane is not erythematous or bulging.     Nose: Nose normal. No rhinorrhea.     Right Sinus: No maxillary sinus tenderness or frontal sinus tenderness.     Left Sinus: No maxillary sinus tenderness or frontal sinus tenderness.      Mouth/Throat:     Pharynx: Uvula midline. No oropharyngeal exudate or posterior oropharyngeal erythema.     Tonsils: No tonsillar abscesses.  Eyes:     Conjunctiva/sclera: Conjunctivae normal.  Cardiovascular:     Rate and Rhythm: Regular rhythm.     Pulses: Normal pulses.     Heart sounds: Normal heart sounds.  Pulmonary:     Effort: Pulmonary effort is normal.  Breath sounds: Examination of the right-middle field reveals wheezing. Examination of the left-middle field reveals wheezing. Wheezing present. No rhonchi or rales.     Comments: Coarse lung sounds bilateral lung fields. Few expiratory wheezes Lymphadenopathy:     Head:     Right side of head: No submental, submandibular, tonsillar, preauricular, posterior auricular or occipital adenopathy.     Left side of head: No submental, submandibular, tonsillar, preauricular, posterior auricular or occipital adenopathy.     Cervical: No cervical adenopathy.  Skin:    General: Skin is warm and dry.  Neurological:     Mental Status: She is alert.  Psychiatric:        Speech: Speech normal.        Behavior: Behavior normal.        Thought Content: Thought content normal.

## 2022-09-09 NOTE — Patient Instructions (Signed)
Start prednisone taper, and please ensure you get in all doses prior to 1:00 pm every day as prednisone can interfere with sleep.  Please let me know if wheezing and cough does not improve

## 2022-09-09 NOTE — Assessment & Plan Note (Addendum)
Afebrile and in no acute respiratory stress.  Coarse lung sounds with wheezing.  History of asthma.  Flu and COVID-negative today.  I suspect viral etiology creating asthma exacerbation.  Will start 6-day prednisone taper.  Counseled on how to take prednisone and side effects . patient declines chest x-ray as she prefers start prednisone first.  She will let me know how she is doing.

## 2022-09-10 ENCOUNTER — Encounter: Payer: Self-pay | Admitting: Family

## 2022-09-10 ENCOUNTER — Other Ambulatory Visit: Payer: Self-pay | Admitting: Family

## 2022-09-10 DIAGNOSIS — J40 Bronchitis, not specified as acute or chronic: Secondary | ICD-10-CM

## 2022-09-10 MED ORDER — HYDROCOD POLI-CHLORPHE POLI ER 10-8 MG/5ML PO SUER: 5.0000 mL | Freq: Every evening | ORAL | 0 refills | Status: DC | PRN

## 2022-09-10 NOTE — Progress Notes (Signed)
Dc robitussin ac

## 2022-09-12 NOTE — Telephone Encounter (Signed)
Appt scheduled for 09/13/22

## 2022-09-12 NOTE — Telephone Encounter (Signed)
LVM to call back to schedule in office appt with Joycelyn Schmid

## 2022-09-13 ENCOUNTER — Ambulatory Visit
Admission: RE | Admit: 2022-09-13 | Discharge: 2022-09-13 | Disposition: A | Discharge status: Home / Self Care | Payer: 59 | Source: Ambulatory Visit | Attending: Family | Admitting: Family

## 2022-09-13 ENCOUNTER — Encounter: Payer: Self-pay | Admitting: Family

## 2022-09-13 ENCOUNTER — Ambulatory Visit: Payer: 59 | Admitting: Family

## 2022-09-13 VITALS — BP 146/88 | HR 95 | Temp 97.7°F | Ht 65.0 in | Wt 250.8 lb

## 2022-09-13 DIAGNOSIS — J40 Bronchitis, not specified as acute or chronic: Secondary | ICD-10-CM

## 2022-09-13 DIAGNOSIS — R059 Cough, unspecified: Secondary | ICD-10-CM | POA: Diagnosis not present

## 2022-09-13 MED ORDER — AMOXICILLIN-POT CLAVULANATE 875-125 MG PO TABS: 1.0000 | ORAL_TABLET | Freq: Two times a day (BID) | ORAL | 0 refills | Status: AC

## 2022-09-13 MED ORDER — HYDROCOD POLI-CHLORPHE POLI ER 10-8 MG/5ML PO SUER: 5.0000 mL | Freq: Every evening | ORAL | 0 refills | Status: DC | PRN

## 2022-09-13 NOTE — Assessment & Plan Note (Signed)
Afebrile.  No acute respiratory distress.  Concern for bacterial URI.  Pending chest x-ray, start Augmentin with probiotics.  I refilled Tussionex and advised caution with 10 mL dose.  Patient will complete prednisone and let me know how she doing

## 2022-09-13 NOTE — Patient Instructions (Signed)
Chest x-ray [facility] today.  I have prescribed Augmentin for you to start. Please complete prednisone.  Also refilled Tussionex cough syrup. Please take cough medication at night only as needed. As we discussed, I do not recommend dosing throughout the day as coughing is a protective mechanism . It also helps to break up thick mucous.  Do not take cough suppressants with alcohol as can lead to trouble breathing. Advise caution if taking cough suppressant and operating machinery ( i.e driving a car) as you may feel very tired.

## 2022-09-13 NOTE — Progress Notes (Signed)
Assessment & Plan:  Bronchitis Assessment & Plan: Afebrile.  No acute respiratory distress.  Concern for bacterial URI.  Pending chest x-ray, start Augmentin with probiotics.  I refilled Tussionex and advised caution with 10 mL dose.  Patient will complete prednisone and let me know how she doing  Orders: -     DG Chest 2 View; Future -     Amoxicillin-Pot Clavulanate; Take 1 tablet by mouth 2 (two) times daily for 7 days.  Dispense: 14 tablet; Refill: 0 -     Hydrocod Poli-Chlorphe Poli ER; Take 5-10 mLs by mouth at bedtime as needed for cough. Maximum daily dose 2m/day  Dispense: 115 mL; Refill: 0     Return precautions given.   Risks, benefits, and alternatives of the medications and treatment plan prescribed today were discussed, and patient expressed understanding.   Education regarding symptom management and diagnosis given to patient on AVS either electronically or printed.  No follow-ups on file.  Tammie Paris FNP  Subjective:    Patient ID: Tammie Robinson female    DOB: [birth year] 57y.o.   MRN: [id number] CC: Tammie RADONis a 57y.o. female who presents today for an acute visit.    HPI: HPI Follow-up wet sounding cough x 8 days, unchanged.   Endorses nasal congestion  She is not worse, however 'is not better'.    Occasional wheezing or can feel 'a little short of breath' when walking dog which resolves quickly.   No associated cp.   Treated for asthma exacerbation with 6-day prednisone taper.  She continues to take prednisone taper.  She is compliant with Tussionex, Symbicort, and albuterol.   She request refill of Tussionex which she states 561mnot particularly helpful, 10 mL is more helpful for her  No fever,chills   Allergies: Lisinopril Current Outpatient Medications on File Prior to Visit  Medication Sig Dispense Refill   albuterol (PROVENTIL HFA;VENTOLIN HFA) 108 (90 Base) MCG/ACT inhaler Inhale 1-2 puffs into the lungs every 4 (four)  hours as needed for wheezing or shortness of breath. 1 Inhaler 3   albuterol (VENTOLIN HFA) 108 (90 Base) MCG/ACT inhaler Inhale 2 puffs into the lungs every 6 (six) hours as needed for wheezing or shortness of breath. 8 g 0   amLODipine (NORVASC) 10 MG tablet TAKE (1) TABLET DAILY. 90 tablet 2   azelastine (ASTELIN) 0.1 % nasal spray Place 1 spray into both nostrils 2 (two) times daily. Use in each nostril as directed 30 mL 12   budesonide-formoterol (SYMBICORT) 160-4.5 MCG/ACT inhaler Inhale 2 puffs into the lungs 2 (two) times daily. 1 each 3   escitalopram (LEXAPRO) 10 MG tablet TAKE 1 TABLET DAILY. 90 tablet 3   fluticasone (FLONASE) 50 MCG/ACT nasal spray Place 2 sprays into both nostrils daily. 16 g 6   gabapentin (NEURONTIN) 300 MG capsule TAKE TWO CAPSULES BY MOUTH EVERY MORNING, ONE CAPSULE midday AND TWO CAPSULES EVERY EVENING 150 capsule 5   losartan (COZAAR) 25 MG tablet TAKE ONE AND ONE-HALF TABLETS DAILY. 45 tablet 0   predniSONE (DELTASONE) 10 MG tablet Take 4 tablets ( total 40 mg) by mouth for 2 days; take 3 tablets ( total 30 mg) by mouth for 2 days; take 2 tablets ( total 20 mg) by mouth for 1 day; take 1 tablet ( total 10 mg) by mouth for 1 day. 17 tablet 0   triamcinolone cream (KENALOG) 0.1 % Apply 1 application topically 4 (four) times daily as  needed. 80 g 1   zolpidem (AMBIEN) 10 MG tablet Take 1 tablet (10 mg total) by mouth at bedtime as needed. for sleep 30 tablet 2   Cholecalciferol 1.25 MG (50000 UT) TABS 50,000 units PO qwk for 8 weeks. 8 tablet 0   methimazole (TAPAZOLE) 5 MG tablet Take 2 tablets (10 mg total) by mouth daily. 45 tablet 0   metoprolol succinate (TOPROL-XL) 25 MG 24 hr tablet Take 1 tablet (25 mg total) by mouth daily. 30 tablet 3   Current Facility-Administered Medications on File Prior to Visit  Medication Dose Route Frequency Provider Last Rate Last Admin   heparin lock flush 100 unit/mL  500 Units Intravenous Once Leia Alf, MD        sodium chloride 0.9 % injection 10 mL  10 mL Intravenous PRN Leia Alf, MD       sodium chloride 0.9 % injection 10 mL  10 mL Intravenous PRN Leia Alf, MD   10 mL at 11/28/14 1120   sodium chloride flush (NS) 0.9 % injection 10 mL  10 mL Intravenous PRN Evlyn Kanner, NP   10 mL at 01/03/16 1413   sodium chloride flush (NS) 0.9 % injection 10 mL  10 mL Intravenous PRN Lloyd Huger, MD   10 mL at 11/15/16 1111   sodium chloride flush (NS) 0.9 % injection 10 mL  10 mL Intravenous PRN Cammie Sickle, MD   10 mL at 11/24/17 1434    Review of Systems  Constitutional:  Negative for chills and fever.  HENT:  Positive for congestion.   Respiratory:  Positive for cough, shortness of breath and wheezing.   Cardiovascular:  Negative for chest pain and palpitations.  Gastrointestinal:  Negative for nausea and vomiting.      Objective:    BP (!) 146/88   Pulse 95   Temp 97.7 F (36.5 C) (Oral)   Ht '5\' 5"'$  (1.651 m)   Wt 250 lb 12.8 oz (113.8 kg)   LMP 12/11/2013   SpO2 98%   BMI 41.74 kg/m   BP Readings from Last 3 Encounters:  09/13/22 (!) 146/88  09/09/22 124/78  08/23/22 118/74   Wt Readings from Last 3 Encounters:  09/13/22 250 lb 12.8 oz (113.8 kg)  09/09/22 247 lb 8 oz (112.3 kg)  08/23/22 244 lb 6.4 oz (110.9 kg)    Physical Exam Vitals reviewed.  Constitutional:      Appearance: She is well-developed.  HENT:     Head: Normocephalic and atraumatic.     Right Ear: Hearing, tympanic membrane, ear canal and external ear normal. No decreased hearing noted. No drainage, swelling or tenderness. No middle ear effusion. No foreign body. Tympanic membrane is not erythematous or bulging.     Left Ear: Hearing, tympanic membrane, ear canal and external ear normal. No decreased hearing noted. No drainage, swelling or tenderness.  No middle ear effusion. No foreign body. Tympanic membrane is not erythematous or bulging.     Nose: Nose normal. No rhinorrhea.      Right Sinus: No maxillary sinus tenderness or frontal sinus tenderness.     Left Sinus: No maxillary sinus tenderness or frontal sinus tenderness.     Mouth/Throat:     Pharynx: Uvula midline. No oropharyngeal exudate or posterior oropharyngeal erythema.     Tonsils: No tonsillar abscesses.  Eyes:     Conjunctiva/sclera: Conjunctivae normal.  Cardiovascular:     Rate and Rhythm: Regular rhythm.  Pulses: Normal pulses.     Heart sounds: Normal heart sounds.  Pulmonary:     Effort: Pulmonary effort is normal.     Breath sounds: Normal breath sounds. No wheezing, rhonchi or rales.     Comments: Coarse lung sounds bilateral lung fields.  Occasional expiratory wheeze. Lymphadenopathy:     Head:     Right side of head: No submental, submandibular, tonsillar, preauricular, posterior auricular or occipital adenopathy.     Left side of head: No submental, submandibular, tonsillar, preauricular, posterior auricular or occipital adenopathy.     Cervical: No cervical adenopathy.  Skin:    General: Skin is warm and dry.  Neurological:     Mental Status: She is alert.  Psychiatric:        Speech: Speech normal.        Behavior: Behavior normal.        Thought Content: Thought content normal.

## 2022-09-27 ENCOUNTER — Other Ambulatory Visit: Payer: Self-pay | Admitting: Family

## 2022-09-27 DIAGNOSIS — J40 Bronchitis, not specified as acute or chronic: Secondary | ICD-10-CM

## 2022-09-27 MED ORDER — PREDNISONE 10 MG PO TABS: ORAL_TABLET | ORAL | 0 refills | Status: DC

## 2022-10-07 ENCOUNTER — Other Ambulatory Visit (INDEPENDENT_AMBULATORY_CARE_PROVIDER_SITE_OTHER): Payer: 59

## 2022-10-07 ENCOUNTER — Other Ambulatory Visit: Payer: Self-pay | Admitting: Family

## 2022-10-07 DIAGNOSIS — E0590 Thyrotoxicosis, unspecified without thyrotoxic crisis or storm: Secondary | ICD-10-CM

## 2022-10-07 DIAGNOSIS — I10 Essential (primary) hypertension: Secondary | ICD-10-CM

## 2022-10-07 LAB — TSH: TSH: 11.08 u[IU]/mL — ABNORMAL HIGH (ref 0.35–5.50)

## 2022-10-07 LAB — T3, FREE: T3, Free: 2.6 pg/mL (ref 2.3–4.2)

## 2022-10-07 LAB — T4, FREE: Free T4: 0.67 ng/dL (ref 0.60–1.60)

## 2022-10-09 ENCOUNTER — Encounter: Payer: Self-pay | Admitting: Endocrinology

## 2022-10-09 ENCOUNTER — Ambulatory Visit: Payer: 59 | Admitting: Endocrinology

## 2022-10-09 VITALS — BP 144/86 | HR 60 | Ht 65.0 in | Wt 255.4 lb

## 2022-10-09 DIAGNOSIS — E890 Postprocedural hypothyroidism: Secondary | ICD-10-CM

## 2022-10-09 MED ORDER — LEVOTHYROXINE SODIUM 50 MCG PO TABS: 50.0000 ug | ORAL_TABLET | Freq: Every day | ORAL | 3 refills | Status: DC

## 2022-10-09 NOTE — Progress Notes (Signed)
Patient ID: Tammie Robinson, female   DOB: 03/11/1966, 57 y.o.   MRN: [id number]                                                                                                               Reason for Appointment:  Hyperthyroidism    Chief complaint: Follow-up   History of Present Illness:   At the time of diagnosis she was having some symptoms of feeling warm, sweating and shakiness.  May have also started losing weight.  Did not have any significant palpitations Her PCP currently was doing screening lab work and had abnormal thyroid levels Initial abnormal free T4 was 3.1 as of 04/18/2021, previously TSH was undetectable on 03/27/2021 and prior to that normal   She was started on methimazole for her hyperthyroidism in 08/2021 initially with 80 mg a day With this her symptoms improved and methimazole was reduced subsequently  More recent history:  She had marked variability in her thyroid levels and methimazole requirements in 2023 She continued to have persistent hyperthyroidism and high thyrotropin receptor antibody in 2023 She had I-131 treatment on 07/18/2022 and received 12.55 mCi  On her last visit in February she was told to taper off and stop methimazole in 2 weeks In the last few days she has had more fatigue, cold intolerance Also occasionally may have a transient feeling of fluttering in her chest but no rapid heartbeat Currently not on any medications  Weight is slightly higher  Wt Readings from Last 3 Encounters:  10/09/22 255 lb 6.4 oz (115.8 kg)  09/13/22 250 lb 12.8 oz (113.8 kg)  09/09/22 247 lb 8 oz (112.3 kg)    Thyroid levels show low normal free T4 and TSH now up to 11.1   Lab Results  Component Value Date   FREET4 0.67 10/07/2022   FREET4 1.10 08/16/2022   FREET4 0.61 06/26/2022   T3FREE 2.6 10/07/2022   T3FREE 3.0 06/26/2022   T3FREE 2.6 05/16/2022   TSH 11.08 (H) 10/07/2022   TSH 0.07 (L) 08/16/2022   TSH 3.57 06/26/2022    Lab Results   Component Value Date   THYROTRECAB 13.20 (H) 06/26/2022   THYROTRECAB 17.00 (H) 04/12/2022   THYROTRECAB CANCELED 04/04/2022    THYROID NODULE  This was initially diagnosed in 10/22 and subsequently benign on biopsy as seen on the Afirma testing  Ultrasound exam of 05/02/2021 showed the following  1. Borderline thyromegaly with findings suggestive of multinodular goiter. 2. Nodule #1 measuring 3.8 cm meets imaging criteria to recommend percutaneous sampling as indicated  Thyroid scan of 03/04/2022 showed the following  24 hour I-123 uptake = 81.1% (normal 10-30%) Large cold RIGHT thyroid nodule, benign by FNA with Afirma.   Homogeneous increased tracer uptake in the thyroid lobes bilaterally with markedly elevated 4 hour and 24 hour radio iodine uptakes.  Allergies as of 10/09/2022       Reactions   Lisinopril Hives, Rash        Medication List  Accurate as of October 09, 2022  9:56 AM. If you have any questions, ask your nurse or doctor.          STOP taking these medications    chlorpheniramine-HYDROcodone 10-8 MG/5ML Commonly known as: TUSSIONEX Stopped by: Elayne Snare, MD   Cholecalciferol 1.25 MG (50000 UT) Tabs Stopped by: Elayne Snare, MD   methimazole 5 MG tablet Commonly known as: TAPAZOLE Stopped by: Elayne Snare, MD   metoprolol succinate 25 MG 24 hr tablet Commonly known as: TOPROL-XL Stopped by: Elayne Snare, MD       TAKE these medications    albuterol 108 (90 Base) MCG/ACT inhaler Commonly known as: VENTOLIN HFA Inhale 1-2 puffs into the lungs every 4 (four) hours as needed for wheezing or shortness of breath.   albuterol 108 (90 Base) MCG/ACT inhaler Commonly known as: VENTOLIN HFA Inhale 2 puffs into the lungs every 6 (six) hours as needed for wheezing or shortness of breath.   amLODipine 10 MG tablet Commonly known as: NORVASC TAKE (1) TABLET DAILY.   azelastine 0.1 % nasal spray Commonly known as: ASTELIN Place 1 spray into  both nostrils 2 (two) times daily. Use in each nostril as directed   budesonide-formoterol 160-4.5 MCG/ACT inhaler Commonly known as: SYMBICORT Inhale 2 puffs into the lungs 2 (two) times daily.   escitalopram 10 MG tablet Commonly known as: LEXAPRO TAKE 1 TABLET DAILY.   fluticasone 50 MCG/ACT nasal spray Commonly known as: FLONASE Place 2 sprays into both nostrils daily.   gabapentin 300 MG capsule Commonly known as: NEURONTIN TAKE TWO CAPSULES BY MOUTH EVERY MORNING, ONE CAPSULE midday AND TWO CAPSULES EVERY EVENING   losartan 25 MG tablet Commonly known as: COZAAR TAKE ONE AND ONE-HALF TABLETS DAILY.   predniSONE 10 MG tablet Commonly known as: DELTASONE Take 4 tablets ( total 40 mg) by mouth for 2 days; take 3 tablets ( total 30 mg) by mouth for 2 days; take 2 tablets ( total 20 mg) by mouth for 1 day; take 1 tablet ( total 10 mg) by mouth for 1 day.   triamcinolone cream 0.1 % Commonly known as: KENALOG Apply 1 application topically 4 (four) times daily as needed.   zolpidem 10 MG tablet Commonly known as: AMBIEN Take 1 tablet (10 mg total) by mouth at bedtime as needed. 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 Jamal Collin   COLONOSCOPY WITH PROPOFOL N/A 01/26/2016   Procedure: COLONOSCOPY WITH PROPOFOL through colostomy;  Surgeon: Lucilla Lame, MD;  Location: [place];  Service: Endoscopy;  Laterality: N/A;  port-a-cath   COLONOSCOPY WITH PROPOFOL  N/A 11/29/2019   Procedure: COLONOSCOPY WITH PROPOFOL;  Surgeon: Lucilla Lame, MD;  Location: [place];  Service: Endoscopy;  Laterality: N/A;  POLYPECTOMY  01/26/2016   Procedure: POLYPECTOMY;  Surgeon: Lucilla Lame, MD;  Location: [place];  Service: Endoscopy;;   POLYPECTOMY N/A 11/29/2019   Procedure: POLYPECTOMY;  Surgeon: Lucilla Lame, MD;  Location: [place];  Service: Endoscopy;  Laterality: N/A;   PORTA CATH REMOVAL N/A 01/28/2018   Procedure: PORTA CATH REMOVAL;  Surgeon: Algernon Huxley, MD;  Location: [place] [facility];  Service: Cardiovascular;  Laterality: N/A;   PORTACATH PLACEMENT     PROCTECTOMY  04/12/14   [facility] - CA surgery - with colostomy   SALPINGOOPHORECTOMY Bilateral 04/12/14   [facility] - with CA surg   VAGINA RECONSTRUCTION SURGERY  04/12/14   [facility] - transabdonimal rectus abdominus muscle reconstruction   VAGINAL DELIVERY     2, preterm labor    Family History  Problem Relation Age of Onset   Thyroid disease Mother    Arthritis Mother    Stroke Mother    Hypertension Mother    Diabetes Mother    Arthritis Father    Heart disease Father 26       s/p CABG   Stroke Father    Hypertension Father    Alcohol abuse Maternal Grandmother    Diabetes Maternal Grandmother    Alcohol abuse Maternal Grandfather    Diabetes Maternal Grandfather    Breast cancer Paternal Grandmother    Cancer Paternal Grandmother        breast   Heart disease Paternal Grandfather    Breast cancer Paternal Aunt 52   Colon cancer Neg Hx     Social History:  reports that she has never smoked. She has never used smokeless tobacco. She reports that she does not drink alcohol and does not use drugs.  Allergies:  Allergies  Allergen Reactions   Lisinopril Hives and Rash     Review of Systems  Has history of hypertension  BP Readings from Last 3 Encounters:  10/09/22 (!) 144/86  09/13/22 (!) 146/88  09/09/22 124/78    Examination:   BP (!) 144/86  (BP Location: Left Arm, Patient Position: Sitting, Cuff Size: Normal)   Pulse 60   Ht 5\' 5"  (1.651 m)   Wt 255 lb 6.4 oz (115.8 kg)   LMP 12/11/2013   SpO2 98%   BMI 42.50 kg/m     THYROID exam: Right lobe is palpable about 1-1/2-2 times normal and firm, better felt on swallowing Left lobe not palpable          Reflexes show slightly slow relaxation at biceps No peripheral edema  Heart rhythm is normal regular and heart rate about 64   Assessment/Plan:   Hyperthyroidism, secondary to Graves' disease  She had hyperthyroidism since 03/2021 She had I-131 treatment on 07/18/2022  She has now become hypothyroid with some symptoms of hypothyroidism and weight gain Although free T4 is only low normal her TSH is now 11  Unclear how what her levothyroxine requirement is at this time and with free T4 not being significantly low will start with only 50 mcg, she will let us know if she is not subjectively doing better in the next 2 to 3 weeks Follow-up in 6 weeks  Patterson Hollenbaugh 10/09/2022, 9:56 AM    Note: This office note was prepared with Dragon voice recognition system technology. Any transcriptional errors that result from this process are unintentional.

## 2022-11-04 ENCOUNTER — Other Ambulatory Visit: Payer: Self-pay | Admitting: Family

## 2022-11-04 DIAGNOSIS — I10 Essential (primary) hypertension: Secondary | ICD-10-CM

## 2022-11-08 ENCOUNTER — Ambulatory Visit: Payer: 59 | Admitting: Family

## 2022-11-25 ENCOUNTER — Encounter: Payer: Self-pay | Admitting: Family

## 2022-11-26 ENCOUNTER — Encounter: Payer: Self-pay | Admitting: Family

## 2022-11-26 ENCOUNTER — Ambulatory Visit: Payer: 59 | Admitting: Family

## 2022-11-26 VITALS — BP 136/86 | HR 71 | Temp 99.1°F | Ht 65.0 in | Wt 251.6 lb

## 2022-11-26 DIAGNOSIS — J40 Bronchitis, not specified as acute or chronic: Secondary | ICD-10-CM | POA: Diagnosis not present

## 2022-11-26 DIAGNOSIS — I10 Essential (primary) hypertension: Secondary | ICD-10-CM | POA: Diagnosis not present

## 2022-11-26 DIAGNOSIS — F419 Anxiety disorder, unspecified: Secondary | ICD-10-CM | POA: Diagnosis not present

## 2022-11-26 DIAGNOSIS — Z8639 Personal history of other endocrine, nutritional and metabolic disease: Secondary | ICD-10-CM | POA: Diagnosis not present

## 2022-11-26 DIAGNOSIS — Z1231 Encounter for screening mammogram for malignant neoplasm of breast: Secondary | ICD-10-CM

## 2022-11-26 DIAGNOSIS — F32A Depression, unspecified: Secondary | ICD-10-CM | POA: Diagnosis not present

## 2022-11-26 MED ORDER — AZITHROMYCIN 250 MG PO TABS: ORAL_TABLET | ORAL | 0 refills | Status: AC

## 2022-11-26 MED ORDER — ESCITALOPRAM OXALATE 20 MG PO TABS: 20.0000 mg | ORAL_TABLET | Freq: Every day | ORAL | 3 refills | Status: DC

## 2022-11-26 MED ORDER — LOSARTAN POTASSIUM 50 MG PO TABS: 50.0000 mg | ORAL_TABLET | Freq: Every day | ORAL | 3 refills | Status: DC

## 2022-11-26 NOTE — Progress Notes (Signed)
Assessment & Plan:  Bronchitis Assessment & Plan: Afebrile.  No acute respiratory distress.  Based on duration of symptoms, start Zithromax.  Continue albuterol, Symbicort.  Patient will let me know how she is doing.    Orders: -     Azithromycin; Take 2 tablets on day 1, then 1 tablet daily on days 2 through 5  Dispense: 6 tablet; Refill: 0  Encounter for screening mammogram for malignant neoplasm of breast -     3D Screening Mammogram, Left and Right; Future  Primary hypertension Assessment & Plan: Reviewed previous blood pressure readings which have been elevated.  Increased losartan to 50 mg.  Continue amlodipine 10 mg. BMP in one week.   Orders: -     Losartan Potassium; Take 1 tablet (50 mg total) by mouth daily.  Dispense: 90 tablet; Refill: 3 -     Comprehensive metabolic panel; Future  History of vitamin D deficiency -     VITAMIN D 25 Hydroxy (Vit-D Deficiency, Fractures); Future  Anxiety and depression Assessment & Plan: Suboptimal control based on family stressors.  Increase Lexapro to 20 mg.  Orders: -     Escitalopram Oxalate; Take 1 tablet (20 mg total) by mouth daily.  Dispense: 90 tablet; Refill: 3     Return precautions given.   Risks, benefits, and alternatives of the medications and treatment plan prescribed today were discussed, and patient expressed understanding.   Education regarding symptom management and diagnosis given to patient on AVS either electronically or printed.  Return in about 1 week (around 12/03/2022).  Rennie Plowman, FNP  Subjective:    Patient ID: Tammie Robinson, female    DOB: 1966-07-19, 57 y.o.   MRN: [id number]  CC: TENE AMICUCCI is a 57 y.o. female who presents today for follow up.   HPI: Complains of runny nose , HA, sore throat started 6 days ago, worsening.  Sore throat has resolved.   Endorses cough. She will feel 'winded' when she gets up to housework  She is using albuterol and symbicort with relief.  No  fever, chills, leg swelling, CP, left arm numbness, wheezing.   Her husband has been sick  Never smoker  She is not taking sudafed. She is taking mucinex with relief of cough.  Treated with augmentin, prednisone 08/2022 with complete resolution.   H/o osa, HTN  She has had increased anxiety and in caring for her mother. She would like to increase lexapro 10mg    No depression. No anxiety attacks.   Allergies: Lisinopril Current Outpatient Medications on File Prior to Visit  Medication Sig Dispense Refill   albuterol (PROVENTIL HFA;VENTOLIN HFA) 108 (90 Base) MCG/ACT inhaler Inhale 1-2 puffs into the lungs every 4 (four) hours as needed for wheezing or shortness of breath. 1 Inhaler 3   albuterol (VENTOLIN HFA) 108 (90 Base) MCG/ACT inhaler Inhale 2 puffs into the lungs every 6 (six) hours as needed for wheezing or shortness of breath. 8 g 0   amLODipine (NORVASC) 10 MG tablet TAKE (1) TABLET DAILY. 90 tablet 2   budesonide-formoterol (SYMBICORT) 160-4.5 MCG/ACT inhaler Inhale 2 puffs into the lungs 2 (two) times daily. 1 each 3   fluticasone (FLONASE) 50 MCG/ACT nasal spray Place 2 sprays into both nostrils daily. 16 g 6   gabapentin (NEURONTIN) 300 MG capsule TAKE TWO CAPSULES BY MOUTH EVERY MORNING, ONE CAPSULE midday AND TWO CAPSULES EVERY EVENING 150 capsule 5   levothyroxine (SYNTHROID) 50 MCG tablet Take 1 tablet (50 mcg total)  by mouth daily. 90 tablet 3   triamcinolone cream (KENALOG) 0.1 % Apply 1 application topically 4 (four) times daily as needed. 80 g 1   zolpidem (AMBIEN) 10 MG tablet Take 1 tablet (10 mg total) by mouth at bedtime as needed. for sleep 30 tablet 2   Current Facility-Administered Medications on File Prior to Visit  Medication Dose Route Frequency Provider Last Rate Last Admin   heparin lock flush 100 unit/mL  500 Units Intravenous Once Janese Banks, MD       sodium chloride 0.9 % injection 10 mL  10 mL Intravenous PRN Janese Banks, MD       sodium  chloride 0.9 % injection 10 mL  10 mL Intravenous PRN Janese Banks, MD   10 mL at 11/28/14 1120   sodium chloride flush (NS) 0.9 % injection 10 mL  10 mL Intravenous PRN Loann Quill, NP   10 mL at 01/03/16 1413   sodium chloride flush (NS) 0.9 % injection 10 mL  10 mL Intravenous PRN Jeralyn Ruths, MD   10 mL at 11/15/16 1111   sodium chloride flush (NS) 0.9 % injection 10 mL  10 mL Intravenous PRN Earna Coder, MD   10 mL at 11/24/17 1434    Review of Systems  Constitutional:  Negative for chills and fever.  HENT:  Positive for congestion. Negative for sinus pressure and sore throat.   Respiratory:  Positive for cough and shortness of breath. Negative for wheezing.   Cardiovascular:  Negative for chest pain and palpitations.  Gastrointestinal:  Negative for nausea and vomiting.  Psychiatric/Behavioral:  Negative for sleep disturbance. The patient is nervous/anxious.       Objective:    BP 136/86   Pulse 71   Temp 99.1 F (37.3 C) (Oral)   Ht 5\' 5"  (1.651 m)   Wt 251 lb 9.6 oz (114.1 kg)   LMP 12/11/2013   SpO2 98%   BMI 41.87 kg/m  BP Readings from Last 3 Encounters:  11/26/22 136/86  10/09/22 (!) 144/86  09/13/22 (!) 146/88   Wt Readings from Last 3 Encounters:  11/26/22 251 lb 9.6 oz (114.1 kg)  10/09/22 255 lb 6.4 oz (115.8 kg)  09/13/22 250 lb 12.8 oz (113.8 kg)    Physical Exam Vitals reviewed.  Constitutional:      Appearance: She is well-developed.  HENT:     Head: Normocephalic and atraumatic.     Right Ear: Hearing, tympanic membrane, ear canal and external ear normal. No decreased hearing noted. No drainage, swelling or tenderness. No middle ear effusion. No foreign body. Tympanic membrane is not erythematous or bulging.     Left Ear: Hearing, tympanic membrane, ear canal and external ear normal. No decreased hearing noted. No drainage, swelling or tenderness.  No middle ear effusion. No foreign body. Tympanic membrane is not  erythematous or bulging.     Nose: Nose normal. No rhinorrhea.     Right Sinus: No maxillary sinus tenderness or frontal sinus tenderness.     Left Sinus: No maxillary sinus tenderness or frontal sinus tenderness.     Mouth/Throat:     Pharynx: Uvula midline. No oropharyngeal exudate or posterior oropharyngeal erythema.     Tonsils: No tonsillar abscesses.  Eyes:     Conjunctiva/sclera: Conjunctivae normal.  Cardiovascular:     Rate and Rhythm: Regular rhythm.     Pulses: Normal pulses.     Heart sounds: Normal heart sounds.  Pulmonary:  Effort: Pulmonary effort is normal.     Breath sounds: Normal breath sounds. No wheezing, rhonchi or rales.  Lymphadenopathy:     Head:     Right side of head: No submental, submandibular, tonsillar, preauricular, posterior auricular or occipital adenopathy.     Left side of head: No submental, submandibular, tonsillar, preauricular, posterior auricular or occipital adenopathy.     Cervical: No cervical adenopathy.  Skin:    General: Skin is warm and dry.  Neurological:     Mental Status: She is alert.  Psychiatric:        Speech: Speech normal.        Behavior: Behavior normal.        Thought Content: Thought content normal.

## 2022-11-26 NOTE — Assessment & Plan Note (Signed)
Reviewed previous blood pressure readings which have been elevated.  Increased losartan to 50 mg.  Continue amlodipine 10 mg. BMP in one week.

## 2022-11-26 NOTE — Assessment & Plan Note (Signed)
Suboptimal control based on family stressors.  Increase Lexapro to 20 mg.

## 2022-11-26 NOTE — Assessment & Plan Note (Addendum)
Afebrile.  No acute respiratory distress.  Based on duration of symptoms, start Zithromax.  Continue albuterol, Symbicort.  Patient will let me know how she is doing.

## 2022-11-28 ENCOUNTER — Other Ambulatory Visit: Payer: Self-pay | Admitting: Family

## 2022-11-28 DIAGNOSIS — J40 Bronchitis, not specified as acute or chronic: Secondary | ICD-10-CM

## 2022-11-28 MED ORDER — PREDNISONE 10 MG PO TABS: ORAL_TABLET | ORAL | 0 refills | Status: DC

## 2022-11-29 ENCOUNTER — Other Ambulatory Visit (INDEPENDENT_AMBULATORY_CARE_PROVIDER_SITE_OTHER): Payer: 59

## 2022-11-29 DIAGNOSIS — E890 Postprocedural hypothyroidism: Secondary | ICD-10-CM

## 2022-11-29 LAB — TSH: TSH: 0.17 u[IU]/mL — ABNORMAL LOW (ref 0.35–5.50)

## 2022-11-29 LAB — T3, FREE: T3, Free: 3.2 pg/mL (ref 2.3–4.2)

## 2022-11-29 LAB — T4, FREE: Free T4: 0.98 ng/dL (ref 0.60–1.60)

## 2022-12-01 ENCOUNTER — Other Ambulatory Visit: Payer: Self-pay | Admitting: Family

## 2022-12-01 DIAGNOSIS — C20 Malignant neoplasm of rectum: Secondary | ICD-10-CM

## 2022-12-02 ENCOUNTER — Encounter: Payer: Self-pay | Admitting: Endocrinology

## 2022-12-02 ENCOUNTER — Ambulatory Visit: Payer: 59 | Admitting: Endocrinology

## 2022-12-02 VITALS — BP 138/84 | HR 59 | Ht 65.0 in | Wt 253.6 lb

## 2022-12-02 DIAGNOSIS — E041 Nontoxic single thyroid nodule: Secondary | ICD-10-CM | POA: Diagnosis not present

## 2022-12-02 DIAGNOSIS — E890 Postprocedural hypothyroidism: Secondary | ICD-10-CM | POA: Diagnosis not present

## 2022-12-02 NOTE — Patient Instructions (Signed)
Alternate 1/2 with 1 pills

## 2022-12-02 NOTE — Progress Notes (Signed)
Patient ID: Tammie Robinson, female   DOB: [birth year], 57 y.o.   MRN: [id number]                                                                                                               Reason for Appointment:  Hyperthyroidism    Chief complaint: Follow-up   History of Present Illness:   At the time of diagnosis she was having some symptoms of feeling warm, sweating and shakiness.  May have also started losing weight.  Did not have any significant palpitations Her PCP currently was doing screening lab work and had abnormal thyroid levels Initial abnormal free T4 was 3.1 as of 04/18/2021, previously TSH was undetectable on 03/27/2021 and prior to that normal   She was started on methimazole for her hyperthyroidism in 08/2021 initially with 80 mg a day With this her symptoms improved and methimazole was reduced subsequently  More recent history:  She had marked variability in her thyroid levels and methimazole requirements in 2023 She continued to have persistent hyperthyroidism and high thyrotropin receptor antibody in 2023 She had I-131 treatment on 07/18/2022 and received 12.55 mCi  On her last visit she had more fatigue, cold intolerance Because of her TSH being 11.1 she is now taking 50 mcg of levothyroxine daily However she still thinks she has had some fatigue, also has hot and cold feelings Weight is down 2 pounds  Weight is slightly lower   Wt Readings from Last 3 Encounters:  12/02/22 253 lb 9.6 oz (115 kg)  11/26/22 251 lb 9.6 oz (114.1 kg)  10/09/22 255 lb 6.4 oz (115.8 kg)    Thyroid levels show improved free T4 and low TSH   Lab Results  Component Value Date   FREET4 0.98 11/29/2022   FREET4 0.67 10/07/2022   FREET4 1.10 08/16/2022   T3FREE 3.2 11/29/2022   T3FREE 2.6 10/07/2022   T3FREE 3.0 06/26/2022   TSH 0.17 (L) 11/29/2022   TSH 11.08 (H) 10/07/2022   TSH 0.07 (L) 08/16/2022    Lab Results  Component Value Date   THYROTRECAB 13.20 (H)  06/26/2022   THYROTRECAB 17.00 (H) 04/12/2022   THYROTRECAB CANCELED 04/04/2022    THYROID NODULE  This was initially diagnosed in 10/22 and subsequently benign on biopsy as seen on the Afirma testing  Ultrasound exam of 05/02/2021 showed the following  1. Borderline thyromegaly with findings suggestive of multinodular goiter. 2. Nodule #1 measuring 3.8 cm meets imaging criteria to recommend percutaneous sampling as indicated  Thyroid scan of 03/04/2022 showed the following  24 hour I-123 uptake = 81.1% (normal 10-30%) Large cold RIGHT thyroid nodule    Homogeneous increased tracer uptake in the thyroid lobes bilaterally with markedly elevated 4 hour and 24 hour radio iodine uptakes.  Needle aspiration biopsy was benign by FNA with Afirma.  Allergies as of 12/02/2022       Reactions   Lisinopril Hives, Rash        Medication List        Accurate  as of Dec 02, 2022  4:04 PM. If you have any questions, ask your nurse or doctor.          albuterol 108 (90 Base) MCG/ACT inhaler Commonly known as: VENTOLIN HFA Inhale 1-2 puffs into the lungs every 4 (four) hours as needed for wheezing or shortness of breath.   albuterol 108 (90 Base) MCG/ACT inhaler Commonly known as: VENTOLIN HFA Inhale 2 puffs into the lungs every 6 (six) hours as needed for wheezing or shortness of breath.   amLODipine 10 MG tablet Commonly known as: NORVASC TAKE (1) TABLET DAILY.   budesonide-formoterol 160-4.5 MCG/ACT inhaler Commonly known as: SYMBICORT Inhale 2 puffs into the lungs 2 (two) times daily.   escitalopram 20 MG tablet Commonly known as: Lexapro Take 1 tablet (20 mg total) by mouth daily.   fluticasone 50 MCG/ACT nasal spray Commonly known as: FLONASE Place 2 sprays into both nostrils daily.   gabapentin 300 MG capsule Commonly known as: NEURONTIN TAKE TWO CAPSULES BY MOUTH EVERY MORNING, ONE CAPSULE midday AND TWO CAPSULES EVERY EVENING   levothyroxine 50 MCG  tablet Commonly known as: SYNTHROID Take 1 tablet (50 mcg total) by mouth daily.   losartan 50 MG tablet Commonly known as: COZAAR Take 1 tablet (50 mg total) by mouth daily.   predniSONE 10 MG tablet Commonly known as: DELTASONE Take 4 tablets ( total 40 mg) by mouth for 2 days; take 3 tablets ( total 30 mg) by mouth for 2 days; take 2 tablets ( total 20 mg) by mouth for 1 day; take 1 tablet ( total 10 mg) by mouth for 1 day.   triamcinolone cream 0.1 % Commonly known as: KENALOG Apply 1 application topically 4 (four) times daily as needed.   zolpidem 10 MG tablet Commonly known as: AMBIEN Take 1 tablet (10 mg total) by mouth at bedtime as needed. 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 Evette Cristal   COLONOSCOPY WITH PROPOFOL N/A 01/26/2016   Procedure: COLONOSCOPY WITH PROPOFOL through colostomy;  Surgeon: Midge Minium, MD;  Location: [facility];  Service: Endoscopy;  Laterality: N/A;  port-a-cath   COLONOSCOPY WITH PROPOFOL N/A 11/29/2019   Procedure: COLONOSCOPY WITH PROPOFOL;  Surgeon: Midge Minium, MD;  Location: [facility];  Service: Endoscopy;  Laterality: N/A;   POLYPECTOMY  01/26/2016   Procedure: POLYPECTOMY;  Surgeon: Midge Minium, MD;  Location: [facility];  Service: Endoscopy;;   POLYPECTOMY N/A 11/29/2019   Procedure: POLYPECTOMY;  Surgeon: Midge Minium, MD;  Location: [facility];  Service: Endoscopy;  Laterality: N/A;   PORTA  CATH REMOVAL N/A 01/28/2018   Procedure: PORTA CATH REMOVAL;  Surgeon: Annice Needy, MD;  Location: [facility] INVASIVE CV LAB;  Service: Cardiovascular;  Laterality: N/A;  PORTACATH PLACEMENT     PROCTECTOMY  04/12/14   [facility] - CA surgery - with colostomy   SALPINGOOPHORECTOMY Bilateral 04/12/14   [facility] - with CA surg   VAGINA RECONSTRUCTION SURGERY  04/12/14   [facility] - transabdonimal rectus abdominus muscle reconstruction   VAGINAL DELIVERY     2, preterm labor    Family History  Problem Relation Age of Onset   Thyroid disease Mother    Arthritis Mother    Stroke Mother    Hypertension Mother    Diabetes Mother    Arthritis Father    Heart disease Father 67       s/p CABG   Stroke Father    Hypertension Father    Alcohol abuse Maternal Grandmother    Diabetes Maternal Grandmother    Alcohol abuse Maternal Grandfather    Diabetes Maternal Grandfather    Breast cancer Paternal Grandmother    Cancer Paternal Grandmother        breast   Heart disease Paternal Grandfather    Breast cancer Paternal Aunt 53   Colon cancer Neg Hx     Social History:  reports that she has never smoked. She has never used smokeless tobacco. She reports that she does not drink alcohol and does not use drugs.  Allergies:  Allergies  Allergen Reactions   Lisinopril Hives and Rash     Review of Systems  Has history of hypertension  BP Readings from Last 3 Encounters:  12/02/22 138/84  11/26/22 136/86  10/09/22 (!) 144/86    Examination:   BP 138/84 (BP Location: Left Arm, Patient Position: Sitting, Cuff Size: Normal)   Pulse (!) 59   Ht 5\' 5"  (1.651 m)   Wt 253 lb 9.6 oz (115 kg)   LMP 12/11/2013   BMI 42.20 kg/m     THYROID exam: Right lobe is smooth and diffuse, about twice normal Left lobe not palpable          Reflexes show slightly brisk relaxation at biceps    Assessment/Plan:  Hypothyroidism, secondary to I-131 treatment of Graves' disease  She had hyperthyroidism since  03/2021 She had I-131 treatment on 07/18/2022  She has mild hypothyroidism but currently even with 50 mcg supplementation her TSH is slightly low Difficult to assess her symptoms she has had intercurrent respiratory illnesses also  Unlikely that her thyroid levels are abnormal enough to cause symptoms For now she can alternate 50 mcg with 25 mcg of levothyroxine for the next 2 months  Thyroid nodule: This was benign on biopsy last year and will be followed clinically for now  Reather Littler 12/02/2022, 4:04 PM    Note: This office note was prepared with Dragon voice recognition system technology. Any transcriptional errors that result from this process are unintentional.

## 2022-12-04 ENCOUNTER — Ambulatory Visit: Payer: 59 | Admitting: Family

## 2022-12-04 ENCOUNTER — Encounter: Payer: Self-pay | Admitting: Family

## 2022-12-04 VITALS — BP 134/86 | HR 65 | Temp 98.0°F | Ht 67.0 in | Wt 254.2 lb

## 2022-12-04 DIAGNOSIS — G4700 Insomnia, unspecified: Secondary | ICD-10-CM

## 2022-12-04 DIAGNOSIS — Z8639 Personal history of other endocrine, nutritional and metabolic disease: Secondary | ICD-10-CM

## 2022-12-04 DIAGNOSIS — E538 Deficiency of other specified B group vitamins: Secondary | ICD-10-CM

## 2022-12-04 DIAGNOSIS — I10 Essential (primary) hypertension: Secondary | ICD-10-CM | POA: Diagnosis not present

## 2022-12-04 LAB — COMPREHENSIVE METABOLIC PANEL
ALT: 43 U/L — ABNORMAL HIGH (ref 0–35)
AST: 36 U/L (ref 0–37)
Albumin: 3.8 g/dL (ref 3.5–5.2)
Alkaline Phosphatase: 88 U/L (ref 39–117)
BUN: 16 mg/dL (ref 6–23)
CO2: 31 mEq/L (ref 19–32)
Calcium: 8.8 mg/dL (ref 8.4–10.5)
Chloride: 102 mEq/L (ref 96–112)
Creatinine, Ser: 0.92 mg/dL (ref 0.40–1.20)
GFR: 69.15 mL/min (ref >60.00)
Glucose, Bld: 90 mg/dL (ref 70–99)
Potassium: 3.7 mEq/L (ref 3.5–5.1)
Sodium: 139 mEq/L (ref 135–145)
Total Bilirubin: 0.4 mg/dL (ref 0.2–1.2)
Total Protein: 6.7 g/dL (ref 6.0–8.3)

## 2022-12-04 LAB — B12 AND FOLATE PANEL
Folate: 12 ng/mL (ref >5.9)
Vitamin B-12: 381 pg/mL (ref 211–911)

## 2022-12-04 LAB — VITAMIN D 25 HYDROXY (VIT D DEFICIENCY, FRACTURES): VITD: 14.69 ng/mL — ABNORMAL LOW (ref 30.00–100.00)

## 2022-12-04 NOTE — Progress Notes (Signed)
Assessment & Plan:  B12 deficiency -     B12 and Folate Panel; Future  Primary hypertension Assessment & Plan: Slight improvement in blood pressure.  We agreed to monitor at home before further escalations.  Continue losartan to 50 mg,amlodipine 10 mg.   Orders: -     Comprehensive metabolic panel  History of vitamin D deficiency -     VITAMIN D 25 Hydroxy (Vit-D Deficiency, Fractures)  Insomnia, unspecified type Assessment & Plan: Chronic, stable.  Continue Ambien 10 mg qpm.       Return precautions given.   Risks, benefits, and alternatives of the medications and treatment plan prescribed today were discussed, and patient expressed understanding.   Education regarding symptom management and diagnosis given to patient on AVS either electronically or printed.  No follow-ups on file.  Rennie Plowman, FNP  Subjective:    Patient ID: Tammie Robinson, female    DOB: [birth year], 57 y.o.   MRN: [id number]  CC: Tammie Robinson is a 57 y.o. female who presents today for follow up.   HPI: Feels well today, no new complaints   She has completed Zithromax, prednisone. Cough resolved.   Compliant with losartan 50 mg, amlodipine 10 mg   Allergies: Lisinopril Current Outpatient Medications on File Prior to Visit  Medication Sig Dispense Refill   albuterol (PROVENTIL HFA;VENTOLIN HFA) 108 (90 Base) MCG/ACT inhaler Inhale 1-2 puffs into the lungs every 4 (four) hours as needed for wheezing or shortness of breath. 1 Inhaler 3   albuterol (VENTOLIN HFA) 108 (90 Base) MCG/ACT inhaler Inhale 2 puffs into the lungs every 6 (six) hours as needed for wheezing or shortness of breath. 8 g 0   amLODipine (NORVASC) 10 MG tablet TAKE (1) TABLET DAILY. 90 tablet 2   budesonide-formoterol (SYMBICORT) 160-4.5 MCG/ACT inhaler Inhale 2 puffs into the lungs 2 (two) times daily. 1 each 3   escitalopram (LEXAPRO) 20 MG tablet Take 1 tablet (20 mg total) by mouth daily. 90 tablet 3   fluticasone  (FLONASE) 50 MCG/ACT nasal spray Place 2 sprays into both nostrils daily. 16 g 6   gabapentin (NEURONTIN) 300 MG capsule TAKE TWO CAPSULES BY MOUTH EVERY MORNING, ONE CAPSULE midday AND TWO CAPSULES EVERY EVENING 300 capsule 6   levothyroxine (SYNTHROID) 50 MCG tablet Take 1 tablet (50 mcg total) by mouth daily. 90 tablet 3   losartan (COZAAR) 50 MG tablet Take 1 tablet (50 mg total) by mouth daily. 90 tablet 3   triamcinolone cream (KENALOG) 0.1 % Apply 1 application topically 4 (four) times daily as needed. 80 g 1   zolpidem (AMBIEN) 10 MG tablet Take 1 tablet (10 mg total) by mouth at bedtime as needed. for sleep 30 tablet 2   Current Facility-Administered Medications on File Prior to Visit  Medication Dose Route Frequency Provider Last Rate Last Admin   heparin lock flush 100 unit/mL  500 Units Intravenous Once Janese Banks, MD       sodium chloride 0.9 % injection 10 mL  10 mL Intravenous PRN Janese Banks, MD       sodium chloride 0.9 % injection 10 mL  10 mL Intravenous PRN Janese Banks, MD   10 mL at 11/28/14 1120   sodium chloride flush (NS) 0.9 % injection 10 mL  10 mL Intravenous PRN Loann Quill, NP   10 mL at 01/03/16 1413   sodium chloride flush (NS) 0.9 % injection 10 mL  10 mL Intravenous PRN Jeralyn Ruths,  MD   10 mL at 11/15/16 1111   sodium chloride flush (NS) 0.9 % injection 10 mL  10 mL Intravenous PRN Earna Coder, MD   10 mL at 11/24/17 1434    Review of Systems  Constitutional:  Negative for chills and fever.  Respiratory:  Negative for cough and shortness of breath.   Cardiovascular:  Negative for chest pain and palpitations.  Gastrointestinal:  Negative for nausea and vomiting.      Objective:    BP 134/86   Pulse 65   Temp 98 F (36.7 C) (Oral)   Ht 5\' 7"  (1.702 m)   Wt 254 lb 3.2 oz (115.3 kg)   LMP 12/11/2013   SpO2 98%   BMI 39.81 kg/m  BP Readings from Last 3 Encounters:  12/04/22 134/86  12/02/22 138/84  11/26/22  136/86   Wt Readings from Last 3 Encounters:  12/04/22 254 lb 3.2 oz (115.3 kg)  12/02/22 253 lb 9.6 oz (115 kg)  11/26/22 251 lb 9.6 oz (114.1 kg)    Physical Exam Vitals reviewed.  Constitutional:      Appearance: She is well-developed.  Eyes:     Conjunctiva/sclera: Conjunctivae normal.  Cardiovascular:     Rate and Rhythm: Normal rate and regular rhythm.     Pulses: Normal pulses.     Heart sounds: Normal heart sounds.  Pulmonary:     Effort: Pulmonary effort is normal.     Breath sounds: Normal breath sounds. No wheezing, rhonchi or rales.  Skin:    General: Skin is warm and dry.  Neurological:     Mental Status: She is alert.  Psychiatric:        Speech: Speech normal.        Behavior: Behavior normal.        Thought Content: Thought content normal.

## 2022-12-06 ENCOUNTER — Other Ambulatory Visit: Payer: Self-pay | Admitting: Family

## 2022-12-06 DIAGNOSIS — Z8639 Personal history of other endocrine, nutritional and metabolic disease: Secondary | ICD-10-CM

## 2022-12-06 DIAGNOSIS — R899 Unspecified abnormal finding in specimens from other organs, systems and tissues: Secondary | ICD-10-CM

## 2022-12-06 MED ORDER — CHOLECALCIFEROL 1.25 MG (50000 UT) PO TABS
ORAL_TABLET | ORAL | 0 refills | Status: DC
Start: 2022-12-06 — End: 2023-05-14

## 2022-12-06 NOTE — Patient Instructions (Signed)
? ?  Monitor blood pressure at home and me 5-6 reading on separate days. Goal is less than 120/80, based on newest guidelines, however we certainly want to be less than 130/80;  if persistently higher, please make sooner follow up appointment so we can recheck you blood pressure and manage/ adjust medications. ? ?

## 2022-12-06 NOTE — Assessment & Plan Note (Signed)
Slight improvement in blood pressure.  We agreed to monitor at home before further escalations.  Continue losartan to 50 mg,amlodipine 10 mg.

## 2022-12-06 NOTE — Assessment & Plan Note (Signed)
Chronic, stable.  Continue Ambien 10 mg qpm.  

## 2022-12-17 ENCOUNTER — Encounter: Payer: Self-pay | Admitting: Family

## 2023-01-24 ENCOUNTER — Other Ambulatory Visit (INDEPENDENT_AMBULATORY_CARE_PROVIDER_SITE_OTHER): Payer: 59

## 2023-01-24 DIAGNOSIS — E890 Postprocedural hypothyroidism: Secondary | ICD-10-CM

## 2023-01-24 LAB — TSH: TSH: 0.02 u[IU]/mL — ABNORMAL LOW (ref 0.35–5.50)

## 2023-01-24 LAB — T4, FREE: Free T4: 0.96 ng/dL (ref 0.60–1.60)

## 2023-01-28 ENCOUNTER — Ambulatory Visit: Payer: 59 | Admitting: Endocrinology

## 2023-01-28 VITALS — BP 130/70 | HR 69 | Ht 65.0 in | Wt 252.4 lb

## 2023-01-28 DIAGNOSIS — E890 Postprocedural hypothyroidism: Secondary | ICD-10-CM | POA: Diagnosis not present

## 2023-01-28 NOTE — Progress Notes (Signed)
Patient ID: Tammie Robinson, female   DOB: 12/09/1965, 57 y.o.   MRN: [id number]                                                                                                               Reason for Appointment: Follow-up of thyroid    Chief complaint: Follow-up   History of Present Illness:   At the time of diagnosis she was having some symptoms of feeling warm, sweating and shakiness.  May have also started losing weight.  Did not have any significant palpitations Her PCP currently was doing screening lab work and had abnormal thyroid levels Initial abnormal free T4 was 3.1 as of 04/18/2021, previously TSH was undetectable on 03/27/2021 and prior to that normal   She was started on methimazole for her hyperthyroidism in 08/2021 initially with 80 mg a day With this her symptoms improved and methimazole was reduced subsequently  She continued to have persistent hyperthyroidism and high thyrotropin receptor antibody in 2023 She had I-131 treatment on 07/18/2022 and received 12.55 mCi  More recent history:  Because of her TSH being 11.1 she was initially started on 50 mcg of levothyroxine daily She had only some improvement in her fatigue with this However in 5/24 her TSH was low and she is now taking 50 mcg alternating with 25 mcg  Overall she feels fairly good, weight has leveled off Palpitations not present  Weight is slightly lower   Wt Readings from Last 3 Encounters:  01/28/23 252 lb 6.4 oz (114.5 kg)  12/04/22 254 lb 3.2 oz (115.3 kg)  12/02/22 253 lb 9.6 oz (115 kg)    Thyroid levels show improved free T4 and low TSH   Lab Results  Component Value Date   FREET4 0.96 01/24/2023   FREET4 0.98 11/29/2022   FREET4 0.67 10/07/2022   T3FREE 3.2 11/29/2022   T3FREE 2.6 10/07/2022   T3FREE 3.0 06/26/2022   TSH 0.02 (L) 01/24/2023   TSH 0.17 (L) 11/29/2022   TSH 11.08 (H) 10/07/2022    Lab Results  Component Value Date   THYROTRECAB 13.20 (H) 06/26/2022    THYROTRECAB 17.00 (H) 04/12/2022   THYROTRECAB CANCELED 04/04/2022    THYROID NODULE  This was initially diagnosed in 10/22 and subsequently benign on biopsy as seen on the Afirma testing  Ultrasound exam of 05/02/2021 showed the following  1. Borderline thyromegaly with findings suggestive of multinodular goiter. 2. Nodule #1 measuring 3.8 cm meets imaging criteria to recommend percutaneous sampling as indicated  Thyroid scan of 03/04/2022 showed the following  24 hour I-123 uptake = 81.1% (normal 10-30%) Large cold RIGHT thyroid nodule    Homogeneous increased tracer uptake in the thyroid lobes bilaterally with markedly elevated 4 hour and 24 hour radio iodine uptakes.  Needle aspiration biopsy was benign by FNA with Afirma.  Allergies as of 01/28/2023       Reactions   Lisinopril Hives, Rash        Medication List        Accurate  as of January 28, 2023  5:06 PM. If you have any questions, ask your nurse or doctor.          albuterol 108 (90 Base) MCG/ACT inhaler Commonly known as: VENTOLIN HFA Inhale 1-2 puffs into the lungs every 4 (four) hours as needed for wheezing or shortness of breath.   albuterol 108 (90 Base) MCG/ACT inhaler Commonly known as: VENTOLIN HFA Inhale 2 puffs into the lungs every 6 (six) hours as needed for wheezing or shortness of breath.   amLODipine 10 MG tablet Commonly known as: NORVASC TAKE (1) TABLET DAILY.   budesonide-formoterol 160-4.5 MCG/ACT inhaler Commonly known as: SYMBICORT Inhale 2 puffs into the lungs 2 (two) times daily.   Cholecalciferol 1.25 MG (50000 UT) Tabs 50,000 units PO qwk for 8 weeks.   escitalopram 20 MG tablet Commonly known as: Lexapro Take 1 tablet (20 mg total) by mouth daily.   fluticasone 50 MCG/ACT nasal spray Commonly known as: FLONASE Place 2 sprays into both nostrils daily.   gabapentin 300 MG capsule Commonly known as: NEURONTIN TAKE TWO CAPSULES BY MOUTH EVERY MORNING, ONE CAPSULE midday AND  TWO CAPSULES EVERY EVENING   levothyroxine 50 MCG tablet Commonly known as: SYNTHROID Take 1 tablet (50 mcg total) by mouth daily.   losartan 50 MG tablet Commonly known as: COZAAR Take 1 tablet (50 mg total) by mouth daily.   triamcinolone cream 0.1 % Commonly known as: KENALOG Apply 1 application topically 4 (four) times daily as needed.   zolpidem 10 MG tablet Commonly known as: AMBIEN Take 1 tablet (10 mg total) by mouth at bedtime as needed. 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 Evette Cristal   COLONOSCOPY WITH PROPOFOL N/A 01/26/2016   Procedure: COLONOSCOPY WITH PROPOFOL through colostomy;  Surgeon: Midge Minium, MD;  Location: [facility];  Service: Endoscopy;  Laterality: N/A;  port-a-cath   COLONOSCOPY WITH PROPOFOL N/A 11/29/2019   Procedure: COLONOSCOPY WITH PROPOFOL;  Surgeon: Midge Minium, MD;  Location: [facility];  Service: Endoscopy;  Laterality: N/A;   POLYPECTOMY  01/26/2016   Procedure: POLYPECTOMY;  Surgeon: Midge Minium, MD;  Location: [facility];  Service: Endoscopy;;   POLYPECTOMY N/A 11/29/2019   Procedure: POLYPECTOMY;  Surgeon: Midge Minium, MD;  Location: [facility];  Service: Endoscopy;  Laterality: N/A;   PORTA CATH REMOVAL N/A 01/28/2018   Procedure: PORTA CATH REMOVAL;  Surgeon: Annice Needy, MD;  Location: [facility] INVASIVE CV LAB;  Service: Cardiovascular;  Laterality: N/A;   PORTACATH PLACEMENT     PROCTECTOMY   04/12/14   [facility] - CA surgery - with colostomy   SALPINGOOPHORECTOMY Bilateral 04/12/14   [facility] - with CA surg   VAGINA RECONSTRUCTION SURGERY  04/12/14   [facility] - Ardelle Lesches  rectus abdominus muscle reconstruction   VAGINAL DELIVERY     2, preterm labor    Family History  Problem Relation Age of Onset   Thyroid disease Mother    Arthritis Mother    Stroke Mother    Hypertension Mother    Diabetes Mother    Arthritis Father    Heart disease Father 61       s/p CABG   Stroke Father    Hypertension Father    Alcohol abuse Maternal Grandmother    Diabetes Maternal Grandmother    Alcohol abuse Maternal Grandfather    Diabetes Maternal Grandfather    Breast cancer Paternal Grandmother    Cancer Paternal Grandmother        breast   Heart disease Paternal Grandfather    Breast cancer Paternal Aunt 33   Colon cancer Neg Hx     Social History:  reports that she has never smoked. She has never used smokeless tobacco. She reports that she does not drink alcohol and does not use drugs.  Allergies:  Allergies  Allergen Reactions   Lisinopril Hives and Rash     Review of Systems  Has history of hypertension  BP Readings from Last 3 Encounters:  01/28/23 130/70  12/04/22 134/86  12/02/22 138/84    Examination:   BP 130/70 (BP Location: Left Arm, Patient Position: Sitting)   Pulse 69   Ht 5\' 5"  (1.651 m)   Wt 252 lb 6.4 oz (114.5 kg)   LMP 12/11/2013   SpO2 97%   BMI 42.00 kg/m     THYROID exam: Right lobe is smooth and diffuse, about 1.5-2 normal, slightly firm Left lobe not palpable          Reflexes show relaxation at biceps    Assessment/Plan:  Hypothyroidism, secondary to I-131 treatment of Graves' disease  She had hyperthyroidism since 03/2021 She had I-131 treatment on 07/18/2022  She had initially mild hypothyroidism but currently even with an average dose of 37.5 mcg supplementation her TSH is significantly suppressed with fairly normal free T4  level  She still has a palpable right lobe of the thyroid but this may be related to her previous cold nodule  She may well be euthyroid without any thyroid supplementation and will try her without any supplement and have her come back for TSH in 6 weeks  Thyroid nodule: This was benign on biopsy last year and will be followed clinically for now  Reather Littler 01/28/2023, 5:06 PM    Note: This office note was prepared with Dragon voice recognition system technology. Any transcriptional errors that result from this process are unintentional.

## 2023-02-15 ENCOUNTER — Encounter: Payer: Self-pay | Admitting: Family

## 2023-02-17 ENCOUNTER — Other Ambulatory Visit: Payer: Self-pay | Admitting: Family

## 2023-02-17 DIAGNOSIS — G4700 Insomnia, unspecified: Secondary | ICD-10-CM

## 2023-02-17 MED ORDER — ZOLPIDEM TARTRATE 10 MG PO TABS
10.0000 mg | ORAL_TABLET | Freq: Every evening | ORAL | 2 refills | Status: DC | PRN
Start: 2023-02-17 — End: 2023-05-22

## 2023-02-19 ENCOUNTER — Other Ambulatory Visit: Payer: Self-pay | Admitting: Family

## 2023-02-20 NOTE — Telephone Encounter (Signed)
LVM to call back to schedule f/up appt for Vit D recheck before refill can be placed

## 2023-02-21 ENCOUNTER — Encounter: Payer: Self-pay | Admitting: Endocrinology

## 2023-02-21 ENCOUNTER — Other Ambulatory Visit (INDEPENDENT_AMBULATORY_CARE_PROVIDER_SITE_OTHER): Payer: 59

## 2023-02-21 DIAGNOSIS — R899 Unspecified abnormal finding in specimens from other organs, systems and tissues: Secondary | ICD-10-CM | POA: Diagnosis not present

## 2023-02-21 DIAGNOSIS — Z8639 Personal history of other endocrine, nutritional and metabolic disease: Secondary | ICD-10-CM

## 2023-02-21 DIAGNOSIS — E890 Postprocedural hypothyroidism: Secondary | ICD-10-CM | POA: Diagnosis not present

## 2023-02-21 LAB — HEPATIC FUNCTION PANEL
ALT: 31 U/L (ref 0–35)
AST: 25 U/L (ref 0–37)
Albumin: 4.2 g/dL (ref 3.5–5.2)
Alkaline Phosphatase: 93 U/L (ref 39–117)
Bilirubin, Direct: 0.2 mg/dL (ref 0.0–0.3)
Total Bilirubin: 0.7 mg/dL (ref 0.2–1.2)
Total Protein: 6.9 g/dL (ref 6.0–8.3)

## 2023-02-21 LAB — T3, FREE: T3, Free: 3.4 pg/mL (ref 2.3–4.2)

## 2023-02-21 LAB — TSH: TSH: 0.27 u[IU]/mL — ABNORMAL LOW (ref 0.35–5.50)

## 2023-02-21 LAB — T4, FREE: Free T4: 0.67 ng/dL (ref 0.60–1.60)

## 2023-02-21 LAB — VITAMIN D 25 HYDROXY (VIT D DEFICIENCY, FRACTURES): VITD: 28.48 ng/mL — ABNORMAL LOW (ref 30.00–100.00)

## 2023-02-24 ENCOUNTER — Other Ambulatory Visit: Payer: Self-pay | Admitting: Endocrinology

## 2023-02-24 DIAGNOSIS — E890 Postprocedural hypothyroidism: Secondary | ICD-10-CM

## 2023-03-12 ENCOUNTER — Other Ambulatory Visit (INDEPENDENT_AMBULATORY_CARE_PROVIDER_SITE_OTHER): Payer: 59

## 2023-03-12 DIAGNOSIS — E890 Postprocedural hypothyroidism: Secondary | ICD-10-CM

## 2023-03-14 ENCOUNTER — Encounter: Payer: Self-pay | Admitting: Endocrinology

## 2023-03-14 LAB — TSH: TSH: 2.19 u[IU]/mL (ref 0.35–5.50)

## 2023-03-14 LAB — T4, FREE: Free T4: 0.6 ng/dL (ref 0.60–1.60)

## 2023-03-26 ENCOUNTER — Other Ambulatory Visit: Payer: Self-pay | Admitting: Family

## 2023-03-26 DIAGNOSIS — I10 Essential (primary) hypertension: Secondary | ICD-10-CM

## 2023-05-14 ENCOUNTER — Encounter: Payer: Self-pay | Admitting: Family

## 2023-05-14 ENCOUNTER — Other Ambulatory Visit: Payer: Self-pay | Admitting: Family

## 2023-05-14 DIAGNOSIS — J40 Bronchitis, not specified as acute or chronic: Secondary | ICD-10-CM

## 2023-05-14 MED ORDER — HYDROCOD POLI-CHLORPHE POLI ER 10-8 MG/5ML PO SUER: 5.0000 mL | Freq: Every evening | ORAL | 0 refills | Status: DC | PRN

## 2023-05-22 ENCOUNTER — Telehealth: Payer: Self-pay | Admitting: Family

## 2023-05-22 DIAGNOSIS — G4700 Insomnia, unspecified: Secondary | ICD-10-CM

## 2023-05-22 MED ORDER — ZOLPIDEM TARTRATE 10 MG PO TABS
10.0000 mg | ORAL_TABLET | Freq: Every evening | ORAL | 2 refills | Status: DC | PRN
Start: 2023-05-22 — End: 2023-08-28

## 2023-05-22 NOTE — Telephone Encounter (Signed)
close

## 2023-05-27 ENCOUNTER — Other Ambulatory Visit: Payer: Self-pay | Admitting: Endocrinology

## 2023-05-27 DIAGNOSIS — E063 Autoimmune thyroiditis: Secondary | ICD-10-CM

## 2023-05-27 DIAGNOSIS — E890 Postprocedural hypothyroidism: Secondary | ICD-10-CM

## 2023-06-03 ENCOUNTER — Other Ambulatory Visit (INDEPENDENT_AMBULATORY_CARE_PROVIDER_SITE_OTHER): Payer: 59

## 2023-06-03 DIAGNOSIS — E890 Postprocedural hypothyroidism: Secondary | ICD-10-CM | POA: Diagnosis not present

## 2023-06-03 LAB — T4, FREE: Free T4: 0.82 ng/dL (ref 0.60–1.60)

## 2023-06-03 LAB — TSH: TSH: 0.14 u[IU]/mL — ABNORMAL LOW (ref 0.35–5.50)

## 2023-06-10 ENCOUNTER — Ambulatory Visit: Payer: 59 | Admitting: Endocrinology

## 2023-06-10 ENCOUNTER — Encounter: Payer: Self-pay | Admitting: Endocrinology

## 2023-06-10 VITALS — BP 142/90 | HR 65 | Resp 20 | Ht 65.0 in | Wt 255.8 lb

## 2023-06-10 DIAGNOSIS — E041 Nontoxic single thyroid nodule: Secondary | ICD-10-CM | POA: Diagnosis not present

## 2023-06-10 DIAGNOSIS — Z8639 Personal history of other endocrine, nutritional and metabolic disease: Secondary | ICD-10-CM | POA: Diagnosis not present

## 2023-06-10 DIAGNOSIS — E890 Postprocedural hypothyroidism: Secondary | ICD-10-CM

## 2023-06-10 MED ORDER — LEVOTHYROXINE SODIUM 25 MCG PO TABS: 25.0000 ug | ORAL_TABLET | Freq: Every day | ORAL | 3 refills | Status: DC

## 2023-06-10 NOTE — Progress Notes (Signed)
Outpatient Endocrinology Note [place] Cannon Quinton, MD  06/10/23  Patient's Name: Tammie Robinson    DOB: [birth year]    MRN: [id number]  REASON OF VISIT: Follow-up for hypothyroidism  PCP: Allegra Grana, FNP  HISTORY OF PRESENT ILLNESS:   Tammie Robinson is a 57 y.o. old female with past medical history as listed below is presented for a follow up for postablative hypothyroidism /thyroid nodule.   Pertinent Thyroid History: - Patient had history of hyperthyroidism, diagnosed in 2022 was treated with methimazole initially 80 mg daily, and was gradually decreased, she had positive thyrotropin receptor antibody consistent with Graves' disease.  She was treated with RAI I-131 in December 2023 with 12.55 mCi.  Patient developed postablative hypothyroidism with TSH of 11 in March 2024 and started on levothyroxine 50 mcg daily. However in 5/24 her TSH was low and she is now taking 50 mcg alternating with 25 mcg.  # Thyroid nodule -Initially diagnosed in October 2022 and subsequently benign biopsy on Afirma testing in March 2023.  Ultrasound in October 2022 showed borderline thyromegaly with findings suggestive of multinodular goiter.  Nodule #1 measuring 3.8 cm.   Thyroid scan of 03/04/2022 showed the following : 24 hour I-123 uptake = 81.1% (normal 10-30%) Large cold RIGHT thyroid nodule    Homogeneous increased tracer uptake in the thyroid lobes bilaterally with markedly elevated 4 hour and 24 hour radio iodine uptakes.   Needle aspiration biopsy of right thyroid nodule was benign by FNA with Afirma in March 2024, initial cytology was AUS.   Interval history Patient has been taking levothyroxine 50 mcg and 25 mcg on alternate days.  She denies palpitation or heat intolerance.  However when she takes a full tablet of levothyroxine she feels increased tired that day.  She has occasional tiredness.  Body weight is about the same.  No redness or watering of the eyes.  No other complaints today.   Recent lab with low TSH and normal free T4 as follows.  She denies any neck compressive symptoms or dysphagia.    Latest Reference Range & Units 06/03/23 09:53  TSH 0.35 - 5.50 uIU/mL 0.14 (L)  T4,Free(Direct) 0.60 - 1.60 ng/dL 4.09  (L): Data is abnormally low  REVIEW OF SYSTEMS:  As per history of present illness.   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 Evette Cristal   COLONOSCOPY WITH PROPOFOL N/A 01/26/2016   Procedure: COLONOSCOPY WITH PROPOFOL through colostomy;  Surgeon: Midge Minium, MD;  Location: [facility];  Service: Endoscopy;  Laterality: N/A;  port-a-cath   COLONOSCOPY WITH PROPOFOL N/A 11/29/2019   Procedure: COLONOSCOPY WITH PROPOFOL;  Surgeon: Midge Minium, MD;  Location: [facility];  Service: Endoscopy;  Laterality: N/A;   POLYPECTOMY  01/26/2016   Procedure: POLYPECTOMY;  Surgeon: Midge Minium,  MD;  Location: [facility];  Service: Endoscopy;;   POLYPECTOMY N/A 11/29/2019   Procedure: POLYPECTOMY;  Surgeon: Midge Minium, MD;  Location: [facility];  Service: Endoscopy;  Laterality: N/A;   PORTA CATH REMOVAL N/A 01/28/2018   Procedure: PORTA CATH REMOVAL;  Surgeon: Annice Needy, MD;  Location: [facility] INVASIVE CV LAB;  Service: Cardiovascular;  Laterality: N/A;   PORTACATH PLACEMENT     PROCTECTOMY  04/12/14   [facility] - CA surgery - with colostomy   SALPINGOOPHORECTOMY Bilateral 04/12/14   [facility] - with CA surg    VAGINA RECONSTRUCTION SURGERY  04/12/14   [facility] - transabdonimal rectus abdominus muscle reconstruction   VAGINAL DELIVERY     2, preterm labor    ALLERGIES: Allergies  Allergen Reactions   Lisinopril Hives and Rash    FAMILY HISTORY:  Family History  Problem Relation Age of Onset   Thyroid disease Mother    Arthritis Mother    Stroke Mother    Hypertension Mother    Diabetes Mother    Arthritis Father    Heart disease Father 82       s/p CABG   Stroke Father    Hypertension Father    Alcohol abuse Maternal Grandmother    Diabetes Maternal Grandmother    Alcohol abuse Maternal Grandfather    Diabetes Maternal Grandfather    Breast cancer Paternal Grandmother    Cancer Paternal Grandmother        breast   Heart disease Paternal Grandfather    Breast cancer Paternal Aunt 62   Colon cancer Neg Hx     SOCIAL HISTORY: Social History   Socioeconomic History   Marital status: Married    Spouse name: Not on file   Number of children: Not on file   Years of education: Not on file   Highest education level: 12th grade  Occupational History   Not on file  Tobacco Use   Smoking status: Never   Smokeless tobacco: Never  Vaping Use   Vaping status: Never Used  Substance and Sexual Activity   Alcohol use: No    Comment: rare - holidays   Drug use: No   Sexual activity: Not on file  Other Topics Concern   Not on file  Social History Narrative   Lives in [place] with husband, has 2 children, 1 in college at [place].      One daughter with leukemia, in remission   Other daughter has DM I.          Work - Advertising account executive   Diet - regular   Exercise - none recently   Social Determinants of Health   Financial Resource Strain: Patient Declined (11/21/2022)   Overall Financial Resource Strain (CARDIA)    Difficulty of Paying Living Expenses: Patient declined  Food Insecurity: Patient Declined (11/21/2022)   Hunger Vital Sign    Worried About Running Out of Food in  the Last Year: Patient declined    Ran Out of Food in the Last Year: Patient declined  Transportation Needs: No Transportation Needs (11/21/2022)   PRAPARE - Administrator, Civil Service (Medical): No    Lack of Transportation (Non-Medical): No  Physical Activity: Unknown (11/21/2022)   Exercise Vital Sign    Days of Exercise per Week: Patient declined    Minutes of Exercise per Session: Not on file  Stress: Stress Concern Present (11/21/2022)   [employer] of Occupational Health - Occupational Stress Questionnaire  Feeling of Stress : To some extent  Social Connections: Unknown (11/21/2022)   Social Connection and Isolation Panel [NHANES]    Frequency of Communication with Friends and Family: Patient declined    Frequency of Social Gatherings with Friends and Family: Patient declined    Attends Religious Services: Patient declined    Database administrator or Organizations: Patient declined    Attends Engineer, structural: Not on file    Marital Status: Married    MEDICATIONS:  Current Outpatient Medications  Medication Sig Dispense Refill   albuterol (PROVENTIL HFA;VENTOLIN HFA) 108 (90 Base) MCG/ACT inhaler Inhale 1-2 puffs into the lungs every 4 (four) hours as needed for wheezing or shortness of breath. 1 Inhaler 3   albuterol (VENTOLIN HFA) 108 (90 Base) MCG/ACT inhaler Inhale 2 puffs into the lungs every 6 (six) hours as needed for wheezing or shortness of breath. 8 g 0   amLODipine (NORVASC) 10 MG tablet TAKE (1) TABLET DAILY. 90 tablet 1   budesonide-formoterol (SYMBICORT) 160-4.5 MCG/ACT inhaler Inhale 2 puffs into the lungs 2 (two) times daily. 1 each 3   chlorpheniramine-HYDROcodone (TUSSIONEX) 10-8 MG/5ML Take 5 mLs by mouth at bedtime as needed for cough. 50 mL 0   escitalopram (LEXAPRO) 20 MG tablet Take 1 tablet (20 mg total) by mouth daily. 90 tablet 3   fluticasone (FLONASE) 50 MCG/ACT nasal spray Place 2 sprays into both nostrils daily. 16 g 6    gabapentin (NEURONTIN) 300 MG capsule TAKE TWO CAPSULES BY MOUTH EVERY MORNING, ONE CAPSULE midday AND TWO CAPSULES EVERY EVENING 300 capsule 6   losartan (COZAAR) 50 MG tablet Take 1 tablet (50 mg total) by mouth daily. 90 tablet 3   triamcinolone cream (KENALOG) 0.1 % Apply 1 application topically 4 (four) times daily as needed. 80 g 1   zolpidem (AMBIEN) 10 MG tablet Take 1 tablet (10 mg total) by mouth at bedtime as needed. for sleep 30 tablet 2   levothyroxine (SYNTHROID) 25 MCG tablet Take 1 tablet (25 mcg total) by mouth daily. 90 tablet 3   No current facility-administered medications for this visit.   Facility-Administered Medications Ordered in Other Visits  Medication Dose Route Frequency Provider Last Rate Last Admin   heparin lock flush 100 unit/mL  500 Units Intravenous Once Janese Banks, MD       sodium chloride 0.9 % injection 10 mL  10 mL Intravenous PRN Janese Banks, MD       sodium chloride 0.9 % injection 10 mL  10 mL Intravenous PRN Janese Banks, MD   10 mL at 11/28/14 1120   sodium chloride flush (NS) 0.9 % injection 10 mL  10 mL Intravenous PRN Loann Quill, NP   10 mL at 01/03/16 1413   sodium chloride flush (NS) 0.9 % injection 10 mL  10 mL Intravenous PRN Jeralyn Ruths, MD   10 mL at 11/15/16 1111   sodium chloride flush (NS) 0.9 % injection 10 mL  10 mL Intravenous PRN Earna Coder, MD   10 mL at 11/24/17 1434    PHYSICAL EXAM: Vitals:   06/10/23 1309 06/10/23 1310  BP: (!) 144/90 (!) 142/90  Pulse: 65   Resp: 20   SpO2: 97%   Weight: 255 lb 12.8 oz (116 kg)   Height: 5\' 5"  (1.651 m)    Body mass index is 42.57 kg/m.  Wt Readings from Last 3 Encounters:  06/10/23 255 lb 12.8 oz (116 kg)  01/28/23 252  lb 6.4 oz (114.5 kg)  12/04/22 254 lb 3.2 oz (115.3 kg)    General: Well developed, well nourished female in no apparent distress.  HEENT: AT/[place], no external lesions. Hearing intact to the spoken word Eyes: EOMI. No stare,  proptosis or lid lag. Conjunctiva clear and no icterus. Neck: Trachea midline, neck supple without appreciable thyromegaly or lymphadenopathy and right palpable thyroid nodule +, mobile, non tender. Abdomen: Soft, non tender, non distended Neurologic: Alert, oriented, normal speech, deep tendon biceps reflexes normal,  no gross focal neurological deficit Extremities: No pedal pitting edema, no tremors of outstretched hands Skin: Warm, color good.  Psychiatric: Does not appear depressed or anxious  PERTINENT HISTORIC LABORATORY AND IMAGING STUDIES:  All pertinent laboratory results were reviewed. Please see HPI also for further details.   TSH  Date Value Ref Range Status  06/03/2023 0.14 (L) 0.35 - 5.50 uIU/mL Final  03/12/2023 2.19 0.35 - 5.50 uIU/mL Final  02/21/2023 0.27 (L) 0.35 - 5.50 uIU/mL Final     ASSESSMENT / PLAN  1. Postablative hypothyroidism   2. Right thyroid nodule   3. H/O Graves' disease    -Patient has postablative hypothyroidism treated with RAI I-131 ablation for Graves' disease in December 2022. -She is currently taking levothyroxine 50 mcg and 25 mcg alternate days, in average taking 37.5 mcg daily. -Recent TSH is low. -Right thyroid nodule: Called nodule on nuclear scan status post FNA with AUS and Afirma benign in March 2024.  Last ultrasound in December 2022.  Plan: -Decrease levothyroxine to 25 mcg daily. -Check thyroid function test prior to follow-up visit in 3 months. -Will check ultrasound thyroid in 1 to 2 years to monitor right thyroid nodule.   Diagnoses and all orders for this visit:  Postablative hypothyroidism -     T4, free -     TSH  Right thyroid nodule  H/O Graves' disease  Other orders -     levothyroxine (SYNTHROID) 25 MCG tablet; Take 1 tablet (25 mcg total) by mouth daily.    DISPOSITION Follow up in clinic in 3 months suggested.  All questions answered and patient verbalized understanding of the plan.  [place]ra[place] Less Woolsey,  MD [facility] Endocrin[facility] [address] [place], [address] [place], [place] [address] Phone # [phone]  At least part of this note was generated using voice recognition software. Inadvertent word errors may have occurred, which were not recognized during the proofreading process.

## 2023-06-10 NOTE — Patient Instructions (Signed)
Decrease to levothyroxine 25 mcg daily.

## 2023-07-01 ENCOUNTER — Ambulatory Visit: Payer: 59 | Admitting: Family

## 2023-07-01 ENCOUNTER — Encounter: Payer: Self-pay | Admitting: Family

## 2023-07-01 VITALS — BP 136/78 | HR 77 | Temp 98.1°F | Ht 65.0 in | Wt 256.6 lb

## 2023-07-01 DIAGNOSIS — C20 Malignant neoplasm of rectum: Secondary | ICD-10-CM | POA: Diagnosis not present

## 2023-07-01 DIAGNOSIS — I10 Essential (primary) hypertension: Secondary | ICD-10-CM

## 2023-07-01 DIAGNOSIS — Z23 Encounter for immunization: Secondary | ICD-10-CM | POA: Diagnosis not present

## 2023-07-01 DIAGNOSIS — R1902 Left upper quadrant abdominal swelling, mass and lump: Secondary | ICD-10-CM

## 2023-07-01 DIAGNOSIS — R1900 Intra-abdominal and pelvic swelling, mass and lump, unspecified site: Secondary | ICD-10-CM | POA: Insufficient documentation

## 2023-07-01 DIAGNOSIS — G4700 Insomnia, unspecified: Secondary | ICD-10-CM | POA: Diagnosis not present

## 2023-07-01 DIAGNOSIS — R222 Localized swelling, mass and lump, trunk: Secondary | ICD-10-CM | POA: Insufficient documentation

## 2023-07-01 MED ORDER — LOSARTAN POTASSIUM 50 MG PO TABS: 75.0000 mg | ORAL_TABLET | Freq: Every day | ORAL | 3 refills | Status: DC

## 2023-07-01 NOTE — Assessment & Plan Note (Signed)
Chronic, stable.  Continue Ambien 10 mg qpm.  

## 2023-07-01 NOTE — Assessment & Plan Note (Addendum)
Benign exam today. presentation c/w lipoma.  Of note, patient does have history of rectal cancer, status post abdominoperineal resection 2015 and known h/o right abdominal wall laxity seen 07/19/23. No hernia seen on CT at that time.    Pending [place]. Consider ct a/p.

## 2023-07-01 NOTE — Progress Notes (Signed)
Assessment & Plan:  Left upper quadrant abdominal mass Assessment & Plan: Benign exam today. presentation c/w lipoma.  Of note, patient does have history of rectal cancer, status post abdominoperineal resection 2015 and known h/o right abdominal wall laxity seen 07/19/23. No hernia seen on CT at that time.    Pending [place]. Consider ct a/p.   Orders: -     US Abdomen Limited; Future  Primary hypertension -     Losartan Potassium; Take 1.5 tablets (75 mg total) by mouth daily.  Dispense: 135 tablet; Refill: 3 -     Comprehensive metabolic panel; Future  Rectal cancer (HCC) -     CEA; Future  Need for influenza vaccination -     Flu vaccine trivalent PF, 6mos and older(Flulaval,Afluria,Fluarix,Fluzone)  Insomnia, unspecified type Assessment & Plan: Chronic, stable.  Continue Ambien 10 mg qpm.       Return precautions given.   Risks, benefits, and alternatives of the medications and treatment plan prescribed today were discussed, and patient expressed understanding.   Education regarding symptom management and diagnosis given to patient on AVS either electronically or printed.  Return in about 3 months (around 09/29/2023).  Rennie Plowman, FNP  Subjective:    Patient ID: Tammie Robinson, female    DOB: [birth year], 57 y.o.   MRN: [id number]  CC: Tammie Robinson is a 57 y.o. female who presents today for follow up.   HPI: Complains of left upper abdominal 'ball', noticed 4 weeks ago Concern for hernia and she noticed after Thanksgiving after bending over young grandson, she had more pain , left side of abdomen.   No pain today.  No numbness , back pain, trouble urinating, rash, constipation.       H/o rectal cancer  Abdominoperineal resection 2015;   Following with Dr Erroll Luna, endocrinology; decreased levothyroxine to 25 mcg daily   She is doing well on Ambien 10 mg daily Allergies: Lisinopril Current Outpatient Medications on File Prior to Visit  Medication  Sig Dispense Refill   albuterol (PROVENTIL HFA;VENTOLIN HFA) 108 (90 Base) MCG/ACT inhaler Inhale 1-2 puffs into the lungs every 4 (four) hours as needed for wheezing or shortness of breath. 1 Inhaler 3   albuterol (VENTOLIN HFA) 108 (90 Base) MCG/ACT inhaler Inhale 2 puffs into the lungs every 6 (six) hours as needed for wheezing or shortness of breath. 8 g 0   amLODipine (NORVASC) 10 MG tablet TAKE (1) TABLET DAILY. 90 tablet 1   budesonide-formoterol (SYMBICORT) 160-4.5 MCG/ACT inhaler Inhale 2 puffs into the lungs 2 (two) times daily. 1 each 3   chlorpheniramine-HYDROcodone (TUSSIONEX) 10-8 MG/5ML Take 5 mLs by mouth at bedtime as needed for cough. 50 mL 0   escitalopram (LEXAPRO) 20 MG tablet Take 1 tablet (20 mg total) by mouth daily. 90 tablet 3   fluticasone (FLONASE) 50 MCG/ACT nasal spray Place 2 sprays into both nostrils daily. 16 g 6   gabapentin (NEURONTIN) 300 MG capsule TAKE TWO CAPSULES BY MOUTH EVERY MORNING, ONE CAPSULE midday AND TWO CAPSULES EVERY EVENING 300 capsule 6   levothyroxine (SYNTHROID) 25 MCG tablet Take 1 tablet (25 mcg total) by mouth daily. 90 tablet 3   triamcinolone cream (KENALOG) 0.1 % Apply 1 application topically 4 (four) times daily as needed. 80 g 1   zolpidem (AMBIEN) 10 MG tablet Take 1 tablet (10 mg total) by mouth at bedtime as needed. for sleep 30 tablet 2   Current Facility-Administered Medications on File Prior to Visit  Medication Dose Route Frequency Provider Last Rate Last Admin   heparin lock flush 100 unit/mL  500 Units Intravenous Once Janese Banks, MD       sodium chloride 0.9 % injection 10 mL  10 mL Intravenous PRN Janese Banks, MD       sodium chloride 0.9 % injection 10 mL  10 mL Intravenous PRN Janese Banks, MD   10 mL at 11/28/14 1120   sodium chloride flush (NS) 0.9 % injection 10 mL  10 mL Intravenous PRN Loann Quill, NP   10 mL at 01/03/16 1413   sodium chloride flush (NS) 0.9 % injection 10 mL  10 mL Intravenous PRN  Jeralyn Ruths, MD   10 mL at 11/15/16 1111   sodium chloride flush (NS) 0.9 % injection 10 mL  10 mL Intravenous PRN Earna Coder, MD   10 mL at 11/24/17 1434    Review of Systems  Constitutional:  Negative for chills and fever.  Respiratory:  Negative for cough.   Cardiovascular:  Negative for chest pain and palpitations.  Gastrointestinal:  Negative for abdominal distention, abdominal pain, nausea and vomiting.      Objective:    BP 136/78   Pulse 77   Temp 98.1 F (36.7 C) (Oral)   Ht 5\' 5"  (1.651 m)   Wt 256 lb 9.6 oz (116.4 kg)   LMP 12/11/2013   SpO2 97%   BMI 42.70 kg/m  BP Readings from Last 3 Encounters:  07/01/23 136/78  06/10/23 (!) 142/90  01/28/23 130/70   Wt Readings from Last 3 Encounters:  07/01/23 256 lb 9.6 oz (116.4 kg)  06/10/23 255 lb 12.8 oz (116 kg)  01/28/23 252 lb 6.4 oz (114.5 kg)    Physical Exam Vitals reviewed.  Constitutional:      Appearance: Normal appearance. She is well-developed.  Eyes:     Conjunctiva/sclera: Conjunctivae normal.  Cardiovascular:     Rate and Rhythm: Normal rate and regular rhythm.     Pulses: Normal pulses.     Heart sounds: Normal heart sounds.  Pulmonary:     Effort: Pulmonary effort is normal.     Breath sounds: Normal breath sounds. No wheezing, rhonchi or rales.  Abdominal:     General: Bowel sounds are normal. There is no distension.     Palpations: Abdomen is soft. Abdomen is not rigid. There is no fluid wave or mass.     Tenderness: There is no abdominal tenderness. There is no guarding or rebound.       Comments: Soft nontender circumscribed mass left upper abdomen.  Nonfluctuant. Mass is not or prominent while changing positions   Skin:    General: Skin is warm and dry.  Neurological:     Mental Status: She is alert.  Psychiatric:        Speech: Speech normal.        Behavior: Behavior normal.        Thought Content: Thought content normal.

## 2023-07-01 NOTE — Assessment & Plan Note (Signed)
Uncontrolled.  Discussed goal consistently less than 130/80.  Increase losartan 75mg   daily.  Continue amlodipine 10 mg daily.  BMP in 1 week

## 2023-07-01 NOTE — Patient Instructions (Signed)
Increase losartan to 75 mg once daily.  Goal of blood pressure is consistently less than 130/80.  We are not achieving this goal, please let me know  I ordered an ultrasound of your abdomen to evaluate for possible lipoma  Let us know if you dont hear back within a week in regards to an appointment being scheduled.   So that you are aware, if you are [facility] MyChart user , please pay attention to your MyChart messages as you may receive a MyChart message with a phone number to call and schedule this test/appointment own your own from our referral coordinator. This is a new process so I do not want you to miss this message.  If you are not a MyChart user, you will receive a phone call.     Please download Myfitness Pal App ( basic version is free).   You may log every thing you eat for even 2-3 days to get a better of idea of total daily calories. To loose weight, we have to create caloric deficit to loose weight. The goal is 1-2 lbs per week of weight loss.   Excellent article below from [employer].   https://www.health.CriticalZ.it  Calorie counting made easy  Eat less, exercise more. If only it were that simple! As most dieters know, losing weight can be very challenging. As this report details, a range of influences can affect how people gain and lose weight. But a basic understanding of how to tip your energy balance in favor of weight loss is a good place to start.  Start by determining how many calories you should consume each day. To do so, you need to know how many calories you need to maintain your current weight. Doing this requires a few simple calculations.  First, multiply your current weight by 15 -- that's roughly the number of calories per pound of body weight needed to maintain your current weight if you are moderately active. Moderately active means getting at least 30 minutes of physical activity a day in the form of exercise  (walking at a brisk pace, climbing stairs, or active gardening). Let's say you're a woman who is 5 feet, 4 inches tall and weighs 155 pounds, and you need to lose about 15 pounds to put you in a healthy weight range. If you multiply 155 by 15, you will get 2,325, which is the number of calories per day that you need in order to maintain your current weight (weight-maintenance calories). To lose weight, you will need to get below that total.  For example, to lose 1 to 2 pounds a week -- a rate that experts consider safe -- your food consumption should provide 500 to 1,000 calories less than your total weight-maintenance calories. If you need 2,325 calories a day to maintain your current weight, reduce your daily calories to between 1,325 and 1,825. If you are sedentary, you will also need to build more activity into your day. In order to lose at least a pound a week, try to do at least 30 minutes of physical activity on most days, and reduce your daily calorie intake by at least 500 calories. However, calorie intake should not fall below 1,200 a day in women or 1,500 a day in men, except under the supervision of a health professional. Eating too few calories can endanger your health by depriving you of needed nutrients.  Meeting your calorie target How can you meet your daily calorie target? One approach is to add up  the number of calories per serving of all the foods that you eat, and then plan your menus accordingly. You can buy books that list calories per serving for many foods. In addition, the nutrition labels on all packaged foods and beverages provide calories per serving information. Make a point of reading the labels of the foods and drinks you use, noting the number of calories and the serving sizes. Many recipes published in cookbooks, newspapers, and magazines provide similar information.  If you hate counting calories, a different approach is to restrict how much and how often you eat, and to eat  meals that are low in calories. Dietary guidelines issued by the American Heart Association stress common sense in choosing your foods rather than focusing strictly on numbers, such as total calories or calories from fat. Whichever method you choose, research shows that a regular eating schedule -- with meals and snacks planned for certain times each day -- makes for the most successful approach. The same applies after you have lost weight and want to keep it off. Sticking with an eating schedule increases your chance of maintaining your new weight.    This is  Dr. Melina Schools  ( an amazing physician in my office!)  example of a  "Low GI"  Diet:  It will allow you to lose 4 to 8  lbs  per month if you follow it carefully.  Your goal with exercise is a minimum of 30 minutes of aerobic exercise 5 days per week (Walking does not count once it becomes easy!)    All of the foods can be found at grocery stores and in bulk at [employer].  The Atkins protein bars and shakes are available in more varieties at Target, WalMart and Lowe's Foods.     7 AM Breakfast:  Choose from the following:  Low carbohydrate Protein  Shakes (I recommend the  Premier Protein chocolate shakes,  EAS AdvantEdge "Carb Control" shakes  Or the Atkins shakes all are under 3 net carbs)     a scrambled egg/bacon/cheese burrito made with Mission's "carb balance" whole wheat tortilla  (about 10 net carbs )  Medical laboratory scientific officer (basically a quiche without the pastry crust) that is eaten cold and very convenient way to get your eggs.  8 carbs)  If you make your own protein shakes, avoid bananas and pineapple,  And use low carb greek yogurt or original /unsweetened almond or soy milk    Avoid cereal and bananas, oatmeal and cream of wheat and grits. They are loaded with carbohydrates!   10 AM: high protein snack:  Protein bar by Atkins (the snack size, under 200 cal, usually < 6 net carbs).    A stick of cheese:  Around 1  carb,  100 cal     Dannon Light n Fit [place] Yogurt  (80 cal, 8 carbs)  Other so called "protein bars" and Greek yogurts tend to be loaded with carbohydrates.  Remember, in food advertising, the word "energy" is synonymous for " carbohydrate."  Lunch:   A Sandwich using the bread choices listed, Can use any  Eggs,  lunchmeat, grilled meat or canned tuna), avocado, regular mayo/mustard  and cheese.  A Salad using blue cheese, ranch,  Goddess or vinagrette,  Avoid taco shells, croutons or "confetti" and no "candied nuts" but regular nuts OK.   No pretzels, nabs  or chips.  Pickles and miniature sweet peppers are a good low carb alternative that provide  a "crunch"  The bread is the only source of carbohydrate in a sandwich and  can be decreased by trying some of the attached alternatives to traditional loaf bread   Avoid "Low fat dressings, as well as Reyne Dumas and [address] dressings They are loaded with sugar!   3 PM/ Mid day  Snack:  Consider  1 ounce of  almonds, walnuts, pistachios, pecans, peanuts,  Macadamia nuts or a nut medley.  Avoid "granola and granola bars "  Mixed nuts are ok in moderation as long as there are no raisins,  cranberries or dried fruit.   KIND bars are OK if you get the low glycemic index variety   Try the prosciutto/mozzarella cheese sticks by Fiorruci  In deli /backery section   High protein      6 PM  Dinner:     Meat/fowl/fish with a green salad, and either broccoli, cauliflower, green beans, spinach, brussel sprouts or  Lima beans. DO NOT BREAD THE PROTEIN!!      There is a low carb pasta by Dreamfield's that is acceptable and tastes great: only 5 digestible carbs/serving.( All grocery stores but BJs carry it ) Several ready made meals are available low carb:   Try Michel Angelo's chicken piccata or chicken or eggplant parm over low carb pasta.(Lowes and BJs)   Clifton Custard Sanchez's "Carnitas" (pulled pork, no sauce,  0 carbs) or his beef pot roast to make a  dinner burrito (at BJ's)  Pesto over low carb pasta (bj's sells a good quality pesto in the center refrigerated section of the deli   Try satueeing  Roosvelt Harps with mushroooms as a good side   [employer] Giant makes a mashed cauliflower that tastes like mashed potatoes  Whole wheat pasta is still full of digestible carbs and  Not as low in glycemic index as Dreamfield's.   Brown rice is still rice,  So skip the rice and noodles if you eat [place] or [place] (or at least limit to 1/2 cup)  9 PM snack :   Breyer's "low carb" fudgsicle or  ice cream bar (Carb Smart line), or  Weight Watcher's ice cream bar , or another "no sugar added" ice cream;  a serving of fresh berries/cherries with whipped cream   Cheese or DANNON'S LlGHT N FIT GREEK YOGURT  8 ounces of [place] unsweetened almond/cococunut milk    Treat yourself to a parfait made with whipped cream blueberiies, walnuts and vanilla greek yogurt  Avoid bananas, pineapple, grapes  and watermelon on a regular basis because they are high in sugar.  THINK OF THEM AS DESSERT  Remember that snack Substitutions should be less than 10 NET carbs per serving and meals < 20 carbs. Remember to subtract fiber grams to get the "net carbs."

## 2023-07-07 ENCOUNTER — Encounter: Payer: Self-pay | Admitting: Family

## 2023-07-07 ENCOUNTER — Ambulatory Visit
Admission: RE | Admit: 2023-07-07 | Discharge: 2023-07-07 | Disposition: A | Discharge status: Home / Self Care | Payer: 59 | Source: Ambulatory Visit | Attending: Family | Admitting: Family

## 2023-07-07 DIAGNOSIS — R222 Localized swelling, mass and lump, trunk: Secondary | ICD-10-CM | POA: Diagnosis not present

## 2023-07-07 DIAGNOSIS — R1902 Left upper quadrant abdominal swelling, mass and lump: Secondary | ICD-10-CM | POA: Diagnosis not present

## 2023-07-09 ENCOUNTER — Other Ambulatory Visit: Payer: Self-pay | Admitting: Family

## 2023-07-09 ENCOUNTER — Other Ambulatory Visit (INDEPENDENT_AMBULATORY_CARE_PROVIDER_SITE_OTHER): Payer: 59

## 2023-07-09 DIAGNOSIS — I10 Essential (primary) hypertension: Secondary | ICD-10-CM

## 2023-07-09 DIAGNOSIS — C20 Malignant neoplasm of rectum: Secondary | ICD-10-CM | POA: Diagnosis not present

## 2023-07-09 DIAGNOSIS — R1902 Left upper quadrant abdominal swelling, mass and lump: Secondary | ICD-10-CM

## 2023-07-09 LAB — COMPREHENSIVE METABOLIC PANEL
ALT: 40 U/L — ABNORMAL HIGH (ref 0–35)
AST: 29 U/L (ref 0–37)
Albumin: 3.9 g/dL (ref 3.5–5.2)
Alkaline Phosphatase: 88 U/L (ref 39–117)
BUN: 16 mg/dL (ref 6–23)
CO2: 30 meq/L (ref 19–32)
Calcium: 9.2 mg/dL (ref 8.4–10.5)
Chloride: 102 meq/L (ref 96–112)
Creatinine, Ser: 0.92 mg/dL (ref 0.40–1.20)
GFR: 68.86 mL/min (ref >60.00)
Glucose, Bld: 103 mg/dL — ABNORMAL HIGH (ref 70–99)
Potassium: 4.7 meq/L (ref 3.5–5.1)
Sodium: 137 meq/L (ref 135–145)
Total Bilirubin: 0.8 mg/dL (ref 0.2–1.2)
Total Protein: 7.4 g/dL (ref 6.0–8.3)

## 2023-07-10 LAB — CEA: CEA: <2 ng/mL

## 2023-07-11 ENCOUNTER — Telehealth: Payer: Self-pay | Admitting: Family

## 2023-07-11 NOTE — Telephone Encounter (Signed)
Tammie Robinson,  [employer] member additional information request in regards to CT abdomen pelvis.    Requesting provider refer her to her preferred site.  Is there an issue with where this is scheduled?  Can  you help rectify?

## 2023-07-14 ENCOUNTER — Encounter: Payer: Self-pay | Admitting: Family

## 2023-07-14 ENCOUNTER — Telehealth: Payer: Self-pay | Admitting: Family

## 2023-07-14 NOTE — Addendum Note (Signed)
Addended by: [place], Samad Thon on: 07/14/2023 12:03 PM   Modules accepted: Orders

## 2023-07-14 NOTE — Telephone Encounter (Signed)
Copied from CRM [id number]. Topic: General - Other >> Jul 14, 2023  1:57 PM Orinda Kenner C wrote: Reason for CRM: Bethann Berkshire from [facility] [phone] ext [phone] schedule 07/18/23 CT Scan abd, they additional information medical request for the scan auth to be approved; have to go to [facility].

## 2023-07-14 NOTE — Telephone Encounter (Signed)
Called back to [facility] and Spoke to Lyon from [employer] [phone] ext [phone] schedule 07/18/23 CT Scan abd, they additional information medical request for the scan auth to be approved; have to go to [facility]. Spoke to [place]  our referral coordinator afterwards and she stated that it was being handled she was awaiting approval.

## 2023-07-18 ENCOUNTER — Ambulatory Visit: Admission: RE | Admit: 2023-07-18 | Payer: 59 | Source: Ambulatory Visit

## 2023-07-18 ENCOUNTER — Ambulatory Visit
Admission: RE | Admit: 2023-07-18 | Discharge: 2023-07-18 | Disposition: A | Discharge status: Home / Self Care | Payer: 59 | Source: Ambulatory Visit | Attending: Family | Admitting: Family

## 2023-07-18 DIAGNOSIS — R1909 Other intra-abdominal and pelvic swelling, mass and lump: Secondary | ICD-10-CM | POA: Diagnosis not present

## 2023-07-18 DIAGNOSIS — R109 Unspecified abdominal pain: Secondary | ICD-10-CM | POA: Diagnosis not present

## 2023-07-18 DIAGNOSIS — Z933 Colostomy status: Secondary | ICD-10-CM | POA: Diagnosis not present

## 2023-07-18 DIAGNOSIS — K760 Fatty (change of) liver, not elsewhere classified: Secondary | ICD-10-CM | POA: Diagnosis not present

## 2023-07-18 DIAGNOSIS — R1902 Left upper quadrant abdominal swelling, mass and lump: Secondary | ICD-10-CM

## 2023-07-18 MED ORDER — IOPAMIDOL (ISOVUE-300) INJECTION 61%
100.0000 mL | Freq: Once | INTRAVENOUS | Status: AC | PRN
  Administered 2023-07-18: 100 mL via INTRAVENOUS

## 2023-07-25 ENCOUNTER — Encounter: Payer: Self-pay | Admitting: Family

## 2023-07-28 ENCOUNTER — Encounter: Payer: Self-pay | Admitting: Family

## 2023-07-28 DIAGNOSIS — K760 Fatty (change of) liver, not elsewhere classified: Secondary | ICD-10-CM | POA: Insufficient documentation

## 2023-08-18 ENCOUNTER — Other Ambulatory Visit: Payer: 59

## 2023-08-20 ENCOUNTER — Other Ambulatory Visit (INDEPENDENT_AMBULATORY_CARE_PROVIDER_SITE_OTHER): Payer: 59

## 2023-08-20 DIAGNOSIS — I10 Essential (primary) hypertension: Secondary | ICD-10-CM

## 2023-08-20 LAB — COMPREHENSIVE METABOLIC PANEL WITH GFR
ALT: 28 U/L (ref 0–35)
AST: 23 U/L (ref 0–37)
Albumin: 4.1 g/dL (ref 3.5–5.2)
Alkaline Phosphatase: 88 U/L (ref 39–117)
BUN: 12 mg/dL (ref 6–23)
CO2: 30 meq/L (ref 19–32)
Calcium: 9 mg/dL (ref 8.4–10.5)
Chloride: 103 meq/L (ref 96–112)
Creatinine, Ser: 1.13 mg/dL (ref 0.40–1.20)
GFR: 53.76 mL/min — ABNORMAL LOW
Glucose, Bld: 99 mg/dL (ref 70–99)
Potassium: 3.9 meq/L (ref 3.5–5.1)
Sodium: 141 meq/L (ref 135–145)
Total Bilirubin: 0.5 mg/dL (ref 0.2–1.2)
Total Protein: 7.2 g/dL (ref 6.0–8.3)

## 2023-08-21 ENCOUNTER — Encounter: Payer: Self-pay | Admitting: Family

## 2023-08-22 NOTE — Addendum Note (Signed)
Addended by: [place], Ina Scrivens on: 08/22/2023 12:20 PM   Modules accepted: Orders

## 2023-08-26 DIAGNOSIS — I361 Nonrheumatic tricuspid (valve) insufficiency: Secondary | ICD-10-CM | POA: Diagnosis not present

## 2023-08-26 DIAGNOSIS — I10 Essential (primary) hypertension: Secondary | ICD-10-CM | POA: Diagnosis not present

## 2023-08-26 DIAGNOSIS — Z8249 Family history of ischemic heart disease and other diseases of the circulatory system: Secondary | ICD-10-CM | POA: Diagnosis not present

## 2023-08-26 DIAGNOSIS — Z6841 Body Mass Index (BMI) 40.0 and over, adult: Secondary | ICD-10-CM | POA: Diagnosis not present

## 2023-08-26 DIAGNOSIS — E66813 Obesity, class 3: Secondary | ICD-10-CM | POA: Diagnosis not present

## 2023-08-27 ENCOUNTER — Other Ambulatory Visit: Payer: Self-pay | Admitting: Physician Assistant

## 2023-08-27 DIAGNOSIS — Z8249 Family history of ischemic heart disease and other diseases of the circulatory system: Secondary | ICD-10-CM

## 2023-08-27 DIAGNOSIS — E66813 Obesity, class 3: Secondary | ICD-10-CM

## 2023-08-27 DIAGNOSIS — I10 Essential (primary) hypertension: Secondary | ICD-10-CM

## 2023-08-28 ENCOUNTER — Other Ambulatory Visit: Payer: Self-pay

## 2023-08-28 ENCOUNTER — Other Ambulatory Visit: Payer: Self-pay | Admitting: Family

## 2023-08-28 ENCOUNTER — Telehealth: Payer: Self-pay

## 2023-08-28 DIAGNOSIS — G4700 Insomnia, unspecified: Secondary | ICD-10-CM

## 2023-08-28 MED ORDER — ZOLPIDEM TARTRATE 10 MG PO TABS: 10.0000 mg | ORAL_TABLET | Freq: Every evening | ORAL | 2 refills | Status: DC | PRN

## 2023-08-28 NOTE — Telephone Encounter (Signed)
 Called [facility] and spoke to Page and gave verbal otp to refill Ambien  10 mg . Take 1 tablet by mouth at bedtime

## 2023-09-03 ENCOUNTER — Other Ambulatory Visit: Payer: 59

## 2023-09-03 ENCOUNTER — Other Ambulatory Visit: Payer: Self-pay

## 2023-09-08 ENCOUNTER — Other Ambulatory Visit: Payer: 59

## 2023-09-08 ENCOUNTER — Encounter: Payer: Self-pay | Admitting: Endocrinology

## 2023-09-08 DIAGNOSIS — E890 Postprocedural hypothyroidism: Secondary | ICD-10-CM | POA: Diagnosis not present

## 2023-09-08 LAB — TSH: TSH: 7.66 m[IU]/L — ABNORMAL HIGH (ref 0.40–4.50)

## 2023-09-08 LAB — T4, FREE: Free T4: 0.8 ng/dL (ref 0.8–1.8)

## 2023-09-11 ENCOUNTER — Encounter: Payer: Self-pay | Admitting: Endocrinology

## 2023-09-11 ENCOUNTER — Telehealth (INDEPENDENT_AMBULATORY_CARE_PROVIDER_SITE_OTHER): Payer: 59 | Admitting: Endocrinology

## 2023-09-11 DIAGNOSIS — E041 Nontoxic single thyroid nodule: Secondary | ICD-10-CM

## 2023-09-11 DIAGNOSIS — Z8639 Personal history of other endocrine, nutritional and metabolic disease: Secondary | ICD-10-CM

## 2023-09-11 DIAGNOSIS — E890 Postprocedural hypothyroidism: Secondary | ICD-10-CM

## 2023-09-11 MED ORDER — LEVOTHYROXINE SODIUM 25 MCG PO TABS: 37.5000 ug | ORAL_TABLET | Freq: Every day | ORAL | 3 refills | Status: DC

## 2023-09-11 NOTE — Progress Notes (Signed)
Outpatient Endocrinology Note [place] Annalina Needles, MD  09/11/23  Patient's Name: Tammie Robinson    DOB: 1965/07/27    MRN: [id number]  I connected with  Tammie Robinson on 09/11/23 by a video enabled telemedicine application and verified that I am speaking with the correct person using two identifiers.   I discussed the limitations of evaluation and management by telemedicine. The patient expressed understanding and agreed to proceed.   Provider location : Office Patient location : At home Reason for virtual visit : Weather/winter storm  REASON OF VISIT: Follow-up for hypothyroidism  PCP: Allegra Grana, FNP  HISTORY OF PRESENT ILLNESS:   Tammie Robinson is a 58 y.o. old female with past medical history as listed below is presented for a follow up for postablative hypothyroidism / thyroid nodule.   Pertinent Thyroid History: - Patient had history of hyperthyroidism, diagnosed in 2022 was treated with methimazole initially 80 mg daily, and was gradually decreased, she had positive thyrotropin receptor antibody consistent with Graves' disease.  She was treated with RAI I-131 in December 2023 with 12.55 mCi.  Patient developed postablative hypothyroidism with TSH of 11 in March 2024 and started on levothyroxine 50 mcg daily. However in 5/24 her TSH was low and she was taking 50 mcg alternating with 25 mcg, decreased to 25 mcg daily in November 2024.  # Thyroid nodule -Initially diagnosed in October 2022 and subsequently benign biopsy on Afirma testing in March 2023.  Ultrasound in October 2022 showed borderline thyromegaly with findings suggestive of multinodular goiter.  Nodule #1 measuring 3.8 cm.   Thyroid scan of 03/04/2022 showed the following : 24 hour I-123 uptake = 81.1% (normal 10-30%) Large cold RIGHT thyroid nodule    Homogeneous increased tracer uptake in the thyroid lobes bilaterally with markedly elevated 4 hour and 24 hour radio iodine uptakes.   Needle aspiration biopsy of  right thyroid nodule was benign by FNA with Afirma in March 2024, initial cytology was AUS.   Interval history Patient has been taking levothyroxine 25 mcg daily.  Recent thyroid labs showed elevated TSH with normal free T4 as follows.  Patient reports compliance and taking properly in the early morning before breakfast.  She has complaints of fatigue, muscle and joint ache.  No neck compressive symptoms.   Latest Reference Range & Units 09/08/23 08:19  TSH 0.40 - 4.50 mIU/L 7.66 (H)  T4,Free(Direct) 0.8 - 1.8 ng/dL 0.8  (H): Data is abnormally high  REVIEW OF SYSTEMS:  As per history of present illness.   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 Evette Cristal   COLONOSCOPY WITH PROPOFOL N/A 01/26/2016   Procedure: COLONOSCOPY WITH PROPOFOL through colostomy;  Surgeon: Midge Minium, MD;  Location: [facility];  Service: Endoscopy;  Laterality: N/A;  port-a-cath  COLONOSCOPY WITH PROPOFOL N/A 11/29/2019   Procedure: COLONOSCOPY WITH PROPOFOL;  Surgeon: Midge Minium, MD;  Location: [facility];  Service: Endoscopy;  Laterality: N/A;   POLYPECTOMY  01/26/2016   Procedure: POLYPECTOMY;  Surgeon: Midge Minium, MD;  Location: [facility];  Service: Endoscopy;;   POLYPECTOMY N/A 11/29/2019   Procedure: POLYPECTOMY;  Surgeon: Midge Minium, MD;  Location: [facility];  Service: Endoscopy;  Laterality: N/A;   PORTA CATH REMOVAL N/A 01/28/2018    Procedure: PORTA CATH REMOVAL;  Surgeon: Annice Needy, MD;  Location: [facility];  Service: Cardiovascular;  Laterality: N/A;   PORTACATH PLACEMENT     PROCTECTOMY  04/12/14   [facility] - CA surgery - with colostomy   SALPINGOOPHORECTOMY Bilateral 04/12/14   [facility] - with CA surg   VAGINA RECONSTRUCTION SURGERY  04/12/14   [facility] - transabdonimal rectus abdominus muscle reconstruction   VAGINAL DELIVERY     2, preterm labor    ALLERGIES: Allergies  Allergen Reactions   Lisinopril Hives and Rash    FAMILY HISTORY:  Family History  Problem Relation Age of Onset   Thyroid disease Mother    Arthritis Mother    Stroke Mother    Hypertension Mother    Diabetes Mother    Arthritis Father    Heart disease Father 59       s/p CABG   Stroke Father    Hypertension Father    Alcohol abuse Maternal Grandmother    Diabetes Maternal Grandmother    Alcohol abuse Maternal Grandfather    Diabetes Maternal Grandfather    Breast cancer Paternal Grandmother    Cancer Paternal Grandmother        breast   Heart disease Paternal Grandfather    Breast cancer Paternal Aunt 6   Colon cancer Neg Hx     SOCIAL HISTORY: Social History   Socioeconomic History   Marital status: Married    Spouse name: Not on file   Number of children: Not on file   Years of education: Not on file   Highest education level: 12th grade  Occupational History   Not on file  Tobacco Use   Smoking status: Never   Smokeless tobacco: Never  Vaping Use   Vaping status: Never Used  Substance and Sexual Activity   Alcohol use: No    Comment: rare - holidays   Drug use: No   Sexual activity: Not on file  Other Topics Concern   Not on file  Social History Narrative   Lives in [place] with husband, has 2 children, 1 in college at [employer].      One daughter with leukemia, in remission   Other daughter has DM I.          Work - Advertising account executive   Diet - regular   Exercise - none recently   Social  Drivers of Corporate investment banker Strain: Low Risk  (06/30/2023)   Overall Financial Resource Strain (CARDIA)    Difficulty of Paying Living Expenses: Not very hard  Food Insecurity: No Food Insecurity (06/30/2023)   Hunger Vital Sign    Worried About Running Out of Food in the Last Year: Never true    Ran Out of Food in the Last Year: Never true  Transportation Needs: No Transportation Needs (06/30/2023)   PRAPARE - Administrator, Civil Service (Medical): No    Lack of Transportation (Non-Medical): No  Physical Activity: Unknown (06/30/2023)  Exercise Vital Sign    Days of Exercise per Week: Patient declined    Minutes of Exercise per Session: Not on file  Stress: Patient Declined (06/30/2023)   [employer] of Occupational Health - Occupational Stress Questionnaire    Feeling of Stress : Patient declined  Social Connections: Unknown (06/30/2023)   Social Connection and Isolation Panel [NHANES]    Frequency of Communication with Friends and Family: Patient declined    Frequency of Social Gatherings with Friends and Family: Patient declined    Attends Religious Services: Patient declined    Database administrator or Organizations: Patient declined    Attends Engineer, structural: Not on file    Marital Status: Married    MEDICATIONS:  Current Outpatient Medications  Medication Sig Dispense Refill   albuterol (PROVENTIL HFA;VENTOLIN HFA) 108 (90 Base) MCG/ACT inhaler Inhale 1-2 puffs into the lungs every 4 (four) hours as needed for wheezing or shortness of breath. 1 Inhaler 3   albuterol (VENTOLIN HFA) 108 (90 Base) MCG/ACT inhaler Inhale 2 puffs into the lungs every 6 (six) hours as needed for wheezing or shortness of breath. 8 g 0   amLODipine (NORVASC) 10 MG tablet TAKE (1) TABLET DAILY. 90 tablet 1   budesonide-formoterol (SYMBICORT) 160-4.5 MCG/ACT inhaler Inhale 2 puffs into the lungs 2 (two) times daily. 1 each 3   chlorpheniramine-HYDROcodone  (TUSSIONEX) 10-8 MG/5ML Take 5 mLs by mouth at bedtime as needed for cough. 50 mL 0   escitalopram (LEXAPRO) 20 MG tablet Take 1 tablet (20 mg total) by mouth daily. 90 tablet 3   fluticasone (FLONASE) 50 MCG/ACT nasal spray Place 2 sprays into both nostrils daily. 16 g 6   gabapentin (NEURONTIN) 300 MG capsule TAKE TWO CAPSULES BY MOUTH EVERY MORNING, ONE CAPSULE midday AND TWO CAPSULES EVERY EVENING 300 capsule 6   losartan (COZAAR) 50 MG tablet Take 1.5 tablets (75 mg total) by mouth daily. 135 tablet 3   triamcinolone cream (KENALOG) 0.1 % Apply 1 application topically 4 (four) times daily as needed. 80 g 1   zolpidem (AMBIEN) 10 MG tablet Take 1 tablet (10 mg total) by mouth at bedtime as needed. for sleep 30 tablet 2   levothyroxine (SYNTHROID) 25 MCG tablet Take 1.5 tablets (37.5 mcg total) by mouth daily. 135 tablet 3   No current facility-administered medications for this visit.   Facility-Administered Medications Ordered in Other Visits  Medication Dose Route Frequency Provider Last Rate Last Admin   heparin lock flush 100 unit/mL  500 Units Intravenous Once Janese Banks, MD       sodium chloride 0.9 % injection 10 mL  10 mL Intravenous PRN Janese Banks, MD       sodium chloride 0.9 % injection 10 mL  10 mL Intravenous PRN Janese Banks, MD   10 mL at 11/28/14 1120   sodium chloride flush (NS) 0.9 % injection 10 mL  10 mL Intravenous PRN Loann Quill, NP   10 mL at 01/03/16 1413   sodium chloride flush (NS) 0.9 % injection 10 mL  10 mL Intravenous PRN Jeralyn Ruths, MD   10 mL at 11/15/16 1111   sodium chloride flush (NS) 0.9 % injection 10 mL  10 mL Intravenous PRN Earna Coder, MD   10 mL at 11/24/17 1434    PHYSICAL EXAM: There were no vitals filed for this visit.  There is no height or weight on file to calculate BMI.  Wt Readings from Last  3 Encounters:  07/01/23 256 lb 9.6 oz (116.4 kg)  06/10/23 255 lb 12.8 oz (116 kg)  01/28/23 252 lb 6.4 oz  (114.5 kg)    General: Well developed, well nourished female in no apparent distress.  HEENT: AT/, no external lesions. Hearing intact to the spoken word Neurologic: Alert, oriented, normal speech Psychiatric: Does not appear depressed or anxious  PERTINENT HISTORIC LABORATORY AND IMAGING STUDIES:  All pertinent laboratory results were reviewed. Please see HPI also for further details.   TSH  Date Value Ref Range Status  09/08/2023 7.66 (H) 0.40 - 4.50 mIU/L Final  06/03/2023 0.14 (L) 0.35 - 5.50 uIU/mL Final  03/12/2023 2.19 0.35 - 5.50 uIU/mL Final     ASSESSMENT / PLAN  1. Postablative hypothyroidism   2. Right thyroid nodule   3. H/O Graves' disease     -Patient has postablative hypothyroidism treated with RAI I-131 ablation for Graves' disease in December 2023. -She is currently taking levothyroxine 25 mcg daily. -Right thyroid nodule: cold nodule on nuclear scan status post FNA with AUS and Afirma benign in March 2024.  Last ultrasound in December 2022.  Will check ultrasound thyroid in 1 to 2 years to monitor right thyroid nodule. -Recent lab with elevated TSH.  She had over replacement of thyroid hormone when taking levothyroxine 37.5 mcg in average daily.  Plan: -Increase levothyroxine from 25 mcg daily to 1 and half tablet, 37.5 mcg daily. -Check thyroid function test prior to follow-up visit in 2 months.   Diagnoses and all orders for this visit:  Postablative hypothyroidism -     levothyroxine (SYNTHROID) 25 MCG tablet; Take 1.5 tablets (37.5 mcg total) by mouth daily. -     T4, free -     TSH  Right thyroid nodule  H/O Graves' disease     DISPOSITION Follow up in clinic in 2 months suggested.  Labs prior to follow-up visit.  All questions answered and patient verbalized understanding of the plan.  [place] Herald Vallin, MD [facility] Endocrin[facility] [address] [place], [address] [place], [place] [address] Phone # [phone]  At least  part of this note was generated using voice recognition software. Inadvertent word errors may have occurred, which were not recognized during the proofreading process.   3

## 2023-09-16 ENCOUNTER — Ambulatory Visit
Admission: RE | Admit: 2023-09-16 | Discharge: 2023-09-16 | Disposition: A | Discharge status: Home / Self Care | Payer: Self-pay | Source: Ambulatory Visit | Attending: Physician Assistant | Admitting: Physician Assistant

## 2023-09-16 DIAGNOSIS — Z6841 Body Mass Index (BMI) 40.0 and over, adult: Secondary | ICD-10-CM | POA: Insufficient documentation

## 2023-09-16 DIAGNOSIS — E66813 Obesity, class 3: Secondary | ICD-10-CM | POA: Insufficient documentation

## 2023-09-16 DIAGNOSIS — I10 Essential (primary) hypertension: Secondary | ICD-10-CM | POA: Insufficient documentation

## 2023-09-16 DIAGNOSIS — Z8249 Family history of ischemic heart disease and other diseases of the circulatory system: Secondary | ICD-10-CM | POA: Insufficient documentation

## 2023-09-24 ENCOUNTER — Encounter: Payer: Self-pay | Admitting: Family

## 2023-09-25 ENCOUNTER — Telehealth: Payer: Self-pay | Admitting: Family

## 2023-09-25 ENCOUNTER — Other Ambulatory Visit: Payer: Self-pay | Admitting: Family

## 2023-09-25 DIAGNOSIS — L0390 Cellulitis, unspecified: Secondary | ICD-10-CM

## 2023-09-25 MED ORDER — DOXYCYCLINE HYCLATE 100 MG PO TABS: 100.0000 mg | ORAL_TABLET | Freq: Two times a day (BID) | ORAL | 0 refills | Status: AC

## 2023-09-25 NOTE — Telephone Encounter (Signed)
 noted

## 2023-09-25 NOTE — Telephone Encounter (Signed)
 Please call and triage pt See MyChart message in regards to concern for infection.  Please triage for fever, chills, abdominal pain I did send in doxycycline if concern for early infection however if this is more extensive or she is having systemic features, she needs to be seen.  Please schedule either in our office if available or advised to go to urgent care, emergency room.    She is appointment with me on Monday

## 2023-09-25 NOTE — Telephone Encounter (Signed)
 LVM to call back to triage pt per message below  Please call and triage pt See MyChart message in regards to concern for infection.  Please triage for fever, chills, abdominal pain I did send in doxycycline if concern for early infection however if this is more extensive or she is having systemic features, she needs to be seen.  Please schedule either in our office if available or advised to go to urgent care, emergency room.     She is appointment with me on Monday

## 2023-09-25 NOTE — Telephone Encounter (Signed)
 Spoke to pt in regards to message below pt stated that she is not having any fever or chills or abdominal pain, pt stated that she will pick up medication from pharmacy, and if she feels the need she will go to [facility] or ED if not she will see you on Monday  See MyChart message in regards to concern for infection.  Please triage for fever, chills, abdominal pain I did send in doxycycline if concern for early infection however if this is more extensive or she is having systemic features, she needs to be seen.  Please schedule either in our office if available or advised to go to urgent care, emergency room.     She is appointment with me on Monday

## 2023-09-29 ENCOUNTER — Telehealth: Payer: Self-pay | Admitting: Family

## 2023-09-29 ENCOUNTER — Ambulatory Visit: Payer: 59 | Admitting: Family

## 2023-09-29 VITALS — BP 132/80 | HR 70 | Temp 97.8°F | Ht 65.0 in | Wt 253.6 lb

## 2023-09-29 DIAGNOSIS — K760 Fatty (change of) liver, not elsewhere classified: Secondary | ICD-10-CM | POA: Diagnosis not present

## 2023-09-29 DIAGNOSIS — Z13220 Encounter for screening for lipoid disorders: Secondary | ICD-10-CM | POA: Diagnosis not present

## 2023-09-29 DIAGNOSIS — F32A Depression, unspecified: Secondary | ICD-10-CM | POA: Diagnosis not present

## 2023-09-29 DIAGNOSIS — Z136 Encounter for screening for cardiovascular disorders: Secondary | ICD-10-CM

## 2023-09-29 DIAGNOSIS — F419 Anxiety disorder, unspecified: Secondary | ICD-10-CM | POA: Diagnosis not present

## 2023-09-29 DIAGNOSIS — L0390 Cellulitis, unspecified: Secondary | ICD-10-CM

## 2023-09-29 DIAGNOSIS — Z8639 Personal history of other endocrine, nutritional and metabolic disease: Secondary | ICD-10-CM

## 2023-09-29 MED ORDER — AMOXICILLIN 875 MG PO TABS
875.0000 mg | ORAL_TABLET | Freq: Two times a day (BID) | ORAL | 0 refills | Status: AC
Start: 2023-09-29 — End: 2023-10-06

## 2023-09-29 MED ORDER — ESCITALOPRAM OXALATE 20 MG PO TABS: 20.0000 mg | ORAL_TABLET | Freq: Every day | ORAL | 3 refills | Status: DC

## 2023-09-29 NOTE — Patient Instructions (Addendum)
 Complete doxycycline  Start amoxicillin for MSSA coverage  Ensure to take probiotics while on antibiotics and also for 2 weeks after completion. This can either be by eating yogurt daily or taking a probiotic supplement over the counter such as Culturelle.It is important to re-colonize the gut with good bacteria and also to prevent any diarrheal infections associated with antibiotic use.    I have ordered ultrasound of liver.   Let us know if you dont hear back within a week in regards to an appointment being scheduled.   So that you are aware, if you are [facility] MyChart user , please pay attention to your MyChart messages as you may receive a MyChart message with a phone number to call and schedule this test/appointment own your own from our referral coordinator. This is a new process so I do not want you to miss this message.  If you are not a MyChart user, you will receive a phone call.

## 2023-09-29 NOTE — Assessment & Plan Note (Signed)
 No systemic features.  Concern for cellulitis.  Adding amoxicillin for MSSA coverage ; complete doxycycline. Close follow up.

## 2023-09-29 NOTE — Telephone Encounter (Signed)
 Lft pt vm to call ofc to sch [place]. thanks ?

## 2023-09-29 NOTE — Assessment & Plan Note (Signed)
 Pending [place], CBC with diff.

## 2023-09-29 NOTE — Progress Notes (Unsigned)
 Assessment & Plan:  There are no diagnoses linked to this encounter.   Return precautions given.   Risks, benefits, and alternatives of the medications and treatment plan prescribed today were discussed, and patient expressed understanding.   Education regarding symptom management and diagnosis given to patient on AVS either electronically or printed.  No follow-ups on file.  Rennie Plowman, FNP  Subjective:    Patient ID: Tammie Robinson, female    DOB: [birth year], 58 y.o.   MRN: [id number]  CC: Tammie Robinson is a 58 y.o. female who presents today for an acute visit.    HPI: Follow-up left-sided abdominal 'knot', onset 2 months ago She describes that the area can feel 'bigger and harder' Some days more tender than others. Certain positions such as leaning to left hand side, area will be more tender  No itching, purulent discharge, constipation, dysuria, fever, chills, rash.   She started doxycycline 4 days ago and skin is not as warm.     CT abdomen and pelvis 07/18/23 steatosis of liver ,   indurated fat in the left anterior abdominal wall at roughly the level of the spleen and upper to mid left kidney. Surrounding inflammatory reaction present as well as a central more low-attenuation region likely consistent with a small pocket of residual complex fluid measuring approximately 1.9 x 1.3 x 2.5 cm in greatest dimensions Findings c/w with resolving subcutaneous infection or inflammatory reaction around hematoma Laxity of right abdominal wall  Previously treated with keflex for presumed abdominal wall cellulitis 07/2018 with resolution .   Allergies: Lisinopril Current Outpatient Medications on File Prior to Visit  Medication Sig Dispense Refill   albuterol (PROVENTIL HFA;VENTOLIN HFA) 108 (90 Base) MCG/ACT inhaler Inhale 1-2 puffs into the lungs every 4 (four) hours as needed for wheezing or shortness of breath. 1 Inhaler 3   albuterol (VENTOLIN HFA) 108 (90 Base)  MCG/ACT inhaler Inhale 2 puffs into the lungs every 6 (six) hours as needed for wheezing or shortness of breath. 8 g 0   amLODipine (NORVASC) 10 MG tablet TAKE (1) TABLET DAILY. 90 tablet 1   budesonide-formoterol (SYMBICORT) 160-4.5 MCG/ACT inhaler Inhale 2 puffs into the lungs 2 (two) times daily. 1 each 3   doxycycline (VIBRA-TABS) 100 MG tablet Take 1 tablet (100 mg total) by mouth 2 (two) times daily for 7 days. 14 tablet 0   escitalopram (LEXAPRO) 20 MG tablet Take 1 tablet (20 mg total) by mouth daily. 90 tablet 3   fluticasone (FLONASE) 50 MCG/ACT nasal spray Place 2 sprays into both nostrils daily. 16 g 6   gabapentin (NEURONTIN) 300 MG capsule TAKE TWO CAPSULES BY MOUTH EVERY MORNING, ONE CAPSULE midday AND TWO CAPSULES EVERY EVENING 300 capsule 6   levothyroxine (SYNTHROID) 25 MCG tablet Take 1.5 tablets (37.5 mcg total) by mouth daily. 135 tablet 3   losartan (COZAAR) 50 MG tablet Take 1.5 tablets (75 mg total) by mouth daily. 135 tablet 3   triamcinolone cream (KENALOG) 0.1 % Apply 1 application topically 4 (four) times daily as needed. 80 g 1   zolpidem (AMBIEN) 10 MG tablet Take 1 tablet (10 mg total) by mouth at bedtime as needed. for sleep 30 tablet 2   chlorpheniramine-HYDROcodone (TUSSIONEX) 10-8 MG/5ML Take 5 mLs by mouth at bedtime as needed for cough. 50 mL 0   Current Facility-Administered Medications on File Prior to Visit  Medication Dose Route Frequency Provider Last Rate Last Admin   heparin lock flush 100 unit/mL  500 Units Intravenous Once Janese Banks, MD       sodium chloride 0.9 % injection 10 mL  10 mL Intravenous PRN Janese Banks, MD       sodium chloride 0.9 % injection 10 mL  10 mL Intravenous PRN Janese Banks, MD   10 mL at 11/28/14 1120   sodium chloride flush (NS) 0.9 % injection 10 mL  10 mL Intravenous PRN Loann Quill, NP   10 mL at 01/03/16 1413   sodium chloride flush (NS) 0.9 % injection 10 mL  10 mL Intravenous PRN Jeralyn Ruths, MD    10 mL at 11/15/16 1111   sodium chloride flush (NS) 0.9 % injection 10 mL  10 mL Intravenous PRN Earna Coder, MD   10 mL at 11/24/17 1434    Review of Systems  Constitutional:  Negative for chills and fever.  Respiratory:  Negative for cough.   Cardiovascular:  Negative for chest pain and palpitations.  Gastrointestinal:  Negative for nausea and vomiting.      Objective:    BP 132/80   Pulse 70   Temp 97.8 F (36.6 C) (Oral)   Ht 5\' 5"  (1.651 m)   Wt 253 lb 9.6 oz (115 kg)   LMP 12/11/2013   SpO2 97%   BMI 42.20 kg/m   BP Readings from Last 3 Encounters:  09/29/23 132/80  07/01/23 136/78  06/10/23 (!) 142/90   Wt Readings from Last 3 Encounters:  09/29/23 253 lb 9.6 oz (115 kg)  07/01/23 256 lb 9.6 oz (116.4 kg)  06/10/23 255 lb 12.8 oz (116 kg)    Physical Exam Vitals reviewed.  Constitutional:      Appearance: She is well-developed.  Eyes:     Conjunctiva/sclera: Conjunctivae normal.  Cardiovascular:     Rate and Rhythm: Normal rate and regular rhythm.     Pulses: Normal pulses.     Heart sounds: Normal heart sounds.  Pulmonary:     Effort: Pulmonary effort is normal.     Breath sounds: Normal breath sounds. No wheezing, rhonchi or rales.  Skin:    General: Skin is warm and dry.  Neurological:     Mental Status: She is alert.  Psychiatric:        Speech: Speech normal.        Behavior: Behavior normal.        Thought Content: Thought content normal.

## 2023-09-30 ENCOUNTER — Telehealth: Payer: Self-pay | Admitting: Family

## 2023-09-30 NOTE — Telephone Encounter (Signed)
 Lft pt vm to call ofc to sch [place]. thanks ?

## 2023-10-01 ENCOUNTER — Telehealth: Payer: Self-pay | Admitting: Family

## 2023-10-01 NOTE — Telephone Encounter (Signed)
 Lft pt vm to call ofc to sch [place]. thanks ?

## 2023-10-02 ENCOUNTER — Other Ambulatory Visit

## 2023-10-03 ENCOUNTER — Other Ambulatory Visit (INDEPENDENT_AMBULATORY_CARE_PROVIDER_SITE_OTHER)

## 2023-10-03 DIAGNOSIS — Z13220 Encounter for screening for lipoid disorders: Secondary | ICD-10-CM

## 2023-10-03 DIAGNOSIS — Z8639 Personal history of other endocrine, nutritional and metabolic disease: Secondary | ICD-10-CM

## 2023-10-03 DIAGNOSIS — Z136 Encounter for screening for cardiovascular disorders: Secondary | ICD-10-CM | POA: Diagnosis not present

## 2023-10-03 DIAGNOSIS — K760 Fatty (change of) liver, not elsewhere classified: Secondary | ICD-10-CM

## 2023-10-03 LAB — LIPID PANEL
Cholesterol: 132 mg/dL (ref 0–200)
HDL: 42.5 mg/dL (ref >39.00)
LDL Cholesterol: 70 mg/dL (ref 0–99)
NonHDL: 89.8
Total CHOL/HDL Ratio: 3
Triglycerides: 97 mg/dL (ref 0.0–149.0)
VLDL: 19.4 mg/dL (ref 0.0–40.0)

## 2023-10-03 LAB — CBC WITH DIFFERENTIAL/PLATELET
Basophils Absolute: 0.1 10*3/uL (ref 0.0–0.1)
Basophils Relative: 0.9 % (ref 0.0–3.0)
Eosinophils Absolute: 0.2 10*3/uL (ref 0.0–0.7)
Eosinophils Relative: 3.4 % (ref 0.0–5.0)
HCT: 38.9 % (ref 36.0–46.0)
Hemoglobin: 12.9 g/dL (ref 12.0–15.0)
Lymphocytes Relative: 20.6 % (ref 12.0–46.0)
Lymphs Abs: 1.4 10*3/uL (ref 0.7–4.0)
MCHC: 33.1 g/dL (ref 30.0–36.0)
MCV: 83.8 fl (ref 78.0–100.0)
Monocytes Absolute: 0.6 10*3/uL (ref 0.1–1.0)
Monocytes Relative: 8.1 % (ref 3.0–12.0)
Neutro Abs: 4.7 10*3/uL (ref 1.4–7.7)
Neutrophils Relative %: 67 % (ref 43.0–77.0)
Platelets: 210 10*3/uL (ref 150.0–400.0)
RBC: 4.64 Mil/uL (ref 3.87–5.11)
RDW: 14.1 % (ref 11.5–15.5)
WBC: 7 10*3/uL (ref 4.0–10.5)

## 2023-10-03 LAB — VITAMIN D 25 HYDROXY (VIT D DEFICIENCY, FRACTURES): VITD: 20.73 ng/mL — ABNORMAL LOW (ref 30.00–100.00)

## 2023-10-06 ENCOUNTER — Telehealth: Payer: Self-pay | Admitting: Family

## 2023-10-06 ENCOUNTER — Ambulatory Visit: Admitting: Family

## 2023-10-06 ENCOUNTER — Encounter: Payer: Self-pay | Admitting: Family

## 2023-10-06 VITALS — BP 134/76 | HR 65 | Temp 97.6°F | Ht 65.0 in | Wt 254.6 lb

## 2023-10-06 DIAGNOSIS — L0390 Cellulitis, unspecified: Secondary | ICD-10-CM

## 2023-10-06 DIAGNOSIS — Z1231 Encounter for screening mammogram for malignant neoplasm of breast: Secondary | ICD-10-CM

## 2023-10-06 DIAGNOSIS — K760 Fatty (change of) liver, not elsewhere classified: Secondary | ICD-10-CM | POA: Diagnosis not present

## 2023-10-06 NOTE — Telephone Encounter (Signed)
 Urgent referral to gen surgery  See note and picture in chart; concern for infection  Appt needed in the next 1-3 days  Let me know once scheduled

## 2023-10-06 NOTE — Patient Instructions (Signed)
 I have placed urgent referral to general surgery.  Please let me know if you do not hear from the office to schedule this appointment in the next 1 to 2 days.  Please let me know of any concern for progression of symptoms as we discussed.

## 2023-10-06 NOTE — Assessment & Plan Note (Addendum)
 Afebrile. Non toxic in appearance.  Concern for deep tissue infection abdominal wall; failed doxycycline, amoxicillin Pending urgent surgery consult

## 2023-10-06 NOTE — Assessment & Plan Note (Signed)
 Fibrosis 4 Score = 1.2 (Low risk)        Interpretation for patients with NAFLD          <1.30       -  F0-F1 (Low risk)          1.30-2.67 -  Indeterminate           >2.67      -  F3-F4 (High risk)     Validated for ages 17-65       Pending RUQ [place]. Discussed surveillance.

## 2023-10-06 NOTE — Progress Notes (Signed)
 Assessment & Plan:  Cellulitis, unspecified cellulitis site Assessment & Plan: Afebrile. Non toxic in appearance.  Concern for deep tissue infection abdominal wall; failed doxycycline, amoxicillin Pending urgent surgery consult    Orders: -     Ambulatory referral to General Surgery  Encounter for screening mammogram for malignant neoplasm of breast -     3D Screening Mammogram, Left and Right; Future  Hepatic steatosis Assessment & Plan:  Fibrosis 4 Score = 1.2 (Low risk)        Interpretation for patients with NAFLD          <1.30       -  F0-F1 (Low risk)          1.30-2.67 -  Indeterminate           >2.67      -  F3-F4 (High risk)     Validated for ages 6-65       Pending RUQ [place]. Discussed surveillance.       Return precautions given.   Risks, benefits, and alternatives of the medications and treatment plan prescribed today were discussed, and patient expressed understanding.   Education regarding symptom management and diagnosis given to patient on AVS either electronically or printed.  No follow-ups on file.  Rennie Plowman, FNP  Subjective:    Patient ID: Tammie Robinson, female    DOB: 06-19-1966, 58 y.o.   MRN: [id number]  CC: Tammie Robinson is a 58 y.o. female who presents today for follow up.   HPI: Follow-up cellulitis 09/29/2023 which which she started amoxicillin.  She completed doxycycline   She has completed amoxicillin and symptoms largely unchanged.   Soreness of area has improved however redness persistent and area feels warm.      No fever, purulent discharge.   Pending RUQ ultrasound.   Allergies: Lisinopril Current Outpatient Medications on File Prior to Visit  Medication Sig Dispense Refill   albuterol (PROVENTIL HFA;VENTOLIN HFA) 108 (90 Base) MCG/ACT inhaler Inhale 1-2 puffs into the lungs every 4 (four) hours as needed for wheezing or shortness of breath. 1 Inhaler 3   albuterol (VENTOLIN HFA) 108 (90 Base) MCG/ACT  inhaler Inhale 2 puffs into the lungs every 6 (six) hours as needed for wheezing or shortness of breath. 8 g 0   amLODipine (NORVASC) 10 MG tablet TAKE (1) TABLET DAILY. 90 tablet 1   budesonide-formoterol (SYMBICORT) 160-4.5 MCG/ACT inhaler Inhale 2 puffs into the lungs 2 (two) times daily. 1 each 3   escitalopram (LEXAPRO) 20 MG tablet Take 1 tablet (20 mg total) by mouth daily. 90 tablet 3   fluticasone (FLONASE) 50 MCG/ACT nasal spray Place 2 sprays into both nostrils daily. 16 g 6   gabapentin (NEURONTIN) 300 MG capsule TAKE TWO CAPSULES BY MOUTH EVERY MORNING, ONE CAPSULE midday AND TWO CAPSULES EVERY EVENING 300 capsule 6   levothyroxine (SYNTHROID) 25 MCG tablet Take 1.5 tablets (37.5 mcg total) by mouth daily. 135 tablet 3   losartan (COZAAR) 50 MG tablet Take 1.5 tablets (75 mg total) by mouth daily. 135 tablet 3   triamcinolone cream (KENALOG) 0.1 % Apply 1 application topically 4 (four) times daily as needed. 80 g 1   zolpidem (AMBIEN) 10 MG tablet Take 1 tablet (10 mg total) by mouth at bedtime as needed. for sleep 30 tablet 2   amoxicillin (AMOXIL) 875 MG tablet Take 1 tablet (875 mg total) by mouth 2 (two) times daily for 7 days. (Patient not taking: Reported on  10/06/2023) 14 tablet 0   Current Facility-Administered Medications on File Prior to Visit  Medication Dose Route Frequency Provider Last Rate Last Admin   heparin lock flush 100 unit/mL  500 Units Intravenous Once Janese Banks, MD       sodium chloride 0.9 % injection 10 mL  10 mL Intravenous PRN Janese Banks, MD       sodium chloride 0.9 % injection 10 mL  10 mL Intravenous PRN Janese Banks, MD   10 mL at 11/28/14 1120   sodium chloride flush (NS) 0.9 % injection 10 mL  10 mL Intravenous PRN Loann Quill, NP   10 mL at 01/03/16 1413   sodium chloride flush (NS) 0.9 % injection 10 mL  10 mL Intravenous PRN Jeralyn Ruths, MD   10 mL at 11/15/16 1111   sodium chloride flush (NS) 0.9 % injection 10 mL  10 mL  Intravenous PRN Earna Coder, MD   10 mL at 11/24/17 1434    Review of Systems  Constitutional:  Negative for chills and fever.  Respiratory:  Negative for cough.   Cardiovascular:  Negative for chest pain and palpitations.  Gastrointestinal:  Positive for abdominal pain. Negative for nausea and vomiting.  Skin:  Positive for color change. Negative for rash and wound.      Objective:    BP 134/76   Pulse 65   Temp 97.6 F (36.4 C) (Oral)   Ht 5\' 5"  (1.651 m)   Wt 254 lb 9.6 oz (115.5 kg)   LMP 12/11/2013   SpO2 97%   BMI 42.37 kg/m  BP Readings from Last 3 Encounters:  10/06/23 134/76  09/29/23 132/80  07/01/23 136/78   Wt Readings from Last 3 Encounters:  10/06/23 254 lb 9.6 oz (115.5 kg)  09/29/23 253 lb 9.6 oz (115 kg)  07/01/23 256 lb 9.6 oz (116.4 kg)    Physical Exam Vitals reviewed.  Constitutional:      Appearance: She is well-developed.  Eyes:     Conjunctiva/sclera: Conjunctivae normal.  Cardiovascular:     Rate and Rhythm: Normal rate and regular rhythm.     Pulses: Normal pulses.     Heart sounds: Normal heart sounds.  Pulmonary:     Effort: Pulmonary effort is normal.     Breath sounds: Normal breath sounds. No wheezing, rhonchi or rales.  Skin:    General: Skin is warm and dry.          Comments: Left mid quadrant erythema ~10cm  as marked on diagram.  Increased warmth.  Non fluctuant. No purulent drainage, laceration.  Skin is intact.  Neurological:     Mental Status: She is alert.  Psychiatric:        Speech: Speech normal.        Behavior: Behavior normal.        Thought Content: Thought content normal.

## 2023-10-07 ENCOUNTER — Encounter: Payer: Self-pay | Admitting: General Surgery

## 2023-10-07 ENCOUNTER — Ambulatory Visit (INDEPENDENT_AMBULATORY_CARE_PROVIDER_SITE_OTHER): Payer: Self-pay | Admitting: General Surgery

## 2023-10-07 VITALS — BP 159/90 | HR 74 | Temp 98.3°F | Ht 65.0 in | Wt 252.8 lb

## 2023-10-07 DIAGNOSIS — L03311 Cellulitis of abdominal wall: Secondary | ICD-10-CM

## 2023-10-07 DIAGNOSIS — R188 Other ascites: Secondary | ICD-10-CM

## 2023-10-07 NOTE — Patient Instructions (Signed)
 We have scheduled you for a CT Scan of your Abdomen and Pelvis with contrast. This has been scheduled at [facility] on 10/09/2023 . Please arrive there by 3:45 pm. If you need to reschedule your Scan, you may do so by calling [phone]. Please let us know if you reschedule your scan as we have to get authorization from your insurance for this.   Cellulitis, Adult Cellulitis is a skin infection. The infected area is often warm, red, swollen, and sore. It occurs most often on the legs, feet, and toes, but can happen on any part of the body. This condition can be life-threatening without treatment. It is very important to get treated right away. What are the causes? This condition is caused by bacteria. The bacteria enter through a break in the skin, such as: A cut. A burn. A bug bite. An animal bite. An open sore. A crack. What increases the risk? Having a weak body's defense system (immune system). Being older than 59 years old. Having a blood sugar problem (diabetes). Having a long-term liver disease (cirrhosis) or kidney disease. Being very overweight (obese). Having a skin problem, such as: An itchy rash. A rash caused by a fungus. A rash with blisters. Slow movement of blood in the veins (venous stasis). Fluid buildup below the skin (edema). This condition is more likely to occur in people who: Have open cuts, burns, bites, or scrapes on the skin. Have been treated with high-energy rays (radiation). Use IV drugs. What are the signs or symptoms? Skin that: Looks red or purple, or slightly darker than your usual skin color. Has streaks. Has spots. Is swollen. Is sore or painful when you touch it. Is warm. A fever. Chills. Blisters. Tiredness (fatigue). How is this treated? Medicines to treat infections or allergies. Rest. Placing cold or warm cloths on the skin. Staying in the hospital, if the condition is very bad. You may need medicines through an  IV. Follow these instructions at home: Medicines Take over-the-counter and prescription medicines only as told by your doctor. If you were prescribed antibiotics, take them as told by your doctor. Do not stop using them even if you start to feel better. General instructions Drink enough fluid to keep your pee (urine) pale yellow. Do not touch or rub the infected area. Raise (elevate) the infected area above the level of your heart while you are sitting or lying down. Return to your normal activities when your doctor says that it is safe. Place cold or warm cloths on the area as told by your doctor. Keep all follow-up visits. Your doctor will need to make sure that a more serious infection is not developing. Contact a doctor if: You have a fever. You do not start to get better after 1-2 days of treatment. Your bone or joint under the infected area starts to hurt after the skin has healed. Your infection comes back in the same area or another area. Signs of this may include: You have a swollen bump in the area. Your red area gets larger, turns dark in color, or hurts more. You have more fluid coming from the wound. Pus or a bad smell develops in your infected area. You have more pain. You feel sick and have muscle aches and weakness. You develop vomiting or watery poop that will not go away. Get help right away if: You see red streaks coming from the area. You notice the skin turns purple or black and falls off. These  symptoms may be an emergency. Get help right away. Call 911. Do not wait to see if the symptoms will go away. Do not drive yourself to the hospital. This information is not intended to replace advice given to you by your health care provider. Make sure you discuss any questions you have with your health care provider. Document Revised: 03/05/2022 Document Reviewed: 03/05/2022 Elsevier Patient Education  2024 [employer].

## 2023-10-08 ENCOUNTER — Other Ambulatory Visit: Payer: Self-pay | Admitting: Family

## 2023-10-08 ENCOUNTER — Encounter: Payer: Self-pay | Admitting: Family

## 2023-10-08 DIAGNOSIS — Z8639 Personal history of other endocrine, nutritional and metabolic disease: Secondary | ICD-10-CM

## 2023-10-08 MED ORDER — CHOLECALCIFEROL 1.25 MG (50000 UT) PO TABS: ORAL_TABLET | ORAL | 0 refills | Status: DC

## 2023-10-09 ENCOUNTER — Ambulatory Visit
Admission: RE | Admit: 2023-10-09 | Discharge: 2023-10-09 | Disposition: A | Discharge status: Home / Self Care | Source: Ambulatory Visit | Attending: General Surgery | Admitting: General Surgery

## 2023-10-09 DIAGNOSIS — L03311 Cellulitis of abdominal wall: Secondary | ICD-10-CM | POA: Insufficient documentation

## 2023-10-09 DIAGNOSIS — R935 Abnormal findings on diagnostic imaging of other abdominal regions, including retroperitoneum: Secondary | ICD-10-CM | POA: Diagnosis not present

## 2023-10-09 DIAGNOSIS — Z90710 Acquired absence of both cervix and uterus: Secondary | ICD-10-CM | POA: Diagnosis not present

## 2023-10-09 MED ORDER — IOHEXOL 300 MG/ML  SOLN
100.0000 mL | Freq: Once | INTRAMUSCULAR | Status: AC | PRN
  Administered 2023-10-09: 100 mL via INTRAVENOUS

## 2023-10-09 NOTE — Telephone Encounter (Signed)
 Pt sch with surgery 10/21/23  Please see if any cancelations

## 2023-10-09 NOTE — Telephone Encounter (Signed)
 Noted  Ct a/p sch today

## 2023-10-17 ENCOUNTER — Other Ambulatory Visit: Payer: Self-pay | Admitting: Family

## 2023-10-17 DIAGNOSIS — I10 Essential (primary) hypertension: Secondary | ICD-10-CM

## 2023-10-21 ENCOUNTER — Telehealth: Payer: Self-pay | Admitting: General Surgery

## 2023-10-21 ENCOUNTER — Ambulatory Visit: Payer: Self-pay | Admitting: General Surgery

## 2023-10-21 ENCOUNTER — Encounter: Payer: Self-pay | Admitting: General Surgery

## 2023-10-21 ENCOUNTER — Ambulatory Visit: Admitting: General Surgery

## 2023-10-21 VITALS — BP 144/83 | HR 58 | Temp 98.3°F | Ht 65.0 in | Wt 256.0 lb

## 2023-10-21 DIAGNOSIS — K654 Sclerosing mesenteritis: Secondary | ICD-10-CM

## 2023-10-21 DIAGNOSIS — R1902 Left upper quadrant abdominal swelling, mass and lump: Secondary | ICD-10-CM

## 2023-10-21 DIAGNOSIS — L03311 Cellulitis of abdominal wall: Secondary | ICD-10-CM

## 2023-10-21 HISTORY — DX: Cellulitis of abdominal wall: L03.311

## 2023-10-21 NOTE — Telephone Encounter (Signed)
 Patient has been advised of Pre-Admission date/time, and Surgery date at [facility].  Surgery Date: 11/28/23 Preadmission Testing Date: Preadmissions to contact patient directly.    Patient has been made aware to call [phone], between 1-3:00pm the day before surgery, to find out what time to arrive for surgery.

## 2023-10-21 NOTE — Progress Notes (Signed)
 Outpatient Surgical Follow Up  10/21/2023  Tammie Robinson is an 58 y.o. female.   Chief Complaint  Patient presents with   Follow-up    Cellulitis abdominal wall     HPI: The patient is a 58 year old female that returns today in consultation for a left abdominal wall mass.  She reports that since she is seeing me last that the overlying cellulitis has improved.  She had a CT scan that showed continued cysts inflammatory process in the left abdominal wall.  She denies any drainage from the area.  She sometimes has a little bit of pain from it.  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 Evette Cristal   COLONOSCOPY WITH PROPOFOL N/A 01/26/2016   Procedure: COLONOSCOPY WITH PROPOFOL through colostomy;  Surgeon: Midge Minium, MD;  Location: [facility];  Service: Endoscopy;  Laterality: N/A;  port-a-cath   COLONOSCOPY WITH PROPOFOL N/A 11/29/2019   Procedure: COLONOSCOPY WITH PROPOFOL;  Surgeon: Midge Minium, MD;  Location: [facility];  Service: Endoscopy;  Laterality: N/A;   POLYPECTOMY  01/26/2016   Procedure: POLYPECTOMY;  Surgeon: Midge Minium, MD;  Location: [facility];  Service: Endoscopy;;   POLYPECTOMY N/A 11/29/2019   Procedure: POLYPECTOMY;  Surgeon: Midge Minium, MD;  Location: [facility];  Service: Endoscopy;  Laterality: N/A;   PORTA CATH REMOVAL N/A 01/28/2018   Procedure: PORTA CATH REMOVAL;  Surgeon: Annice Needy, MD;  Location: [facility] INVASIVE CV LAB;  Service:  Cardiovascular;  Laterality: N/A;   PORTACATH PLACEMENT     PROCTECTOMY  04/12/14   [facility] - CA surgery - with colostomy   SALPINGOOPHORECTOMY Bilateral 04/12/14   [facility] - with CA surg   VAGINA RECONSTRUCTION SURGERY  04/12/14   [facility] - transabdonimal rectus abdominus muscle reconstruction   VAGINAL DELIVERY     2, preterm labor    Family History  Problem Relation Age of Onset   Thyroid disease Mother    Arthritis Mother    Stroke Mother    Hypertension Mother    Diabetes Mother    Arthritis Father    Heart disease Father 48       s/p CABG   Stroke Father    Hypertension Father    Alcohol abuse Maternal Grandmother    Diabetes Maternal Grandmother    Alcohol abuse Maternal Grandfather    Diabetes Maternal Grandfather    Breast cancer Paternal Grandmother    Cancer Paternal Grandmother        breast   Heart disease Paternal Grandfather    Breast cancer Paternal Aunt 32   Colon cancer Neg Hx     Social History:  reports that she has never smoked. She has never used smokeless tobacco. She reports that she does not drink alcohol and does not use drugs.  Allergies:  Allergies  Allergen  Reactions   Lisinopril Hives and Rash    Medications reviewed.    ROS Full ROS performed and is otherwise negative other than what is stated in HPI   BP (!) 144/83   Pulse (!) 58   Temp 98.3 F (36.8 C) (Oral)   Ht 5\' 5"  (1.651 m)   Wt 256 lb (116.1 kg)   LMP 12/11/2013   SpO2 100%   BMI 42.60 kg/m   Physical Exam Alert and oriented x 3, normal work of breathing on room air, regular rate and rhythm, abdomen is soft, obese, in the left upper abdomen there is an area of redness in the skin.  There is induration under this with some pain but no fluctuance.  She has a colostomy in place.   I have reviewed her CT scan.  There continues to be a inflammatory mass in the left upper quadrant with some overlying thickening of the skin.  This is not connected with her ostomy that is also on  the left side.    No results found for this or any previous visit (from the past 48 hours). No results found.  Assessment/Plan:  1. 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  I discussed with her that it could be fat necrosis, an abscess cavity, seroma or some kind of infected cyst.  Our options for treating this are to do nothing, do an abdominal wound exploration or observe it for several months.  She is interested in having this explored and possibly resected.  I discussed with her the risk, benefits alternatives of this approach including the risk of an further infection, recurrence, need for packing of the wound.  She does want to proceed with surgery.  She is going to her daughters Masters graduation in the first week of May and so we will schedule it for after that.  A total of 30 minutes was spent reviewing the patient's imaging, performing a history and physical and discussing treatment options with the patient.  Baker Pierini, M.D.  [facility]

## 2023-10-21 NOTE — Patient Instructions (Addendum)
 Our surgery scheduler will call you in 24/48 hrs. Surgery will be on 11/28/23 with Dr Maurine Minister

## 2023-11-12 ENCOUNTER — Other Ambulatory Visit: Payer: 59

## 2023-11-12 DIAGNOSIS — E890 Postprocedural hypothyroidism: Secondary | ICD-10-CM | POA: Diagnosis not present

## 2023-11-12 LAB — TSH: TSH: 10.33 m[IU]/L — ABNORMAL HIGH (ref 0.40–4.50)

## 2023-11-12 LAB — T4, FREE: Free T4: 0.9 ng/dL (ref 0.8–1.8)

## 2023-11-13 ENCOUNTER — Encounter: Payer: Self-pay | Admitting: Endocrinology

## 2023-11-16 ENCOUNTER — Encounter: Payer: Self-pay | Admitting: General Surgery

## 2023-11-17 ENCOUNTER — Encounter: Payer: Self-pay | Admitting: Endocrinology

## 2023-11-17 ENCOUNTER — Other Ambulatory Visit: Payer: Self-pay | Admitting: General Surgery

## 2023-11-17 ENCOUNTER — Ambulatory Visit: Payer: 59 | Admitting: Endocrinology

## 2023-11-17 DIAGNOSIS — E890 Postprocedural hypothyroidism: Secondary | ICD-10-CM | POA: Diagnosis not present

## 2023-11-17 MED ORDER — CEPHALEXIN 500 MG PO CAPS: 500.0000 mg | ORAL_CAPSULE | Freq: Four times a day (QID) | ORAL | 0 refills | Status: AC

## 2023-11-17 MED ORDER — LEVOTHYROXINE SODIUM 50 MCG PO TABS: 50.0000 ug | ORAL_TABLET | Freq: Every day | ORAL | 3 refills | Status: DC

## 2023-11-17 NOTE — Progress Notes (Signed)
 Outpatient Endocrinology Note [place] Kaitelyn Jamison, MD  11/17/23  Patient's Name: Tammie Robinson    DOB: 22-May-1966    MRN: [id number]  REASON OF VISIT: Follow-up for hypothyroidism  PCP: Calista Catching, FNP  HISTORY OF PRESENT ILLNESS:   LIANETTE ROTENBERRY is a 58 y.o. old female with past medical history as listed below is presented for a follow up for postablative hypothyroidism / thyroid  nodule.   Pertinent Thyroid  History: - Patient had history of hyperthyroidism, diagnosed in 2022 was treated with methimazole  initially 80 mg daily, and was gradually decreased, she had positive thyrotropin receptor antibody consistent with Graves' disease.  She was treated with RAI I-131 in December 2023 with 12.55 mCi.  Patient developed postablative hypothyroidism with TSH of 11 in March 2024 and started on levothyroxine  50 mcg daily. However in 5/24 her TSH was low and she was taking 50 mcg alternating with 25 mcg, decreased to 25 mcg daily in November 2024.  Levothyroxine  was increased to 37.5 Mg daily in February 2025 when elevated TSH of 7.  # Thyroid  nodule -Initially diagnosed in October 2022 and subsequently benign biopsy on Afirma testing in March 2023.  Ultrasound in October 2022 showed borderline thyromegaly with findings suggestive of multinodular goiter.  Nodule #1 measuring 3.8 cm.   Thyroid  scan of 03/04/2022 showed the following : 24 hour I-123 uptake = 81.1% (normal 10-30%) Large cold RIGHT thyroid  nodule    Homogeneous increased tracer uptake in the thyroid  lobes bilaterally with markedly elevated 4 hour and 24 hour radio iodine uptakes.   Needle aspiration biopsy of right thyroid  nodule was benign by FNA with Afirma in March 2024, initial cytology was AUS.   Interval history Patient been taking levothyroxine  13.5 mcg daily, taking in the morning at least 30 minutes before breakfast, does not take any vitamins or medication around that time.  Complains of fatigue.  No neck discomfort.   No other complaints today.  Recent thyroid  lab elevated TSH of 10.33 with normal free T4 as follows.   Latest Reference Range & Units 11/12/23 08:28  TSH 0.40 - 4.50 mIU/L 10.33 (H)  T4,Free(Direct) 0.8 - 1.8 ng/dL 0.9  (H): Data is abnormally high  REVIEW OF SYSTEMS:  As per history of present illness.   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 Lorel Roes   COLONOSCOPY WITH PROPOFOL  N/A 01/26/2016   Procedure: COLONOSCOPY WITH PROPOFOL  through colostomy;  Surgeon: Marnee Sink, MD;  Location: [facility];  Service: Endoscopy;  Laterality: N/A;  port-a-cath   COLONOSCOPY WITH PROPOFOL  N/A 11/29/2019   Procedure: COLONOSCOPY WITH PROPOFOL ;  Surgeon: Marnee Sink, MD;  Location: [facility];  Service: Endoscopy;  Laterality: N/A;   POLYPECTOMY  01/26/2016   Procedure: POLYPECTOMY;  Surgeon: Marnee Sink, MD;  Location: [facility];  Service: Endoscopy;;   POLYPECTOMY N/A 11/29/2019   Procedure: POLYPECTOMY;  Surgeon: Marnee Sink, MD;  Location: [facility];  Service: Endoscopy;  Laterality: N/A;   PORTA CATH REMOVAL N/A 01/28/2018   Procedure: PORTA CATH REMOVAL;  Surgeon: Celso College, MD;  Location: [facility] INVASIVE CV LAB;  Service: Cardiovascular;  Laterality: N/A;   PORTACATH PLACEMENT     PROCTECTOMY  04/12/14   [facility] - CA surgery - with colostomy   SALPINGOOPHORECTOMY Bilateral 04/12/14   [facility] - with CA surg   VAGINA  RECONSTRUCTION SURGERY  04/12/14   [facility] - transabdonimal rectus abdominus muscle reconstruction   VAGINAL DELIVERY     2, preterm labor    ALLERGIES: Allergies  Allergen Reactions   Lisinopril Hives and Rash    FAMILY HISTORY:  Family History  Problem Relation Age of Onset   Thyroid  disease Mother    Arthritis Mother    Stroke Mother    Hypertension Mother    Diabetes Mother    Arthritis Father    Heart disease Father 71       s/p CABG   Stroke Father    Hypertension Father    Alcohol abuse Maternal Grandmother    Diabetes Maternal Grandmother    Alcohol abuse Maternal Grandfather    Diabetes Maternal Grandfather    Breast cancer Paternal Grandmother    Cancer Paternal Grandmother        breast   Heart disease Paternal Grandfather    Breast cancer Paternal Aunt 30   Colon cancer Neg Hx     SOCIAL HISTORY: Social History   Socioeconomic History   Marital status: Married    Spouse name: Not on file   Number of children: Not on file   Years of education: Not on file   Highest education level: 12th grade  Occupational History   Not on file  Tobacco Use   Smoking status: Never   Smokeless tobacco: Never  Vaping Use   Vaping status: Never Used  Substance and Sexual Activity   Alcohol use: No    Comment: rare - holidays   Drug use: No   Sexual activity: Not on file  Other Topics Concern   Not on file  Social History Narrative   Lives in [place] with husband, has 2 children, 1 in college at [employer].      One daughter with leukemia, in remission   Other daughter has DM I.          Work - Advertising account executive   Diet - regular   Exercise - none recently   Social Drivers of Corporate investment banker Strain: Low Risk  (09/29/2023)   Overall Financial Resource Strain (CARDIA)    Difficulty of Paying Living Expenses: Not hard at all  Food Insecurity: No Food Insecurity (09/29/2023)   Hunger Vital Sign    Worried About Running Out of Food in the Last Year:  Never true    Ran Out of Food in the Last Year: Never true  Transportation Needs: No Transportation Needs (09/29/2023)   PRAPARE - Administrator, Civil Service (Medical): No    Lack of Transportation (Non-Medical): No  Physical Activity: Unknown (09/29/2023)   Exercise Vital Sign    Days of Exercise per Week: 0 days    Minutes of Exercise per Session: Not on file  Stress: Stress Concern Present (09/29/2023)   [employer] of Occupational Health - Occupational Stress Questionnaire  Feeling of Stress : To some extent  Social Connections: Unknown (09/29/2023)   Social Connection and Isolation Panel [NHANES]    Frequency of Communication with Friends and Family: More than three times a week    Frequency of Social Gatherings with Friends and Family: More than three times a week    Attends Religious Services: Patient declined    Database administrator or Organizations: Patient declined    Attends Engineer, structural: Not on file    Marital Status: Married    MEDICATIONS:  Current Outpatient Medications  Medication Sig Dispense Refill   acetaminophen (TYLENOL) 500 MG tablet Take 500-1,000 mg by mouth every 6 (six) hours as needed (pain.).     albuterol  (VENTOLIN  HFA) 108 (90 Base) MCG/ACT inhaler Inhale 2 puffs into the lungs every 6 (six) hours as needed for wheezing or shortness of breath. 8 g 0   amLODipine  (NORVASC ) 10 MG tablet TAKE (1) TABLET DAILY. (Patient taking differently: Take 10 mg by mouth at bedtime.) 90 tablet 0   budesonide -formoterol  (SYMBICORT ) 160-4.5 MCG/ACT inhaler Inhale 2 puffs into the lungs 2 (two) times daily. (Patient taking differently: Inhale 2 puffs into the lungs 2 (two) times daily as needed (respiratory issues).) 1 each 3   cephALEXin  (KEFLEX ) 500 MG capsule Take 1 capsule (500 mg total) by mouth 4 (four) times daily for 5 days. 20 capsule 0   diphenhydrAMINE (BENADRYL) 25 mg capsule Take 25-50 mg by mouth every 6 (six) hours as  needed for allergies.     escitalopram  (LEXAPRO ) 20 MG tablet Take 1 tablet (20 mg total) by mouth daily. (Patient taking differently: Take 20 mg by mouth at bedtime.) 90 tablet 3   fluticasone  (FLONASE ) 50 MCG/ACT nasal spray Place 2 sprays into both nostrils daily. (Patient taking differently: Place 2 sprays into both nostrils at bedtime.) 16 g 6   gabapentin  (NEURONTIN ) 300 MG capsule TAKE TWO CAPSULES BY MOUTH EVERY MORNING, ONE CAPSULE midday AND TWO CAPSULES EVERY EVENING 300 capsule 6   losartan  (COZAAR ) 50 MG tablet Take 1.5 tablets (75 mg total) by mouth daily. (Patient taking differently: Take 75 mg by mouth every evening.) 135 tablet 3   Vitamin D , Ergocalciferol , 50000 units CAPS Take 50,000 Units by mouth every Saturday.     zolpidem  (AMBIEN ) 10 MG tablet Take 1 tablet (10 mg total) by mouth at bedtime as needed. for sleep (Patient taking differently: Take 10 mg by mouth at bedtime. for sleep) 30 tablet 2   levothyroxine  (SYNTHROID ) 50 MCG tablet Take 1 tablet (50 mcg total) by mouth daily. 90 tablet 3   No current facility-administered medications for this visit.   Facility-Administered Medications Ordered in Other Visits  Medication Dose Route Frequency Provider Last Rate Last Admin   heparin  lock flush 100 unit/mL  500 Units Intravenous Once Pandit, Sandeep, MD       sodium chloride  0.9 % injection 10 mL  10 mL Intravenous PRN Rufus Council, MD       sodium chloride  0.9 % injection 10 mL  10 mL Intravenous PRN Rufus Council, MD   10 mL at 11/28/14 1120   sodium chloride  flush (NS) 0.9 % injection 10 mL  10 mL Intravenous PRN Ardell Koller, NP   10 mL at 01/03/16 1413   sodium chloride  flush (NS) 0.9 % injection 10 mL  10 mL Intravenous PRN Finnegan, Timothy J, MD   10 mL at 11/15/16 1111   sodium chloride  flush (NS) 0.9 %  injection 10 mL  10 mL Intravenous PRN Brahmanday, Govinda R, MD   10 mL at 11/24/17 1434    PHYSICAL EXAM: Vitals:   11/17/23 1332  BP: (!) 150/80   Pulse: 74  Resp: 20  SpO2: 98%  Weight: 237 lb 6.4 oz (107.7 kg)  Height: 5\' 5"  (1.651 m)    Body mass index is 39.51 kg/m.  Wt Readings from Last 3 Encounters:  11/17/23 237 lb 6.4 oz (107.7 kg)  10/21/23 256 lb (116.1 kg)  10/07/23 252 lb 12.8 oz (114.7 kg)    General: Well developed, well nourished female in no apparent distress.  HEENT: AT/[place], no external lesions. Hearing intact to the spoken word Neurologic: Alert, oriented, normal speech. DTR 2+ Psychiatric: Does not appear depressed or anxious Skin: Dry skin present.  PERTINENT HISTORIC LABORATORY AND IMAGING STUDIES:  All pertinent laboratory results were reviewed. Please see HPI also for further details.   TSH  Date Value Ref Range Status  11/12/2023 10.33 (H) 0.40 - 4.50 mIU/L Final  09/08/2023 7.66 (H) 0.40 - 4.50 mIU/L Final  06/03/2023 0.14 (L) 0.35 - 5.50 uIU/mL Final     ASSESSMENT / PLAN  1. Postablative hypothyroidism     -Patient has postablative hypothyroidism treated with RAI I-131 ablation for Graves' disease in December 2023. -She is currently taking levothyroxine  37.5 mcg daily. -Right thyroid  nodule: cold nodule on nuclear scan status post FNA with AUS and Afirma benign in March 2024.  Last ultrasound in December 2022.  Will check ultrasound thyroid  in 1 to 2 years to monitor right thyroid  nodule. -Recent lab with elevated TSH.    Plan: -Increase levothyroxine  from 37.5 mcg daily to 50 mcg daily. -Check thyroid  function test prior to follow-up visit in 2 months.   Diagnoses and all orders for this visit:  Postablative hypothyroidism -     levothyroxine  (SYNTHROID ) 50 MCG tablet; Take 1 tablet (50 mcg total) by mouth daily. -     T4, free -     TSH      DISPOSITION Follow up in clinic in 2 months suggested.  Labs prior to follow-up visit.  All questions answered and patient verbalized understanding of the plan.  [place] Joslynn Jamroz, MD [facility] Endocrinology [facility]  Group [address] [place], [address] [place], [place] [address] Phone # [phone]  At least part of this note was generated using voice recognition software. Inadvertent word errors may have occurred, which were not recognized during the proofreading process.   3

## 2023-11-20 ENCOUNTER — Other Ambulatory Visit: Payer: Self-pay

## 2023-11-20 ENCOUNTER — Encounter
Admission: RE | Admit: 2023-11-20 | Discharge: 2023-11-20 | Disposition: A | Discharge status: Home / Self Care | Source: Ambulatory Visit | Attending: General Surgery | Admitting: General Surgery

## 2023-11-20 HISTORY — DX: Depression, unspecified: F32.A

## 2023-11-20 HISTORY — DX: Fatty (change of) liver, not elsewhere classified: K76.0

## 2023-11-20 HISTORY — DX: Obstructive sleep apnea (adult) (pediatric): G47.33

## 2023-11-20 HISTORY — DX: Postprocedural hypothyroidism: E89.0

## 2023-11-20 HISTORY — DX: Nonrheumatic tricuspid (valve) insufficiency: I36.1

## 2023-11-20 HISTORY — DX: Essential (primary) hypertension: I10

## 2023-11-20 HISTORY — DX: Vitamin D deficiency, unspecified: E55.9

## 2023-11-20 NOTE — Patient Instructions (Addendum)
 Your procedure is scheduled on: Friday, May 9 Report to the Registration Desk on the 1st floor of the [facility] To find out your arrival time, please call [phone] between 1PM - 3PM on: Thursday, May 8 If your arrival time is 6:00 am, do not arrive before that time as the [facility] entrance doors do not open until 6:00 am.  REMEMBER: Instructions that are not followed completely may result in serious medical risk, up to and including death; or upon the discretion of your surgeon and anesthesiologist your surgery may need to be rescheduled.  Do not eat or drink after midnight the night before surgery.  No gum chewing or hard candies.  One week prior to surgery: starting May 2 Stop Anti-inflammatories (NSAIDS) such as Advil, Aleve, Ibuprofen, Motrin, Naproxen, Naprosyn and Aspirin based products such as Excedrin, Goody's Powder, BC Powder. Stop ANY OVER THE COUNTER supplements until after surgery.  You may however, continue to take Tylenol if needed for pain up until the day of surgery.  Continue taking all of your other prescription medications up until the day of surgery.  ON THE DAY OF SURGERY ONLY TAKE THESE MEDICATIONS WITH SIPS OF WATER :  budesonide -formoterol  (SYMBICORT ) inhaler gabapentin  (NEURONTIN )  levothyroxine  (SYNTHROID )   Use inhaler on the day of surgery and bring your albuterol  inhaler to the hospital.  No Alcohol for 24 hours before or after surgery.  No Smoking including e-cigarettes for 24 hours before surgery.  No chewable tobacco products for at least 6 hours before surgery.  No nicotine patches on the day of surgery.  Do not use any "recreational" drugs for at least a week (preferably 2 weeks) before your surgery.  Please be advised that the combination of cocaine and anesthesia may have negative outcomes, up to and including death. If you test positive for cocaine, your surgery will be cancelled.  On the morning of surgery brush your teeth with  toothpaste and water , you may rinse your mouth with mouthwash if you wish. Do not swallow any toothpaste or mouthwash.  Use CHG wipes as directed on instruction sheet. Avoid using these on your abdomen due to the cellulitis. Shower using your antibacterial soap as usual.  Do not wear jewelry, make-up, hairpins, clips or nail polish.  For welded (permanent) jewelry: bracelets, anklets, waist bands, etc.  Please have this removed prior to surgery.  If it is not removed, there is a chance that hospital personnel will need to cut it off on the day of surgery.  Do not wear lotions, powders, or perfumes.   Do not shave body hair from the neck down 48 hours before surgery.  Contact lenses, hearing aids and dentures may not be worn into surgery.  Do not bring valuables to the hospital. [facility] is not responsible for any missing/lost belongings or valuables.   Bring your C-PAP to the hospital in case you may have to spend the night.   Notify your doctor if there is any change in your medical condition (cold, fever, infection).  Wear comfortable clothing (specific to your surgery type) to the hospital.  After surgery, you can help prevent lung complications by doing breathing exercises.  Take deep breaths and cough every 1-2 hours. Your doctor may order a device called an Incentive Spirometer to help you take deep breaths. When coughing or sneezing, hold a pillow firmly against your incision with both hands. This is called "splinting." Doing this helps protect your incision. It also decreases belly discomfort.  If you are being discharged the day of surgery, you will not be allowed to drive home. You will need a responsible individual to drive you home and stay with you for 24 hours after surgery.   If you are taking public transportation, you will need to have a responsible individual with you.  Please call the Pre-admissions Testing Dept. at [phone] if you have any questions about  these instructions.  Surgery Visitation Policy:  Patients having surgery or a procedure may have two visitors.  Children under the age of 78 must have an adult with them who is not the patient.     Preparing the Skin Before Surgery     To help prevent the risk of infection at your surgical site, we are now providing you with rinse-free Sage 2% Chlorhexidine Gluconate (CHG) disposable wipes.  Chlorhexidine Gluconate (CHG) Soap  o An antiseptic cleaner that kills germs and bonds with the skin to continue killing germs even after washing  o Used for showering the night before surgery and morning of surgery  The night before surgery: Shower or bathe with warm water . Do not apply perfume, lotions, powders. Wait one hour after shower. Skin should be dry and cool. Open Sage wipe package - use 6 disposable cloths. Wipe body using one cloth for the right arm, one cloth for the left arm, one cloth for the right leg, one cloth for the left leg, one cloth for the chest/abdomen area, and one cloth for the back. Do not use on open wounds or sores. Do not use on face or genitals (private parts). If you are breast feeding, do not use on breasts. 5. Do not rinse, allow to dry. 6. Skin may feel "tacky" for several minutes. 7. Dress in clean clothes. 8. Place clean sheets on your bed and do not sleep with pets.  REPEAT ABOVE ON THE MORNING OF SURGERY BEFORE ARRIVING TO THE HOSPITAL.

## 2023-11-24 ENCOUNTER — Inpatient Hospital Stay: Admission: RE | Admit: 2023-11-24 | Source: Ambulatory Visit

## 2023-11-24 NOTE — Progress Notes (Signed)
 Patient ID: Tammie Robinson, female   DOB: May 16, 1966, 58 y.o.   MRN: [id number] CC: Abdominal Wall Mass History of Present Illness Tammie Robinson is a 58 y.o. female with past medical history significant for rectal cancer status post APR who presents to consultation for abdominal wall mass.  The patient reports that she has had this swelling in her abdominal wall for quite some time.  She reports that sometimes she has pain over it.  She also reports that she has noticed some redness around it and is actually been treated for cellulitis around this.  She has had a workup including an ultrasound and a CT scan.  The CT scan just shows a soft tissue fluid collection.  This is not contiguous with her ostomy nor is it contiguous with the underlying abdominal viscera.  She denies any drainage from the wound.  She denies any fevers or chills.  She has been having good ostomy output without any problems.  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 Lorel Roes   COLONOSCOPY WITH ESOPHAGOGASTRODUODENOSCOPY (EGD)  12/2013   Large rectal mass   COLONOSCOPY WITH PROPOFOL  N/A 01/26/2016   Procedure: COLONOSCOPY WITH PROPOFOL  through colostomy;  Surgeon: Marnee Sink, MD;   Location: [facility];  Service: Endoscopy;  Laterality: N/A;  port-a-cath   COLONOSCOPY WITH PROPOFOL  N/A 11/29/2019   Procedure: COLONOSCOPY WITH PROPOFOL ;  Surgeon: Marnee Sink, MD;  Location: [facility];  Service: Endoscopy;  Laterality: N/A;   ERCP  2012   POLYPECTOMY  01/26/2016   Procedure: POLYPECTOMY;  Surgeon: Marnee Sink, MD;  Location: [facility];  Service: Endoscopy;;   POLYPECTOMY N/A 11/29/2019   Procedure: POLYPECTOMY;  Surgeon: Marnee Sink, MD;  Location: [facility];  Service: Endoscopy;  Laterality: N/A;   PORTA CATH REMOVAL N/A 01/28/2018   Procedure: PORTA CATH REMOVAL;  Surgeon: Celso College, MD;  Location: [facility] INVASIVE CV LAB;  Service: Cardiovascular;  Laterality: N/A;   PORTACATH PLACEMENT Right 12/2013   PROCTECTOMY  04/12/2014   [facility] - CA surgery - with colostomy   TOTAL ABDOMINAL HYSTERECTOMY W/ BILATERAL SALPINGOOPHORECTOMY  04/12/2014   [facility] with CA surgery;( M.Arnett 01/2017)   VAGINA RECONSTRUCTION SURGERY  04/12/2014   [facility] - transabdonimal rectus abdominus muscle reconstruction   VAGINAL DELIVERY     2, preterm labor    Allergies  Allergen Reactions   Lisinopril Hives and Rash    Current Outpatient Medications  Medication Sig Dispense Refill   albuterol  (VENTOLIN  HFA) 108 (90 Base) MCG/ACT inhaler Inhale 2 puffs  into the lungs every 6 (six) hours as needed for wheezing or shortness of breath. 8 g 0   budesonide -formoterol  (SYMBICORT ) 160-4.5 MCG/ACT inhaler Inhale 2 puffs into the lungs 2 (two) times daily. (Patient taking differently: Inhale 2 puffs into the lungs 2 (two) times daily as needed (respiratory issues).) 1 each 3   escitalopram  (LEXAPRO ) 20 MG tablet Take 1 tablet (20 mg total) by mouth daily. (Patient taking differently: Take 20 mg by mouth at bedtime.) 90 tablet 3   fluticasone  (FLONASE ) 50 MCG/ACT nasal spray Place 2 sprays into both nostrils daily. (Patient taking differently: Place 2 sprays into  both nostrils at bedtime.) 16 g 6   gabapentin  (NEURONTIN ) 300 MG capsule TAKE TWO CAPSULES BY MOUTH EVERY MORNING, ONE CAPSULE midday AND TWO CAPSULES EVERY EVENING 300 capsule 6   losartan  (COZAAR ) 50 MG tablet Take 1.5 tablets (75 mg total) by mouth daily. (Patient taking differently: Take 75 mg by mouth every evening.) 135 tablet 3   zolpidem  (AMBIEN ) 10 MG tablet Take 1 tablet (10 mg total) by mouth at bedtime as needed. for sleep (Patient taking differently: Take 10 mg by mouth at bedtime. for sleep) 30 tablet 2   acetaminophen (TYLENOL) 500 MG tablet Take 500-1,000 mg by mouth every 6 (six) hours as needed (pain.).     amLODipine  (NORVASC ) 10 MG tablet TAKE (1) TABLET DAILY. (Patient taking differently: Take 10 mg by mouth at bedtime.) 90 tablet 0   diphenhydrAMINE (BENADRYL) 25 mg capsule Take 25-50 mg by mouth every 6 (six) hours as needed for allergies.     levothyroxine  (SYNTHROID ) 50 MCG tablet Take 1 tablet (50 mcg total) by mouth daily. 90 tablet 3   Vitamin D , Ergocalciferol , 50000 units CAPS Take 50,000 Units by mouth every Saturday.     No current facility-administered medications for this visit.   Facility-Administered Medications Ordered in Other Visits  Medication Dose Route Frequency Provider Last Rate Last Admin   heparin  lock flush 100 unit/mL  500 Units Intravenous Once Pandit, Sandeep, MD       sodium chloride  0.9 % injection 10 mL  10 mL Intravenous PRN Rufus Council, MD       sodium chloride  0.9 % injection 10 mL  10 mL Intravenous PRN Rufus Council, MD   10 mL at 11/28/14 1120   sodium chloride  flush (NS) 0.9 % injection 10 mL  10 mL Intravenous PRN Ardell Koller, NP   10 mL at 01/03/16 1413   sodium chloride  flush (NS) 0.9 % injection 10 mL  10 mL Intravenous PRN Finnegan, Timothy J, MD   10 mL at 11/15/16 1111   sodium chloride  flush (NS) 0.9 % injection 10 mL  10 mL Intravenous PRN Brahmanday, Govinda R, MD   10 mL at 11/24/17 1434    Family  History Family History  Problem Relation Age of Onset   Thyroid  disease Mother    Arthritis Mother    Stroke Mother    Hypertension Mother    Diabetes Mother    Arthritis Father    Heart disease Father 28       s/p CABG   Stroke Father    Hypertension Father    Alcohol abuse Maternal Grandmother    Diabetes Maternal Grandmother    Alcohol abuse Maternal Grandfather    Diabetes Maternal Grandfather    Breast cancer Paternal Grandmother    Cancer Paternal Grandmother        breast   Heart disease Paternal Grandfather  Breast cancer Paternal Aunt 40   Colon cancer Neg Hx        Social History Social History   Tobacco Use   Smoking status: Never   Smokeless tobacco: Never  Vaping Use   Vaping status: Never Used  Substance Use Topics   Alcohol use: No    Comment: rare - holidays   Drug use: No        ROS Full ROS of systems performed and is otherwise negative there than what is stated in the HPI  Physical Exam Blood pressure (!) 159/90, pulse 74, temperature 98.3 F (36.8 C), temperature source Oral, height 5\' 5"  (1.651 m), weight 252 lb 12.8 oz (114.7 kg), last menstrual period 12/11/2013, SpO2 97%.  Alert and oriented x 3, normal work of breathing on room air, regular rate and rhythm, abdomen soft, just above her ostomy on the left side there is an area of induration with surrounding cellulitis.  This is painful to touch.  There is no fluctuance or drainage.  Data Reviewed I have reviewed her ultrasound and there is a complex fluid collection with some internal debris.  She also has a CT from December that shows complex fluid collection in the left subcutaneous flank.  There is no connection to her ostomy nor is there any connection to the underlying viscera.  I have personally reviewed the patient's imaging and medical records.    Assessment    58 year old with left abdominal wall fluid collection.  She had a CT scan in December that showed this.  I think we  should repeat the CT scan to ensure that there is no growth or any changes from her CT scan.  Discussed with her that this could be some fat necrosis or some kind of loculated seroma.  Ultimately she may require surgery for exploration of her abdominal wall and resection of this.  We will see her again once the CT    Barrett Lick

## 2023-11-25 ENCOUNTER — Encounter
Admission: RE | Admit: 2023-11-25 | Discharge: 2023-11-25 | Disposition: A | Discharge status: Home / Self Care | Source: Ambulatory Visit | Attending: General Surgery | Admitting: General Surgery

## 2023-11-25 DIAGNOSIS — E890 Postprocedural hypothyroidism: Secondary | ICD-10-CM

## 2023-11-25 DIAGNOSIS — R001 Bradycardia, unspecified: Secondary | ICD-10-CM | POA: Insufficient documentation

## 2023-11-25 DIAGNOSIS — Z01818 Encounter for other preprocedural examination: Secondary | ICD-10-CM | POA: Diagnosis present

## 2023-11-25 DIAGNOSIS — Z01810 Encounter for preprocedural cardiovascular examination: Secondary | ICD-10-CM | POA: Diagnosis not present

## 2023-11-25 DIAGNOSIS — I10 Essential (primary) hypertension: Secondary | ICD-10-CM | POA: Diagnosis not present

## 2023-11-25 DIAGNOSIS — Z01812 Encounter for preprocedural laboratory examination: Secondary | ICD-10-CM

## 2023-11-25 LAB — BASIC METABOLIC PANEL WITH GFR
Anion gap: 8 (ref 5–15)
BUN: 16 mg/dL (ref 6–20)
CO2: 26 mmol/L (ref 22–32)
Calcium: 8.9 mg/dL (ref 8.9–10.3)
Chloride: 104 mmol/L (ref 98–111)
Creatinine, Ser: 0.85 mg/dL (ref 0.44–1.00)
GFR, Estimated: >60 mL/min (ref >60)
Glucose, Bld: 100 mg/dL — ABNORMAL HIGH (ref 70–99)
Potassium: 4.3 mmol/L (ref 3.5–5.1)
Sodium: 138 mmol/L (ref 135–145)

## 2023-11-27 MED ORDER — CHLORHEXIDINE GLUCONATE CLOTH 2 % EX PADS
6.0000 | MEDICATED_PAD | Freq: Once | CUTANEOUS | Status: AC
  Administered 2023-11-27: 6 via TOPICAL

## 2023-11-27 MED ORDER — ORAL CARE MOUTH RINSE: 15.0000 mL | Freq: Once | OROMUCOSAL | Status: AC

## 2023-11-27 MED ORDER — CEFAZOLIN SODIUM-DEXTROSE 2-4 GM/100ML-% IV SOLN
2.0000 g | INTRAVENOUS | Status: AC
  Administered 2023-11-28: 2 g via INTRAVENOUS

## 2023-11-27 MED ORDER — CHLORHEXIDINE GLUCONATE 0.12 % MT SOLN
15.0000 mL | Freq: Once | OROMUCOSAL | Status: AC
  Administered 2023-11-28: 15 mL via OROMUCOSAL

## 2023-11-27 MED ORDER — LACTATED RINGERS IV SOLN: INTRAVENOUS | Status: DC

## 2023-11-27 MED ORDER — CHLORHEXIDINE GLUCONATE CLOTH 2 % EX PADS
6.0000 | MEDICATED_PAD | Freq: Once | CUTANEOUS | Status: AC
  Administered 2023-11-28: 6 via TOPICAL

## 2023-11-28 ENCOUNTER — Ambulatory Visit: Payer: Self-pay | Admitting: Urgent Care

## 2023-11-28 ENCOUNTER — Encounter: Payer: Self-pay | Admitting: General Surgery

## 2023-11-28 ENCOUNTER — Other Ambulatory Visit: Payer: Self-pay

## 2023-11-28 ENCOUNTER — Encounter
Admission: RE | Disposition: A | Discharge status: Home / Self Care | Payer: Self-pay | Source: Home / Self Care | Attending: General Surgery

## 2023-11-28 ENCOUNTER — Ambulatory Visit
Admission: RE | Admit: 2023-11-28 | Discharge: 2023-11-28 | Disposition: A | Discharge status: Home / Self Care | Attending: General Surgery | Admitting: General Surgery

## 2023-11-28 DIAGNOSIS — J45909 Unspecified asthma, uncomplicated: Secondary | ICD-10-CM | POA: Insufficient documentation

## 2023-11-28 DIAGNOSIS — Z8249 Family history of ischemic heart disease and other diseases of the circulatory system: Secondary | ICD-10-CM | POA: Insufficient documentation

## 2023-11-28 DIAGNOSIS — E890 Postprocedural hypothyroidism: Secondary | ICD-10-CM

## 2023-11-28 DIAGNOSIS — Z01810 Encounter for preprocedural cardiovascular examination: Secondary | ICD-10-CM

## 2023-11-28 DIAGNOSIS — Z01812 Encounter for preprocedural laboratory examination: Secondary | ICD-10-CM

## 2023-11-28 DIAGNOSIS — K654 Sclerosing mesenteritis: Secondary | ICD-10-CM | POA: Diagnosis not present

## 2023-11-28 DIAGNOSIS — R1900 Intra-abdominal and pelvic swelling, mass and lump, unspecified site: Secondary | ICD-10-CM | POA: Diagnosis not present

## 2023-11-28 DIAGNOSIS — G709 Myoneural disorder, unspecified: Secondary | ICD-10-CM | POA: Diagnosis not present

## 2023-11-28 DIAGNOSIS — R011 Cardiac murmur, unspecified: Secondary | ICD-10-CM | POA: Insufficient documentation

## 2023-11-28 DIAGNOSIS — E65 Localized adiposity: Secondary | ICD-10-CM | POA: Diagnosis not present

## 2023-11-28 DIAGNOSIS — G4730 Sleep apnea, unspecified: Secondary | ICD-10-CM | POA: Insufficient documentation

## 2023-11-28 DIAGNOSIS — I10 Essential (primary) hypertension: Secondary | ICD-10-CM | POA: Insufficient documentation

## 2023-11-28 DIAGNOSIS — L02211 Cutaneous abscess of abdominal wall: Secondary | ICD-10-CM | POA: Diagnosis not present

## 2023-11-28 DIAGNOSIS — R1909 Other intra-abdominal and pelvic swelling, mass and lump: Secondary | ICD-10-CM | POA: Diagnosis not present

## 2023-11-28 DIAGNOSIS — Z79899 Other long term (current) drug therapy: Secondary | ICD-10-CM | POA: Diagnosis not present

## 2023-11-28 DIAGNOSIS — M7989 Other specified soft tissue disorders: Secondary | ICD-10-CM | POA: Diagnosis not present

## 2023-11-28 DIAGNOSIS — R222 Localized swelling, mass and lump, trunk: Secondary | ICD-10-CM | POA: Diagnosis present

## 2023-11-28 HISTORY — PX: EXCISION OF ABDOMINAL WALL TUMOR: SHX6687

## 2023-11-28 SURGERY — EXCISION, NEOPLASM, ABDOMINAL WALL
Anesthesia: General | Site: Abdomen

## 2023-11-28 MED ORDER — BUPIVACAINE-EPINEPHRINE (PF) 0.5% -1:200000 IJ SOLN
INTRAMUSCULAR | Status: AC
  Filled 2023-11-28: qty 30

## 2023-11-28 MED ORDER — FENTANYL CITRATE (PF) 100 MCG/2ML IJ SOLN
INTRAMUSCULAR | Status: DC | PRN
  Administered 2023-11-28 (×2): 50 ug via INTRAVENOUS

## 2023-11-28 MED ORDER — LIDOCAINE HCL (CARDIAC) PF 100 MG/5ML IV SOSY
PREFILLED_SYRINGE | INTRAVENOUS | Status: DC | PRN
  Administered 2023-11-28: 80 mg via INTRAVENOUS

## 2023-11-28 MED ORDER — FENTANYL CITRATE (PF) 100 MCG/2ML IJ SOLN
INTRAMUSCULAR | Status: AC
  Filled 2023-11-28: qty 2

## 2023-11-28 MED ORDER — OXYCODONE HCL 5 MG PO TABS
5.0000 mg | ORAL_TABLET | Freq: Four times a day (QID) | ORAL | 0 refills | Status: DC | PRN
Start: 2023-11-28 — End: 2023-12-11

## 2023-11-28 MED ORDER — EPHEDRINE SULFATE-NACL 50-0.9 MG/10ML-% IV SOSY
PREFILLED_SYRINGE | INTRAVENOUS | Status: DC | PRN
  Administered 2023-11-28 (×3): 5 mg via INTRAVENOUS

## 2023-11-28 MED ORDER — ROCURONIUM BROMIDE 100 MG/10ML IV SOLN
INTRAVENOUS | Status: DC | PRN
  Administered 2023-11-28: 50 mg via INTRAVENOUS

## 2023-11-28 MED ORDER — CHLORHEXIDINE GLUCONATE 0.12 % MT SOLN
OROMUCOSAL | Status: AC
  Filled 2023-11-28: qty 15

## 2023-11-28 MED ORDER — GLYCOPYRROLATE 0.2 MG/ML IJ SOLN
INTRAMUSCULAR | Status: DC | PRN
  Administered 2023-11-28: .2 mg via INTRAVENOUS

## 2023-11-28 MED ORDER — MIDAZOLAM HCL 2 MG/2ML IJ SOLN
INTRAMUSCULAR | Status: AC
  Filled 2023-11-28: qty 2

## 2023-11-28 MED ORDER — STERILE WATER FOR IRRIGATION IR SOLN
Status: DC | PRN
  Administered 2023-11-28: 500 mL

## 2023-11-28 MED ORDER — MIDAZOLAM HCL 2 MG/2ML IJ SOLN
INTRAMUSCULAR | Status: DC | PRN
  Administered 2023-11-28: 2 mg via INTRAVENOUS

## 2023-11-28 MED ORDER — ONDANSETRON HCL 4 MG/2ML IJ SOLN
INTRAMUSCULAR | Status: DC | PRN
  Administered 2023-11-28: 4 mg via INTRAVENOUS

## 2023-11-28 MED ORDER — DEXAMETHASONE SODIUM PHOSPHATE 10 MG/ML IJ SOLN
INTRAMUSCULAR | Status: DC | PRN
  Administered 2023-11-28: 10 mg via INTRAVENOUS

## 2023-11-28 MED ORDER — PROPOFOL 10 MG/ML IV BOLUS
INTRAVENOUS | Status: DC | PRN
  Administered 2023-11-28: 150 ug/kg/min via INTRAVENOUS
  Administered 2023-11-28: 200 mg via INTRAVENOUS

## 2023-11-28 MED ORDER — KETOROLAC TROMETHAMINE 30 MG/ML IJ SOLN
INTRAMUSCULAR | Status: DC | PRN
  Administered 2023-11-28: 30 mg via INTRAVENOUS

## 2023-11-28 MED ORDER — SUGAMMADEX SODIUM 200 MG/2ML IV SOLN
INTRAVENOUS | Status: DC | PRN
  Administered 2023-11-28: 200 mg via INTRAVENOUS

## 2023-11-28 MED ORDER — PROPOFOL 1000 MG/100ML IV EMUL
INTRAVENOUS | Status: AC
  Filled 2023-11-28: qty 100

## 2023-11-28 MED ORDER — BUPIVACAINE-EPINEPHRINE (PF) 0.5% -1:200000 IJ SOLN
INTRAMUSCULAR | Status: DC | PRN
  Administered 2023-11-28: 40 mL via INTRAMUSCULAR

## 2023-11-28 MED ORDER — BUPIVACAINE LIPOSOME 1.3 % IJ SUSP
INTRAMUSCULAR | Status: AC
Start: 2023-11-28 — End: ?
  Filled 2023-11-28: qty 10

## 2023-11-28 MED ORDER — CEFAZOLIN SODIUM-DEXTROSE 2-4 GM/100ML-% IV SOLN
INTRAVENOUS | Status: AC
  Filled 2023-11-28: qty 100

## 2023-11-28 MED ORDER — PROPOFOL 10 MG/ML IV BOLUS
INTRAVENOUS | Status: AC
  Filled 2023-11-28: qty 40

## 2023-11-28 MED ORDER — DEXMEDETOMIDINE HCL IN NACL 80 MCG/20ML IV SOLN
INTRAVENOUS | Status: DC | PRN
  Administered 2023-11-28: 8 ug via INTRAVENOUS

## 2023-11-28 SURGICAL SUPPLY — 24 items
BLADE SURG 15 STRL LF DISP TIS (BLADE) ×2 IMPLANT
CHLORAPREP W/TINT 26 (MISCELLANEOUS) IMPLANT
DERMABOND ADVANCED .7 DNX12 (GAUZE/BANDAGES/DRESSINGS) ×2 IMPLANT
DRAPE LAPAROTOMY 100X77 ABD (DRAPES) ×2 IMPLANT
ELECTRODE REM PT RTRN 9FT ADLT (ELECTROSURGICAL) ×2 IMPLANT
GLOVE BIOGEL PI IND STRL 7.5 (GLOVE) ×2 IMPLANT
GLOVE SURG SYN 7.0 (GLOVE) ×1 IMPLANT
GLOVE SURG SYN 7.0 PF PI (GLOVE) ×2 IMPLANT
GOWN STRL REUS W/ TWL LRG LVL3 (GOWN DISPOSABLE) ×4 IMPLANT
KIT TURNOVER KIT A (KITS) ×2 IMPLANT
LABEL OR SOLS (LABEL) ×2 IMPLANT
MANIFOLD NEPTUNE II (INSTRUMENTS) ×2 IMPLANT
NDL HYPO 22X1.5 SAFETY MO (MISCELLANEOUS) ×2 IMPLANT
NEEDLE HYPO 22X1.5 SAFETY MO (MISCELLANEOUS) ×1 IMPLANT
NS IRRIG 500ML POUR BTL (IV SOLUTION) ×2 IMPLANT
PACK BASIN MINOR ARMC (MISCELLANEOUS) ×2 IMPLANT
SPONGE T-LAP 18X18 ~~LOC~~+RFID (SPONGE) IMPLANT
SUT SILK 2 0 SH (SUTURE) IMPLANT
SUT VIC AB 3-0 SH 27X BRD (SUTURE) ×2 IMPLANT
SUTURE MNCRL 4-0 27XMF (SUTURE) ×2 IMPLANT
SYR 10ML LL (SYRINGE) ×2 IMPLANT
SYR 20ML LL LF (SYRINGE) ×2 IMPLANT
TRAP FLUID SMOKE EVACUATOR (MISCELLANEOUS) ×2 IMPLANT
WATER STERILE IRR 500ML POUR (IV SOLUTION) ×2 IMPLANT

## 2023-11-28 NOTE — H&P (Signed)
 The patient has been treated in the interim for cellulitis in the area. I discussed with her the r/b/a again of abdominal wall exploration including risk of infection, bleeding and damage to underlying structuresas well as recurrence. Proceed as planned.    Tammie Robinson is an 58 y.o. female.        Chief Complaint  Patient presents with   Follow-up      Cellulitis abdominal wall       HPI: The patient is a 58 year old female that returns today in consultation for a left abdominal wall mass.  She reports that since she is seeing me last that the overlying cellulitis has improved.  She had a CT scan that showed continued cysts inflammatory process in the left abdominal wall.  She denies any drainage from the area.  She sometimes has a little bit of pain from it.       Past Medical History:  Diagnosis Date   Anemia      prior to CA dx   Asthma      seasonal,never hospitalized   Blood in stool     Colostomy in place [facility])     History of ERCP     Hypertension     Migraine     Neuropathy      feet and hands due to chemo   PTSD (post-traumatic stress disorder)     Rectal cancer (HCC)     Systolic murmur     Wears contact lenses                 Past Surgical History:  Procedure Laterality Date   ABDOMINAL HYSTERECTOMY   04/12/2014    [facility] with CA surgery; no cervix ( M.Arnett 01/2017)   CHOLECYSTECTOMY   2012    Dr. Lorel Roes   COLONOSCOPY WITH PROPOFOL  N/A 01/26/2016    Procedure: COLONOSCOPY WITH PROPOFOL  through colostomy;  Surgeon: Marnee Sink, MD;  Location: [facility];  Service: Endoscopy;  Laterality: N/A;  port-a-cath   COLONOSCOPY WITH PROPOFOL  N/A 11/29/2019    Procedure: COLONOSCOPY WITH PROPOFOL ;  Surgeon: Marnee Sink, MD;  Location: [facility];  Service: Endoscopy;  Laterality: N/A;   POLYPECTOMY   01/26/2016    Procedure: POLYPECTOMY;  Surgeon: Marnee Sink, MD;  Location: [facility];  Service: Endoscopy;;   POLYPECTOMY N/A 11/29/2019     Procedure: POLYPECTOMY;  Surgeon: Marnee Sink, MD;  Location: [facility];  Service: Endoscopy;  Laterality: N/A;   PORTA CATH REMOVAL N/A 01/28/2018    Procedure: PORTA CATH REMOVAL;  Surgeon: Celso College, MD;  Location: [facility] INVASIVE CV LAB;  Service: Cardiovascular;  Laterality: N/A;   PORTACATH PLACEMENT       PROCTECTOMY   04/12/14    [facility] - CA surgery - with colostomy   SALPINGOOPHORECTOMY Bilateral 04/12/14    [facility] - with CA surg   VAGINA RECONSTRUCTION SURGERY   04/12/14    [facility] - transabdonimal rectus abdominus muscle reconstruction   VAGINAL DELIVERY        2, preterm labor               Family History  Problem Relation Age of Onset   Thyroid  disease Mother     Arthritis Mother     Stroke Mother     Hypertension Mother     Diabetes Mother     Arthritis Father     Heart disease Father 52        s/p CABG  Stroke Father     Hypertension Father     Alcohol abuse Maternal Grandmother     Diabetes Maternal Grandmother     Alcohol abuse Maternal Grandfather     Diabetes Maternal Grandfather     Breast cancer Paternal Grandmother     Cancer Paternal Grandmother          breast   Heart disease Paternal Grandfather     Breast cancer Paternal Aunt 45   Colon cancer Neg Hx            Social History:  reports that she has never smoked. She has never used smokeless tobacco. She reports that she does not drink alcohol and does not use drugs.   Allergies:  Allergies      Allergies  Allergen Reactions   Lisinopril Hives and Rash        Medications reviewed.       ROS Full ROS performed and is otherwise negative other than what is stated in HPI     BP (!) 144/83   Pulse (!) 58   Temp 98.3 F (36.8 C) (Oral)   Ht 5\' 5"  (1.651 m)   Wt 256 lb (116.1 kg)   LMP 12/11/2013   SpO2 100%   BMI 42.60 kg/m    Physical Exam Alert and oriented x 3, normal work of breathing on room air, regular rate and rhythm, abdomen is soft, obese, in the left upper  abdomen there is an area of redness in the skin.  There is induration under this with some pain but no fluctuance.  She has a colostomy in place.     I have reviewed her CT scan.  There continues to be a inflammatory mass in the left upper quadrant with some overlying thickening of the skin.  This is not connected with her ostomy that is also on the left side.       Lab Results Last 48 Hours  No results found for this or any previous visit (from the past 48 hours).   Imaging Results (Last 48 hours)  No results found.     Assessment/Plan:   1. Fat necrosis of abdominal wall (HCC) (Primary)   I discussed with the patient about options for this subcutaneous mass.  I discussed with her that it could be fat necrosis, an abscess cavity, seroma or some kind of infected cyst.  Our options for treating this are to do nothing, do an abdominal wound exploration or observe it for several months.  She is interested in having this explored and possibly resected.  I discussed with her the risk, benefits alternatives of this approach including the risk of an further infection, recurrence, need for packing of the wound.  She does want to proceed with surgery.  She is going to her daughters Masters graduation in the first week of May and so we will schedule it for after that.   A total of 30 minutes was spent reviewing the patient's imaging, performing a history and physical and discussing treatment options with the patient.   Severa Daniels, M.D. [facility]

## 2023-11-28 NOTE — Op Note (Signed)
 Operative note  Preoperative diagnosis: Abdominal wall mass Postoperative diagnosis: Abdominal wall mass measuring approximately 5.5 cm extending into the muscle Surgeon: Severa Daniels, MD EBL: 10 cc Procedure: Excision of abdominal wall mass subfascial measuring approximately 5.5 cm   After informed consent was saved the patient was brought to the operating room placed supine on the operating room table.  General endotracheal anesthesia was induced and her abdomen was then prepped and draped in the usual sterile fashion.  Prior to prepping I actually used an ultrasound to identify the mass.  It appeared to extend beneath the fascial layer.  After prepping a surgical timeout was called identifying correct patient, site, side and procedure.  The skin was then infiltrated with 10 cc of liposomal bupivacaine and Marcaine solution.  An elliptical incision was made over the mass.  This was dissected out from the subcutaneous tissue.  The deep margin extended through the fascia into the muscle.  I did attempted dissected off of the fascia to minimize how much fascia was disrupted.  Once it was dissected off of the muscle some of the fascia was taken in the deep margin was grossly uninvolved.  I did develop subcutaneous flaps on the inferior and superior margins to fully encompass the mass.  Once it was dissected out from all the subcutaneous tissue it was released and passed off the table as specimen.  Prior to passing off I did mark the specimen with a single short stitch superior and double long stitch laterally.  Upon inspection of the area there was some grossly involved superior margin.  This was dissected off of the fascia and an additional superior margin was excised and sent off for pathology.  The total mass measured approximately 5.5 cm.  The wound was then copiously irrigated with warm saline solution.  The surrounding skin and deep tissue was then infiltrated with another 30 cc of liposomal  bupivacaine and Marcaine the Scarpa's fascia was then closed with 3-0 Vicryl and the subcutaneous tissue was closed with 3-0 Vicryl.  The skin was then closed with 4-0 Monocryl and dressed with surgical glue.  The patient was awoken from general endotracheal anesthesia and transferred to the PACU in good condition.

## 2023-11-28 NOTE — Anesthesia Postprocedure Evaluation (Signed)
 Anesthesia Post Note  Patient: Tammie Robinson  Procedure(s) Performed: EXCISION, ABDOMINAL WALL MASS (Abdomen)  Patient location during evaluation: PACU Anesthesia Type: General Level of consciousness: awake and alert Pain management: pain level controlled Vital Signs Assessment: post-procedure vital signs reviewed and stable Respiratory status: spontaneous breathing, nonlabored ventilation, respiratory function stable and patient connected to nasal cannula oxygen Cardiovascular status: blood pressure returned to baseline and stable Postop Assessment: no apparent nausea or vomiting Anesthetic complications: no   No notable events documented.   Last Vitals:  Vitals:   11/28/23 0925 11/28/23 0940  BP:  (!) 152/80  Pulse: (!) 56 (!) 53  Resp: 15 20  Temp: 36.7 C (!) 36.1 C  SpO2: 93% 97%    Last Pain:  Vitals:   11/28/23 0940  TempSrc: Temporal  PainSc: 0-No pain                 Zula Hitch

## 2023-11-28 NOTE — Transfer of Care (Signed)
 Immediate Anesthesia Transfer of Care Note  Patient: Tammie Robinson  Procedure(s) Performed: EXCISION, ABDOMINAL WALL MASS (Abdomen)  Patient Location: PACU  Anesthesia Type:General  Level of Consciousness: awake, drowsy, and patient cooperative  Airway & Oxygen Therapy: Patient Spontanous Breathing and Patient connected to face mask oxygen  Post-op Assessment: Report given to RN, Post -op Vital signs reviewed and stable, and Patient moving all extremities X 4  Post vital signs: Reviewed and stable  Last Vitals:  Vitals Value Taken Time  BP 112/57 11/28/23 0847  Temp    Pulse 66 11/28/23 0849  Resp 13 11/28/23 0849  SpO2 100 % 11/28/23 0849  Vitals shown include unfiled device data.  Last Pain:  Vitals:   11/28/23 0619  TempSrc: Temporal  PainSc: 0-No pain         Complications: No notable events documented.

## 2023-11-28 NOTE — Anesthesia Preprocedure Evaluation (Signed)
 Anesthesia Evaluation  Patient identified by MRN, date of birth, ID band Patient awake    Reviewed: Allergy & Precautions, H&P , NPO status , Patient's Chart, lab work & pertinent test results, reviewed documented beta blocker date and time   Airway Mallampati: III  TM Distance: <3 FB Neck ROM: full    Dental  (+) Teeth Intact, Chipped   Pulmonary neg pulmonary ROS, asthma , sleep apnea    Pulmonary exam normal        Cardiovascular Exercise Tolerance: Good hypertension, Pt. on medications negative cardio ROS Normal cardiovascular exam+ Valvular Problems/Murmurs  Rhythm:regular Rate:Normal     Neuro/Psych  Headaches PSYCHIATRIC DISORDERS Anxiety Depression     Neuromuscular disease negative neurological ROS  negative psych ROS   GI/Hepatic negative GI ROS, Neg liver ROS,,,  Endo/Other  Hypothyroidism    Renal/GU negative Renal ROS  negative genitourinary   Musculoskeletal   Abdominal   Peds  Hematology negative hematology ROS (+) Blood dyscrasia, anemia   Anesthesia Other Findings Past Medical History: No date: Anemia     Comment:  prior to CA dx No date: Asthma     Comment:  seasonal,never hospitalized No date: Blood in stool 10/2023: Cellulitis of abdominal wall No date: Colostomy in place [facility]) No date: Depression No date: Hepatic steatosis 2022: History of hyperthyroidism     Comment:  treated with methimazole  and radiation No date: Migraine No date: Neuropathy     Comment:  feet and hands due to chemo No date: Nonrheumatic tricuspid valve regurgitation No date: Obstructive sleep apnea on CPAP     Comment:  mild No date: Postablative hypothyroidism No date: Primary hypertension No date: PTSD (post-traumatic stress disorder) 12/22/2013: Rectal cancer (HCC) 04/2021: Right thyroid  nodule No date: Systolic murmur No date: Vitamin D  deficiency No date: Wears contact lenses Past Surgical  History: 2012: CHOLECYSTECTOMY     Comment:  Dr. Lorel Roes 12/2013: COLONOSCOPY WITH ESOPHAGOGASTRODUODENOSCOPY (EGD)     Comment:  Large rectal mass 01/26/2016: COLONOSCOPY WITH PROPOFOL ; N/A     Comment:  Procedure: COLONOSCOPY WITH PROPOFOL  through colostomy;               Surgeon: Marnee Sink, MD;  Location: [facility];              Service: Endoscopy;  Laterality: N/A;  port-a-cath 11/29/2019: COLONOSCOPY WITH PROPOFOL ; N/A     Comment:  Procedure: COLONOSCOPY WITH PROPOFOL ;  Surgeon: Marnee Sink, MD;  Location: [facility];  Service:               Endoscopy;  Laterality: N/A; 2012: ERCP 01/26/2016: POLYPECTOMY     Comment:  Procedure: POLYPECTOMY;  Surgeon: Marnee Sink, MD;                Location: [facility];  Service: Endoscopy;; 11/29/2019: POLYPECTOMY; N/A     Comment:  Procedure: POLYPECTOMY;  Surgeon: Marnee Sink, MD;                Location: [facility];  Service: Endoscopy;                Laterality: N/A; 01/28/2018: PORTA CATH REMOVAL; N/A     Comment:  Procedure: PORTA CATH REMOVAL;  Surgeon: Celso College,               MD;  Location: [facility] INVASIVE CV LAB;  Service:  Cardiovascular;  Laterality: N/A; 12/2013: PORTACATH PLACEMENT; Right 04/12/2014: PROCTECTOMY     Comment:  [facility] - CA surgery - with colostomy 04/12/2014: TOTAL ABDOMINAL HYSTERECTOMY W/ BILATERAL  SALPINGOOPHORECTOMY     Comment:  [facility] with CA surgery;( M.Arnett 01/2017) 04/12/2014: VAGINA RECONSTRUCTION SURGERY     Comment:  [facility] - transabdonimal rectus abdominus muscle               reconstruction No date: VAGINAL DELIVERY     Comment:  2, preterm labor BMI    Body Mass Index: 42.77 kg/m     Reproductive/Obstetrics negative OB ROS                             Anesthesia Physical Anesthesia Plan  ASA: 3  Anesthesia Plan: General ETT and General LMA   Post-op Pain Management:    Induction:   PONV Risk  Score and Plan: 4 or greater  Airway Management Planned:   Additional Equipment:   Intra-op Plan:   Post-operative Plan:   Informed Consent: I have reviewed the patients History and Physical, chart, labs and discussed the procedure including the risks, benefits and alternatives for the proposed anesthesia with the patient or authorized representative who has indicated his/her understanding and acceptance.     Dental Advisory Given  Plan Discussed with: CRNA  Anesthesia Plan Comments:        Anesthesia Quick Evaluation

## 2023-11-28 NOTE — Anesthesia Procedure Notes (Signed)
 Procedure Name: Intubation Date/Time: 11/28/2023 7:30 AM  Performed by: Noelia Batman, CRNAPre-anesthesia Checklist: Patient identified, Emergency Drugs available, Suction available and Patient being monitored Patient Re-evaluated:Patient Re-evaluated prior to induction Oxygen Delivery Method: Circle System Utilized Preoxygenation: Pre-oxygenation with 100% oxygen Induction Type: IV induction Ventilation: Mask ventilation without difficulty Laryngoscope Size: McGrath and 4 Grade View: Grade I Tube type: Oral Tube size: 7.0 mm Number of attempts: 1 Airway Equipment and Method: Stylet and Oral airway Placement Confirmation: ETT inserted through vocal cords under direct vision, positive ETCO2 and breath sounds checked- equal and bilateral Secured at: 22 cm Tube secured with: Tape Dental Injury: Teeth and Oropharynx as per pre-operative assessment

## 2023-11-29 ENCOUNTER — Encounter: Payer: Self-pay | Admitting: General Surgery

## 2023-12-01 LAB — SURGICAL PATHOLOGY

## 2023-12-04 ENCOUNTER — Other Ambulatory Visit: Payer: Self-pay | Admitting: Family

## 2023-12-04 DIAGNOSIS — C20 Malignant neoplasm of rectum: Secondary | ICD-10-CM

## 2023-12-11 ENCOUNTER — Ambulatory Visit (INDEPENDENT_AMBULATORY_CARE_PROVIDER_SITE_OTHER): Admitting: General Surgery

## 2023-12-11 ENCOUNTER — Encounter: Payer: Self-pay | Admitting: General Surgery

## 2023-12-11 VITALS — BP 135/85 | HR 53 | Temp 98.5°F | Ht 65.0 in | Wt 237.0 lb

## 2023-12-11 DIAGNOSIS — K654 Sclerosing mesenteritis: Secondary | ICD-10-CM

## 2023-12-11 DIAGNOSIS — Z09 Encounter for follow-up examination after completed treatment for conditions other than malignant neoplasm: Secondary | ICD-10-CM

## 2023-12-11 NOTE — Patient Instructions (Signed)
 Excision of Skin Lesions, Care After The following information offers guidance on how to care for yourself after your procedure. Your health care provider may also give you more specific instructions. If you have problems or questions, contact your health care provider. What can I expect after the procedure? After your procedure, it is common to have: Soreness or mild pain. Some redness and swelling. Follow these instructions at home: Excision site care  Follow instructions from your health care provider about how to take care of your excision site. Make sure you: Wash your hands with soap and water for at least 20 seconds before and after you change your bandage (dressing). If soap and water are not available, use hand sanitizer. Change your dressing as told by your health care provider. Leave stitches (sutures), skin glue, or adhesive strips in place. These skin closures may need to stay in place for 2 weeks or longer. If adhesive strip edges start to loosen and curl up, you may trim the loose edges. Do not remove adhesive strips completely unless your health care provider tells you to do that. Check the excision area every day for signs of infection. Watch for: More redness, swelling, or pain. Fluid or blood. Warmth. Pus or a bad smell. Keep the site clean, dry, and protected for at least 48 hours. For bleeding, apply gentle but firm pressure to the area using a folded towel for 20 minutes. Do not take baths, swim, or use a hot tub until your health care provider approves. Ask your health care provider if you may take showers. You may only be allowed to take sponge baths. General instructions Take over-the-counter and prescription medicines only as told by your health care provider. Follow instructions from your health care provider about how to minimize scarring. Scarring should lessen over time. Avoid sun exposure until the area has healed. Use sunscreen to protect the area from the sun  after it has healed. Avoid high-impact exercise and activities until the sutures are removed or the area heals. Keep all follow-up visits. This is important. Contact a health care provider if: You have more redness, swelling, or pain around your excision site. You have fluid or blood coming from your excision site. Your excision site feels warm to the touch. You have pus or a bad smell coming from your excision site. You have a fever. You have pain that does not improve in 2-3 days after your procedure. Get help right away if: You have bleeding that does not stop with pressure or a dressing. Your wound opens up. Summary Take over-the-counter and prescription medicines only as told by your health care provider. Change your dressing as told by your health care provider. Contact a health care provider if you have redness, swelling, pain, or other signs of infection around your excision site. Keep all follow-up visits. This is important. This information is not intended to replace advice given to you by your health care provider. Make sure you discuss any questions you have with your health care provider. Document Revised: 02/06/2021 Document Reviewed: 02/06/2021 Elsevier Patient Education  2024 [employer].

## 2023-12-11 NOTE — Progress Notes (Signed)
 Patient status post excision of left abdominal wall mass.  She reports doing well.  She says that there was a small amount of drainage a couple of days ago from her incision but it is clear in nature.  She says that she still has twinges of pain but they are improving.  This only happens when she gets up or stretches the incision.  She denies any nausea or vomiting.  She is having normal bowel movements.  On exam left abdominal wall incision is healing well.  There is some glue that is peeling off.  There is no drainage at the moment.  There is no surrounding erythema.  The wound appears to be healing well.  Pathology listed below and discussed with patient.  FINAL DIAGNOSIS        1. Skin, abdominal wall mass, stitch marks short superior, double long lateral :       BENIGN FIBROADIPOSE TISSUE WITH ACUTE AND CHRONIC INFLAMMATION, ABSCESS       FORMATION, FIBROSIS AND SCARRING.       NEGATIVE FOR MALIGNANCY.        2. Soft tissue mass, simple excision, abdominal wall mass, additional superior margin :       FIBROADIPOSE TISSUE WITH DENSE FIBROSIS AND SCARRING.       NEGATIVE FOR MALIGNANCY.        Diagnosis Note : The case was peer-reviewed by Dr.Rubinas who agrees with the       interpretation.   Patient status post abdominal wall mass excision.  Doing well and no concern for postoperative complications.  She can follow-up with us  as needed.

## 2023-12-22 ENCOUNTER — Ambulatory Visit: Admission: RE | Admit: 2023-12-22 | Source: Ambulatory Visit

## 2024-01-05 ENCOUNTER — Ambulatory Visit: Admitting: Family

## 2024-01-05 VITALS — BP 130/80 | HR 67 | Temp 97.8°F | Ht 65.0 in | Wt 256.4 lb

## 2024-01-05 DIAGNOSIS — Z8639 Personal history of other endocrine, nutritional and metabolic disease: Secondary | ICD-10-CM | POA: Diagnosis not present

## 2024-01-05 DIAGNOSIS — F32A Depression, unspecified: Secondary | ICD-10-CM | POA: Diagnosis not present

## 2024-01-05 DIAGNOSIS — I10 Essential (primary) hypertension: Secondary | ICD-10-CM

## 2024-01-05 DIAGNOSIS — G620 Drug-induced polyneuropathy: Secondary | ICD-10-CM

## 2024-01-05 DIAGNOSIS — G4700 Insomnia, unspecified: Secondary | ICD-10-CM | POA: Diagnosis not present

## 2024-01-05 DIAGNOSIS — C20 Malignant neoplasm of rectum: Secondary | ICD-10-CM

## 2024-01-05 DIAGNOSIS — E538 Deficiency of other specified B group vitamins: Secondary | ICD-10-CM | POA: Diagnosis not present

## 2024-01-05 DIAGNOSIS — F419 Anxiety disorder, unspecified: Secondary | ICD-10-CM

## 2024-01-05 DIAGNOSIS — K760 Fatty (change of) liver, not elsewhere classified: Secondary | ICD-10-CM | POA: Diagnosis not present

## 2024-01-05 MED ORDER — ZOLPIDEM TARTRATE 10 MG PO TABS: 10.0000 mg | ORAL_TABLET | Freq: Every day | ORAL | 2 refills | Status: DC

## 2024-01-05 MED ORDER — LOSARTAN POTASSIUM 50 MG PO TABS: 75.0000 mg | ORAL_TABLET | Freq: Every day | ORAL | 3 refills | Status: DC

## 2024-01-05 MED ORDER — BUSPIRONE HCL 5 MG PO TABS: 5.0000 mg | ORAL_TABLET | Freq: Three times a day (TID) | ORAL | 1 refills | Status: DC | PRN

## 2024-01-05 MED ORDER — AMLODIPINE BESYLATE 10 MG PO TABS
10.0000 mg | ORAL_TABLET | Freq: Every day | ORAL | 3 refills | Status: AC
Start: 2024-01-05 — End: ?

## 2024-01-05 MED ORDER — GABAPENTIN 300 MG PO CAPS
ORAL_CAPSULE | ORAL | 5 refills | Status: DC
Start: 2024-01-05 — End: 2024-05-03

## 2024-01-05 NOTE — Progress Notes (Signed)
 Assessment & Plan:  Essential hypertension -     amLODIPine  Besylate; Take 1 tablet (10 mg total) by mouth daily. TAKE (1) TABLET DAILY.  Dispense: 90 tablet; Refill: 3  Rectal cancer (HCC)  Insomnia, unspecified type -     Zolpidem  Tartrate; Take 1 tablet (10 mg total) by mouth at bedtime. for sleep  Dispense: 30 tablet; Refill: 2  Primary hypertension Assessment & Plan: Chronic, stable.Continue losartan  75mg   daily,amlodipine  10 mg daily.    Orders: -     Losartan  Potassium; Take 1.5 tablets (75 mg total) by mouth daily.  Dispense: 135 tablet; Refill: 3  Hepatic steatosis Assessment & Plan: Pending ultrasound of liver.  Orders: -     US  ABDOMEN LIMITED RUQ (LIVER/GB); Future -     Basic metabolic panel with GFR  Anxiety and depression Assessment & Plan: Suboptimal control.  Discussed concern with use of benzodiazepine.  Previously been on Xanax .  We opted to start BuSpar 5 mg 3 times daily as needed.  Continue Lexapro  20 mg.   B12 deficiency -     B12 and Folate Panel  History of vitamin D  deficiency -     VITAMIN D  25 Hydroxy (Vit-D Deficiency, Fractures)  Drug-induced polyneuropathy (HCC) -     Gabapentin ; TAKE TWO CAPSULES BY MOUTH EVERY MORNING, ONE CAPSULE midday AND TWO CAPSULES EVERY EVENING  Dispense: 300 capsule; Refill: 5  Other orders -     busPIRone HCl; Take 1 tablet (5 mg total) by mouth 3 (three) times daily as needed.  Dispense: 60 tablet; Refill: 1     Return precautions given.   Risks, benefits, and alternatives of the medications and treatment plan prescribed today were discussed, and patient expressed understanding.   Education regarding symptom management and diagnosis given to patient on AVS either electronically or printed.  Return in about 3 months (around 04/06/2024).  Bascom Bossier, FNP  Subjective:    Patient ID: AHLAYAH TARKOWSKI, female    DOB: Nov 26, 1965, 58 y.o.   MRN: [id number]  CC: NASIYA PASCUAL is a 58 y.o. female who  presents today for follow up.   HPI: Complains of increased anxiety of late.  She is a caregiver for her mother.  Endorses stress with her adult children at times.  Denies increase in depression.  She has done quite well on Lexapro  20 mg a prefers to stay on this medication.  Denies suicidal ideation  She is compliant with amlodipine  10 mg daily, losartan  75 mg    Allergies: Lisinopril Current Outpatient Medications on File Prior to Visit  Medication Sig Dispense Refill   acetaminophen (TYLENOL) 500 MG tablet Take 500-1,000 mg by mouth every 6 (six) hours as needed (pain.).     albuterol  (VENTOLIN  HFA) 108 (90 Base) MCG/ACT inhaler Inhale 2 puffs into the lungs every 6 (six) hours as needed for wheezing or shortness of breath. 8 g 0   budesonide -formoterol  (SYMBICORT ) 160-4.5 MCG/ACT inhaler Inhale 2 puffs into the lungs 2 (two) times daily. (Patient taking differently: Inhale 2 puffs into the lungs 2 (two) times daily as needed (respiratory issues).) 1 each 3   diphenhydrAMINE (BENADRYL) 25 mg capsule Take 25-50 mg by mouth every 6 (six) hours as needed for allergies.     escitalopram  (LEXAPRO ) 20 MG tablet Take 1 tablet (20 mg total) by mouth daily. (Patient taking differently: Take 20 mg by mouth at bedtime.) 90 tablet 3   fluticasone  (FLONASE ) 50 MCG/ACT nasal spray Place 2 sprays into  both nostrils daily. (Patient taking differently: Place 2 sprays into both nostrils at bedtime.) 16 g 6   levothyroxine  (SYNTHROID ) 50 MCG tablet Take 1 tablet (50 mcg total) by mouth daily. 90 tablet 3   Vitamin D , Ergocalciferol , 50000 units CAPS Take 50,000 Units by mouth every Saturday.     Current Facility-Administered Medications on File Prior to Visit  Medication Dose Route Frequency Provider Last Rate Last Admin   heparin  lock flush 100 unit/mL  500 Units Intravenous Once Pandit, Sandeep, MD       sodium chloride  0.9 % injection 10 mL  10 mL Intravenous PRN Rufus Council, MD       sodium chloride   0.9 % injection 10 mL  10 mL Intravenous PRN Rufus Council, MD   10 mL at 11/28/14 1120   sodium chloride  flush (NS) 0.9 % injection 10 mL  10 mL Intravenous PRN Ardell Koller, NP   10 mL at 01/03/16 1413   sodium chloride  flush (NS) 0.9 % injection 10 mL  10 mL Intravenous PRN Finnegan, Timothy J, MD   10 mL at 11/15/16 1111   sodium chloride  flush (NS) 0.9 % injection 10 mL  10 mL Intravenous PRN Brahmanday, Govinda R, MD   10 mL at 11/24/17 1434    Review of Systems  Constitutional:  Negative for chills and fever.  Respiratory:  Negative for cough.   Cardiovascular:  Negative for chest pain and palpitations.  Gastrointestinal:  Negative for nausea and vomiting.  Psychiatric/Behavioral:  Negative for sleep disturbance and suicidal ideas. The patient is nervous/anxious.       Objective:    BP 130/80   Pulse 67   Temp 97.8 F (36.6 C) (Oral)   Ht 5' 5 (1.651 m)   Wt 256 lb 6.4 oz (116.3 kg)   LMP 12/11/2013   SpO2 96%   BMI 42.67 kg/m  BP Readings from Last 3 Encounters:  01/05/24 130/80  12/11/23 135/85  11/28/23 (!) 152/80   Wt Readings from Last 3 Encounters:  01/05/24 256 lb 6.4 oz (116.3 kg)  12/11/23 237 lb (107.5 kg)  11/28/23 257 lb (116.6 kg)      01/05/2024    8:10 AM 10/06/2023    9:31 AM 09/29/2023    9:01 AM  Depression screen PHQ 2/9  Decreased Interest 1 0 0  Down, Depressed, Hopeless 0 0 0  PHQ - 2 Score 1 0 0  Altered sleeping 2    Tired, decreased energy 3    Change in appetite 2    Feeling bad or failure about yourself  1    Trouble concentrating 0    Moving slowly or fidgety/restless 0    Suicidal thoughts 0    PHQ-9 Score 9    Difficult doing work/chores Somewhat difficult      Physical Exam

## 2024-01-05 NOTE — Assessment & Plan Note (Signed)
 Pending ultrasound of liver.

## 2024-01-05 NOTE — Assessment & Plan Note (Signed)
 Chronic, stable.Continue losartan  75mg   daily,amlodipine  10 mg daily.

## 2024-01-05 NOTE — Assessment & Plan Note (Signed)
 Suboptimal control.  Discussed concern with use of benzodiazepine.  Previously been on Xanax .  We opted to start BuSpar 5 mg 3 times daily as needed.  Continue Lexapro  20 mg.

## 2024-01-05 NOTE — Patient Instructions (Addendum)
 Please call  and schedule your 3D mammogram and /or bone density scan as we discussed.   [facility]  ( new location in 2023)  [address], [place], [place] [address]  [place]  [phone]   I have ultrasound of liver  Let us  know if you dont hear back within a week in regards to an appointment being scheduled.   So that you are aware, if you are [facility] MyChart user , please pay attention to your MyChart messages as you may receive a MyChart message with a phone number to call and schedule this test/appointment own your own from our referral coordinator. This is a new process so I do not want you to miss this message.  If you are not a MyChart user, you will receive a phone call.

## 2024-01-06 LAB — B12 AND FOLATE PANEL
Folate: 3.5 ng/mL (ref >3.0)
Vitamin B-12: 376 pg/mL (ref 232–1245)

## 2024-01-06 LAB — VITAMIN D 25 HYDROXY (VIT D DEFICIENCY, FRACTURES): Vit D, 25-Hydroxy: 24.9 ng/mL — ABNORMAL LOW (ref 30.0–100.0)

## 2024-01-06 LAB — BASIC METABOLIC PANEL WITH GFR
BUN/Creatinine Ratio: 18 (ref 9–23)
BUN: 16 mg/dL (ref 6–24)
CO2: 20 mmol/L (ref 20–29)
Calcium: 9 mg/dL (ref 8.7–10.2)
Chloride: 106 mmol/L (ref 96–106)
Creatinine, Ser: 0.9 mg/dL (ref 0.57–1.00)
Glucose: 102 mg/dL — ABNORMAL HIGH (ref 70–99)
Potassium: 4.3 mmol/L (ref 3.5–5.2)
Sodium: 141 mmol/L (ref 134–144)
eGFR: 74 mL/min/{1.73_m2} (ref >59)

## 2024-01-07 ENCOUNTER — Other Ambulatory Visit (HOSPITAL_COMMUNITY): Payer: Self-pay

## 2024-01-08 ENCOUNTER — Other Ambulatory Visit: Payer: Self-pay

## 2024-01-08 ENCOUNTER — Telehealth: Payer: Self-pay

## 2024-01-08 DIAGNOSIS — Z1231 Encounter for screening mammogram for malignant neoplasm of breast: Secondary | ICD-10-CM

## 2024-01-08 NOTE — Telephone Encounter (Signed)
 Left message to call the office back to find out if she is interested in a Mammogram and if sowhere so we can order it.

## 2024-01-08 NOTE — Telephone Encounter (Unsigned)
 Copied from CRM [id number]. Topic: Clinical - Request for Lab/Test Order >> Jan 08, 2024  1:37 PM [place] A wrote: Reason for CRM: Patient said yes she would like a Mammogram

## 2024-01-08 NOTE — Telephone Encounter (Signed)
 Order for Mammogram has been placed to [facility] pt has been notified

## 2024-01-13 ENCOUNTER — Ambulatory Visit: Payer: Self-pay | Admitting: Family

## 2024-01-16 ENCOUNTER — Telehealth: Payer: Self-pay | Admitting: Family

## 2024-01-16 NOTE — Telephone Encounter (Signed)
 Per chart review saw no encounter noted

## 2024-01-16 NOTE — Telephone Encounter (Signed)
 Spoke to pt also although no documentation just wanted to be sure no questions or anything. May have been by accident they called

## 2024-01-16 NOTE — Telephone Encounter (Signed)
 Copied from CRM [id number]. Topic: General - Call Back - No Documentation >> Jan 16, 2024 11:18 AM Tammie Robinson wrote: Reason for CRM: Patient says she missed a call from the office today but there's no documentation of outgoing call - please reach out again to patient if necessary

## 2024-02-19 ENCOUNTER — Other Ambulatory Visit: Payer: Self-pay | Admitting: Family

## 2024-02-19 ENCOUNTER — Encounter: Payer: Self-pay | Admitting: Family

## 2024-02-20 ENCOUNTER — Other Ambulatory Visit: Payer: Self-pay | Admitting: Family

## 2024-02-20 MED ORDER — BUSPIRONE HCL 10 MG PO TABS: 10.0000 mg | ORAL_TABLET | Freq: Three times a day (TID) | ORAL | 3 refills | Status: DC | PRN

## 2024-02-23 ENCOUNTER — Other Ambulatory Visit

## 2024-02-23 DIAGNOSIS — E890 Postprocedural hypothyroidism: Secondary | ICD-10-CM | POA: Diagnosis not present

## 2024-02-23 LAB — TSH: TSH: 8.76 m[IU]/L — ABNORMAL HIGH (ref 0.40–4.50)

## 2024-02-23 LAB — T4, FREE: Free T4: 1 ng/dL (ref 0.8–1.8)

## 2024-02-24 ENCOUNTER — Ambulatory Visit: Payer: Self-pay | Admitting: Endocrinology

## 2024-02-26 ENCOUNTER — Ambulatory Visit: Admitting: Endocrinology

## 2024-03-01 ENCOUNTER — Ambulatory Visit: Admitting: Endocrinology

## 2024-03-01 ENCOUNTER — Encounter: Payer: Self-pay | Admitting: Endocrinology

## 2024-03-01 VITALS — BP 130/82 | HR 60 | Resp 16 | Ht 65.0 in | Wt 257.2 lb

## 2024-03-01 DIAGNOSIS — Z8639 Personal history of other endocrine, nutritional and metabolic disease: Secondary | ICD-10-CM

## 2024-03-01 DIAGNOSIS — E890 Postprocedural hypothyroidism: Secondary | ICD-10-CM | POA: Diagnosis not present

## 2024-03-01 DIAGNOSIS — E041 Nontoxic single thyroid nodule: Secondary | ICD-10-CM | POA: Diagnosis not present

## 2024-03-01 MED ORDER — LEVOTHYROXINE SODIUM 88 MCG PO TABS: 88.0000 ug | ORAL_TABLET | Freq: Every day | ORAL | 3 refills | Status: AC

## 2024-03-01 NOTE — Progress Notes (Signed)
 Outpatient Endocrinology Note [place] Gibson Telleria, MD  03/01/24  Patient's Name: Tammie Robinson    DOB: 1965-12-11    MRN: [id number]  REASON OF VISIT: Follow-up for hypothyroidism  PCP: Dineen Rollene MATSU, FNP  HISTORY OF PRESENT ILLNESS:   Tammie Robinson is a 58 y.o. old female with past medical history as listed below is presented for a follow up for postablative hypothyroidism / thyroid  nodule.   Pertinent Thyroid  History: - Patient had history of hyperthyroidism, diagnosed in 2022 was treated with methimazole  initially 80 mg daily, and was gradually decreased, she had positive thyrotropin receptor antibody consistent with Graves' disease.  She was treated with RAI I-131 in December 2023 with 12.55 mCi.  Patient developed postablative hypothyroidism with TSH of 11 in March 2024 and started on levothyroxine  50 mcg daily. However in 5/24 her TSH was low and she was taking 50 mcg alternating with 25 mcg, decreased to 25 mcg daily in November 2024.  Levothyroxine  was increased to 37.5 mcg daily in February 2025 when elevated TSH of 7, increased to 50 mcg daily in April 2024 when TSH was 10.  # Thyroid  nodule -Initially diagnosed in October 2022 and subsequently benign biopsy on Afirma testing in March 2023.  Ultrasound in October 2022 showed borderline thyromegaly with findings suggestive of multinodular goiter.  Nodule #1 measuring 3.8 cm.   Thyroid  scan of 03/04/2022 showed the following : 24 hour I-123 uptake = 81.1% (normal 10-30%) Large cold RIGHT thyroid  nodule    Homogeneous increased tracer uptake in the thyroid  lobes bilaterally with markedly elevated 4 hour and 24 hour radio iodine uptakes.   Needle aspiration biopsy of right thyroid  nodule was benign by FNA with Afirma in March 2024, initial cytology was AUS.   Interval history Patient has been taking levothyroxine  50 mcg daily, she reports has been taking it in the morning and compliance.  She complains of fatigue and gradual  weight gain.  Denies constipation.  She does not take any vitamins from the time of taking levothyroxine .  TSH is still elevated 8.76 mildly improved from last visit.  She occasionally complains of itching of the skin, has been using cream provided by prior endocrinologist, does not recall the name. Asked to send medication name and will check into that.    Latest Reference Range & Units 02/23/24 08:42  TSH 0.40 - 4.50 mIU/L 8.76 (H)  T4,Free(Direct) 0.8 - 1.8 ng/dL 1.0  (H): Data is abnormally high  REVIEW OF SYSTEMS:  As per history of present illness.   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 Dellie   COLONOSCOPY WITH ESOPHAGOGASTRODUODENOSCOPY (EGD)  12/2013   Large rectal mass   COLONOSCOPY WITH PROPOFOL  N/A 01/26/2016   Procedure: COLONOSCOPY WITH PROPOFOL  through colostomy;  Surgeon: Rogelia Copping, MD;  Location: [facility];  Service: Endoscopy;  Laterality: N/A;  port-a-cath   COLONOSCOPY WITH PROPOFOL  N/A 11/29/2019   Procedure: COLONOSCOPY WITH PROPOFOL ;  Surgeon: Copping Rogelia, MD;  Location: [facility];  Service: Endoscopy;  Laterality: N/A;   ERCP  2012   EXCISION OF ABDOMINAL WALL TUMOR  N/A 11/28/2023   Procedure: EXCISION, ABDOMINAL WALL MASS;  Surgeon: Marinda Jayson KIDD, MD;  Location: [facility];  Service: General;  Laterality: N/A;   POLYPECTOMY  01/26/2016   Procedure: POLYPECTOMY;  Surgeon: Rogelia Copping, MD;  Location: [facility];  Service: Endoscopy;;   POLYPECTOMY N/A 11/29/2019   Procedure: POLYPECTOMY;  Surgeon: Copping Rogelia, MD;  Location: [facility];  Service: Endoscopy;  Laterality: N/A;   PORTA CATH REMOVAL N/A 01/28/2018   Procedure: PORTA CATH REMOVAL;  Surgeon: Marea Selinda RAMAN, MD;  Location: [facility];  Service: Cardiovascular;  Laterality: N/A;   PORTACATH PLACEMENT Right 12/2013   PROCTECTOMY  04/12/2014   [facility] - CA surgery - with colostomy   TOTAL ABDOMINAL HYSTERECTOMY W/ BILATERAL SALPINGOOPHORECTOMY  04/12/2014   [facility] with CA surgery;( M.Arnett 01/2017)   VAGINA RECONSTRUCTION SURGERY  04/12/2014   [facility] - transabdonimal rectus abdominus muscle reconstruction   VAGINAL DELIVERY     2, preterm labor    ALLERGIES: Allergies  Allergen Reactions   Lisinopril Hives and Rash    FAMILY HISTORY:  Family History  Problem Relation Age of Onset   Thyroid  disease Mother    Arthritis Mother    Stroke Mother    Hypertension Mother    Diabetes Mother    Arthritis Father    Heart disease Father 64       s/p CABG   Stroke Father    Hypertension Father    Alcohol abuse Maternal Grandmother    Diabetes Maternal Grandmother    Alcohol abuse Maternal Grandfather    Diabetes Maternal Grandfather    Breast cancer Paternal Grandmother    Cancer Paternal Grandmother        breast   Heart disease Paternal Grandfather    Breast cancer Paternal Aunt 42   Colon cancer Neg Hx     SOCIAL HISTORY: Social History   Socioeconomic History   Marital status: Married    Spouse name: Lonni   Number of children: 2   Years of education: Not on file   Highest education level: 12th grade  Occupational History   Not on file  Tobacco  Use   Smoking status: Never   Smokeless tobacco: Never  Vaping Use   Vaping status: Never Used  Substance and Sexual Activity   Alcohol use: No    Comment: rare - holidays   Drug use: No   Sexual activity: Not on file  Other Topics Concern   Not on file  Social History Narrative   Lives in [place] with husband, has 2 children, 1 in college at [employer].      One daughter with leukemia, in remission   Other daughter has DM I.          Work - Advertising account executive   Diet - regular   Exercise - none recently   Social Drivers of Corporate investment banker Strain: Low Risk  (  01/05/2024)   Overall Financial Resource Strain (CARDIA)    Difficulty of Paying Living Expenses: Not very hard  Food Insecurity: No Food Insecurity (01/05/2024)   Hunger Vital Sign    Worried About Running Out of Food in the Last Year: Never true    Ran Out of Food in the Last Year: Never true  Transportation Needs: No Transportation Needs (01/05/2024)   PRAPARE - Administrator, Civil Service (Medical): No    Lack of Transportation (Non-Medical): No  Physical Activity: Unknown (01/05/2024)   Exercise Vital Sign    Days of Exercise per Week: Patient declined    Minutes of Exercise per Session: Not on file  Stress: Stress Concern Present (01/05/2024)   [employer] of Occupational Health - Occupational Stress Questionnaire    Feeling of Stress: Rather much  Social Connections: Socially Isolated (01/05/2024)   Social Connection and Isolation Panel    Frequency of Communication with Friends and Family: Once a week    Frequency of Social Gatherings with Friends and Family: Once a week    Attends Religious Services: Never    Database administrator or Organizations: No    Attends Engineer, structural: Not on file    Marital Status: Married    MEDICATIONS:  Current Outpatient Medications  Medication Sig Dispense Refill   acetaminophen (TYLENOL) 500 MG tablet Take 500-1,000 mg by mouth  every 6 (six) hours as needed (pain.).     albuterol  (VENTOLIN  HFA) 108 (90 Base) MCG/ACT inhaler Inhale 2 puffs into the lungs every 6 (six) hours as needed for wheezing or shortness of breath. 8 g 0   amLODipine  (NORVASC ) 10 MG tablet Take 1 tablet (10 mg total) by mouth daily. TAKE (1) TABLET DAILY. 90 tablet 3   budesonide -formoterol  (SYMBICORT ) 160-4.5 MCG/ACT inhaler Inhale 2 puffs into the lungs 2 (two) times daily. 1 each 3   busPIRone  (BUSPAR ) 10 MG tablet Take 1 tablet (10 mg total) by mouth 3 (three) times daily as needed. 90 tablet 3   diphenhydrAMINE (BENADRYL) 25 mg capsule Take 25-50 mg by mouth every 6 (six) hours as needed for allergies.     escitalopram  (LEXAPRO ) 20 MG tablet Take 1 tablet (20 mg total) by mouth daily. 90 tablet 3   fluticasone  (FLONASE ) 50 MCG/ACT nasal spray Place 2 sprays into both nostrils daily. 16 g 6   gabapentin  (NEURONTIN ) 300 MG capsule TAKE TWO CAPSULES BY MOUTH EVERY MORNING, ONE CAPSULE midday AND TWO CAPSULES EVERY EVENING 300 capsule 5   losartan  (COZAAR ) 50 MG tablet Take 1.5 tablets (75 mg total) by mouth daily. 135 tablet 3   Vitamin D , Ergocalciferol , 50000 units CAPS Take 50,000 Units by mouth every Saturday.     zolpidem  (AMBIEN ) 10 MG tablet Take 1 tablet (10 mg total) by mouth at bedtime. for sleep 30 tablet 2   levothyroxine  (SYNTHROID ) 88 MCG tablet Take 1 tablet (88 mcg total) by mouth daily. 90 tablet 3   No current facility-administered medications for this visit.   Facility-Administered Medications Ordered in Other Visits  Medication Dose Route Frequency Provider Last Rate Last Admin   heparin  lock flush 100 unit/mL  500 Units Intravenous Once Pandit, Sandeep, MD       sodium chloride  0.9 % injection 10 mL  10 mL Intravenous PRN Marina Cruise, MD       sodium chloride  0.9 % injection 10 mL  10 mL Intravenous PRN Marina Cruise, MD   10 mL at  11/28/14 1120   sodium chloride  flush (NS) 0.9 % injection 10 mL  10 mL Intravenous PRN  Lionell Sonny FALCON, NP   10 mL at 01/03/16 1413   sodium chloride  flush (NS) 0.9 % injection 10 mL  10 mL Intravenous PRN Finnegan, Timothy J, MD   10 mL at 11/15/16 1111   sodium chloride  flush (NS) 0.9 % injection 10 mL  10 mL Intravenous PRN Brahmanday, Govinda R, MD   10 mL at 11/24/17 1434    PHYSICAL EXAM: Vitals:   03/01/24 1032  BP: 130/82  Pulse: 60  Resp: 16  SpO2: 97%  Weight: 257 lb 3.2 oz (116.7 kg)  Height: 5' 5 (1.651 m)    Body mass index is 42.8 kg/m.  Wt Readings from Last 3 Encounters:  03/01/24 257 lb 3.2 oz (116.7 kg)  01/05/24 256 lb 6.4 oz (116.3 kg)  12/11/23 237 lb (107.5 kg)    General: Well developed, well nourished female in no apparent distress.  HEENT: AT/[place], no external lesions. Hearing intact to the spoken word Neurologic: Alert, oriented, normal speech. DTR 2+ Psychiatric: Does not appear depressed or anxious Skin: Dry skin present. Trace pedal edema.   PERTINENT HISTORIC LABORATORY AND IMAGING STUDIES:  All pertinent laboratory results were reviewed. Please see HPI also for further details.   TSH  Date Value Ref Range Status  02/23/2024 8.76 (H) 0.40 - 4.50 mIU/L Final  11/12/2023 10.33 (H) 0.40 - 4.50 mIU/L Final  09/08/2023 7.66 (H) 0.40 - 4.50 mIU/L Final     ASSESSMENT / PLAN  1. Postablative hypothyroidism   2. Right thyroid  nodule   3. H/O Graves' disease      -Patient has postablative hypothyroidism treated with RAI I-131 ablation for Graves' disease in December 2023. -She is currently taking levothyroxine  50 mcg daily. -Right thyroid  nodule: cold nodule on nuclear scan status post FNA with AUS and Afirma benign in March 2024.  Last ultrasound in December 2022.  Will check ultrasound thyroid  in 1 to 2 years to monitor right thyroid  nodule. - Patient TSH is still elevated at 8.76.  She is clinically-mildly hypothyroid in the clinic today.  Plan: -Increase levothyroxine  from 50 mcg daily to 88 mcg daily. -Check thyroid   function test prior to follow-up visit in 2 months. - She needs close endocrinology follow-up.    Diagnoses and all orders for this visit:  Postablative hypothyroidism -     levothyroxine  (SYNTHROID ) 88 MCG tablet; Take 1 tablet (88 mcg total) by mouth daily. -     T4, free -     TSH  Right thyroid  nodule  H/O Graves' disease    DISPOSITION Follow up in clinic in 2 months suggested.  Labs prior to follow-up visit.  All questions answered and patient verbalized understanding of the plan.  [place]raq Nzinga Ferran, MD [facility] Endocrinology [facility] [address] [place], [address] [place], [place] [address] Phone # [phone]  At least part of this note was generated using voice recognition software. Inadvertent word errors may have occurred, which were not recognized during the proofreading process.   3

## 2024-03-02 ENCOUNTER — Encounter: Payer: Self-pay | Admitting: Endocrinology

## 2024-03-02 DIAGNOSIS — L299 Pruritus, unspecified: Secondary | ICD-10-CM

## 2024-03-03 MED ORDER — TRIAMCINOLONE ACETONIDE 0.1 % EX CREA: 1.0000 | TOPICAL_CREAM | CUTANEOUS | 0 refills | Status: DC | PRN

## 2024-03-03 NOTE — Telephone Encounter (Signed)
 Sent prescription

## 2024-03-23 ENCOUNTER — Ambulatory Visit
Admission: RE | Admit: 2024-03-23 | Discharge: 2024-03-23 | Disposition: A | Discharge status: Home / Self Care | Source: Ambulatory Visit | Attending: Family | Admitting: Family

## 2024-03-23 DIAGNOSIS — K7689 Other specified diseases of liver: Secondary | ICD-10-CM | POA: Diagnosis not present

## 2024-03-23 DIAGNOSIS — Z9049 Acquired absence of other specified parts of digestive tract: Secondary | ICD-10-CM | POA: Diagnosis not present

## 2024-03-23 DIAGNOSIS — K760 Fatty (change of) liver, not elsewhere classified: Secondary | ICD-10-CM | POA: Insufficient documentation

## 2024-03-23 DIAGNOSIS — R160 Hepatomegaly, not elsewhere classified: Secondary | ICD-10-CM | POA: Diagnosis not present

## 2024-04-02 ENCOUNTER — Other Ambulatory Visit: Payer: Self-pay

## 2024-04-02 DIAGNOSIS — L299 Pruritus, unspecified: Secondary | ICD-10-CM

## 2024-04-02 MED ORDER — TRIAMCINOLONE ACETONIDE 0.1 % EX CREA: 1.0000 | TOPICAL_CREAM | CUTANEOUS | 0 refills | Status: AC | PRN

## 2024-04-06 ENCOUNTER — Ambulatory Visit: Admitting: Family

## 2024-04-07 ENCOUNTER — Telehealth: Payer: Self-pay | Admitting: Family

## 2024-04-07 DIAGNOSIS — G4700 Insomnia, unspecified: Secondary | ICD-10-CM

## 2024-04-07 MED ORDER — ZOLPIDEM TARTRATE 10 MG PO TABS: 10.0000 mg | ORAL_TABLET | Freq: Every day | ORAL | 2 refills | Status: DC

## 2024-04-07 NOTE — Telephone Encounter (Signed)
Call pt   I have refilled your ambien  However I wanted to remind you that this is controlled substance.   In order for me to prescribe medication,  patients must be seen every 3 months.   Please make follow-up appointment this month for any further refills.    I looked up patient on  Controlled Substances Reporting System and saw no activity that raised concern of inappropriate use.    

## 2024-04-07 NOTE — Telephone Encounter (Signed)
 LVM to call back to inform pt of message below per Rollene

## 2024-04-08 NOTE — Telephone Encounter (Signed)
 LVM 2nd time to call back to inform pt of message below per Rollene

## 2024-04-09 NOTE — Telephone Encounter (Signed)
 LVM and sent pt my chart message as well to inform of message below per Rollene

## 2024-04-09 NOTE — Telephone Encounter (Signed)
 Noted

## 2024-04-09 NOTE — Telephone Encounter (Signed)
 PT HAS RESPONDED VIA MY CHART

## 2024-04-25 ENCOUNTER — Telehealth: Admitting: Family

## 2024-04-25 DIAGNOSIS — J069 Acute upper respiratory infection, unspecified: Secondary | ICD-10-CM

## 2024-04-25 MED ORDER — FLUTICASONE PROPIONATE 50 MCG/ACT NA SUSP: 2.0000 | Freq: Every day | NASAL | 6 refills | Status: AC

## 2024-04-25 MED ORDER — CETIRIZINE HCL 10 MG PO TABS: 10.0000 mg | ORAL_TABLET | Freq: Every day | ORAL | 1 refills | Status: AC

## 2024-04-25 MED ORDER — BENZONATATE 100 MG PO CAPS
100.0000 mg | ORAL_CAPSULE | Freq: Three times a day (TID) | ORAL | 0 refills | Status: AC | PRN
Start: 2024-04-25 — End: ?

## 2024-04-25 NOTE — Progress Notes (Signed)
 We are sorry you are not feeling well.  Here is how we plan to help!  Based on what you have shared with me, it looks like you may have a viral upper respiratory infection.  Upper respiratory infections are caused by a large number of viruses; however, rhinovirus is the most common cause.   Symptoms vary from person to person, with common symptoms including sore throat, cough, and fatigue or lack of energy.  A low-grade fever of up to 100.4 may present, but is often uncommon.  Symptoms vary however, and are closely related to a person's age or underlying illnesses.  The most common symptoms associated with an upper respiratory infection are nasal discharge or congestion, cough, sneezing, headache and pressure in the ears and face.  These symptoms usually persist for about 3 to 10 days, but can last up to 2 weeks.  It is important to know that upper respiratory infections do not cause serious illness or complications in most cases.    Upper respiratory infections can be transmitted from person to person, with the most common method of transmission being a person's hands.  The virus is able to live on the skin and can infect other persons for up to 2 hours after direct contact.  Also, these can be transmitted when someone coughs or sneezes; thus, it is important to cover the mouth to reduce this risk.  To keep the spread of the illness at bay, good hand hygiene is very important.  This is an infection that is most likely caused by a virus. There are no specific treatments other than to help you with the symptoms until the infection runs its course.  We are sorry you are not feeling well.  Here is how we plan to help!   For nasal congestion, you may use an oral decongestants such as Mucinex  D or if you have glaucoma or high blood pressure use plain Mucinex .  Saline nasal spray or nasal drops can help and can safely be used as often as needed for congestion.  For your congestion, I have prescribed Fluticasone   nasal spray one spray in each nostril twice a day and zyrtec 10 mg.   If you do not have a history of heart disease, hypertension, diabetes or thyroid  disease, prostate/bladder issues or glaucoma, you may also use Sudafed to treat nasal congestion.  It is highly recommended that you consult with a pharmacist or your primary care physician to ensure this medication is safe for you to take.     If you have a cough, you may use cough suppressants such as Delsym and Robitussin.  If you have glaucoma or high blood pressure, you can also use Coricidin HBP.   For cough I have prescribed for you A prescription cough medication called Tessalon  Perles 100 mg. You may take 1-2 capsules every 8 hours as needed for cough  If you have a sore or scratchy throat, use a saltwater gargle-  to  teaspoon of salt dissolved in a 4-ounce to 8-ounce glass of warm water .  Gargle the solution for approximately 15-30 seconds and then spit.  It is important not to swallow the solution.  You can also use throat lozenges/cough drops and Chloraseptic spray to help with throat pain or discomfort.  Warm or cold liquids can also be helpful in relieving throat pain.  For headache, pain or general discomfort, you can use Ibuprofen or Tylenol as directed.   Some authorities believe that zinc sprays or the use  of Echinacea may shorten the course of your symptoms.   HOME CARE Only take medications as instructed by your medical team. Be sure to drink plenty of fluids. Water  is fine as well as fruit juices, sodas and electrolyte beverages. You may want to stay away from caffeine or alcohol. If you are nauseated, try taking small sips of liquids. How do you know if you are getting enough fluid? Your urine should be a pale yellow or almost colorless. Get rest. Taking a steamy shower or using a humidifier may help nasal congestion and ease sore throat pain. You can place a towel over your head and breathe in the steam from hot water  coming  from a faucet. Using a saline nasal spray works much the same way. Cough drops, hard candies and sore throat lozenges may ease your cough. Avoid close contacts especially the very young and the elderly Cover your mouth if you cough or sneeze Always remember to wash your hands.   GET HELP RIGHT AWAY IF: You develop worsening fever. If your symptoms do not improve within 10 days You develop yellow or green discharge from your nose over 3 days. You have coughing fits You develop a severe head ache or visual changes. You develop shortness of breath, difficulty breathing or start having chest pain Your symptoms persist after you have completed your treatment plan  MAKE SURE YOU  Understand these instructions. Will watch your condition. Will get help right away if you are not doing well or get worse.  Your e-visit answers were reviewed by a board certified advanced clinical practitioner to complete your personal care plan. Depending upon the condition, your plan could have included both over the counter or prescription medications. Please review your pharmacy choice. If there is a problem, you may call our nursing hot line at and have the prescription routed to another pharmacy. Your safety is important to us . If you have drug allergies check your prescription carefully.   You can use MyChart to ask questions about today's visit, request a non-urgent call back, or ask for a work or school excuse for 24 hours related to this e-Visit. If it has been greater than 24 hours you will need to follow up with your provider, or enter a new e-Visit to address those concerns. You will get an e-mail in the next two days asking about your experience.  I hope that your e-visit has been valuable and will speed your recovery. Thank you for using e-visits.   I have spent 5 minutes in review of e-visit questionnaire, review and updating patient chart, medical decision making and response to patient.   Bari Learn, FNP

## 2024-04-25 NOTE — Progress Notes (Signed)
Approximately 5 minutes was spent documenting and reviewing patient's chart.

## 2024-04-26 ENCOUNTER — Encounter: Payer: Self-pay | Admitting: Family

## 2024-04-29 ENCOUNTER — Ambulatory Visit: Admitting: Nurse Practitioner

## 2024-04-30 ENCOUNTER — Ambulatory Visit

## 2024-04-30 VITALS — BP 148/77 | HR 67 | Temp 98.6°F | Resp 16 | Ht 65.0 in | Wt 257.2 lb

## 2024-04-30 DIAGNOSIS — J4541 Moderate persistent asthma with (acute) exacerbation: Secondary | ICD-10-CM | POA: Diagnosis not present

## 2024-04-30 MED ORDER — BUDESONIDE-FORMOTEROL FUMARATE 160-4.5 MCG/ACT IN AERO: 2.0000 | INHALATION_SPRAY | Freq: Two times a day (BID) | RESPIRATORY_TRACT | 3 refills | Status: AC

## 2024-04-30 MED ORDER — PREDNISONE 20 MG PO TABS: 40.0000 mg | ORAL_TABLET | Freq: Every day | ORAL | 0 refills | Status: AC

## 2024-04-30 MED ORDER — ALBUTEROL SULFATE HFA 108 (90 BASE) MCG/ACT IN AERS
2.0000 | INHALATION_SPRAY | RESPIRATORY_TRACT | 3 refills | Status: AC | PRN
Start: 2024-04-30 — End: ?

## 2024-04-30 NOTE — Patient Instructions (Addendum)
 I recommend Delsym cough syrup.

## 2024-04-30 NOTE — Progress Notes (Signed)
 Acute visit   Patient: Tammie Robinson   DOB: 04/08/1966   58 y.o. Female  MRN: [id number] PCP: Dineen Rollene MATSU, FNP   Chief Complaint  Patient presents with   URI    Patient reports URI symptoms since last Thursday. Sore throat is better.     Subjective    Discussed the use of AI scribe software for clinical note transcription with the patient, who gave verbal consent to proceed.  History of Present Illness Tammie Robinson is a 58 year old female with asthma who presents with persistent cough and congestion.  She has been experiencing symptoms since last Thursday, which she believes were contracted from her three-year-old grandson who attends play school. Initially, she sought care through an E-visit on Sunday and was prescribed Zyrtec and Tessalon  Perles for her cough, but these medications have not provided relief.  Her cough is persistent, dry, and worsens at night, sometimes becoming so severe that it almost induces vomiting. There is no sputum production, but she experiences nasal congestion with occasional green discharge. She has been using Mucinex , which she feels exacerbates her cough.  She has a history of asthma, which typically worsens with colds, leading to chest symptoms. She uses an albuterol  rescue inhaler, usually once in the morning and before bed, but notes it has not been effective recently. She has not experienced a recent asthma exacerbation and does not take daily asthma medication. She previously used a Symbicort  inhaler but has misplaced it.  No hx of diabetes is reported, but she mentions having Graves' disease. She has been using an incentive spirometer from a past surgery to help manage her symptoms.    Review of systems as noted in HPI.   Objective    BP (!) 148/77 (BP Location: Left Arm, Patient Position: Sitting, Cuff Size: Large)   Pulse 67   Temp 98.6 F (37 C) (Oral)   Resp 16   Ht 5' 5 (1.651 m)   Wt 257 lb 3.2 oz (116.7 kg)   LMP  12/11/2013   SpO2 99%   BMI 42.80 kg/m  Physical Exam Constitutional:      Appearance: Normal appearance.  HENT:     Head: Normocephalic and atraumatic.     Mouth/Throat:     Mouth: Mucous membranes are moist.  Eyes:     Pupils: Pupils are equal, round, and reactive to light.  Cardiovascular:     Rate and Rhythm: Normal rate and regular rhythm.     Heart sounds: Normal heart sounds.  Pulmonary:     Effort: Pulmonary effort is normal.     Breath sounds: Wheezing present.     Comments: Mild expiratory wheezing in lower lung fields Skin:    General: Skin is warm.  Neurological:     General: No focal deficit present.     Mental Status: She is alert.       No results found for any visits on 04/30/24.  Assessment & Plan     Problem List Items Addressed This Visit       Respiratory   Moderate persistent asthma with exacerbation - Primary   Relevant Medications   predniSONE  (DELTASONE ) 20 MG tablet   budesonide -formoterol  (SYMBICORT ) 160-4.5 MCG/ACT inhaler   albuterol  (VENTOLIN  HFA) 108 (90 Base) MCG/ACT inhaler   Assessment & Plan Asthma exacerbation Asthma exacerbation likely due to viral illness, with persistent cough and wheezing. Has not been using her Symbicort  inhaler.  - Prescribed prednisone  40  mg daily for 5 days. - Refilled Symbicort  inhaler, advised twice daily use. - Refilled albuterol  inhaler for rescue use. - Advised Delsym for cough relief - Encouraged increased fluid intake and rest.    Meds ordered this encounter  Medications   predniSONE  (DELTASONE ) 20 MG tablet    Sig: Take 2 tablets (40 mg total) by mouth daily with breakfast for 5 days.    Dispense:  10 tablet    Refill:  0   budesonide -formoterol  (SYMBICORT ) 160-4.5 MCG/ACT inhaler    Sig: Inhale 2 puffs into the lungs 2 (two) times daily.    Dispense:  1 each    Refill:  3   albuterol  (VENTOLIN  HFA) 108 (90 Base) MCG/ACT inhaler    Sig: Inhale 2 puffs into the lungs every 4 (four)  hours as needed for wheezing or shortness of breath.    Dispense:  8 g    Refill:  3     No follow-ups on file.      Isaiah DELENA Pepper, MD  [facility] [phone] (phone) [phone] (fax)

## 2024-05-03 ENCOUNTER — Ambulatory Visit: Payer: Self-pay | Admitting: Family

## 2024-05-03 ENCOUNTER — Ambulatory Visit: Admitting: Family

## 2024-05-03 VITALS — BP 130/76 | HR 79 | Temp 97.9°F | Ht 65.0 in | Wt 259.6 lb

## 2024-05-03 DIAGNOSIS — G4700 Insomnia, unspecified: Secondary | ICD-10-CM

## 2024-05-03 DIAGNOSIS — F32A Depression, unspecified: Secondary | ICD-10-CM

## 2024-05-03 DIAGNOSIS — G620 Drug-induced polyneuropathy: Secondary | ICD-10-CM

## 2024-05-03 DIAGNOSIS — F419 Anxiety disorder, unspecified: Secondary | ICD-10-CM

## 2024-05-03 DIAGNOSIS — I10 Essential (primary) hypertension: Secondary | ICD-10-CM | POA: Diagnosis not present

## 2024-05-03 DIAGNOSIS — J40 Bronchitis, not specified as acute or chronic: Secondary | ICD-10-CM

## 2024-05-03 LAB — COMPREHENSIVE METABOLIC PANEL WITH GFR
ALT: 22 U/L (ref 0–35)
AST: 22 U/L (ref 0–37)
Albumin: 4 g/dL (ref 3.5–5.2)
Alkaline Phosphatase: 62 U/L (ref 39–117)
BUN: 17 mg/dL (ref 6–23)
CO2: 29 meq/L (ref 19–32)
Calcium: 8.7 mg/dL (ref 8.4–10.5)
Chloride: 105 meq/L (ref 96–112)
Creatinine, Ser: 0.96 mg/dL (ref 0.40–1.20)
GFR: 65.06 mL/min (ref >60.00)
Glucose, Bld: 97 mg/dL (ref 70–99)
Potassium: 4.3 meq/L (ref 3.5–5.1)
Sodium: 141 meq/L (ref 135–145)
Total Bilirubin: 0.4 mg/dL (ref 0.2–1.2)
Total Protein: 6.6 g/dL (ref 6.0–8.3)

## 2024-05-03 MED ORDER — ESCITALOPRAM OXALATE 20 MG PO TABS: 20.0000 mg | ORAL_TABLET | Freq: Every day | ORAL | 3 refills | Status: AC

## 2024-05-03 MED ORDER — ZOLPIDEM TARTRATE 10 MG PO TABS: 10.0000 mg | ORAL_TABLET | Freq: Every day | ORAL | 2 refills | Status: AC

## 2024-05-03 MED ORDER — GABAPENTIN 300 MG PO CAPS
ORAL_CAPSULE | ORAL | 5 refills | Status: AC
Start: 2024-05-03 — End: ?

## 2024-05-03 MED ORDER — LOSARTAN POTASSIUM 50 MG PO TABS
75.0000 mg | ORAL_TABLET | Freq: Every day | ORAL | 3 refills | Status: AC
Start: 2024-05-03 — End: ?

## 2024-05-03 NOTE — Assessment & Plan Note (Signed)
 Chronic, stable.Continue losartan  75mg   daily,amlodipine  10 mg daily.

## 2024-05-03 NOTE — Patient Instructions (Signed)
Please let me know if vaginal bleeding persists   please call  and schedule your 3D mammogram and /or bone density scan as we discussed.   [facility]  ( new location in 2023)  [address], [place], [place] [address]  [place], [place]  [phone]   I have ordered transvaginal ultrasound.  Let us know if you dont hear back within a week in regards to an appointment being scheduled.   So that you are aware, if you are [facility] MyChart user , please pay attention to your MyChart messages as you may receive a MyChart message with a phone number to call and schedule this test/appointment own your own from our referral coordinator. This is a new process so I do not want you to miss this message.  If you are not a MyChart user, you will receive a phone call.   

## 2024-05-03 NOTE — Assessment & Plan Note (Signed)
Chronic, stable.  Continue Lexapro 20 mg

## 2024-05-03 NOTE — Progress Notes (Signed)
 Assessment & Plan:  Bronchitis Assessment & Plan: Improved. She will complete prednisone , continue symbicort . She will let me know if symptoms do not completely resolve.    Anxiety and depression Assessment & Plan: Chronic, stable. Continue Lexapro  20 mg.  Orders: -     Escitalopram  Oxalate; Take 1 tablet (20 mg total) by mouth daily.  Dispense: 90 tablet; Refill: 3  Drug-induced polyneuropathy -     Gabapentin ; TAKE TWO CAPSULES BY MOUTH EVERY MORNING, ONE CAPSULE midday AND TWO CAPSULES EVERY EVENING  Dispense: 300 capsule; Refill: 5  Primary hypertension Assessment & Plan: Chronic, stable.Continue losartan  75mg   daily,amlodipine  10 mg daily.    Orders: -     Losartan  Potassium; Take 1.5 tablets (75 mg total) by mouth daily.  Dispense: 135 tablet; Refill: 3 -     Comprehensive metabolic panel with GFR  Insomnia, unspecified type -     Zolpidem  Tartrate; Take 1 tablet (10 mg total) by mouth at bedtime. for sleep  Dispense: 30 tablet; Refill: 2     Return precautions given.   Risks, benefits, and alternatives of the medications and treatment plan prescribed today were discussed, and patient expressed understanding.   Education regarding symptom management and diagnosis given to patient on AVS either electronically or printed.  No follow-ups on file.  Rollene Northern, FNP  Subjective:    Patient ID: Tammie Robinson, female    DOB: 09-08-1965, 58 y.o.   MRN: [id number]  CC: Tammie Robinson is a 58 y.o. female who presents today for follow up.   HPI: She is doing well on ambien . Requests refill.   Cough is improved. Dry cough which started 10 days ago.   She is using deslym, symbiort.   Denies sob, wheezing, fever, CP.   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     albuterol  (VENTOLIN  HFA) 108 (90 Base) MCG/ACT inhaler Inhale 2 puffs into the lungs every 4 (four) hours as needed for wheezing or shortness of breath. 8 g 3   amLODipine  (NORVASC ) 10 MG tablet Take 1 tablet (10 mg total) by mouth daily. TAKE (1) TABLET DAILY. 90 tablet 3   benzonatate  (TESSALON  PERLES) 100 MG capsule Take 1 capsule (100 mg total) by mouth 3 (three) times daily as needed. 20 capsule 0   budesonide -formoterol  (SYMBICORT ) 160-4.5 MCG/ACT inhaler Inhale 2 puffs into the lungs 2 (two) times daily. 1 each 3   busPIRone  (BUSPAR ) 10 MG tablet Take 1 tablet (10 mg total) by mouth 3 (three) times daily as needed. 90 tablet 3   cetirizine (ZYRTEC ALLERGY) 10 MG tablet Take 1 tablet (10 mg total) by mouth daily. 90 tablet 1   diphenhydrAMINE (BENADRYL) 25 mg capsule Take 25-50 mg by mouth every 6 (six) hours as needed for allergies.     fluticasone  (FLONASE ) 50 MCG/ACT nasal spray Place 2 sprays into both nostrils daily. 16 g 6   levothyroxine  (SYNTHROID ) 88 MCG tablet Take 1 tablet (88 mcg total) by mouth daily. 90 tablet 3   predniSONE  (DELTASONE ) 20 MG tablet Take 2 tablets (40 mg total) by mouth daily with breakfast for 5 days. 10 tablet 0   triamcinolone  cream (KENALOG ) 0.1 % Apply 1 Application topically as needed. For  itching. 30 g 0   Current Facility-Administered Medications on File Prior to Visit  Medication Dose Route Frequency Provider Last Rate Last Admin   heparin  lock flush 100 unit/mL  500 Units Intravenous Once Pandit, Sandeep, MD       sodium chloride  0.9 % injection 10 mL  10 mL Intravenous PRN Marina Cruise, MD       sodium chloride  0.9 % injection 10 mL  10 mL Intravenous PRN Marina Cruise, MD   10 mL at 11/28/14 1120   sodium chloride  flush (NS) 0.9 % injection 10 mL  10 mL Intravenous PRN Lionell Sonny FALCON, NP   10 mL at 01/03/16 1413   sodium chloride  flush (NS) 0.9 % injection 10 mL  10 mL  Intravenous PRN Finnegan, Timothy J, MD   10 mL at 11/15/16 1111   sodium chloride  flush (NS) 0.9 % injection 10 mL  10 mL Intravenous PRN Brahmanday, Govinda R, MD   10 mL at 11/24/17 1434    Review of Systems  Constitutional:  Negative for chills and fever.  Respiratory:  Negative for cough.   Cardiovascular:  Negative for chest pain and palpitations.  Gastrointestinal:  Negative for nausea and vomiting.      Objective:    BP 130/76   Pulse 79   Temp 97.9 F (36.6 C) (Oral)   Ht 5' 5 (1.651 m)   Wt 259 lb 9.6 oz (117.8 kg)   LMP 12/11/2013   SpO2 98%   BMI 43.20 kg/m  BP Readings from Last 3 Encounters:  05/03/24 130/76  04/30/24 (!) 148/77  03/01/24 130/82   Wt Readings from Last 3 Encounters:  05/03/24 259 lb 9.6 oz (117.8 kg)  04/30/24 257 lb 3.2 oz (116.7 kg)  03/01/24 257 lb 3.2 oz (116.7 kg)    Physical Exam Vitals reviewed.  Constitutional:      Appearance: She is well-developed.  Eyes:     Conjunctiva/sclera: Conjunctivae normal.  Cardiovascular:     Rate and Rhythm: Normal rate and regular rhythm.     Pulses: Normal pulses.     Heart sounds: Normal heart sounds.  Pulmonary:     Effort: Pulmonary effort is normal.     Breath sounds: Normal breath sounds. No wheezing, rhonchi or rales.  Skin:    General: Skin is warm and dry.  Neurological:     Mental Status: She is alert.  Psychiatric:        Speech: Speech normal.        Behavior: Behavior normal.        Thought Content: Thought content normal.

## 2024-05-03 NOTE — Assessment & Plan Note (Signed)
 Improved. She will complete prednisone , continue symbicort . She will let me know if symptoms do not completely resolve.

## 2024-05-06 ENCOUNTER — Other Ambulatory Visit

## 2024-05-06 DIAGNOSIS — E890 Postprocedural hypothyroidism: Secondary | ICD-10-CM | POA: Diagnosis not present

## 2024-05-07 ENCOUNTER — Ambulatory Visit: Payer: Self-pay | Admitting: Endocrinology

## 2024-05-07 LAB — T4, FREE: Free T4: 1.3 ng/dL (ref 0.8–1.8)

## 2024-05-07 LAB — TSH: TSH: 1.07 m[IU]/L (ref 0.40–4.50)

## 2024-05-11 ENCOUNTER — Other Ambulatory Visit

## 2024-05-11 ENCOUNTER — Telehealth (INDEPENDENT_AMBULATORY_CARE_PROVIDER_SITE_OTHER): Admitting: Endocrinology

## 2024-05-11 ENCOUNTER — Encounter: Payer: Self-pay | Admitting: Endocrinology

## 2024-05-11 DIAGNOSIS — E890 Postprocedural hypothyroidism: Secondary | ICD-10-CM

## 2024-05-11 DIAGNOSIS — Z8639 Personal history of other endocrine, nutritional and metabolic disease: Secondary | ICD-10-CM | POA: Diagnosis not present

## 2024-05-11 DIAGNOSIS — E041 Nontoxic single thyroid nodule: Secondary | ICD-10-CM

## 2024-05-11 NOTE — Progress Notes (Signed)
 Outpatient Endocrinology Note Tammie Konya Fauble, MD  05/11/24  Patient's Name: Tammie Robinson    DOB: 08/06/1965    MRN: [id number]  I connected with  Tammie Robinson on 05/11/24 by a video enabled telemedicine application and verified that I am speaking with the correct person using two identifiers.   I discussed the limitations of evaluation and management by telemedicine. The patient expressed understanding and agreed to proceed.   Provider location office Patient location Home Reason for MyChart video visit patient ill with bronchitis.  REASON OF VISIT: Follow-up for hypothyroidism  PCP: Dineen Rollene MATSU, FNP  HISTORY OF PRESENT ILLNESS:   Tammie Robinson is a 58 y.o. old female with past medical history as listed below is presented for a follow up for postablative hypothyroidism / thyroid  nodule.   Pertinent Thyroid  History: - Patient had history of hyperthyroidism, diagnosed in 2022 was treated with methimazole  initially 80 mg daily, and was gradually decreased, she had positive thyrotropin receptor antibody consistent with Graves' disease.  She was treated with RAI I-131 in December 2023 with 12.55 mCi.  Patient developed postablative hypothyroidism with TSH of 11 in March 2024 and started on levothyroxine  50 mcg daily. However in 5/24 her TSH was low and she was taking 50 mcg alternating with 25 mcg, decreased to 25 mcg daily in November 2024.  Levothyroxine  was increased to 37.5 mcg daily in February 2025 when elevated TSH of 7, increased to 50 mcg daily in April 2024 when TSH was 10.  Levothyroxine  was increased to 88 in August 2025 due to continued elevated TSH.  # Thyroid  nodule -Initially diagnosed in October 2022 and subsequently benign biopsy on Afirma testing in March 2023.  Ultrasound in October 2022 showed borderline thyromegaly with findings suggestive of multinodular goiter.  Nodule #1 measuring 3.8 cm.   Thyroid  scan of 03/04/2022 showed the following : 24 hour I-123  uptake = 81.1% (normal 10-30%) Large cold RIGHT thyroid  nodule    Homogeneous increased tracer uptake in the thyroid  lobes bilaterally with markedly elevated 4 hour and 24 hour radio iodine uptakes.   Needle aspiration biopsy of right thyroid  nodule was benign by FNA with Afirma in March 2024, initial cytology was AUS.   Interval history Patient has been taking levothyroxine  88 mcg daily.  She has been taking in the morning before breakfast and reports compliance.  She denies palpitation or heat intolerance.  She has normalized her thyroid  function test as follows.  She reports her energy levels has been gradually improving.  She is having bronchitis and not able to come for in person visit, completed MyChart video visit today.  No other complaints today.   Latest Reference Range & Units 05/06/24 08:52  TSH 0.40 - 4.50 mIU/L 1.07  T4,Free(Direct) 0.8 - 1.8 ng/dL 1.3   REVIEW OF SYSTEMS:  As per history of present illness.   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 Dellie   COLON SURGERY  04/12/2014   Remove tumor and permanent colostomy bag   COLONOSCOPY WITH ESOPHAGOGASTRODUODENOSCOPY (EGD)  12/2013   Large rectal mass   COLONOSCOPY WITH PROPOFOL  N/A 01/26/2016   Procedure: COLONOSCOPY WITH PROPOFOL  through colostomy;  Surgeon: Rogelia Copping, MD;  Location: [facility];  Service: Endoscopy;  Laterality: N/A;  port-a-cath   COLONOSCOPY WITH PROPOFOL  N/A 11/29/2019   Procedure: COLONOSCOPY WITH PROPOFOL ;  Surgeon: Copping Rogelia, MD;  Location: [facility];  Service: Endoscopy;  Laterality: N/A;   ERCP  2012   EXCISION OF ABDOMINAL WALL TUMOR N/A 11/28/2023   Procedure: EXCISION, ABDOMINAL WALL MASS;  Surgeon: Marinda Jayson KIDD, MD;  Location: [facility] ORS;  Service: General;  Laterality: N/A;   POLYPECTOMY  01/26/2016   Procedure: POLYPECTOMY;  Surgeon: Rogelia Copping, MD;  Location: [facility];  Service: Endoscopy;;   POLYPECTOMY N/A 11/29/2019   Procedure: POLYPECTOMY;  Surgeon: Copping Rogelia, MD;  Location: [facility];  Service: Endoscopy;  Laterality: N/A;   PORTA CATH REMOVAL N/A 01/28/2018   Procedure: PORTA CATH REMOVAL;  Surgeon: Marea Selinda RAMAN, MD;  Location: [facility] INVASIVE CV LAB;  Service: Cardiovascular;  Laterality: N/A;   PORTACATH PLACEMENT Right 12/2013   PROCTECTOMY  04/12/2014   [facility] - CA surgery - with colostomy   TOTAL ABDOMINAL HYSTERECTOMY W/ BILATERAL SALPINGOOPHORECTOMY  04/12/2014   [facility] with CA surgery;( M.Arnett 01/2017)   VAGINA RECONSTRUCTION SURGERY  04/12/2014   [facility] - transabdonimal rectus abdominus muscle reconstruction   VAGINAL DELIVERY     2, preterm labor    ALLERGIES: Allergies  Allergen Reactions   Lisinopril Hives and Rash    FAMILY HISTORY:  Family History  Problem Relation Age of Onset   Thyroid  disease Mother     Arthritis Mother    Stroke Mother    Hypertension Mother    Diabetes Mother    Miscarriages / [place] Mother    Arthritis Father    Heart disease Father 22       s/p CABG   Stroke Father    Hypertension Father    Alcohol abuse Maternal Grandmother    Diabetes Maternal Grandmother    Alcohol abuse Maternal Grandfather    Diabetes Maternal Grandfather    Breast cancer Paternal Grandmother    Cancer Paternal Grandmother        breast   Alcohol abuse Paternal Grandmother    Heart disease Paternal Grandfather    Breast cancer Paternal Aunt 87   Cancer Paternal Aunt    Cancer Daughter    Diabetes Daughter    Colon cancer Neg Hx     SOCIAL HISTORY: Social History   Socioeconomic History   Marital status: Married  Spouse name: Lonni   Number of children: 2   Years of education: Not on file   Highest education level: 12th grade  Occupational History   Not on file  Tobacco Use   Smoking status: Never   Smokeless tobacco: Never  Vaping Use   Vaping status: Never Used  Substance and Sexual Activity   Alcohol use: No    Comment: rare - holidays   Drug use: No   Sexual activity: Not on file  Other Topics Concern   Not on file  Social History Narrative   Lives in [place] with husband, has 2 children, 1 in college at [employer].      One daughter with leukemia, in remission   Other daughter has DM I.          Work - Advertising account executive   Diet - regular   Exercise - none recently   Social Drivers of Corporate investment banker Strain: Low Risk  (05/03/2024)   Overall Financial Resource Strain (CARDIA)    Difficulty of Paying Living Expenses: Not hard at all  Food Insecurity: No Food Insecurity (05/03/2024)   Hunger Vital Sign    Worried About Running Out of Food in the Last Year: Never true    Ran Out of Food in the Last Year: Never true  Transportation Needs: No Transportation Needs (05/03/2024)   PRAPARE - Administrator, Civil Service  (Medical): No    Lack of Transportation (Non-Medical): No  Physical Activity: Insufficiently Active (05/03/2024)   Exercise Vital Sign    Days of Exercise per Week: 3 days    Minutes of Exercise per Session: 10 min  Stress: No Stress Concern Present (05/03/2024)   [employer] of Occupational Health - Occupational Stress Questionnaire    Feeling of Stress: Only a little  Social Connections: Moderately Integrated (05/03/2024)   Social Connection and Isolation Panel    Frequency of Communication with Friends and Family: More than three times a week    Frequency of Social Gatherings with Friends and Family: Once a week    Attends Religious Services: 1 to 4 times per year    Active Member of [employer] or Organizations: No    Attends Engineer, structural: Not on file    Marital Status: Married    MEDICATIONS:  Current Outpatient Medications  Medication Sig Dispense Refill   acetaminophen (TYLENOL) 500 MG tablet Take 500-1,000 mg by mouth every 6 (six) hours as needed (pain.).     albuterol  (VENTOLIN  HFA) 108 (90 Base) MCG/ACT inhaler Inhale 2 puffs into the lungs every 4 (four) hours as needed for wheezing or shortness of breath. 8 g 3   amLODipine  (NORVASC ) 10 MG tablet Take 1 tablet (10 mg total) by mouth daily. TAKE (1) TABLET DAILY. 90 tablet 3   benzonatate  (TESSALON  PERLES) 100 MG capsule Take 1 capsule (100 mg total) by mouth 3 (three) times daily as needed. 20 capsule 0   budesonide -formoterol  (SYMBICORT ) 160-4.5 MCG/ACT inhaler Inhale 2 puffs into the lungs 2 (two) times daily. 1 each 3   busPIRone  (BUSPAR ) 10 MG tablet Take 1 tablet (10 mg total) by mouth 3 (three) times daily as needed. 90 tablet 3   cetirizine (ZYRTEC ALLERGY) 10 MG tablet Take 1 tablet (10 mg total) by mouth daily. 90 tablet 1   diphenhydrAMINE (BENADRYL) 25 mg capsule Take 25-50 mg by mouth every 6 (six) hours as needed for allergies.     escitalopram  (LEXAPRO ) 20  MG tablet Take 1 tablet (20 mg total)  by mouth daily. 90 tablet 3   fluticasone  (FLONASE ) 50 MCG/ACT nasal spray Place 2 sprays into both nostrils daily. 16 g 6   gabapentin  (NEURONTIN ) 300 MG capsule TAKE TWO CAPSULES BY MOUTH EVERY MORNING, ONE CAPSULE midday AND TWO CAPSULES EVERY EVENING 300 capsule 5   levothyroxine  (SYNTHROID ) 88 MCG tablet Take 1 tablet (88 mcg total) by mouth daily. 90 tablet 3   losartan  (COZAAR ) 50 MG tablet Take 1.5 tablets (75 mg total) by mouth daily. 135 tablet 3   triamcinolone  cream (KENALOG ) 0.1 % Apply 1 Application topically as needed. For itching. 30 g 0   zolpidem  (AMBIEN ) 10 MG tablet Take 1 tablet (10 mg total) by mouth at bedtime. for sleep 30 tablet 2   No current facility-administered medications for this visit.   Facility-Administered Medications Ordered in Other Visits  Medication Dose Route Frequency Provider Last Rate Last Admin   heparin  lock flush 100 unit/mL  500 Units Intravenous Once Pandit, Sandeep, MD       sodium chloride  0.9 % injection 10 mL  10 mL Intravenous PRN Marina Cruise, MD       sodium chloride  0.9 % injection 10 mL  10 mL Intravenous PRN Marina Cruise, MD   10 mL at 11/28/14 1120   sodium chloride  flush (NS) 0.9 % injection 10 mL  10 mL Intravenous PRN Lionell Sonny FALCON, NP   10 mL at 01/03/16 1413   sodium chloride  flush (NS) 0.9 % injection 10 mL  10 mL Intravenous PRN Finnegan, Timothy J, MD   10 mL at 11/15/16 1111   sodium chloride  flush (NS) 0.9 % injection 10 mL  10 mL Intravenous PRN Brahmanday, Govinda R, MD   10 mL at 11/24/17 1434    PHYSICAL EXAM: There were no vitals filed for this visit.   There is no height or weight on file to calculate BMI.  Wt Readings from Last 3 Encounters:  05/03/24 259 lb 9.6 oz (117.8 kg)  04/30/24 257 lb 3.2 oz (116.7 kg)  03/01/24 257 lb 3.2 oz (116.7 kg)    General: Well developed, well nourished female in no apparent distress.  HEENT: AT/[place], no external lesions. Hearing intact to the spoken word Neurologic:  Alert, oriented, normal speech.  Psychiatric: Does not appear depressed or anxious  PERTINENT HISTORIC LABORATORY AND IMAGING STUDIES:  All pertinent laboratory results were reviewed. Please see HPI also for further details.   TSH  Date Value Ref Range Status  05/06/2024 1.07 0.40 - 4.50 mIU/L Final  02/23/2024 8.76 (H) 0.40 - 4.50 mIU/L Final  11/12/2023 10.33 (H) 0.40 - 4.50 mIU/L Final     ASSESSMENT / PLAN  1. Postablative hypothyroidism   2. Right thyroid  nodule   3. H/O Graves' disease     -Patient has postablative hypothyroidism treated with RAI I-131 ablation for Graves' disease in December 2023. -She is currently taking levothyroxine  88 mcg daily.  Dose was increased in August.  She has normalization of thyroid  function test. -Right thyroid  nodule: cold nodule on nuclear scan status post FNA with AUS and Afirma benign in March 2024.  Last ultrasound in December 2022.  Will check ultrasound thyroid  in 1 to 2 years to monitor right thyroid  nodule.  Plan: - Continue current dose of levothyroxine  88 mcg daily. -Check thyroid  function test prior to follow-up visit in 4 months. - She needs close endocrinology follow-up.    Diagnoses and all orders for  this visit:  Postablative hypothyroidism -     T4, free -     TSH  Right thyroid  nodule  H/O Graves' disease   DISPOSITION Follow up in clinic in 4 months suggested.  Labs prior to follow-up visit.  All questions answered and patient verbalized understanding of the plan.  Tammie Auston Halfmann, MD [facility] Endocrinology [facility] [address] [place], [address] [place], [place] [address] Phone # [phone]  At least part of this note was generated using voice recognition software. Inadvertent word errors may have occurred, which were not recognized during the proofreading process.   3

## 2024-06-07 ENCOUNTER — Other Ambulatory Visit: Payer: Self-pay | Admitting: Family

## 2024-07-04 ENCOUNTER — Other Ambulatory Visit: Payer: Self-pay | Admitting: Family

## 2024-07-08 ENCOUNTER — Telehealth: Admitting: Physician Assistant

## 2024-07-08 DIAGNOSIS — B9689 Other specified bacterial agents as the cause of diseases classified elsewhere: Secondary | ICD-10-CM

## 2024-07-08 DIAGNOSIS — J019 Acute sinusitis, unspecified: Secondary | ICD-10-CM

## 2024-07-08 MED ORDER — BENZONATATE 100 MG PO CAPS: 100.0000 mg | ORAL_CAPSULE | Freq: Three times a day (TID) | ORAL | 0 refills | Status: AC | PRN

## 2024-07-08 NOTE — Progress Notes (Signed)
 E-Visit for Sinus Problems  We are sorry that you are not feeling well.  Here is how we plan to help!  Based on what you have shared with me it looks like you have sinusitis.  Sinusitis is inflammation and infection in the sinus cavities of the head.  Based on your presentation I believe you most likely have Acute Bacterial Sinusitis.  This is an infection caused by bacteria and is treated with antibiotics. I have prescribed Doxycycline  100mg  by mouth twice a day for 7 days. and I have also prescribed Flonase  Nasal Spray Use 2 sprays in each nostril daily for 10-14 days Tessalon  Perles 100-200 mg up to three times daily as needed. You may use an oral decongestant such as Mucinex  D or if you have glaucoma or high blood pressure use plain Mucinex . Saline nasal spray help and can safely be used as often as needed for congestion.  If you develop worsening sinus pain, fever or notice severe headache and vision changes, or if symptoms are not better after completion of antibiotic, please schedule an appointment with a health care provider.    Sinus infections are not as easily transmitted as other respiratory infection, however we still recommend that you avoid close contact with loved ones, especially the very young and elderly.  Remember to wash your hands thoroughly throughout the day as this is the number one way to prevent the spread of infection!  Home Care: Only take medications as instructed by your medical team. Complete the entire course of an antibiotic. Do not take these medications with alcohol. A steam or ultrasonic humidifier can help congestion.  You can place a towel over your head and breathe in the steam from hot water  coming from a faucet. Avoid close contacts especially the very young and the elderly. Cover your mouth when you cough or sneeze. Always remember to wash your hands.  Get Help Right Away If: You develop worsening fever or sinus pain. You develop a severe head ache or  visual changes. Your symptoms persist after you have completed your treatment plan.  Make sure you Understand these instructions. Will watch your condition. Will get help right away if you are not doing well or get worse.  Your e-visit answers were reviewed by a board certified advanced clinical practitioner to complete your personal care plan.  Depending on the condition, your plan could have included both over the counter or prescription medications.  If there is a problem please reply  once you have received a response from your provider.  Your safety is important to us .  If you have drug allergies check your prescription carefully.    You can use MyChart to ask questions about todays visit, request a non-urgent call back, or ask for a work or school excuse for 24 hours related to this e-Visit. If it has been greater than 24 hours you will need to follow up with your provider, or enter a new e-Visit to address those concerns.  You will get an e-mail in the next two days asking about your experience.  I hope that your e-visit has been valuable and will speed your recovery. Thank you for using e-visits.  I have spent 5 minutes in review of e-visit questionnaire, review and updating patient chart, medical decision making and response to patient.   Chiquita CHRISTELLA Barefoot, NP

## 2024-08-01 ENCOUNTER — Other Ambulatory Visit: Payer: Self-pay | Admitting: Family

## 2024-08-03 ENCOUNTER — Ambulatory Visit: Admitting: Family

## 2024-08-17 ENCOUNTER — Ambulatory Visit: Admitting: Family

## 2024-09-01 ENCOUNTER — Ambulatory Visit: Admitting: Family

## 2024-09-09 ENCOUNTER — Other Ambulatory Visit

## 2024-09-14 ENCOUNTER — Ambulatory Visit: Admitting: Endocrinology
